# Patient Record
Sex: Male | Born: 1955
Health system: Southern US, Community
[De-identification: ages and names within clinical notes are randomized; demographics above are authoritative.]

## PROBLEM LIST (undated history)

## (undated) DIAGNOSIS — Z950 Presence of cardiac pacemaker: Secondary | ICD-10-CM

## (undated) DIAGNOSIS — Z95810 Presence of automatic (implantable) cardiac defibrillator: Secondary | ICD-10-CM

## (undated) DIAGNOSIS — I2510 Atherosclerotic heart disease of native coronary artery without angina pectoris: Secondary | ICD-10-CM

## (undated) DIAGNOSIS — E785 Hyperlipidemia, unspecified: Secondary | ICD-10-CM

## (undated) DIAGNOSIS — R7303 Prediabetes: Secondary | ICD-10-CM

## (undated) DIAGNOSIS — I480 Paroxysmal atrial fibrillation: Secondary | ICD-10-CM

## (undated) DIAGNOSIS — I5022 Chronic systolic (congestive) heart failure: Secondary | ICD-10-CM

## (undated) DIAGNOSIS — I671 Cerebral aneurysm, nonruptured: Secondary | ICD-10-CM

## (undated) DIAGNOSIS — I509 Heart failure, unspecified: Secondary | ICD-10-CM

## (undated) DIAGNOSIS — I4891 Unspecified atrial fibrillation: Secondary | ICD-10-CM

## (undated) DIAGNOSIS — G4730 Sleep apnea, unspecified: Secondary | ICD-10-CM

## (undated) DIAGNOSIS — M109 Gout, unspecified: Secondary | ICD-10-CM

## (undated) DIAGNOSIS — E119 Type 2 diabetes mellitus without complications: Secondary | ICD-10-CM

## (undated) HISTORY — DX: Hyperlipidemia, unspecified: E78.5

## (undated) HISTORY — DX: Chronic systolic (congestive) heart failure: I50.22

## (undated) HISTORY — PX: TONSILLECTOMY: SUR1361

## (undated) HISTORY — DX: Cerebral aneurysm, nonruptured: I67.1

## (undated) HISTORY — PX: OTHER SURGICAL HISTORY: SHX169

## (undated) HISTORY — PX: CARDIAC CATHETERIZATION: SHX172

## (undated) HISTORY — DX: Atherosclerotic heart disease of native coronary artery without angina pectoris: I25.10

## (undated) HISTORY — PX: MOUTH SURGERY: SHX715

## (undated) HISTORY — DX: Paroxysmal atrial fibrillation: I48.0

## (undated) HISTORY — PX: COLONOSCOPY: SHX174

---
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
## 1999-02-17 ENCOUNTER — Emergency Department (HOSPITAL_COMMUNITY): Admission: EM | Admit: 1999-02-17 | Discharge: 1999-02-17 | Payer: Self-pay | Admitting: Emergency Medicine

## 1999-03-03 ENCOUNTER — Encounter: Admission: RE | Admit: 1999-03-03 | Discharge: 1999-03-23 | Payer: Self-pay | Admitting: *Deleted

## 2011-08-25 ENCOUNTER — Emergency Department (HOSPITAL_COMMUNITY)
Admission: EM | Admit: 2011-08-25 | Discharge: 2011-08-26 | Disposition: A | Discharge status: Home / Self Care | Payer: 59 | Attending: Emergency Medicine | Admitting: Emergency Medicine

## 2011-08-25 ENCOUNTER — Encounter (HOSPITAL_COMMUNITY): Payer: Self-pay | Admitting: Emergency Medicine

## 2011-08-25 DIAGNOSIS — F17200 Nicotine dependence, unspecified, uncomplicated: Secondary | ICD-10-CM | POA: Insufficient documentation

## 2011-08-25 DIAGNOSIS — H5710 Ocular pain, unspecified eye: Secondary | ICD-10-CM | POA: Insufficient documentation

## 2011-08-25 DIAGNOSIS — Z7982 Long term (current) use of aspirin: Secondary | ICD-10-CM | POA: Insufficient documentation

## 2011-08-25 DIAGNOSIS — M79609 Pain in unspecified limb: Secondary | ICD-10-CM | POA: Insufficient documentation

## 2011-08-25 DIAGNOSIS — Z79899 Other long term (current) drug therapy: Secondary | ICD-10-CM | POA: Insufficient documentation

## 2011-08-25 DIAGNOSIS — T304 Corrosion of unspecified body region, unspecified degree: Secondary | ICD-10-CM

## 2011-08-25 DIAGNOSIS — T22219A Burn of second degree of unspecified forearm, initial encounter: Secondary | ICD-10-CM | POA: Insufficient documentation

## 2011-08-25 DIAGNOSIS — T2660XA Corrosion of cornea and conjunctival sac, unspecified eye, initial encounter: Secondary | ICD-10-CM | POA: Insufficient documentation

## 2011-08-25 DIAGNOSIS — T2662XA Corrosion of cornea and conjunctival sac, left eye, initial encounter: Secondary | ICD-10-CM

## 2011-08-25 DIAGNOSIS — T5494XA Toxic effect of unspecified corrosive substance, undetermined, initial encounter: Secondary | ICD-10-CM | POA: Insufficient documentation

## 2011-08-25 DIAGNOSIS — T2661XA Corrosion of cornea and conjunctival sac, right eye, initial encounter: Secondary | ICD-10-CM

## 2011-08-25 MED ORDER — KETOROLAC TROMETHAMINE 0.5 % OP SOLN
1.0000 [drp] | Freq: Once | OPHTHALMIC | Status: AC
  Administered 2011-08-26: 1 [drp] via OPHTHALMIC
  Filled 2011-08-25: qty 3

## 2011-08-25 MED ORDER — FLUORESCEIN SODIUM 1 MG OP STRP
1.0000 | ORAL_STRIP | Freq: Once | OPHTHALMIC | Status: AC
  Administered 2011-08-25: 1 via OPHTHALMIC
  Filled 2011-08-25: qty 1

## 2011-08-25 MED ORDER — OXYCODONE-ACETAMINOPHEN 5-325 MG PO TABS: 2.0000 | ORAL_TABLET | ORAL | Status: AC | PRN

## 2011-08-25 MED ORDER — TETRACAINE HCL 0.5 % OP SOLN
2.0000 [drp] | Freq: Once | OPHTHALMIC | Status: AC
  Administered 2011-08-25: 2 [drp] via OPHTHALMIC
  Filled 2011-08-25: qty 2

## 2011-08-25 MED ORDER — ONDANSETRON 4 MG PO TBDP
8.0000 mg | ORAL_TABLET | Freq: Once | ORAL | Status: AC
  Administered 2011-08-25: 8 mg via ORAL
  Filled 2011-08-25: qty 2

## 2011-08-25 MED ORDER — HYDROMORPHONE HCL PF 1 MG/ML IJ SOLN
2.0000 mg | Freq: Once | INTRAMUSCULAR | Status: AC
  Administered 2011-08-25: 2 mg via INTRAMUSCULAR
  Filled 2011-08-25: qty 2

## 2011-08-25 MED ORDER — SILVER SULFADIAZINE 1 % EX CREA
TOPICAL_CREAM | Freq: Once | CUTANEOUS | Status: AC
  Administered 2011-08-25: via TOPICAL
  Filled 2011-08-25: qty 85
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
## 2011-08-25 NOTE — ED Notes (Signed)
Received pt. From triage, pt. Alert and oriented, pt. Ambulatory gait steady, NAD noted,right eye reddened, right f/a blister noted

## 2014-04-28 ENCOUNTER — Encounter (HOSPITAL_COMMUNITY): Payer: Self-pay | Admitting: Emergency Medicine

## 2014-04-28 ENCOUNTER — Emergency Department (HOSPITAL_COMMUNITY)
Admission: EM | Admit: 2014-04-28 | Discharge: 2014-04-28 | Disposition: A | Discharge status: Home / Self Care | Payer: 59 | Attending: Emergency Medicine | Admitting: Emergency Medicine

## 2014-04-28 DIAGNOSIS — Z7982 Long term (current) use of aspirin: Secondary | ICD-10-CM | POA: Diagnosis not present

## 2014-04-28 DIAGNOSIS — S01501A Unspecified open wound of lip, initial encounter: Secondary | ICD-10-CM | POA: Insufficient documentation

## 2014-04-28 DIAGNOSIS — Z79899 Other long term (current) drug therapy: Secondary | ICD-10-CM | POA: Insufficient documentation

## 2014-04-28 DIAGNOSIS — W010XXA Fall on same level from slipping, tripping and stumbling without subsequent striking against object, initial encounter: Secondary | ICD-10-CM | POA: Diagnosis not present

## 2014-04-28 DIAGNOSIS — Y9289 Other specified places as the place of occurrence of the external cause: Secondary | ICD-10-CM | POA: Diagnosis not present

## 2014-04-28 DIAGNOSIS — S01511A Laceration without foreign body of lip, initial encounter: Secondary | ICD-10-CM

## 2014-04-28 DIAGNOSIS — Y9389 Activity, other specified: Secondary | ICD-10-CM | POA: Insufficient documentation

## 2014-04-28 DIAGNOSIS — F17200 Nicotine dependence, unspecified, uncomplicated: Secondary | ICD-10-CM | POA: Insufficient documentation

## 2014-04-28 MED ORDER — OXYCODONE-ACETAMINOPHEN 5-325 MG PO TABS
1.0000 | ORAL_TABLET | Freq: Once | ORAL | Status: AC
  Administered 2014-04-28: 1 via ORAL
  Filled 2014-04-28: qty 1

## 2014-04-28 MED ORDER — LIDOCAINE HCL (PF) 1 % IJ SOLN: 30.0000 mL | Freq: Once | INTRAMUSCULAR | Status: DC

## 2014-04-28 MED ORDER — ONDANSETRON 4 MG PO TBDP
8.0000 mg | ORAL_TABLET | Freq: Once | ORAL | Status: AC
  Administered 2014-04-28: 8 mg via ORAL
  Filled 2014-04-28: qty 2

## 2014-04-28 NOTE — ED Provider Notes (Addendum)
CSN: [id number]     Arrival date & time 04/28/14  0017 History   First MD Initiated Contact with Patient 04/28/14 0403     Chief Complaint  Patient presents with  . Lip Laceration     (Consider location/radiation/quality/duration/timing/severity/associated sxs/prior Treatment) HPI 58 year old male presents to the emergency department after a fall.  Patient sustained a laceration through and through of his upper lip involving the vermilion border.  He denies LOC.  He denies any dental,.  No other injuries.  He reports his tetanus is up to date. History reviewed. No pertinent past medical history. History reviewed. No pertinent past surgical history. No family history on file. History  Substance Use Topics  . Smoking status: Never Smoker   . Smokeless tobacco: Not on file  . Alcohol Use: Yes     Comment: rarely    Review of Systems  See History of Present Illness; otherwise all other systems are reviewed and negative   Allergies  Review of patient's allergies indicates no known allergies.  Home Medications   Prior to Admission medications   Medication Sig Start Date End Date Taking? Authorizing Provider  aspirin (ASPIRIN EC) 81 MG EC tablet Take 81 mg by mouth daily.   Yes Historical Provider, MD  colchicine 0.6 MG tablet Take 0.6 mg by mouth daily.   Yes Historical Provider, MD  Omega-3 Fatty Acids (FISH OIL PO) Take 1 capsule by mouth daily.   Yes Historical Provider, MD  oxyCODONE-acetaminophen (PERCOCET) 10-325 MG per tablet Take 1 tablet by mouth every 4 (four) hours as needed. For pain   Yes Historical Provider, MD  Pyridoxine HCl (VITAMIN B-6 PO) Take 1 tablet by mouth daily.   Yes Historical Provider, MD  temazepam (RESTORIL) 30 MG capsule Take 30 mg by mouth at bedtime as needed for sleep.   Yes Historical Provider, MD  TURMERIC PO Take 1 capsule by mouth daily.   Yes Historical Provider, MD   BP 122/81  Pulse 84  Temp(Src) 98.7 F (37.1 C) (Oral)  Resp 16  Ht 6'  1" (1.854 m)  Wt 285 lb (129.275 kg)  BMI 37.61 kg/m2  SpO2 95% Physical Exam  Nursing note and vitals reviewed. Constitutional: He is oriented to person, place, and time. He appears well-developed and well-nourished. No distress.  HENT:  Head: Normocephalic.  Right Ear: External ear normal.  Left Ear: External ear normal.  Nose: Nose normal.  Mouth/Throat: Oropharynx is clear and moist. No oropharyngeal exudate.  2 cm laceration  Eyes: Conjunctivae and EOM are normal. Pupils are equal, round, and reactive to light.  Cardiovascular: Normal rate, regular rhythm, normal heart sounds and intact distal pulses.  Exam reveals no gallop and no friction rub.   No murmur heard. Pulmonary/Chest: Effort normal and breath sounds normal. No respiratory distress. He has no wheezes. He has no rales. He exhibits no tenderness.  Neurological: He is alert and oriented to person, place, and time. He exhibits normal muscle tone. Coordination normal.  Skin: Skin is warm and dry. No rash noted. He is not diaphoretic. No erythema. No pallor.    ED Course  Procedures (including critical care time) Labs Review Labs Reviewed - No data to display  Imaging Review No results found.   EKG Interpretation None     LACERATION REPAIR Performed by: Olivia Mackie Authorized by: Olivia Mackie Consent: Verbal consent obtained. Risks and benefits: risks, benefits and alternatives were discussed Consent given by: patient Patient identity confirmed: provided demographic data Prepped  and Draped in normal sterile fashion Wound explored  Laceration Location: midline upper lip through vermilion border  Laceration Length: 2cm  No Foreign Bodies seen or palpated  Anesthesia: local infiltration  Local anesthetic: lidocaine 1% without epinephrine  Anesthetic total: 4 ml  Irrigation method: syringe Amount of cleaning: standard  Skin closure: 5.0 vicryl  Number of sutures: 6  Technique: 1 horizontal  mattress internal, 5 external simple interrupted  Patient tolerance: Patient tolerated the procedure well with no immediate complications.  MDM   Final diagnoses:  Laceration of lip, complicated, initial encounter    58 year old male status post fall with lip laceration through the vermilion border.  Wound repaired with 5.0 rapid Vicryl.  Good approximation of edges.  Patient given precautions for return.    Olivia Mackie, MD 04/28/14 4742  Olivia Mackie, MD 05/25/14 [phone]
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
## 2014-04-28 NOTE — ED Notes (Signed)
Delay explained to patient 

## 2014-04-28 NOTE — Discharge Instructions (Signed)
Absorbable Suture Repair Absorbable sutures (stitches) hold skin together so you can heal. Keep skin wounds clean and dry for the next 2 to 3 days. Then, you may gently wash your wound and dress it with an antibiotic ointment as recommended. As your wound begins to heal, the sutures are no longer needed, and they typically begin to fall off. This will take 7 to 10 days. After 10 days, if your sutures are loose, you can remove them by wiping with a clean gauze pad or a cotton ball. Do not pull your sutures out. They should wipe away easily. If after 10 days they do not easily wipe away, have your caregiver take them out. Absorbable sutures may be used deep in a wound to help hold it together. If these stitches are below the skin, the body will absorb them completely in 3 to 4 weeks.  You may need a tetanus shot if:  You cannot remember when you had your last tetanus shot.  You have never had a tetanus shot. If you get a tetanus shot, your arm may swell, get red, and feel warm to the touch. This is common and not a problem. If you need a tetanus shot and you choose not to have one, there is a rare chance of getting tetanus. Sickness from tetanus can be serious. SEEK IMMEDIATE MEDICAL CARE IF:  You have redness in the wound area.  The wound area feels hot to the touch.  You develop swelling in the wound area.  You develop pain.  There is fluid drainage from the wound. Document Released: 09/09/2004 Document Revised: 10/25/2011 Document Reviewed: 12/22/2010 [facility] Patient Information 2015 [place], [place]. This information is not intended to replace advice given to you by your health care provider. Make sure you discuss any questions you have with your health care provider.   If your sutures are not loose and coming out in 5-7 days, please see your doctor, urgent care, or the ER for removal.  Topical antibiotics will help with healing and scarring.    Sutured Wound Care Sutures are stitches  that can be used to close wounds. Caring for your wound can help stop infection and lessen pain. HOME CARE   Rest and raise (elevate) the injured area until the pain and puffiness (swelling) go away.  Only take medicines as told by your doctor.  Clean the wound gently with mild soap and water once a day after the first 2 days. Rinse off the soap. Pat the area dry with a clean towel. Do not rub the wound.  Change the bandage (dressing) as told by your doctor. If the bandage sticks, soak it off with soapy water. Stop using a bandage after 2 days or after the wound stops leaking fluid.  Put cream on the wound as told by your doctor.  Do not stretch the wound.  Drink enough fluids to keep your pee (urine) clear or pale yellow.  See your doctor to have the sutures removed.  Use sunscreen or sunblock on the wound after it heals. GET HELP RIGHT AWAY IF:   Your wound gets red, puffy, hot, or tender.  You have more pain in the wound.  You have a red streak that goes away from the wound.  You see yellowish-white fluid (pus) coming out of the wound.  You have a fever.  You have chills and start to shake.  You notice a bad smell coming from the wound.  Your wound will not stop bleeding.  MAKE SURE YOU:   Understand these instructions.  Will watch your condition.  Will get help right away if you are not doing well or get worse. Document Released: 01/19/2008 Document Revised: 10/25/2011 Document Reviewed: 12/06/2010 [facility] Patient Information 2015 [place], [place]. This information is not intended to replace advice given to you by your health care provider. Make sure you discuss any questions you have with your health care provider.

## 2014-05-25 ENCOUNTER — Encounter (HOSPITAL_COMMUNITY): Payer: Self-pay | Admitting: Emergency Medicine

## 2015-03-31 ENCOUNTER — Other Ambulatory Visit: Payer: Self-pay

## 2015-03-31 DIAGNOSIS — K439 Ventral hernia without obstruction or gangrene: Secondary | ICD-10-CM

## 2015-03-31 DIAGNOSIS — K436 Other and unspecified ventral hernia with obstruction, without gangrene: Secondary | ICD-10-CM

## 2015-04-08 ENCOUNTER — Ambulatory Visit
Admission: RE | Admit: 2015-04-08 | Discharge: 2015-04-08 | Disposition: A | Discharge status: Home / Self Care | Payer: 59 | Source: Ambulatory Visit | Attending: General Surgery | Admitting: General Surgery

## 2015-04-08 IMAGING — CT CT ABD-PELV W/ CM
3 of 5 series · 12 of 36 positions shown, 18 images · IV contrast (READICAT/WATER & [ID] ISOVUE 300)
Comparison: None.

CLINICAL DATA: 59-year-old male with palpable abnormality of the
ventral abdomen discovered 7 years ago. Query hernia. Initial
encounter.

EXAM:
CT ABDOMEN AND PELVIS WITH CONTRAST
TECHNIQUE: Multidetector CT imaging of the abdomen and pelvis was performed
using the standard protocol following bolus administration of
intravenous contrast.
CONTRAST:  125mL [TD] IOPAMIDOL ([TD]) INJECTION 61%

[Series 3: abd/pelvis with · axial · 0.83mm/px · z∈[-377,-47]mm · 7 of 90 slices shown, 12 images]
[im 12/90  soft-tissue]
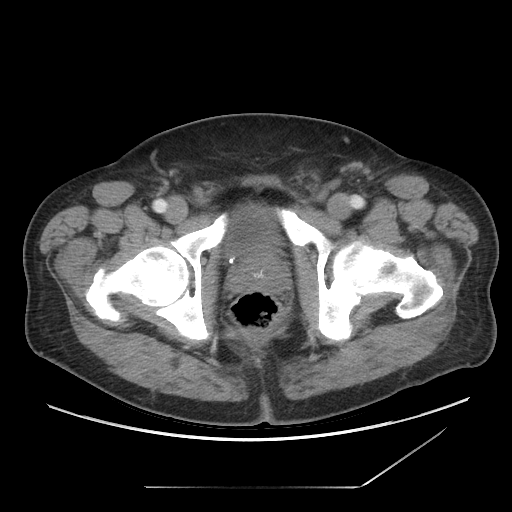
[im 12/90  bone]
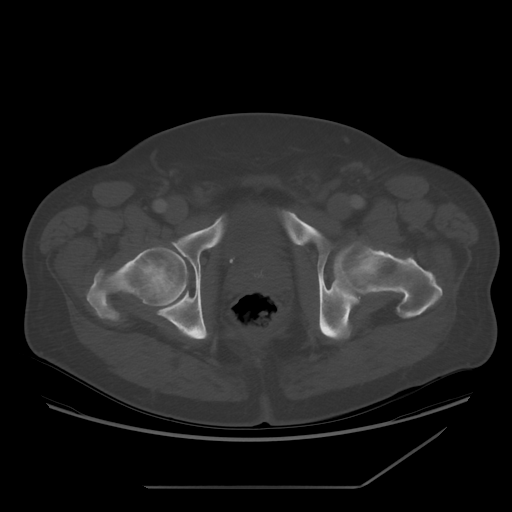
[im 23/90  soft-tissue]
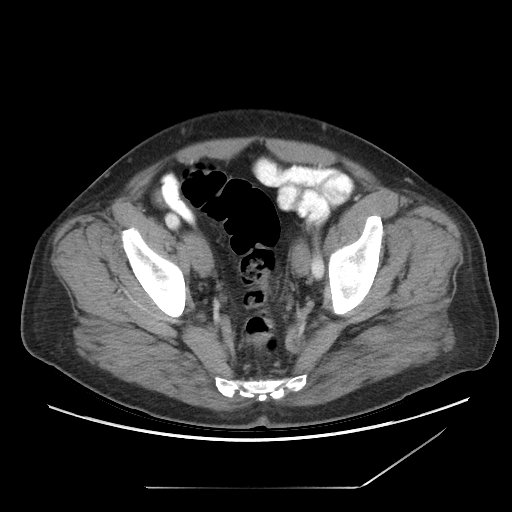
[im 34/90  soft-tissue]
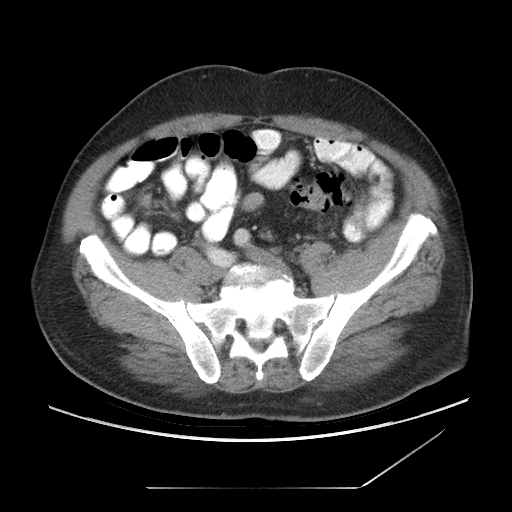
[im 45/90  soft-tissue]
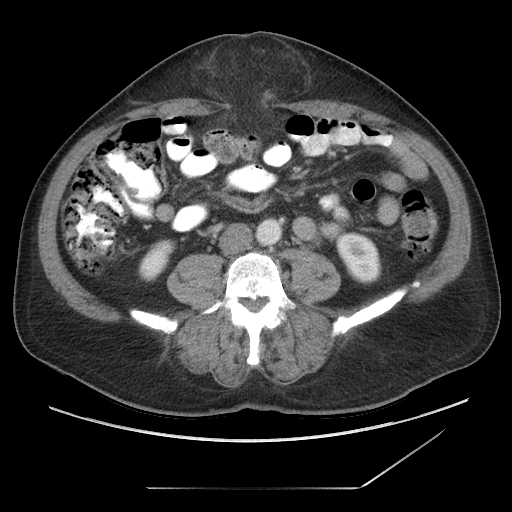
[im 45/90  lung]
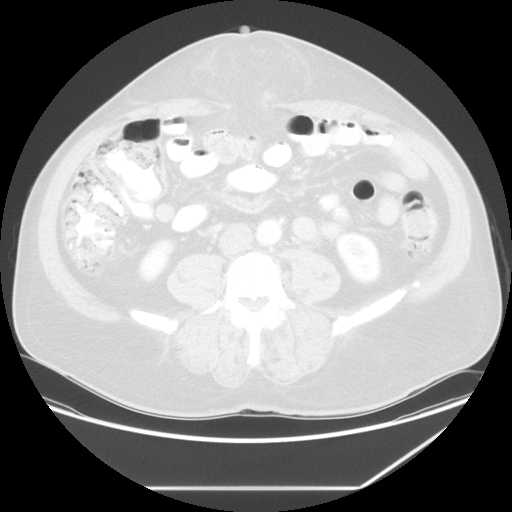
[im 56/90  soft-tissue]
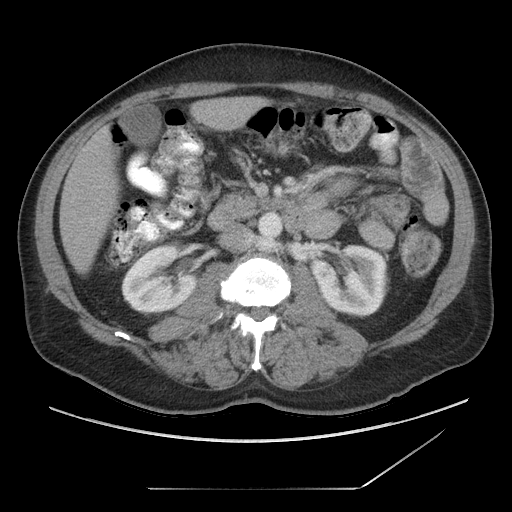
[im 56/90  lung]
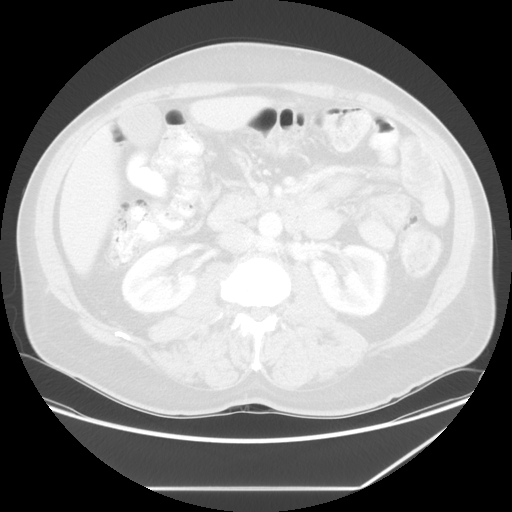
[im 67/90  soft-tissue]
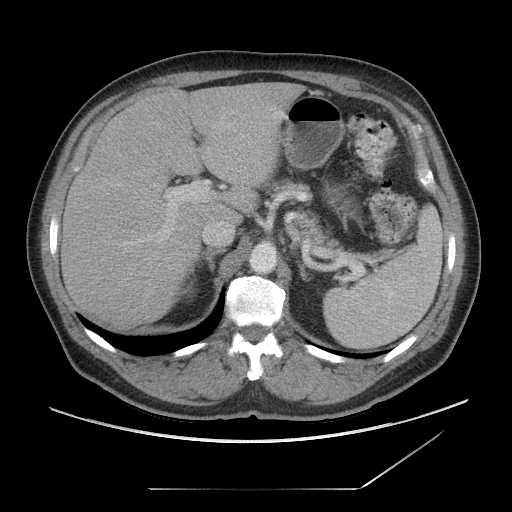
[im 67/90  lung]
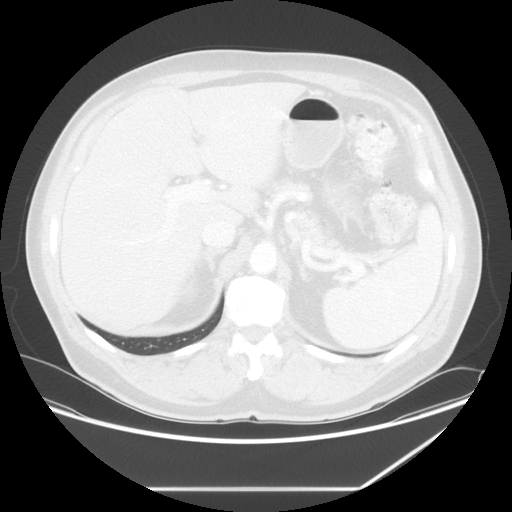
[im 78/90  soft-tissue]
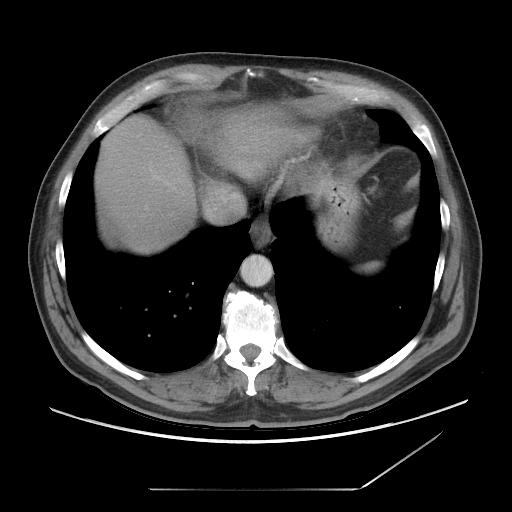
[im 78/90  lung]
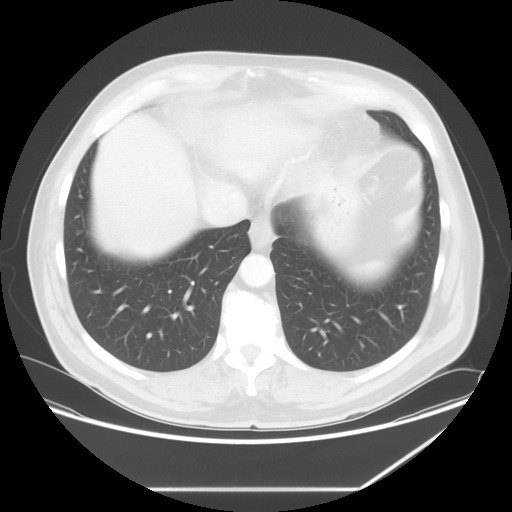

[Series 601: coronal body · coronal · 0.91mm/px · 1 of 137 slices shown, 2 images]
[im 46/137  soft-tissue]
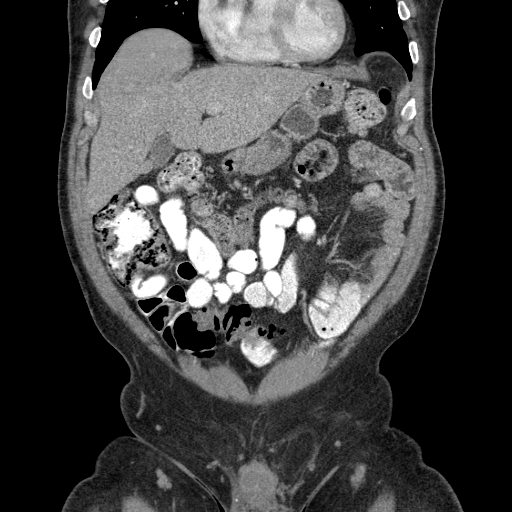
[im 46/137  bone]
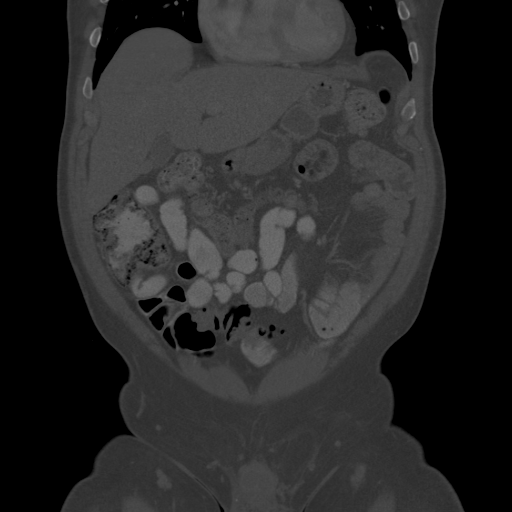

[Series 602: sagittal body · sagittal · 0.91mm/px · 4 of 169 slices shown]
[im 11/169  soft-tissue]
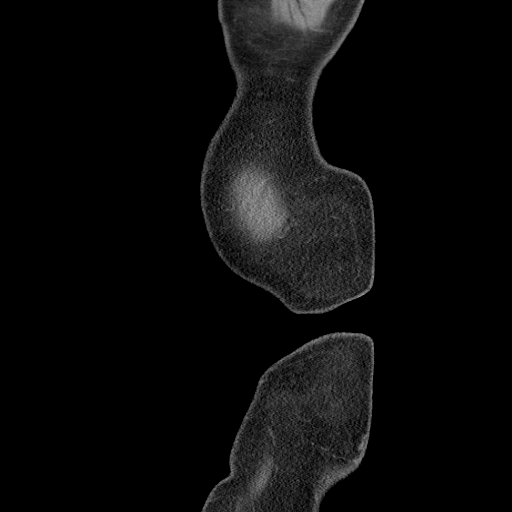
[im 32/169  soft-tissue]
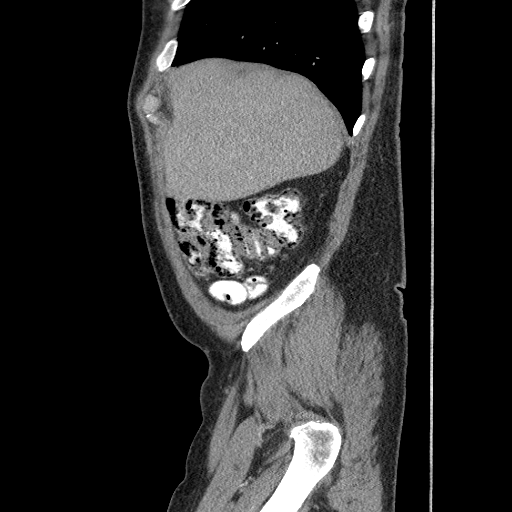
[im 53/169  soft-tissue]
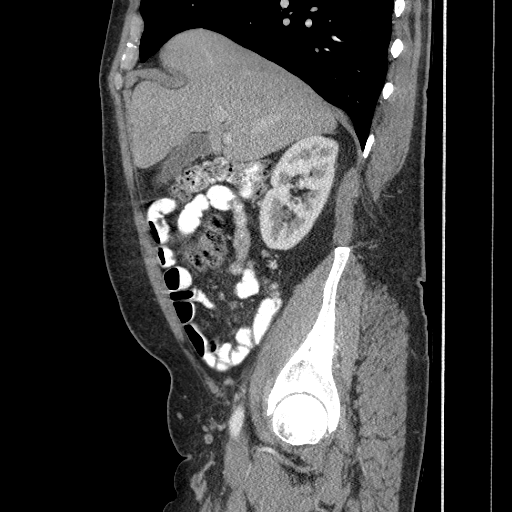
[im 74/169  soft-tissue]
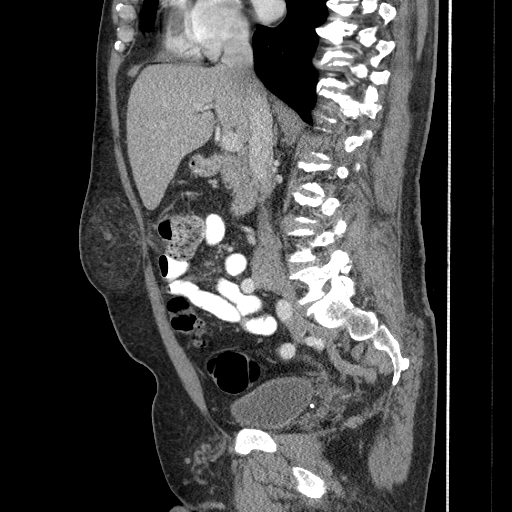

[12 of 36 positions shown; findings below may reference images not displayed]

FINDINGS: Negative lung bases.  No pericardial or pleural effusion.

Intermittent disc and endplate degeneration in the spine. Lower
lumbar facet degeneration. Multifactorial degenerative spinal
stenosis at L2-L3. No acute osseous abnormality identified.

No pelvic free fluid. Numerous pelvic phleboliths. Negative rectum
and urinary bladder.

Redundant sigmoid colon tracks into the right lower quadrant.
Moderate to severe sigmoid diverticulosis which continues into the
descending colon as far as the splenic flexure. No associated active
inflammation. Retained stool throughout much of the colon today.
Oral contrast has reached the hepatic flexure. The cecum is on a lax
mesentery. Negative terminal ileum. An appendix is not identified.
No dilated small bowel. Decompressed stomach and duodenum.

There is a small fat containing left inguinal hernia (series 3,
image 86) tracking toward the left scrotum. Arising about 2 cm
cephalad to the umbilicus there is a mushroom shaped ventral
abdominal hernia containing only mesentery. This has a hernia neck
of about 3 cm, and encompasses 6.2 x 10.8 by 9.5 cm (AP by
transverse by CC). No associated inflammation. No other ventral
abdominal wall abnormality.

Tiny calcified granuloma in the left hepatic lobe. Borderline
decreased liver density such as can be seen with steatosis.
Gallbladder, spleen (calcified granuloma in the superior pole),
pancreas, and adrenal glands are within normal limits. The portal
venous system is patent. Major arterial structures are patent. Mild
to moderate iliac artery ectasia. Bilateral renal enhancement and
contrast excretion is within normal limits. There are small
retroperitoneal lymph nodes, maximal at the iliac artery
bifurcations, which are within normal limits by size criteria. No
lymphadenopathy elsewhere.
IMPRESSION: 1. Mushroom shaped midline ventral abdominal hernia containing
mesentery, arising just above the emboli kiss and encompassing 6.2 x
10.8 x 9.5 cm. Small fat containing left inguinal hernia.
2. Goal no acute or inflammatory process identified. Moderate to
severe diverticulosis of the distal colon. Borderline to mild
hepatic steatosis. Chronic spine degeneration.

## 2015-04-08 MED ORDER — IOPAMIDOL (ISOVUE-300) INJECTION 61%
125.0000 mL | Freq: Once | INTRAVENOUS | Status: AC | PRN
  Administered 2015-04-08: 125 mL via INTRAVENOUS

## 2015-06-18 ENCOUNTER — Ambulatory Visit: Payer: Self-pay | Admitting: General Surgery

## 2015-06-20 NOTE — Progress Notes (Signed)
Abbreviation noted per sugical consent. Please clarify. Patient has preop appt scheduled for Monday 06/23/2015. Thanks.

## 2015-06-23 ENCOUNTER — Encounter (HOSPITAL_COMMUNITY): Payer: Self-pay

## 2015-06-23 ENCOUNTER — Encounter (HOSPITAL_COMMUNITY)
Admission: RE | Admit: 2015-06-23 | Discharge: 2015-06-23 | Disposition: A | Discharge status: Home / Self Care | Payer: 59 | Source: Ambulatory Visit | Attending: General Surgery | Admitting: General Surgery

## 2015-06-23 DIAGNOSIS — K439 Ventral hernia without obstruction or gangrene: Secondary | ICD-10-CM | POA: Insufficient documentation

## 2015-06-23 DIAGNOSIS — Z01818 Encounter for other preprocedural examination: Secondary | ICD-10-CM | POA: Diagnosis not present

## 2015-06-23 HISTORY — DX: Gout, unspecified: M10.9

## 2015-06-23 LAB — CBC
HEMATOCRIT: 44.1 % (ref 39.0–52.0)
Hemoglobin: 14.4 g/dL (ref 13.0–17.0)
MCH: 31.3 pg (ref 26.0–34.0)
MCHC: 32.7 g/dL (ref 30.0–36.0)
MCV: 95.9 fL (ref 78.0–100.0)
Platelets: 248 10*3/uL (ref 150–400)
RBC: 4.6 MIL/uL (ref 4.22–5.81)
RDW: 14.2 % (ref 11.5–15.5)
WBC: 8.4 10*3/uL (ref 4.0–10.5)

## 2015-06-23 LAB — BASIC METABOLIC PANEL
ANION GAP: 7 (ref 5–15)
BUN: 20 mg/dL (ref 6–20)
CALCIUM: 9.1 mg/dL (ref 8.9–10.3)
CO2: 27 mmol/L (ref 22–32)
Chloride: 104 mmol/L (ref 101–111)
Creatinine, Ser: 0.78 mg/dL (ref 0.61–1.24)
Glucose, Bld: 112 mg/dL — ABNORMAL HIGH (ref 65–99)
POTASSIUM: 4.3 mmol/L (ref 3.5–5.1)
Sodium: 138 mmol/L (ref 135–145)

## 2015-06-23 NOTE — Progress Notes (Signed)
Abbreviations noted per sugical consent. Please clarify. Thanks.

## 2015-06-23 NOTE — Patient Instructions (Signed)
Richard Graham  06/23/2015   Your procedure is scheduled on: Friday June 27, 2015  [facility]  [place]ance [place]  elevators to 3rd floor to  Short Stay Center at 5:00 AM.  Call this number if you have problems the morning of surgery [phone]   Remember: ONLY 1 PERSON MAY GO WITH YOU TO SHORT STAY TO GET  READY MORNING OF YOUR SURGERY.  Do not eat food or drink liquids :After Midnight.     Take these medicines the morning of surgery with A SIP OF WATER: Oxycodone-Acetaminophen if needed                                You may not have any metal on your body including hair pins and              piercings  Do not wear jewelry, lotions, powders or colognes, deodorant             Men may shave face and neck.   Do not bring valuables to the hospital. [facility] IS NOT             RESPONSIBLE   FOR VALUABLES.  Contacts, dentures or bridgework may not be worn into surgery.      Patients discharged the day of surgery will not be allowed to drive home.  Name and phone number of your driver:Emelda FearMary Elizabeth (Beth) Madruga (wife)  _____________________________________________________________________             [facility] - Preparing for [facility] Before surgery, you can play an important role.  Because skin is not sterile, your skin needs to be as free of germs as possible.  You can reduce the number of germs on your skin by washing with CHG (chlorahexidine gluconate) soap before surgery.  CHG is an antiseptic cleaner which kills germs and bonds with the skin to continue killing germs even after washing. Please DO NOT use if you have an allergy to CHG or antibacterial soaps.  If your skin becomes reddened/irritated stop using the CHG and inform your nurse when you arrive at Short Stay. Do not shave (including legs and underarms) for at least 48 hours prior to the first CHG shower.  You may shave your face/neck. Please follow these instructions  carefully:  1.  Shower with CHG Soap the night before surgery and the  morning of Surgery.  2.  If you choose to wash your hair, wash your hair first as usual with your  normal  shampoo.  3.  After you shampoo, rinse your hair and body thoroughly to remove the  shampoo.                           4.  Use CHG as you would any other liquid soap.  You can apply chg directly  to the skin and wash                       Gently with a scrungie or clean washcloth.  5.  Apply the CHG Soap to your body ONLY FROM THE NECK DOWN.   Do not use on face/ open  Wound or open sores. Avoid contact with eyes, ears mouth and genitals (private parts).                       Wash face,  Genitals (private parts) with your normal soap.             6.  Wash thoroughly, paying special attention to the area where your surgery  will be performed.  7.  Thoroughly rinse your body with warm water from the neck down.  8.  DO NOT shower/wash with your normal soap after using and rinsing off  the CHG Soap.                9.  Pat yourself dry with a clean towel.            10.  Wear clean pajamas.            11.  Place clean sheets on your bed the night of your first shower and do not  sleep with pets. Day of Surgery : Do not apply any lotions/deodorants the morning of surgery.  Please wear clean clothes to the hospital/surgery center.  FAILURE TO FOLLOW THESE INSTRUCTIONS MAY RESULT IN THE CANCELLATION OF YOUR SURGERY PATIENT SIGNATURE_________________________________  NURSE SIGNATURE__________________________________  ________________________________________________________________________

## 2015-06-26 ENCOUNTER — Encounter (HOSPITAL_COMMUNITY): Payer: Self-pay | Admitting: Anesthesiology

## 2015-06-26 NOTE — Anesthesia Preprocedure Evaluation (Addendum)
Anesthesia Evaluation  Patient identified by MRN, date of birth, ID band Patient awake    Reviewed: Allergy & Precautions, NPO status , Patient's Chart, lab work & pertinent test results  Airway Mallampati: II  TM Distance: >3 FB Neck ROM: Full    Dental no notable dental hx.    Pulmonary former smoker,    Pulmonary exam normal breath sounds clear to auscultation       Cardiovascular negative cardio ROS Normal cardiovascular exam Rhythm:Regular Rate:Normal     Neuro/Psych negative neurological ROS  negative psych ROS   GI/Hepatic negative GI ROS, Neg liver ROS,   Endo/Other  negative endocrine ROS  Renal/GU negative Renal ROS  negative genitourinary   Musculoskeletal negative musculoskeletal ROS (+)   Abdominal   Peds negative pediatric ROS (+)  Hematology negative hematology ROS (+)   Anesthesia Other Findings   Reproductive/Obstetrics negative OB ROS                             Anesthesia Physical Anesthesia Plan  ASA: II  Anesthesia Plan: General   Post-op Pain Management:    Induction: Intravenous  Airway Management Planned: Oral ETT  Additional Equipment:   Intra-op Plan:   Post-operative Plan: Extubation in OR  Informed Consent: I have reviewed the patients History and Physical, chart, labs and discussed the procedure including the risks, benefits and alternatives for the proposed anesthesia with the patient or authorized representative who has indicated his/her understanding and acceptance.   Dental advisory given  Plan Discussed with: CRNA  Anesthesia Plan Comments:         Anesthesia Quick Evaluation  

## 2015-06-27 ENCOUNTER — Encounter (HOSPITAL_COMMUNITY): Payer: Self-pay | Admitting: *Deleted

## 2015-06-27 ENCOUNTER — Ambulatory Visit (HOSPITAL_COMMUNITY)
Admission: RE | Admit: 2015-06-27 | Discharge: 2015-06-27 | Disposition: A | Discharge status: Home / Self Care | Payer: 59 | Source: Ambulatory Visit | Attending: General Surgery | Admitting: General Surgery

## 2015-06-27 ENCOUNTER — Ambulatory Visit (HOSPITAL_COMMUNITY): Payer: 59 | Admitting: Anesthesiology

## 2015-06-27 ENCOUNTER — Encounter (HOSPITAL_COMMUNITY)
Admission: RE | Disposition: A | Discharge status: Home / Self Care | Payer: Self-pay | Source: Ambulatory Visit | Attending: General Surgery

## 2015-06-27 DIAGNOSIS — Z87891 Personal history of nicotine dependence: Secondary | ICD-10-CM | POA: Insufficient documentation

## 2015-06-27 DIAGNOSIS — K436 Other and unspecified ventral hernia with obstruction, without gangrene: Secondary | ICD-10-CM | POA: Insufficient documentation

## 2015-06-27 HISTORY — PX: VENTRAL HERNIA REPAIR: SHX424

## 2015-06-27 HISTORY — PX: INSERTION OF MESH: SHX5868

## 2015-06-27 SURGERY — REPAIR, HERNIA, VENTRAL, LAPAROSCOPIC
Anesthesia: General | Site: Abdomen

## 2015-06-27 MED ORDER — PROMETHAZINE HCL 25 MG/ML IJ SOLN: 6.2500 mg | INTRAMUSCULAR | Status: DC | PRN

## 2015-06-27 MED ORDER — HYDROMORPHONE HCL 1 MG/ML IJ SOLN
INTRAMUSCULAR | Status: AC
  Filled 2015-06-27: qty 1

## 2015-06-27 MED ORDER — MIDAZOLAM HCL 2 MG/2ML IJ SOLN
0.5000 mg | Freq: Once | INTRAMUSCULAR | Status: AC | PRN
  Administered 2015-06-27 (×2): 1 mg via INTRAVENOUS

## 2015-06-27 MED ORDER — BUPIVACAINE-EPINEPHRINE 0.25% -1:200000 IJ SOLN
INTRAMUSCULAR | Status: DC | PRN
  Administered 2015-06-27: 15 mL

## 2015-06-27 MED ORDER — MIDAZOLAM HCL 2 MG/2ML IJ SOLN
INTRAMUSCULAR | Status: AC
  Filled 2015-06-27: qty 4

## 2015-06-27 MED ORDER — SUGAMMADEX SODIUM 200 MG/2ML IV SOLN
INTRAVENOUS | Status: DC | PRN
  Administered 2015-06-27: 200 mg via INTRAVENOUS

## 2015-06-27 MED ORDER — ACETAMINOPHEN 10 MG/ML IV SOLN
1000.0000 mg | Freq: Once | INTRAVENOUS | Status: AC
  Administered 2015-06-27: 1000 mg via INTRAVENOUS

## 2015-06-27 MED ORDER — SODIUM CHLORIDE 0.9 % IJ SOLN: 3.0000 mL | INTRAMUSCULAR | Status: DC | PRN

## 2015-06-27 MED ORDER — FENTANYL CITRATE (PF) 100 MCG/2ML IJ SOLN
INTRAMUSCULAR | Status: AC
  Filled 2015-06-27: qty 2

## 2015-06-27 MED ORDER — ROCURONIUM BROMIDE 100 MG/10ML IV SOLN
INTRAVENOUS | Status: AC
  Filled 2015-06-27: qty 1

## 2015-06-27 MED ORDER — ACETAMINOPHEN 10 MG/ML IV SOLN
INTRAVENOUS | Status: AC
  Filled 2015-06-27: qty 100

## 2015-06-27 MED ORDER — CHLORHEXIDINE GLUCONATE 4 % EX LIQD: 1.0000 "application " | Freq: Once | CUTANEOUS | Status: DC

## 2015-06-27 MED ORDER — ONDANSETRON HCL 4 MG/2ML IJ SOLN
INTRAMUSCULAR | Status: DC | PRN
  Administered 2015-06-27: 4 mg via INTRAVENOUS

## 2015-06-27 MED ORDER — MORPHINE SULFATE (PF) 10 MG/ML IV SOLN: 2.0000 mg | INTRAVENOUS | Status: DC | PRN

## 2015-06-27 MED ORDER — OXYCODONE-ACETAMINOPHEN 5-325 MG PO TABS: 1.0000 | ORAL_TABLET | ORAL | Status: DC | PRN

## 2015-06-27 MED ORDER — SODIUM CHLORIDE 0.9 % IJ SOLN: 3.0000 mL | Freq: Two times a day (BID) | INTRAMUSCULAR | Status: DC

## 2015-06-27 MED ORDER — SODIUM CHLORIDE 0.9 % IV SOLN: 250.0000 mL | INTRAVENOUS | Status: DC | PRN

## 2015-06-27 MED ORDER — SUCCINYLCHOLINE CHLORIDE 20 MG/ML IJ SOLN
INTRAMUSCULAR | Status: DC | PRN
  Administered 2015-06-27: 100 mg via INTRAVENOUS

## 2015-06-27 MED ORDER — ACETAMINOPHEN 650 MG RE SUPP
650.0000 mg | RECTAL | Status: DC | PRN
  Filled 2015-06-27: qty 1

## 2015-06-27 MED ORDER — OXYCODONE HCL 5 MG PO TABS
5.0000 mg | ORAL_TABLET | ORAL | Status: DC | PRN
  Administered 2015-06-27: 10 mg via ORAL
  Filled 2015-06-27: qty 2

## 2015-06-27 MED ORDER — PROPOFOL 10 MG/ML IV BOLUS
INTRAVENOUS | Status: AC
  Filled 2015-06-27: qty 20

## 2015-06-27 MED ORDER — MIDAZOLAM HCL 5 MG/5ML IJ SOLN
INTRAMUSCULAR | Status: DC | PRN
  Administered 2015-06-27: 2 mg via INTRAVENOUS

## 2015-06-27 MED ORDER — LIDOCAINE HCL (CARDIAC) 20 MG/ML IV SOLN
INTRAVENOUS | Status: AC
  Filled 2015-06-27: qty 5

## 2015-06-27 MED ORDER — ROCURONIUM BROMIDE 100 MG/10ML IV SOLN
INTRAVENOUS | Status: DC | PRN
  Administered 2015-06-27: 40 mg via INTRAVENOUS

## 2015-06-27 MED ORDER — ONDANSETRON HCL 4 MG/2ML IJ SOLN
INTRAMUSCULAR | Status: AC
  Filled 2015-06-27: qty 2

## 2015-06-27 MED ORDER — SUGAMMADEX SODIUM 200 MG/2ML IV SOLN
INTRAVENOUS | Status: AC
  Filled 2015-06-27: qty 2

## 2015-06-27 MED ORDER — FENTANYL CITRATE (PF) 100 MCG/2ML IJ SOLN
25.0000 ug | INTRAMUSCULAR | Status: DC | PRN
  Administered 2015-06-27: 50 ug via INTRAVENOUS
  Administered 2015-06-27: 25 ug via INTRAVENOUS

## 2015-06-27 MED ORDER — 0.9 % SODIUM CHLORIDE (POUR BTL) OPTIME
TOPICAL | Status: DC | PRN
  Administered 2015-06-27: 1000 mL

## 2015-06-27 MED ORDER — DEXAMETHASONE SODIUM PHOSPHATE 10 MG/ML IJ SOLN
INTRAMUSCULAR | Status: AC
  Filled 2015-06-27: qty 1

## 2015-06-27 MED ORDER — FENTANYL CITRATE (PF) 100 MCG/2ML IJ SOLN
INTRAMUSCULAR | Status: AC
  Filled 2015-06-27: qty 4

## 2015-06-27 MED ORDER — FENTANYL CITRATE (PF) 100 MCG/2ML IJ SOLN
INTRAMUSCULAR | Status: DC | PRN
  Administered 2015-06-27 (×2): 50 ug via INTRAVENOUS
  Administered 2015-06-27 (×2): 100 ug via INTRAVENOUS
  Administered 2015-06-27: 50 ug via INTRAVENOUS

## 2015-06-27 MED ORDER — LACTATED RINGERS IV SOLN
INTRAVENOUS | Status: DC | PRN
  Administered 2015-06-27 (×2): via INTRAVENOUS

## 2015-06-27 MED ORDER — PROPOFOL 10 MG/ML IV BOLUS
INTRAVENOUS | Status: DC | PRN
  Administered 2015-06-27: 200 mg via INTRAVENOUS

## 2015-06-27 MED ORDER — OXYCODONE-ACETAMINOPHEN 10-325 MG PO TABS: 1.0000 | ORAL_TABLET | ORAL | Status: DC | PRN

## 2015-06-27 MED ORDER — BUPIVACAINE-EPINEPHRINE 0.25% -1:200000 IJ SOLN
INTRAMUSCULAR | Status: AC
  Filled 2015-06-27: qty 1

## 2015-06-27 MED ORDER — HYDROMORPHONE HCL 1 MG/ML IJ SOLN
0.2500 mg | INTRAMUSCULAR | Status: DC | PRN
  Administered 2015-06-27: 0.25 mg via INTRAVENOUS
  Administered 2015-06-27 (×3): 0.5 mg via INTRAVENOUS
  Administered 2015-06-27: 0.25 mg via INTRAVENOUS

## 2015-06-27 MED ORDER — LIDOCAINE HCL (CARDIAC) 20 MG/ML IV SOLN
INTRAVENOUS | Status: DC | PRN
  Administered 2015-06-27: 100 mg via INTRAVENOUS

## 2015-06-27 MED ORDER — FENTANYL CITRATE (PF) 250 MCG/5ML IJ SOLN
INTRAMUSCULAR | Status: AC
  Filled 2015-06-27: qty 25

## 2015-06-27 MED ORDER — DEXAMETHASONE SODIUM PHOSPHATE 10 MG/ML IJ SOLN
INTRAMUSCULAR | Status: DC | PRN
  Administered 2015-06-27: 10 mg via INTRAVENOUS

## 2015-06-27 MED ORDER — KETOROLAC TROMETHAMINE 30 MG/ML IJ SOLN
30.0000 mg | Freq: Once | INTRAMUSCULAR | Status: AC
  Administered 2015-06-27: 30 mg via INTRAVENOUS
  Filled 2015-06-27: qty 1

## 2015-06-27 MED ORDER — MIDAZOLAM HCL 2 MG/2ML IJ SOLN
INTRAMUSCULAR | Status: AC
  Filled 2015-06-27: qty 2

## 2015-06-27 MED ORDER — CEFAZOLIN SODIUM-DEXTROSE 2-3 GM-% IV SOLR
2.0000 g | INTRAVENOUS | Status: AC
  Administered 2015-06-27: 2 g via INTRAVENOUS

## 2015-06-27 MED ORDER — ACETAMINOPHEN 325 MG PO TABS: 650.0000 mg | ORAL_TABLET | ORAL | Status: DC | PRN

## 2015-06-27 MED ORDER — CEFAZOLIN SODIUM-DEXTROSE 2-3 GM-% IV SOLR
INTRAVENOUS | Status: AC
  Filled 2015-06-27: qty 50

## 2015-06-27 SURGICAL SUPPLY — 37 items
APL SKNCLS STERI-STRIP NONHPOA (GAUZE/BANDAGES/DRESSINGS) ×1
APPLIER CLIP 5 13 M/L LIGAMAX5 (MISCELLANEOUS)
APR CLP MED LRG 5 ANG JAW (MISCELLANEOUS)
BENZOIN TINCTURE PRP APPL 2/3 (GAUZE/BANDAGES/DRESSINGS) ×2 IMPLANT
BINDER ABDOMINAL 12 ML 46-62 (SOFTGOODS) ×2 IMPLANT
CABLE HIGH FREQUENCY MONO STRZ (ELECTRODE) ×2 IMPLANT
CHLORAPREP W/TINT 26ML (MISCELLANEOUS) ×2 IMPLANT
CLIP APPLIE 5 13 M/L LIGAMAX5 (MISCELLANEOUS) IMPLANT
COVER SURGICAL LIGHT HANDLE (MISCELLANEOUS) ×2 IMPLANT
DECANTER SPIKE VIAL GLASS SM (MISCELLANEOUS) ×2 IMPLANT
DEVICE SECURE STRAP 25 ABSORB (INSTRUMENTS) ×4 IMPLANT
DEVICE TROCAR PUNCTURE CLOSURE (ENDOMECHANICALS) ×2 IMPLANT
DRAPE LAPAROSCOPIC ABDOMINAL (DRAPES) ×2 IMPLANT
ELECT REM PT RETURN 9FT ADLT (ELECTROSURGICAL) ×2
ELECTRODE REM PT RTRN 9FT ADLT (ELECTROSURGICAL) ×1 IMPLANT
GLOVE BIO SURGEON STRL SZ7.5 (GLOVE) ×2 IMPLANT
GOWN STRL REUS W/TWL XL LVL3 (GOWN DISPOSABLE) ×6 IMPLANT
KIT BASIN OR (CUSTOM PROCEDURE TRAY) ×2 IMPLANT
MESH VENTRALIGHT ST 6IN CRC (Mesh General) ×2 IMPLANT
NEEDLE INSUFFLATION 14GA 120MM (NEEDLE) ×2 IMPLANT
PEN SKIN MARKING BROAD (MISCELLANEOUS) ×2 IMPLANT
SCISSORS LAP 5X35 DISP (ENDOMECHANICALS) ×2 IMPLANT
SET IRRIG TUBING LAPAROSCOPIC (IRRIGATION / IRRIGATOR) IMPLANT
SHEARS HARMONIC ACE PLUS 36CM (ENDOMECHANICALS) IMPLANT
SLEEVE XCEL OPT CAN 5 100 (ENDOMECHANICALS) ×4 IMPLANT
STRIP CLOSURE SKIN 1/2X4 (GAUZE/BANDAGES/DRESSINGS) ×2 IMPLANT
SUT CHROMIC 2 0 SH (SUTURE) ×2 IMPLANT
SUT MNCRL AB 4-0 PS2 18 (SUTURE) ×2 IMPLANT
SUT NOVA 0 T19/GS 22DT (SUTURE) ×2 IMPLANT
SUT PDS AB 0 CT1 36 (SUTURE) IMPLANT
SUT PDS AB 1 CT1 27 (SUTURE) IMPLANT
SUT PROLENE 2 0 KS (SUTURE) ×4 IMPLANT
TOWEL OR 17X26 10 PK STRL BLUE (TOWEL DISPOSABLE) ×2 IMPLANT
TRAY FOLEY W/METER SILVER 14FR (SET/KITS/TRAYS/PACK) IMPLANT
TRAY FOLEY W/METER SILVER 16FR (SET/KITS/TRAYS/PACK) ×2 IMPLANT
TRAY LAPAROSCOPIC (CUSTOM PROCEDURE TRAY) ×2 IMPLANT
TROCAR XCEL NON-BLD 11X100MML (ENDOMECHANICALS) ×2 IMPLANT

## 2015-06-27 NOTE — H&P (Signed)
History of Present Illness Axel Filler(Gal Feldhaus MD; 04/24/2015 11:50 AM) The patient is a 59 year old male who presents with an incisional hernia. Patient is a 59 year old male who is referred by Dr. Johny BlamerWilliam Harris for evaluation of a ventral hernia. Patient underwent CT scan which revealed a hernia, incarcerated with a hernia neck for approximately 3.5 cm.    Review of Systems Axel Filler(Isella Slatten, MD; 04/24/2015 11:49 00) General Not Present- Appetite Loss, Chills, Fatigue, Fever, Night Sweats, Weight Gain and Weight Loss. Skin Not Present- Change in Wart/Mole, Dryness, Hives, Jaundice, New Lesions, Non-Healing Wounds, Rash and Ulcer. HEENT Not Present- Earache, Hearing Loss, Hoarseness, Nose Bleed, Oral Ulcers, Ringing in the Ears, Seasonal Allergies, Sinus Pain, Sore Throat, Visual Disturbances, Wears glasses/contact lenses and Yellow Eyes. Respiratory Not Present- Bloody sputum, Chronic Cough, Difficulty Breathing, Snoring and Wheezing. Breast Not Present- Breast Mass, Breast Pain, Nipple Discharge and Skin Changes. Cardiovascular Not Present- Chest Pain, Difficulty Breathing Lying Down, Leg Cramps, Palpitations, Rapid Heart Rate, Shortness of Breath and Swelling of Extremities. Gastrointestinal Not Present- Abdominal Pain, Bloating, Bloody Stool, Change in Bowel Habits, Chronic diarrhea, Constipation, Difficulty Swallowing, Excessive gas, Gets full quickly at meals, Hemorrhoids, Indigestion, Nausea, Rectal Pain and Vomiting. Male Genitourinary Not Present- Blood in Urine, Change in Urinary Stream, Frequency, Impotence, Nocturia, Painful Urination, Urgency and Urine Leakage. Musculoskeletal Not Present- Back Pain, Joint Pain, Joint Stiffness, Muscle Pain, Muscle Weakness and Swelling of Extremities. Neurological Not Present- Decreased Memory, Fainting, Headaches, Numbness, Seizures, Tingling, Tremor, Trouble walking and Weakness. Psychiatric Not Present- Anxiety, Bipolar, Change in Sleep Pattern,  Depression, Fearful and Frequent crying. Endocrine Not Present- Cold Intolerance, Excessive Hunger, Hair Changes, Heat Intolerance and New Diabetes. Hematology Not Present- Easy Bruising, Excessive bleeding, Gland problems, HIV and Persistent Infections.   Physical Exam Axel Filler(Militza Devery, MD; 04/24/2015 11:49 00) General Mental Status-Alert. General Appearance-Consistent with stated age. Hydration-Well hydrated. Voice-Normal.  Head and Neck Head-normocephalic, atraumatic with no lesions or palpable masses. Trachea-midline. Thyroid Gland Characteristics - normal size and consistency.  Chest and Lung Exam Chest and lung exam reveals -quiet, even and easy respiratory effort with no use of accessory muscles and on auscultation, normal breath sounds, no adventitious sounds and normal vocal resonance. Inspection Chest Wall - Normal. Back - normal.  Cardiovascular Cardiovascular examination reveals -normal heart sounds, regular rate and rhythm with no murmurs and normal pedal pulses bilaterally.  Abdomen Inspection Skin - Scar - no surgical scars. Hernias - Ventral hernia - Reducible(Large ventral hernia just superior to the umbilicus, neck feels approximately 3-4 cm.). Palpation/Percussion Normal exam - Soft, Non Tender, No Rebound tenderness, No Rigidity (guarding) and No hepatosplenomegaly. Auscultation Normal exam - Bowel sounds normal.    Assessment & Plan Axel Filler(Briyah Wheelwright MD; 04/24/2015 11:51 AM) VENTRAL HERNIA WITHOUT OBSTRUCTION OR GANGRENE (K43.9) Impression: 59 year old male with a ventral primary hernia.  1. We will proceed to the operative her laparoscopic ventral hernia repair with mesh. 2. Discussed with patient the risks and benefits of the procedure to include but not limited to: Infection, bleeding, damage to structures, possible recurrence and pain. Patient was understanding and wishes to proceed.

## 2015-06-27 NOTE — Progress Notes (Signed)
Patient states he does not think prescription (Roxicet 5-325) will control pain adequately at home. Notified Dr. Derrell Lollingamirez. New prescription written for Percocet 10-325.

## 2015-06-27 NOTE — Discharge Instructions (Signed)
[facility] _______[facility] ° °HERNIA REPAIR: POST OP INSTRUCTIONS ° °Always review your discharge instruction sheet given to you by the facility where your surgery was performed. °IF YOU HAVE DISABILITY OR FAMILY LEAVE FORMS, YOU MUST BRING THEM TO THE OFFICE FOR PROCESSING.   °DO NOT GIVE THEM TO YOUR DOCTOR. ° °1. A  prescription for pain medication may be given to you upon discharge.  Take your pain medication as prescribed, if needed.  If narcotic pain medicine is not needed, then you may take acetaminophen (Tylenol) or ibuprofen (Advil) as needed. °2. Take your usually prescribed medications unless otherwise directed. °3. If you need a refill on your pain medication, please contact your pharmacy.  They will contact our office to request authorization. Prescriptions will not be filled after 5 pm or on week-ends. °4. You should follow a light diet the first 24 hours after arrival home, such as soup and crackers, etc.  Be sure to include lots of fluids daily.  Resume your normal diet the day after surgery. °5. Most patients will experience some swelling and bruising around the umbilicus or in the groin and scrotum.  Ice packs and reclining will help.  Swelling and bruising can take several days to resolve.  °6. It is common to experience some constipation if taking pain medication after surgery.  Increasing fluid intake and taking a stool softener (such as Colace) will usually help or prevent this problem from occurring.  A mild laxative (Milk of Magnesia or Miralax) should be taken according to package directions if there are no bowel movements after 48 hours. °7. Unless discharge instructions indicate otherwise, you may remove your bandages 24-48 hours after surgery, and you may shower at that time.  You may have steri-strips (small skin tapes) in place directly over the incision.  These strips should be left on the skin for 7-10 days.  If your surgeon used skin glue on the incision, you may shower  in 24 hours.  The glue will flake off over the next 2-3 weeks.  Any sutures or staples will be removed at the office during your follow-up visit. °8. ACTIVITIES:  You may resume regular (light) daily activities beginning the next day--such as daily self-care, walking, climbing stairs--gradually increasing activities as tolerated.  You may have sexual intercourse when it is comfortable.  Refrain from any heavy lifting or straining until approved by your doctor. °a. You may drive when you are no longer taking prescription pain medication, you can comfortably wear a seatbelt, and you can safely maneuver your car and apply brakes. °b. RETURN TO WORK:  __________________________________________________________ °9. You should see your doctor in the office for a follow-up appointment approximately 2-3 weeks after your surgery.  Make sure that you call for this appointment within a day or two after you arrive home to insure a convenient appointment time. °10. OTHER INSTRUCTIONS:  __________________________________________________________________________________________________________________________________________________________________________________________  °WHEN TO CALL YOUR DOCTOR: °1. Fever over 101.0 °2. Inability to urinate °3. Nausea and/or vomiting °4. Extreme swelling or bruising °5. Continued bleeding from incision. °6. Increased pain, redness, or drainage from the incision ° °The clinic staff is available to answer your questions during regular business hours.  Please don’t hesitate to call and ask to speak to one of the nurses for clinical concerns.  If you have a medical emergency, go to the nearest emergency room or call 911.  A surgeon from [facility] is always on call at the hospital ° ° °[address], [address], [place], [place]  [address] ? ° [address] [address], , [place]   [address] °[phone] ? [phone] ? FAX [phone] °Web site: [email or web address] ° °

## 2015-06-27 NOTE — Anesthesia Procedure Notes (Signed)
Procedure Name: Intubation Date/Time: 06/27/2015 7:35 AM Performed by: Doran ClayALDAY, Imanni Burdine R Pre-anesthesia Checklist: Patient identified, Patient being monitored, Emergency Drugs available, Timeout performed and Suction available Patient Re-evaluated:Patient Re-evaluated prior to inductionOxygen Delivery Method: Circle system utilized Preoxygenation: Pre-oxygenation with 100% oxygen Intubation Type: IV induction Laryngoscope Size: Mac and 4 Grade View: Grade I Tube type: Oral Tube size: 7.5 mm Number of attempts: 1 Airway Equipment and Method: Stylet Placement Confirmation: ETT inserted through vocal cords under direct vision,  breath sounds checked- equal and bilateral and positive ETCO2 Secured at: 23 cm Tube secured with: Tape Dental Injury: Teeth and Oropharynx as per pre-operative assessment

## 2015-06-27 NOTE — Anesthesia Postprocedure Evaluation (Signed)
  Anesthesia Post-op Note  Patient: Richard Graham  Procedure(s) Performed: Procedure(s) (LRB): LAPAROSCOPIC VENTRAL HERNIA (N/A) INSERTION OF MESH (N/A)  Patient Location: PACU  Anesthesia Type: General  Level of Consciousness: awake and alert   Airway and Oxygen Therapy: Patient Spontanous Breathing  Post-op Pain: mild  Post-op Assessment: Post-op Vital signs reviewed, Patient's Cardiovascular Status Stable, Respiratory Function Stable, Patent Airway and No signs of Nausea or vomiting  Last Vitals:  Filed Vitals:   06/27/15 1020  BP: 109/72  Pulse:   Temp: 36.6 C  Resp: 15    Post-op Vital Signs: stable   Complications: No apparent anesthesia complications

## 2015-06-27 NOTE — Op Note (Signed)
06/27/2015  8:49 AM  PATIENT:  Richard Graham  59 y.o. male  PRE-OPERATIVE DIAGNOSIS:  Ventral Hernia  POST-OPERATIVE DIAGNOSIS:  Incarcerated Ventral Hernia  PROCEDURE:  Procedure(s): LAPAROSCOPIC INCARCERATED VENTRAL HERNIA REPAIR WITH MESH (N/A) INSERTION OF MESH (N/A)  SURGEON:  Surgeon(s) and Role:    * Axel FillerArmando Maila Dukes, MD - Primary  ANESTHESIA:   local and general  EBL:  Total I/O In: 1000 [I.V.:1000] Out: 125 [Urine:125]  BLOOD ADMINISTERED:none  DRAINS: none   LOCAL MEDICATIONS USED:  BUPIVICAINE   SPECIMEN:  No Specimen  DISPOSITION OF SPECIMEN:  N/A  COUNTS:  YES  TOURNIQUET:  * No tourniquets in log *  DICTATION: .Dragon Dictation  Details of the procedure:   After the patient was consented patient was taken back to the operating room patient was then placed in supine position bilateral SCDs in place.  The patient was prepped and draped in the usual sterile fashion. After antibiotics were confirmed a timeout was called and all facts were verified. The Veress needle technique was used to insuflate the abdomen at Palmer's point. The abdomen was insufflated to 14 mm mercury. Subsequently a 5 mm trocar was placed a camera inserted there was no injury to any intra-abdominal organs.    There was seen to be an incarcerated ventral hernia.  A second camera port was in placed into the left lower quadrant.   At this time the Falicform ligament was taken down with Bovie cautery maintaining hemostasis. A 5mm port was placed in the epigastrium .   I proceeded to reduce the hernia contents and dissect away the hernia sac.  Once the hernia was cleared away, a Bard Ventralight 15.2cm  mesh was inserted into the abdomen.  The dissected hernia sac was removed from the abdominal cavity.  The mesh was secured circumferentially with am Securestrap tacker in a double crown fashion.  2-0 prolenes were used at 3:00, 6:00, 9:00, and 12:00 as transfascial sutures using a endoclose  decive.    The omentum was brought over the area of the mesh. The pneumoperitoneum was evacuated  & all trocars  were removed. The skin was reapproximated with 4-0  Monocryl sutures in a subcuticular fashion. The skin was dressed with Steri-Strips tape and gauze.  The patient was taken to the recovery room in stable condition.   PLAN OF CARE: Discharge to home after PACU  PATIENT DISPOSITION:  PACU - hemodynamically stable.   Delay start of Pharmacological VTE agent (>24hrs) due to surgical blood loss or risk of bleeding: not applicable

## 2015-06-27 NOTE — Transfer of Care (Signed)
Immediate Anesthesia Transfer of Care Note  Patient: Richard Graham  Procedure(s) Performed: Procedure(s): LAPAROSCOPIC VENTRAL HERNIA (N/A) INSERTION OF MESH (N/A)  Patient Location: PACU  Anesthesia Type:General  Level of Consciousness: sedated  Airway & Oxygen Therapy: Patient Spontanous Breathing and Patient connected to face mask oxygen  Post-op Assessment: Report given to RN and Post -op Vital signs reviewed and stable  Post vital signs: Reviewed and stable  Last Vitals:  Filed Vitals:   06/27/15 0502  BP: 125/78  Pulse: 91  Temp: 36.9 C  Resp: 18    Complications: No apparent anesthesia complications

## 2018-01-16 DIAGNOSIS — R7303 Prediabetes: Secondary | ICD-10-CM | POA: Diagnosis not present

## 2018-01-16 DIAGNOSIS — G894 Chronic pain syndrome: Secondary | ICD-10-CM | POA: Diagnosis not present

## 2018-01-16 DIAGNOSIS — E7800 Pure hypercholesterolemia, unspecified: Secondary | ICD-10-CM | POA: Diagnosis not present

## 2018-06-20 DIAGNOSIS — E7800 Pure hypercholesterolemia, unspecified: Secondary | ICD-10-CM | POA: Diagnosis not present

## 2018-06-20 DIAGNOSIS — R7303 Prediabetes: Secondary | ICD-10-CM | POA: Diagnosis not present

## 2018-06-20 DIAGNOSIS — G894 Chronic pain syndrome: Secondary | ICD-10-CM | POA: Diagnosis not present

## 2018-07-24 DIAGNOSIS — E7800 Pure hypercholesterolemia, unspecified: Secondary | ICD-10-CM | POA: Diagnosis not present

## 2018-07-24 DIAGNOSIS — Z0000 Encounter for general adult medical examination without abnormal findings: Secondary | ICD-10-CM | POA: Diagnosis not present

## 2018-07-24 DIAGNOSIS — M109 Gout, unspecified: Secondary | ICD-10-CM | POA: Diagnosis not present

## 2018-07-24 DIAGNOSIS — R7303 Prediabetes: Secondary | ICD-10-CM | POA: Diagnosis not present

## 2018-07-24 DIAGNOSIS — G894 Chronic pain syndrome: Secondary | ICD-10-CM | POA: Diagnosis not present

## 2019-11-05 ENCOUNTER — Inpatient Hospital Stay (HOSPITAL_COMMUNITY)
Admission: EM | Admit: 2019-11-05 | Discharge: 2019-11-16 | DRG: 286 | Disposition: A | Discharge status: Home / Self Care | Payer: Commercial Managed Care - PPO | Attending: Family Medicine | Admitting: Family Medicine

## 2019-11-05 ENCOUNTER — Emergency Department (HOSPITAL_COMMUNITY): Payer: Commercial Managed Care - PPO

## 2019-11-05 ENCOUNTER — Other Ambulatory Visit: Payer: Self-pay

## 2019-11-05 DIAGNOSIS — I4891 Unspecified atrial fibrillation: Secondary | ICD-10-CM | POA: Diagnosis not present

## 2019-11-05 DIAGNOSIS — E871 Hypo-osmolality and hyponatremia: Secondary | ICD-10-CM | POA: Diagnosis present

## 2019-11-05 DIAGNOSIS — D696 Thrombocytopenia, unspecified: Secondary | ICD-10-CM | POA: Diagnosis present

## 2019-11-05 DIAGNOSIS — E874 Mixed disorder of acid-base balance: Secondary | ICD-10-CM | POA: Diagnosis present

## 2019-11-05 DIAGNOSIS — I5021 Acute systolic (congestive) heart failure: Secondary | ICD-10-CM | POA: Diagnosis present

## 2019-11-05 DIAGNOSIS — I2510 Atherosclerotic heart disease of native coronary artery without angina pectoris: Secondary | ICD-10-CM | POA: Diagnosis present

## 2019-11-05 DIAGNOSIS — I480 Paroxysmal atrial fibrillation: Secondary | ICD-10-CM | POA: Diagnosis present

## 2019-11-05 DIAGNOSIS — J9601 Acute respiratory failure with hypoxia: Secondary | ICD-10-CM

## 2019-11-05 DIAGNOSIS — R0602 Shortness of breath: Secondary | ICD-10-CM | POA: Diagnosis present

## 2019-11-05 DIAGNOSIS — G9341 Metabolic encephalopathy: Secondary | ICD-10-CM | POA: Diagnosis present

## 2019-11-05 DIAGNOSIS — I509 Heart failure, unspecified: Secondary | ICD-10-CM | POA: Diagnosis not present

## 2019-11-05 DIAGNOSIS — I255 Ischemic cardiomyopathy: Secondary | ICD-10-CM | POA: Diagnosis present

## 2019-11-05 DIAGNOSIS — E8770 Fluid overload, unspecified: Secondary | ICD-10-CM

## 2019-11-05 DIAGNOSIS — Z6832 Body mass index (BMI) 32.0-32.9, adult: Secondary | ICD-10-CM | POA: Diagnosis not present

## 2019-11-05 DIAGNOSIS — I5041 Acute combined systolic (congestive) and diastolic (congestive) heart failure: Secondary | ICD-10-CM | POA: Diagnosis not present

## 2019-11-05 DIAGNOSIS — I729 Aneurysm of unspecified site: Secondary | ICD-10-CM

## 2019-11-05 DIAGNOSIS — I2582 Chronic total occlusion of coronary artery: Secondary | ICD-10-CM | POA: Diagnosis present

## 2019-11-05 DIAGNOSIS — E669 Obesity, unspecified: Secondary | ICD-10-CM

## 2019-11-05 DIAGNOSIS — I272 Pulmonary hypertension, unspecified: Secondary | ICD-10-CM | POA: Diagnosis present

## 2019-11-05 DIAGNOSIS — I4819 Other persistent atrial fibrillation: Secondary | ICD-10-CM | POA: Diagnosis present

## 2019-11-05 DIAGNOSIS — M109 Gout, unspecified: Secondary | ICD-10-CM | POA: Diagnosis present

## 2019-11-05 DIAGNOSIS — Z87891 Personal history of nicotine dependence: Secondary | ICD-10-CM | POA: Diagnosis not present

## 2019-11-05 DIAGNOSIS — Z20822 Contact with and (suspected) exposure to covid-19: Secondary | ICD-10-CM | POA: Diagnosis present

## 2019-11-05 DIAGNOSIS — R0689 Other abnormalities of breathing: Secondary | ICD-10-CM | POA: Diagnosis not present

## 2019-11-05 DIAGNOSIS — R7303 Prediabetes: Secondary | ICD-10-CM | POA: Diagnosis present

## 2019-11-05 DIAGNOSIS — I671 Cerebral aneurysm, nonruptured: Secondary | ICD-10-CM | POA: Diagnosis present

## 2019-11-05 DIAGNOSIS — E876 Hypokalemia: Secondary | ICD-10-CM | POA: Diagnosis present

## 2019-11-05 DIAGNOSIS — M1A9XX0 Chronic gout, unspecified, without tophus (tophi): Secondary | ICD-10-CM | POA: Diagnosis not present

## 2019-11-05 LAB — URINALYSIS, ROUTINE W REFLEX MICROSCOPIC
Bilirubin Urine: NEGATIVE
Glucose, UA: NEGATIVE mg/dL
Hgb urine dipstick: NEGATIVE
Ketones, ur: NEGATIVE mg/dL
Leukocytes,Ua: NEGATIVE
Nitrite: NEGATIVE
Protein, ur: NEGATIVE mg/dL
Specific Gravity, Urine: 1.004 — ABNORMAL LOW (ref 1.005–1.030)
pH: 6 (ref 5.0–8.0)

## 2019-11-05 LAB — CBC
HCT: 47 % (ref 39.0–52.0)
HCT: 47 % (ref 39.0–52.0)
Hemoglobin: 15.3 g/dL (ref 13.0–17.0)
Hemoglobin: 15.2 g/dL (ref 13.0–17.0)
MCH: 30.3 pg (ref 26.0–34.0)
MCH: 30.2 pg (ref 26.0–34.0)
MCHC: 32.6 g/dL (ref 30.0–36.0)
MCHC: 32.3 g/dL (ref 30.0–36.0)
MCV: 93.1 fL (ref 80.0–100.0)
MCV: 93.3 fL (ref 80.0–100.0)
Platelets: 104 10*3/uL — ABNORMAL LOW (ref 150–400)
Platelets: 162 10*3/uL (ref 150–400)
RBC: 5.05 MIL/uL (ref 4.22–5.81)
RBC: 5.04 MIL/uL (ref 4.22–5.81)
RDW: 13.9 % (ref 11.5–15.5)
RDW: 13.9 % (ref 11.5–15.5)
WBC: 8.6 10*3/uL (ref 4.0–10.5)
WBC: 8.6 10*3/uL (ref 4.0–10.5)
nRBC: 0 % (ref 0.0–0.2)
nRBC: 0 % (ref 0.0–0.2)

## 2019-11-05 LAB — COMPREHENSIVE METABOLIC PANEL
ALT: 23 U/L (ref 0–44)
ALT: 19 U/L (ref 0–44)
AST: 30 U/L (ref 15–41)
AST: 34 U/L (ref 15–41)
Albumin: 3.8 g/dL (ref 3.5–5.0)
Albumin: 3.5 g/dL (ref 3.5–5.0)
Alkaline Phosphatase: 63 U/L (ref 38–126)
Alkaline Phosphatase: 58 U/L (ref 38–126)
Anion gap: 13 (ref 5–15)
Anion gap: 10 (ref 5–15)
BUN: 17 mg/dL (ref 8–23)
BUN: 16 mg/dL (ref 8–23)
CO2: 28 mmol/L (ref 22–32)
CO2: 27 mmol/L (ref 22–32)
Calcium: 8.7 mg/dL — ABNORMAL LOW (ref 8.9–10.3)
Calcium: 7.9 mg/dL — ABNORMAL LOW (ref 8.9–10.3)
Chloride: 81 mmol/L — ABNORMAL LOW (ref 98–111)
Chloride: 87 mmol/L — ABNORMAL LOW (ref 98–111)
Creatinine, Ser: 0.77 mg/dL (ref 0.61–1.24)
Creatinine, Ser: 0.79 mg/dL (ref 0.61–1.24)
GFR calc Af Amer: >60 mL/min (ref >60)
GFR calc Af Amer: >60 mL/min (ref >60)
GFR calc non Af Amer: >60 mL/min (ref >60)
GFR calc non Af Amer: >60 mL/min (ref >60)
Glucose, Bld: 121 mg/dL — ABNORMAL HIGH (ref 70–99)
Glucose, Bld: 110 mg/dL — ABNORMAL HIGH (ref 70–99)
Potassium: 5 mmol/L (ref 3.5–5.1)
Potassium: 4.6 mmol/L (ref 3.5–5.1)
Sodium: 122 mmol/L — ABNORMAL LOW (ref 135–145)
Sodium: 124 mmol/L — ABNORMAL LOW (ref 135–145)
Total Bilirubin: 1.5 mg/dL — ABNORMAL HIGH (ref 0.3–1.2)
Total Bilirubin: 1.6 mg/dL — ABNORMAL HIGH (ref 0.3–1.2)
Total Protein: 6 g/dL — ABNORMAL LOW (ref 6.5–8.1)
Total Protein: 5.6 g/dL — ABNORMAL LOW (ref 6.5–8.1)

## 2019-11-05 LAB — TROPONIN I (HIGH SENSITIVITY)
Troponin I (High Sensitivity): 11 ng/L (ref <18)
Troponin I (High Sensitivity): 14 ng/L (ref <18)
Troponin I (High Sensitivity): 12 ng/L (ref <18)

## 2019-11-05 LAB — LIPID PANEL
Cholesterol: 102 mg/dL (ref 0–200)
HDL: 32 mg/dL — ABNORMAL LOW (ref >40)
LDL Cholesterol: 61 mg/dL (ref 0–99)
Total CHOL/HDL Ratio: 3.2 RATIO
Triglycerides: 43 mg/dL (ref <150)
VLDL: 9 mg/dL (ref 0–40)

## 2019-11-05 LAB — MAGNESIUM: Magnesium: 2.1 mg/dL (ref 1.7–2.4)

## 2019-11-05 LAB — T4, FREE: Free T4: 1.2 ng/dL — ABNORMAL HIGH (ref 0.61–1.12)

## 2019-11-05 LAB — SARS CORONAVIRUS 2 (TAT 6-24 HRS): SARS Coronavirus 2: NEGATIVE

## 2019-11-05 LAB — TSH: TSH: 1.067 u[IU]/mL (ref 0.350–4.500)

## 2019-11-05 LAB — HEMOGLOBIN A1C
Hgb A1c MFr Bld: 6 % — ABNORMAL HIGH (ref 4.8–5.6)
Mean Plasma Glucose: 125.5 mg/dL

## 2019-11-05 LAB — CREATININE, SERUM
Creatinine, Ser: 0.76 mg/dL (ref 0.61–1.24)
GFR calc Af Amer: >60 mL/min (ref >60)
GFR calc non Af Amer: >60 mL/min (ref >60)

## 2019-11-05 LAB — PHOSPHORUS: Phosphorus: 4 mg/dL (ref 2.5–4.6)

## 2019-11-05 LAB — BRAIN NATRIURETIC PEPTIDE: B Natriuretic Peptide: 318.7 pg/mL — ABNORMAL HIGH (ref 0.0–100.0)

## 2019-11-05 LAB — HIV ANTIBODY (ROUTINE TESTING W REFLEX): HIV Screen 4th Generation wRfx: NONREACTIVE

## 2019-11-05 IMAGING — DX DG CHEST 1V PORT
1 series · 1 of 1 positions shown · non-contrast
Comparison: CT Abdomen and Pelvis [DATE].

CLINICAL DATA: 63-year-old male with increasing shortness of breath
x2 weeks. Abdominal and lower extremity edema. Wheezing. Found to be
in atrial fibrillation with RVR.

EXAM:
PORTABLE CHEST 1 VIEW

[chest]
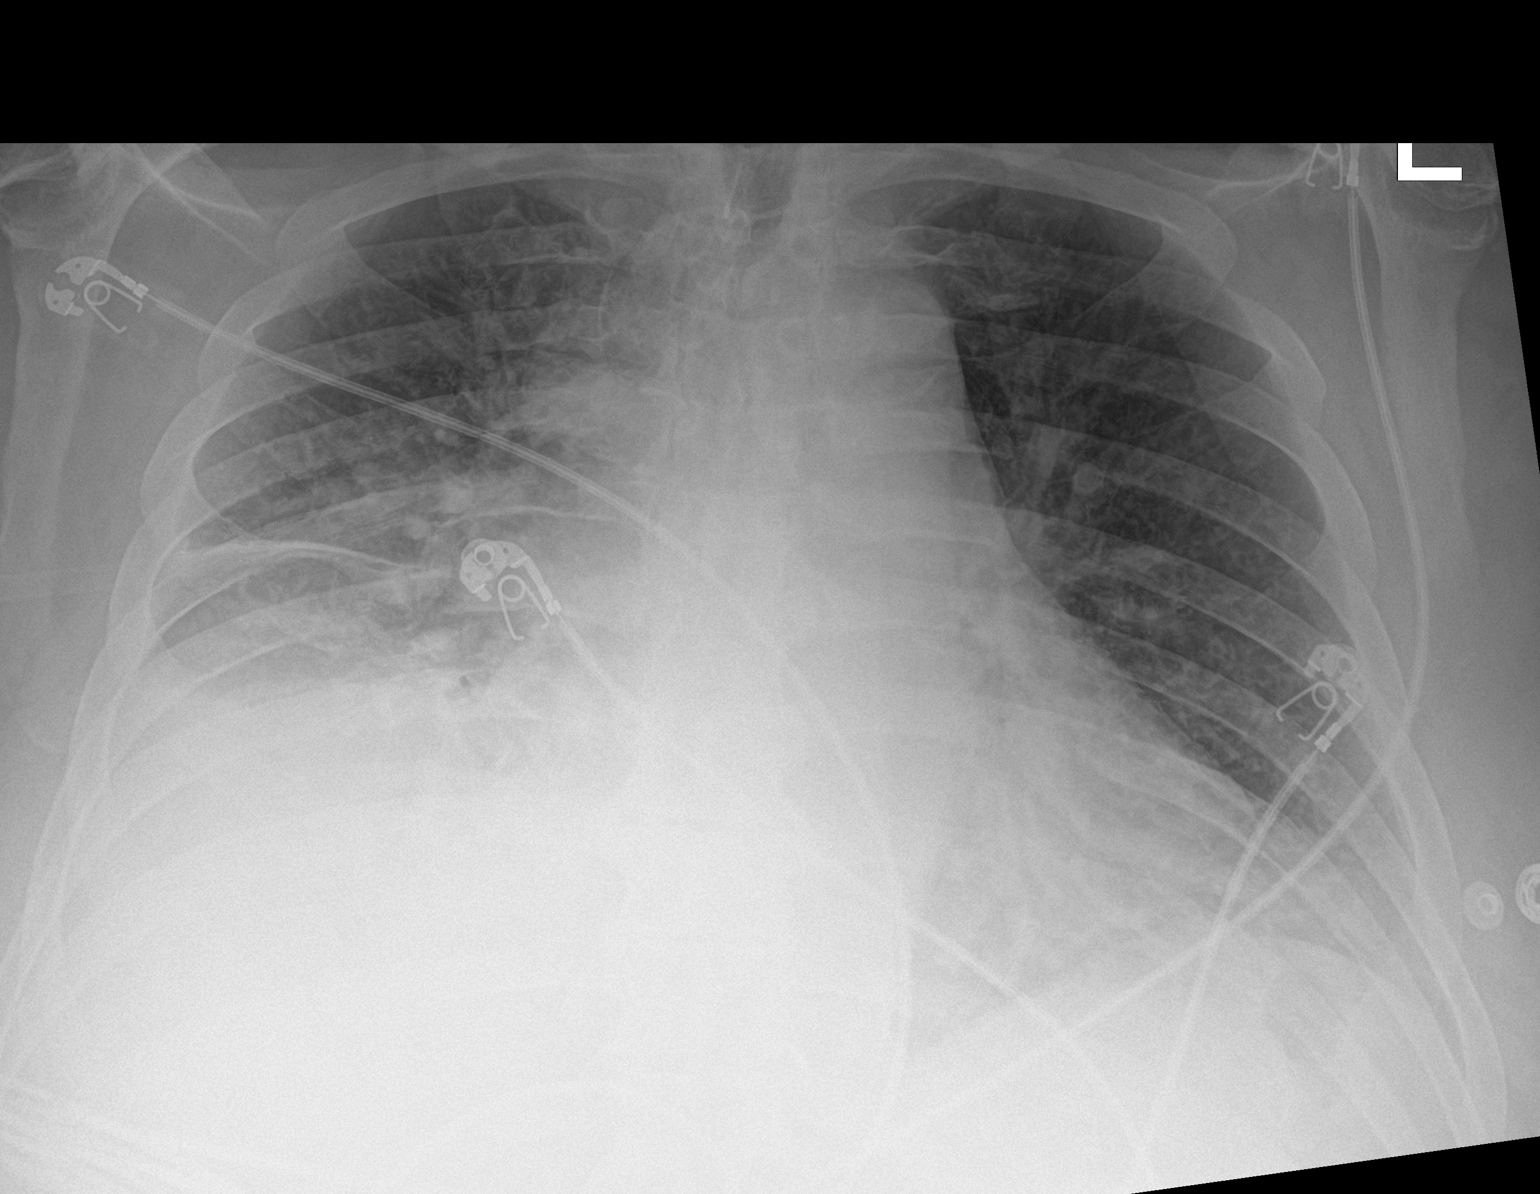

[1 of 1 positions shown; findings below may reference images not displayed]

FINDINGS: Portable AP semi upright view at [EW] hours. Possible new
cardiomegaly since [EW]. Other mediastinal contours are within
normal limits.

Low lung volumes with combined patchy and veiling opacity at the
right lung base. Generalized pulmonary vascular congestion. No
pneumothorax. No left lung base opacity. No acute osseous
abnormality identified.
IMPRESSION: 1. Low lung volumes with suspected pulmonary interstitial edema with
right lung base atelectasis and/or small pleural effusion.
2. Possible cardiomegaly, which would be new since [DATE].

## 2019-11-05 MED ORDER — ONDANSETRON HCL 4 MG/2ML IJ SOLN: 4.0000 mg | Freq: Four times a day (QID) | INTRAMUSCULAR | Status: DC | PRN

## 2019-11-05 MED ORDER — FUROSEMIDE 10 MG/ML IJ SOLN
20.0000 mg | Freq: Once | INTRAMUSCULAR | Status: AC
  Administered 2019-11-05: 20 mg via INTRAVENOUS
  Filled 2019-11-05: qty 2

## 2019-11-05 MED ORDER — ONDANSETRON HCL 4 MG PO TABS: 4.0000 mg | ORAL_TABLET | Freq: Four times a day (QID) | ORAL | Status: DC | PRN

## 2019-11-05 MED ORDER — ACETAMINOPHEN 325 MG PO TABS: 650.0000 mg | ORAL_TABLET | Freq: Four times a day (QID) | ORAL | Status: DC | PRN

## 2019-11-05 MED ORDER — HEPARIN BOLUS VIA INFUSION
2500.0000 [IU] | Freq: Once | INTRAVENOUS | Status: AC
  Administered 2019-11-05: 21:00:00 2500 [IU] via INTRAVENOUS
  Filled 2019-11-05: qty 2500

## 2019-11-05 MED ORDER — ACETAMINOPHEN 650 MG RE SUPP: 650.0000 mg | Freq: Four times a day (QID) | RECTAL | Status: DC | PRN

## 2019-11-05 MED ORDER — FUROSEMIDE 10 MG/ML IJ SOLN: 40.0000 mg | Freq: Every day | INTRAMUSCULAR | Status: DC

## 2019-11-05 MED ORDER — FUROSEMIDE 10 MG/ML IJ SOLN
40.0000 mg | Freq: Two times a day (BID) | INTRAMUSCULAR | Status: DC
  Administered 2019-11-05: 40 mg via INTRAVENOUS
  Filled 2019-11-05: qty 4

## 2019-11-05 MED ORDER — HEPARIN (PORCINE) 25000 UT/250ML-% IV SOLN
1700.0000 [IU]/h | INTRAVENOUS | Status: DC
  Administered 2019-11-05: 1550 [IU]/h via INTRAVENOUS
  Administered 2019-11-07: 1450 [IU]/h via INTRAVENOUS
  Administered 2019-11-08: 1700 [IU]/h via INTRAVENOUS
  Administered 2019-11-08: 1550 [IU]/h via INTRAVENOUS
  Administered 2019-11-09 – 2019-11-10 (×2): 1650 [IU]/h via INTRAVENOUS
  Administered 2019-11-11: 13:00:00 1700 [IU]/h via INTRAVENOUS
  Filled 2019-11-05 (×10): qty 250

## 2019-11-05 MED ORDER — DILTIAZEM HCL-DEXTROSE 125-5 MG/125ML-% IV SOLN (PREMIX)
5.0000 mg/h | INTRAVENOUS | Status: DC
  Administered 2019-11-05 – 2019-11-06 (×3): 5 mg/h via INTRAVENOUS
  Filled 2019-11-05 (×2): qty 125

## 2019-11-05 MED ORDER — HEPARIN BOLUS VIA INFUSION
6000.0000 [IU] | Freq: Once | INTRAVENOUS | Status: DC
  Filled 2019-11-05: qty 6000

## 2019-11-05 MED ORDER — ENOXAPARIN SODIUM 40 MG/0.4ML ~~LOC~~ SOLN
40.0000 mg | SUBCUTANEOUS | Status: DC
  Administered 2019-11-05: 19:00:00 40 mg via SUBCUTANEOUS
  Filled 2019-11-05: qty 0.4

## 2019-11-05 NOTE — Progress Notes (Signed)
ANTICOAGULATION CONSULT NOTE - Initial Consult  Pharmacy Consult for heparin Indication: atrial fibrillation  No Known Allergies  Patient Measurements: Height: 6\' 1"  (185.4 cm) Weight: 247 lb (112 kg) IBW/kg (Calculated) : 79.9 Heparin Dosing Weight: 103.5  Vital Signs: BP: 124/98 (03/22 1515) Pulse Rate: 48 (03/22 1600)  Labs: Recent Labs    11/05/19 1616 11/05/19 1721 11/05/19 1731 11/05/19 1822  HGB 15.3  --   --  15.2  HCT 47.0  --   --  47.0  PLT 104*  --   --  162  CREATININE  --  0.77  --  0.76  TROPONINIHS  --   --  11 14    Estimated Creatinine Clearance: 127.1 mL/min (by C-G formula based on SCr of 0.76 mg/dL).   Medical History: Past Medical History:  Diagnosis Date  . Gout     Medications:  Scheduled:  . furosemide  40 mg Intravenous BID  . heparin  2,500 Units Intravenous Once   Infusions:  . diltiazem (CARDIZEM) infusion    . heparin      Assessment: Richard Graham is a 64 y/o M presenting with new onset atrial fibrillation with CHF. He is not on any anticoagulation PTA. His CHADS2VASC score is 1 (new CHF).  Per cardiology, will start IV heparin for now until we have echo back and make sure no invasive procedures are required and then transition to DOAC.  He received Lovenox 40mg  at 1900 today. Will decrease bolus dose of heparin.  CBC WNL  Goal of Therapy:  Heparin level 0.3-0.7 units/ml Monitor platelets by anticoagulation protocol: Yes   Plan:  Give 2500 units bolus x 1 Start heparin infusion at 1550 units/hr Check anti-Xa level in 6 hours and daily while on heparin Continue to monitor H&H and platelets  79, PharmD PGY1 Ambulatory Care Pharmacy Resident 11/05/2019,8:41 PM

## 2019-11-05 NOTE — H&P (Addendum)
History and Physical    Richard Graham OZD:[id number] DOB: Dec 20, 1955 DOA: 11/05/2019  PCP: Johny Blamer, MD  Patient coming from: Home  I have personally briefly reviewed patient's old medical records in [facility]  Chief Complaint: Exertional shortness of breath, generalized body swelling since 3 weeks  HPI: Richard Graham is a 64 y.o. male with medical history significant of prediabetes, obesity, gout presents to emergency department due to worsening shortness of breath and leg swelling since 3 weeks.  Patient tells me that he has shortness of breath especially with exertion, weight gain of 30 pounds, generalized body swelling (face, abdomen, legs), orthopnea since 3 weeks.  Reports that he has not been feeling well lately and has decreased appetite.  Denies association with chest pain, palpitation, fever, chills, cough, congestion, wheezing, abdominal pain, dysuria, hematuria, bowel changes.  Per EMS: Patient was found to be in A. fib with RVR with rate of 170s to 200s in route to ED.  He was given 10 mg of Cardizem and 5 mg of albuterol and was placed on 4 L of oxygen via nasal cannula for comfort.  Denies previous history of hypertension, A. fib or CHF.  He only takes over-the-counter Advil, fish oil, vitamin C and zinc at home.  He lives with his spouse at home.  Former smoker, no significant history of alcohol or illicit drug use.    ED Course: Upon arrival to ED: Patient tachycardic, tachypneic, blood pressure soft. Review of Systems: As per HPI otherwise negative.  BNP: 318.7, TSH: WNL, POC COVID-19 negative.  Troponin pending.  EKG shows A. fib.  Chest x-ray shows low lung volumes with suspected pulmonary interstitial edema with the right lung base atelectasis and/or small pleural effusion.  Possible cardiomegaly with would be new since 2016.  Patient received Lasix 20 mg IV once in ED.  Prior hospitalist consulted for admission for new onset A. fib and CHF.   Past  Medical History:  Diagnosis Date  . Gout     Past Surgical History:  Procedure Laterality Date  . INSERTION OF MESH N/A 06/27/2015   Procedure: INSERTION OF MESH;  Surgeon: Axel Filler, MD;  Location: [facility];  Service: General;  Laterality: N/A;  . laceration to fingers     left hand  . MOUTH SURGERY     secondary to abscess   . trauma to lip      sutured  . VENTRAL HERNIA REPAIR N/A 06/27/2015   Procedure: LAPAROSCOPIC VENTRAL HERNIA;  Surgeon: Axel Filler, MD;  Location: [facility];  Service: General;  Laterality: N/A;     reports that he quit smoking about 8 years ago. His smoking use included cigarettes. He has a 0.75 pack-year smoking history. He has never used smokeless tobacco. He reports current alcohol use. He reports that he does not use drugs.  No Known Allergies  No family history on file.  Prior to Admission medications   Medication Sig Start Date End Date Taking? Authorizing Provider  aspirin (ASPIRIN EC) 81 MG EC tablet Take 81 mg by mouth daily.    [provider]  CIALIS 20 MG tablet Take 10 mg by mouth daily as needed. Erectile dysfunction 06/13/15   [provider]  colchicine 0.6 MG tablet Take 0.6 mg by mouth every other day.     [provider]  diclofenac sodium (VOLTAREN) 1 % GEL Apply 2 g topically 2 (two) times daily as needed (arthritis pain).    [provider]  Glucosamine HCl 1000 MG TABS Take 1,000 mg by mouth daily.    [provider]  ibuprofen (ADVIL,MOTRIN) 200 MG tablet Take 400 mg by mouth at bedtime as needed for mild pain (knee pain).    [provider]  Omega-3 Fatty Acids (FISH OIL PO) Take 1 capsule by mouth 2 (two) times daily.     [provider]  oxyCODONE-acetaminophen (PERCOCET) 10-325 MG per tablet Take 1 tablet by mouth every 6 (six) hours as needed for pain. For pain    [provider]  oxyCODONE-acetaminophen (PERCOCET) 10-325 MG tablet Take 1 tablet by  mouth every 4 (four) hours as needed for pain. 06/27/15   Axel Filler, MD  oxyCODONE-acetaminophen (ROXICET) 5-325 MG tablet Take 1-2 tablets by mouth every 4 (four) hours as needed. 06/27/15   Axel Filler, MD  Pyridoxine HCl (VITAMIN B-6 PO) Take 1 tablet by mouth 2 (two) times daily.     [provider]  Sodium Hyaluronate, Viscosup, (EUFLEXXA IX) Inject 1 each into the articular space every 6 (six) months. 3 injections every week x3 no more than every 6 months at Dr Jola Babinski at [facility]    [provider]  temazepam (RESTORIL) 30 MG capsule Take 30 mg by mouth at bedtime as needed for sleep.    [provider]  TURMERIC PO Take 1 capsule by mouth 2 (two) times daily.     [provider]    Physical Exam: Vitals:   11/05/19 1515 11/05/19 1530 11/05/19 1545 11/05/19 1600  BP: (!) 124/98     Pulse: (!) 32 (!) 36 62 (!) 48  Resp: (!) 0 11 (!) 29 (!) 0  SpO2: 90% 92% 96% (!) 89%  Weight: 112 kg     Height: 6\' 1"  (1.854 m)       Constitutional: NAD, calm, comfortable, morbidly obese, communicating well Eyes: PERRL, lids and conjunctivae normal ENMT: Mucous membranes are moist. Posterior pharynx clear of any exudate or lesions.Normal dentition.  Neck: normal, supple, no masses, no thyromegaly Respiratory: Decreased breath sound on the basis.  No wheezing, rhonchi or crackles noted. Cardiovascular: Regular rate and rhythm, no murmurs / rubs / gallops.  3+ pitting edema positive in bilateral lower extremities. 2+ pedal pulses. No carotid bruits.  Abdomen: Abdomen distended, no tenderness, no masses palpated. No hepatosplenomegaly. Bowel sounds positive.  Musculoskeletal: no clubbing / cyanosis. No joint deformity upper and lower extremities. Good ROM, no contractures. Normal muscle tone.  Skin: no rashes, lesions, ulcers. No induration Neurologic: CN 2-12 grossly intact. Sensation intact, DTR normal. Strength 5/5 in all 4.  Psychiatric:  Normal judgment and insight. Alert and oriented x 3. Normal mood.    Labs on Admission: I have personally reviewed following labs and imaging studies  CBC: Recent Labs  Lab 11/05/19 1616  WBC 8.6  HGB 15.3  HCT 47.0  MCV 93.1  PLT 104*   Basic Metabolic Panel: No results for input(s): NA, K, CL, CO2, GLUCOSE, BUN, CREATININE, CALCIUM, MG, PHOS in the last 168 hours. GFR: CrCl cannot be calculated (Patient's most recent lab result is older than the maximum 21 days allowed.). Liver Function Tests: No results for input(s): AST, ALT, ALKPHOS, BILITOT, PROT, ALBUMIN in the last 168 hours. No results for input(s): LIPASE, AMYLASE in the last 168 hours. No results for input(s): AMMONIA in the last 168 hours. Coagulation Profile: No results for input(s): INR, PROTIME in the last 168 hours. Cardiac Enzymes: No results for input(s): CKTOTAL, CKMB,  CKMBINDEX, TROPONINI in the last 168 hours. BNP (last 3 results) No results for input(s): PROBNP in the last 8760 hours. HbA1C: No results for input(s): HGBA1C in the last 72 hours. CBG: No results for input(s): GLUCAP in the last 168 hours. Lipid Profile: No results for input(s): CHOL, HDL, LDLCALC, TRIG, CHOLHDL, LDLDIRECT in the last 72 hours. Thyroid Function Tests: Recent Labs    11/05/19 1616  TSH 1.067  FREET4 1.20*   Anemia Panel: No results for input(s): VITAMINB12, FOLATE, FERRITIN, TIBC, IRON, RETICCTPCT in the last 72 hours. Urine analysis: No results found for: COLORURINE, APPEARANCEUR, LABSPEC, PHURINE, GLUCOSEU, HGBUR, BILIRUBINUR, KETONESUR, PROTEINUR, UROBILINOGEN, NITRITE, LEUKOCYTESUR  Radiological Exams on Admission: DG Chest Portable 1 View  Result Date: 11/05/2019 CLINICAL DATA:  64 year old male with increasing shortness of breath x2 weeks. Abdominal and lower extremity edema. Wheezing. Found to be in atrial fibrillation with RVR. EXAM: PORTABLE CHEST 1 VIEW COMPARISON:  CT Abdomen and Pelvis 04/08/2015.  FINDINGS: Portable AP semi upright view at 1539 hours. Possible new cardiomegaly since 2016. Other mediastinal contours are within normal limits. Low lung volumes with combined patchy and veiling opacity at the right lung base. Generalized pulmonary vascular congestion. No pneumothorax. No left lung base opacity. No acute osseous abnormality identified. IMPRESSION: 1. Low lung volumes with suspected pulmonary interstitial edema with right lung base atelectasis and/or small pleural effusion. 2. Possible cardiomegaly, which would be new since 2016. Electronically Signed   By: Genevie Ann M.D.   On: 11/05/2019 15:54    EKG: Independently reviewed.  A. Fib. RBBB and LAFB  Assessment/Plan Principal Problem:   Acute CHF (congestive heart failure) (HCC) Active Problems:   Gout   Thrombocytopenia (HCC)   New onset a-fib (HCC)   Morbid obesity ([place])   Acute congestive heart failure: -Patient reports weight gain of 30 pounds, exertional shortness of breath, generalized body swelling ([place]), orthopnea. -Differential diagnosis: CHF, nephrotic syndrome. -Chest x-ray shows signs of pulmonary interstitial edema and cardiomegaly.   -BNP: 318.  TSH: WNL.  Troponin pending.  CMP, UA: Pending. - POC COVID-19 negative. -Patient received Lasix 20 mg IV once in the ED. -Admit patient on the floor.  On telemetry. -We will start patient on Lasix 40 mg IV daily.  Strict I&O's and daily weight.  Check electrolytes.   -We will get transthoracic echo. -Consulted cardiology.  New onset A. Fib: -Could be due to undiagnosed obstructive sleep apnea -Patient is afebrile, no leukocytosis.  UA and BMP is pending. -TSH: WNL.  Check lipid panel, A1c -On telemetry.  CHA2DS2-VASc score is 0 -Currently BP is soft-discussed with cardiology-they will come and evaluate the patient tonight.  Will defer to start rate control drugs to cardiology.  Thrombocytopenia: Platelet count 104 -No signs of active bleeding.  Continue to  monitor.  Obesity: BMI 32.5  Unable to start patient's home medicines as med reconciliation is pending by pharmacy.  DVT prophylaxis: Lovenox/SCD/TED Code Status: Full code-confirmed with the patient Family Communication: Patient's wife present at bedside.  Plan of care discussed with patient and his wife at bedside in length and they verbalized understanding and agreed with it. Disposition Plan: To be determined Consults called: Cardiology Admission status: Inpatient  Mckinley Jewel MD [facility] Pager [phone]  If 7PM-7AM, please contact night-coverage [email or web address] Password [facility]  11/05/2019, 6:25 PM

## 2019-11-05 NOTE — ED Provider Notes (Addendum)
[facility] EMERGENCY DEPARTMENT Provider Note   CSN: [id number] Arrival date & time: 11/05/19  1448     History Chief Complaint  Patient presents with   Atrial Fibrillation   Shortness of Breath    Richard Graham is a 64 y.o. male.  HPI Patient is a 64 year old male with a PMH of prediabetes and gout presenting to the ED today due to shortness of breath.  Patient reports he has not been feeling well for the past 2 to 3 weeks.  He first noticed a cough productive of clear sputum and "chest congestion."  He says that more recently, he has noted abdominal distention and swelling in both legs.  He says that his shortness of breath worsens at night and that he is experience some wheezing.  He endorses dyspnea on exertion and orthopnea.  He denies chest pain, fever, chills, nausea, vomiting or diarrhea.  Patient attributes the symptoms to deconditioning due to inactivity during Covid.  Patient denies any known Covid contacts.  Per EMS, he was found to be in A. fib with RVR with a rate of 170s to 200s.  He was given 10 mg Cardizem and 5 mg albuterol due to wheezing.  Patient placed on 4 L nasal cannula for comfort.    Past Medical History:  Diagnosis Date   Gout     There are no problems to display for this patient.   Past Surgical History:  Procedure Laterality Date   INSERTION OF MESH N/A 06/27/2015   Procedure: INSERTION OF MESH;  Surgeon: Axel Filler, MD;  Location: [facility];  Service: General;  Laterality: N/A;   laceration to fingers     left hand   MOUTH SURGERY     secondary to abscess    trauma to lip      sutured   VENTRAL HERNIA REPAIR N/A 06/27/2015   Procedure: LAPAROSCOPIC VENTRAL HERNIA;  Surgeon: Axel Filler, MD;  Location: [facility];  Service: General;  Laterality: N/A;       No family history on file.  Social History   Tobacco Use   Smoking status: Former Smoker    Packs/day: 0.25    Years: 3.00    Pack years: 0.75   Types: Cigarettes    Quit date: 08/17/2011    Years since quitting: 8.2   Smokeless tobacco: Never Used  Substance Use Topics   Alcohol use: Yes    Comment: rarely   Drug use: No    Home Medications Prior to Admission medications   Medication Sig Start Date End Date Taking? Authorizing Provider  aspirin (ASPIRIN EC) 81 MG EC tablet Take 81 mg by mouth daily.    [provider]  CIALIS 20 MG tablet Take 10 mg by mouth daily as needed. Erectile dysfunction 06/13/15   [provider]  colchicine 0.6 MG tablet Take 0.6 mg by mouth every other day.     [provider]  diclofenac sodium (VOLTAREN) 1 % GEL Apply 2 g topically 2 (two) times daily as needed (arthritis pain).    [provider]  Glucosamine HCl 1000 MG TABS Take 1,000 mg by mouth daily.    [provider]  ibuprofen (ADVIL,MOTRIN) 200 MG tablet Take 400 mg by mouth at bedtime as needed for mild pain (knee pain).    [provider]  Omega-3 Fatty Acids (FISH OIL PO) Take 1 capsule by mouth 2 (two) times daily.     [provider]  oxyCODONE-acetaminophen (  PERCOCET) 10-325 MG per tablet Take 1 tablet by mouth every 6 (six) hours as needed for pain. For pain    [provider]  oxyCODONE-acetaminophen (PERCOCET) 10-325 MG tablet Take 1 tablet by mouth every 4 (four) hours as needed for pain. 06/27/15   Axel Filler, MD  oxyCODONE-acetaminophen (ROXICET) 5-325 MG tablet Take 1-2 tablets by mouth every 4 (four) hours as needed. 06/27/15   Axel Filler, MD  Pyridoxine HCl (VITAMIN B-6 PO) Take 1 tablet by mouth 2 (two) times daily.     [provider]  Sodium Hyaluronate, Viscosup, (EUFLEXXA IX) Inject 1 each into the articular space every 6 (six) months. 3 injections every week x3 no more than every 6 months at Dr Jola Babinski at [facility]    [provider]  temazepam (RESTORIL) 30 MG capsule Take 30 mg by mouth at bedtime as needed for  sleep.    [provider]  TURMERIC PO Take 1 capsule by mouth 2 (two) times daily.     [provider]    Allergies    Patient has no known allergies.  Review of Systems   Review of Systems  Constitutional: Negative for appetite change, chills and fever.  HENT: Positive for congestion. Negative for rhinorrhea and sore throat.   Eyes: Negative for photophobia and visual disturbance.  Respiratory: Positive for cough, shortness of breath and wheezing.   Cardiovascular: Positive for leg swelling. Negative for chest pain and palpitations.  Gastrointestinal: Negative for abdominal pain, diarrhea, nausea and vomiting.  Genitourinary: Negative for dysuria and hematuria.  Musculoskeletal: Negative for arthralgias, back pain and gait problem.  Skin: Negative for rash and wound.  Neurological: Negative for weakness, light-headedness and headaches.  Psychiatric/Behavioral: Negative for agitation.  All other systems reviewed and are negative.   Physical Exam Updated Vital Signs There were no vitals taken for this visit.  Physical Exam Vitals and nursing note reviewed.  Constitutional:      General: He is not in acute distress.    Appearance: Normal appearance. He is well-developed. He is obese. He is not ill-appearing.  HENT:     Head: Normocephalic and atraumatic.     Right Ear: External ear normal.     Left Ear: External ear normal.     Nose: Nose normal. No congestion or rhinorrhea.     Mouth/Throat:     Mouth: Mucous membranes are moist.     Pharynx: Oropharynx is clear.  Eyes:     Extraocular Movements: Extraocular movements intact.     Pupils: Pupils are equal, round, and reactive to light.  Cardiovascular:     Rate and Rhythm: Tachycardia present. Rhythm irregular.     Pulses: Normal pulses.     Heart sounds: Normal heart sounds.  Pulmonary:     Effort: Pulmonary effort is normal.     Breath sounds: No stridor. Wheezing (mild expiratory wheezing in R  upper lung field) and rales (diffuse) present. No rhonchi.  Abdominal:     General: There is distension.     Palpations: Abdomen is soft.     Tenderness: There is no abdominal tenderness. There is no guarding or rebound.  Musculoskeletal:        General: Normal range of motion.     Cervical back: Normal range of motion and neck supple.     Right lower leg: Edema present.     Left lower leg: Edema present.     Comments: 2+ pitting edema to shins  Skin:  General: Skin is warm and dry.     Capillary Refill: Capillary refill takes less than 2 seconds.  Neurological:     General: No focal deficit present.     Mental Status: He is alert and oriented to person, place, and time. Mental status is at baseline.     Cranial Nerves: No cranial nerve deficit.  Psychiatric:        Mood and Affect: Mood normal.     ED Results / Procedures / Treatments   Labs (all labs ordered are listed, but only abnormal results are displayed) Labs Reviewed  CBC - Abnormal; Notable for the following components:      Result Value   Platelets 104 (*)    All other components within normal limits  BRAIN NATRIURETIC PEPTIDE - Abnormal; Notable for the following components:   B Natriuretic Peptide 318.7 (*)    All other components within normal limits  T4, FREE - Abnormal; Notable for the following components:   Free T4 1.20 (*)    All other components within normal limits  COMPREHENSIVE METABOLIC PANEL - Abnormal; Notable for the following components:   Sodium 122 (*)    Chloride 81 (*)    Glucose, Bld 121 (*)    Calcium 8.7 (*)    Total Protein 6.0 (*)    Total Bilirubin 1.5 (*)    All other components within normal limits  URINALYSIS, ROUTINE W REFLEX MICROSCOPIC - Abnormal; Notable for the following components:   Color, Urine STRAW (*)    Specific Gravity, Urine 1.004 (*)    All other components within normal limits  HEMOGLOBIN A1C - Abnormal; Notable for the following components:   Hgb A1c MFr Bld  6.0 (*)    All other components within normal limits  LIPID PANEL - Abnormal; Notable for the following components:   HDL 32 (*)    All other components within normal limits  COMPREHENSIVE METABOLIC PANEL - Abnormal; Notable for the following components:   Sodium 124 (*)    Chloride 87 (*)    Glucose, Bld 110 (*)    Calcium 7.9 (*)    Total Protein 5.6 (*)    Total Bilirubin 1.6 (*)    All other components within normal limits  SARS CORONAVIRUS 2 (TAT 6-24 HRS)  TSH  HIV ANTIBODY (ROUTINE TESTING W REFLEX)  CBC  CREATININE, SERUM  MAGNESIUM  PHOSPHORUS  COMPREHENSIVE METABOLIC PANEL  CBC  HEPARIN LEVEL (UNFRACTIONATED)  POC SARS CORONAVIRUS 2 AG -  ED  TROPONIN I (HIGH SENSITIVITY)  TROPONIN I (HIGH SENSITIVITY)  TROPONIN I (HIGH SENSITIVITY)    EKG EKG Interpretation  Date/Time:  Monday November 05 2019 14:58:18 EDT Ventricular Rate:  104 PR Interval:    QRS Duration: 149 QT Interval:  367 QTC Calculation: 483 R Axis:   -96 Text Interpretation: Sinus tachycardia with irregular rate/likely afib RBBB and LAFB Probable anteroseptal infarct, old Confirmed by Virgina NorfolkAdam, [place] [id number]) on 11/05/2019 3:47:44 PM   Radiology DG Chest Portable 1 View  Result Date: 11/05/2019 CLINICAL DATA:  64 year old male with increasing shortness of breath x2 weeks. Abdominal and lower extremity edema. Wheezing. Found to be in atrial fibrillation with RVR. EXAM: PORTABLE CHEST 1 VIEW COMPARISON:  CT Abdomen and Pelvis 04/08/2015. FINDINGS: Portable AP semi upright view at 1539 hours. Possible new cardiomegaly since 2016. Other mediastinal contours are within normal limits. Low lung volumes with combined patchy and veiling opacity at the right lung base. Generalized pulmonary vascular congestion. No pneumothorax.  No left lung base opacity. No acute osseous abnormality identified. IMPRESSION: 1. Low lung volumes with suspected pulmonary interstitial edema with right lung base atelectasis and/or small  pleural effusion. 2. Possible cardiomegaly, which would be new since 2016. Electronically Signed   By: Genevie Ann M.D.   On: 11/05/2019 15:54    Procedures Procedures (including critical care time)  Medications Ordered in ED Medications  ondansetron (ZOFRAN) tablet 4 mg (has no administration in time range)    Or  ondansetron (ZOFRAN) injection 4 mg (has no administration in time range)  acetaminophen (TYLENOL) tablet 650 mg (has no administration in time range)    Or  acetaminophen (TYLENOL) suppository 650 mg (has no administration in time range)  furosemide (LASIX) injection 40 mg (40 mg Intravenous Given 11/05/19 2049)  diltiazem (CARDIZEM) 125 mg in dextrose 5% 125 mL (1 mg/mL) infusion (5 mg/hr Intravenous New Bag/Given 11/05/19 2052)  heparin ADULT infusion 100 units/mL (25000 units/242mL sodium chloride 0.45%) (1,550 Units/hr Intravenous New Bag/Given 11/05/19 2103)  furosemide (LASIX) injection 20 mg (20 mg Intravenous Given 11/05/19 1859)  heparin bolus via infusion 2,500 Units (2,500 Units Intravenous Bolus from Bag 11/05/19 2104)    ED Course  I have reviewed the triage vital signs and the nursing notes.  Pertinent labs & imaging results that were available during my care of the patient were reviewed by me and considered in my medical decision making (see chart for details).    MDM Rules/Calculators/A&P                     Patient is a 64 year old male with a PMH of prediabetes and gout presenting to the ED today due to shortness of breath, dyspnea on exertion and cough for the past 1 to 2 weeks.  On exam, patient has abdominal distention, bilateral lower extremity pitting edema and diffuse crackles on auscultation.  BP 124/98, HR 134 and irregular.  RR 14, SPO2 91% on 4 L.  Afebrile.  On arrival, patient appears generally well and is displaying no signs of acute distress while at rest.  He was initially turned off of supplemental O2 and was able to maintain his O2 sats in mid 90s.   However, upon sitting up or any movement, he desats to high 80s and was placed on 2 L nasal cannula.  Patient appears clinically volume overloaded and his chest x-ray is consistent with pulmonary interstitial edema and possible cardiomegaly.  His EKG shows atrial fibrillation with RBBB which are likely new findings as patient reports that he has no such medical problems.  His heart rate ranges from 100-120s.  CBC unremarkable.  Sodium 122, chloride 81, total protein 6.0, T bili 1.5.  TSH and free T4 unremarkable.Initial troponin is 11.  BNP 318.7.  Covid negative.  Based off of presentation and work-up, we have high suspicion that patient is experiencing new onset CHF with subsequent hypoxic respiratory failure.  Atrial fibrillation potentially related to patient's state of volume overload.  Patient given 20 mg IV Lasix.  Patient admitted to internal medicine service.  For details on hospital course following admission, please refer to inpatient team's note.  Patient stable at time of admission.  Patient assessed and evaluated with Dr. Ronnald Nian.  Nadeen Landau, MD   Final Clinical Impression(s) / ED Diagnoses Final diagnoses:  SOB (shortness of breath)  Acute respiratory failure with hypoxia (HCC)  Hypervolemia, unspecified hypervolemia type    Rx / DC Orders ED Discharge Orders  None        Delray Alt, MD 11/06/19 [id number]    Virgina Norfolk, DO 11/06/19 1222

## 2019-11-05 NOTE — ED Provider Notes (Signed)
I have personally seen and examined the patient. I have reviewed the documentation on PMH/FH/Soc Hx. I have discussed the plan of care with the resident and patient.  I have reviewed and agree with the resident's documentation. Please see associated encounter note.  Briefly, the patient is a 64 y.o. male here with shortness of breath, leg swelling.  Patient found to be in atrial fibrillation with EMS.  Was given IV diltiazem.  Heart rate labile between 100-115.  Appears to be irregular rhythm likely A. fib.  Patient has had weight gain, leg swelling, shortness of breath over the last several weeks.  Has not noticed palpitations or chest pain.  Patient smokes occasionally, uses alcohol occasionally but otherwise has no chronic medical problems.  Does not often see Dr.  Tyler Aas appears to be volume overloaded on exam.  Possibly heart failure.  Less likely ACS or PE given history and physical.  Will evaluate for heart failure, volume overload.  Has no abdominal tenderness.  Less likely liver related.  Anticipate admission for diuresis, cardiac work-up.  Does not have any infectious symptoms.  Will obtain Covid testing.  This chart was dictated using voice recognition software.  Despite best efforts to proofread,  errors can occur which can change the documentation meaning.   Richard Graham was evaluated in Emergency Department on 11/05/2019 for the symptoms described in the history of present illness. He was evaluated in the context of the global COVID-19 pandemic, which necessitated consideration that the patient might be at risk for infection with the SARS-CoV-2 virus that causes COVID-19. Institutional protocols and algorithms that pertain to the evaluation of patients at risk for COVID-19 are in a state of rapid change based on information released by regulatory bodies including the CDC and federal and state organizations. These policies and algorithms were followed during the patient's care in the ED.   EKG Interpretation  Date/Time:  Monday November 05 2019 14:58:18 EDT Ventricular Rate:  104 PR Interval:    QRS Duration: 149 QT Interval:  367 QTC Calculation: 483 R Axis:   -96 Text Interpretation: Sinus tachycardia with irregular rate/likely afib RBBB and LAFB Probable anteroseptal infarct, old Confirmed by Virgina Norfolk [phone]) on 11/05/2019 3:47:44 PM         Virgina Norfolk, DO 11/05/19 1747

## 2019-11-05 NOTE — ED Notes (Signed)
POC SARS Coronavirus results of NEGATIVE reported to Dr. Lockie Mola.

## 2019-11-05 NOTE — ED Triage Notes (Signed)
Pt arrived via [facility], stating pt c/o increasing SOB over the last 2 weeks with edema to abdomen and lower legs. [facility] stated he had wheezing in all fields and when they did a 12 Lead they found pt. In Afib RVR heart rate 170-120.  Denies any COPD or asthma. Pt was given Cardizem and Albuterol. HR was 70-130.

## 2019-11-05 NOTE — Consult Note (Signed)
Admit date: 11/05/2019 Referring Physician  Ardean Larsen, MD Primary Physician  Johny Blamer, MD Primary Cardiologist  none Reason for Consultation  New onset atrial fibrillation with CHF  HPI: Richard Graham is a 64 y.o. male who is being seen today for the evaluation of new onset atrial fibrillation and CHF at the request of Ardean Larsen, MD.  This is a 64yo male with a hx of obesity, preDM and gout.  He was in his USOH until about a month ago when he started gaining weight totaling over 30lbs, LE edema, orthopnea and DOE.  He says that he has been quite sedentary recently and attributed his sx to not being as active.  He has not had any Chest pain, tightness or pressure, palpitations or syncope.  There have been several people at his work with COVID but he has not been more than 38ft from anyone and does not think he has been exposed.  He and his wife have not received the vaccine as of yet.  He denies any fever, chills, body aches or chest congestion.  His wife says that he has coughed up some clear phlegm.    His sx became worse today and called EMS.  On arrival he had wheezes on lung exam and was found to be in afib with RVR.  He was given IV Cardizem and Albuterol.  He does have a hx of tobacco use and occasional ETOH use.  Initial POC COVID is negative.  Cxray with CM and interstitial edema with Right lung atelectasis vs. Effusion.  Labs showed Hbg 15.2, BNP 318, hsTrop 11. HbA1C 6%, WBC 8.6, plt count 104K, TSH normal at 1.06 but FT4 slightly elevated at 1.2.  BMET pending.     PMH:   Past Medical History:  Diagnosis Date  . Gout      PSH:   Past Surgical History:  Procedure Laterality Date  . INSERTION OF MESH N/A 06/27/2015   Procedure: INSERTION OF MESH;  Surgeon: Axel Filler, MD;  Location: [facility];  Service: General;  Laterality: N/A;  . laceration to fingers     left hand  . MOUTH SURGERY     secondary to abscess   . trauma to lip      sutured  . VENTRAL  HERNIA REPAIR N/A 06/27/2015   Procedure: LAPAROSCOPIC VENTRAL HERNIA;  Surgeon: Axel Filler, MD;  Location: [facility];  Service: General;  Laterality: N/A;    Allergies:  Patient has no known allergies. Prior to Admit Meds:  (Not in a hospital admission)  Fam HX:   No family history on file. Social HX:    Social History   Socioeconomic History  . Marital status: Married    Spouse name: Not on file  . Number of children: Not on file  . Years of education: Not on file  . Highest education level: Not on file  Occupational History  . Not on file  Tobacco Use  . Smoking status: Former Smoker    Packs/day: 0.25    Years: 3.00    Pack years: 0.75    Types: Cigarettes    Quit date: 08/17/2011    Years since quitting: 8.2  . Smokeless tobacco: Never Used  Substance and Sexual Activity  . Alcohol use: Yes    Comment: rarely  . Drug use: No  . Sexual activity: Not on file  Other Topics Concern  . Not on file  Social History Narrative  . Not on file  Social Determinants of Health   Financial Resource Strain:   . Difficulty of Paying Living Expenses:   Food Insecurity:   . Worried About Programme researcher, broadcasting/film/video in the Last Year:   . Barista in the Last Year:   Transportation Needs:   . Freight forwarder (Medical):   Marland Kitchen Lack of Transportation (Non-Medical):   Physical Activity:   . Days of Exercise per Week:   . Minutes of Exercise per Session:   Stress:   . Feeling of Stress :   Social Connections:   . Frequency of Communication with Friends and Family:   . Frequency of Social Gatherings with Friends and Family:   . Attends Religious Services:   . Active Member of Clubs or Organizations:   . Attends Banker Meetings:   Marland Kitchen Marital Status:   Intimate Partner Violence:   . Fear of Current or Ex-Partner:   . Emotionally Abused:   Marland Kitchen Physically Abused:   . Sexually Abused:      ROS:  All  ROS were addressed and are negative except what is stated in  the HPI  Physical Exam: Blood pressure (!) 124/98, pulse (!) 48, resp. rate (!) 0, height 6\' 1"  (1.854 m), weight 112 kg, SpO2 (!) 89 %.    General: Well developed, well nourished, in no acute distress Head: Eyes PERRLA, No xanthomas.   Normal cephalic and atramatic  Lungs:   Decreased BS throughout and crackles at bases Heart:   Irregularly irregular S1 S2 Pulses are 2+ & equal.            No carotid bruit. No JVD.  No abdominal bruits. No femoral bruits. Abdomen: Bowel sounds are positive, abdomen soft and non-tender without masses or                  Hernia's noted. Msk:  Back normal, normal gait. Normal strength and tone for age. Extremities:   No clubbing, cyanosis.  There is 2+ pitting LE edema up to his knees.  DP +1 Neuro: Alert and oriented X 3. Psych:  Good affect, responds appropriately  Labs:   Lab Results  Component Value Date   WBC 8.6 11/05/2019   HGB 15.2 11/05/2019   HCT 47.0 11/05/2019   MCV 93.3 11/05/2019   PLT 162 11/05/2019   No results for input(s): NA, K, CL, CO2, BUN, CREATININE, CALCIUM, PROT, BILITOT, ALKPHOS, ALT, AST, GLUCOSE in the last 168 hours.  Invalid input(s): LABALBU No results found for: PTT No results found for: INR, PROTIME No results found for: CKTOTAL, CKMB, CKMBINDEX, TROPONINI  No results found for: CHOL No results found for: HDL No results found for: LDLCALC No results found for: TRIG No results found for: CHOLHDL No results found for: LDLDIRECT    Radiology:  DG Chest Portable 1 View  Result Date: 11/05/2019 CLINICAL DATA:  64 year old male with increasing shortness of breath x2 weeks. Abdominal and lower extremity edema. Wheezing. Found to be in atrial fibrillation with RVR. EXAM: PORTABLE CHEST 1 VIEW COMPARISON:  CT Abdomen and Pelvis 04/08/2015. FINDINGS: Portable AP semi upright view at 1539 hours. Possible new cardiomegaly since 2016. Other mediastinal contours are within normal limits. Low lung volumes with combined patchy  and veiling opacity at the right lung base. Generalized pulmonary vascular congestion. No pneumothorax. No left lung base opacity. No acute osseous abnormality identified. IMPRESSION: 1. Low lung volumes with suspected pulmonary interstitial edema with right lung base atelectasis and/or  small pleural effusion. 2. Possible cardiomegaly, which would be new since 2016. Electronically Signed   By: Genevie Ann M.D.   On: 11/05/2019 15:54     Telemetry    Atrial fibrillation with RVR with HR in the 106bpm range - Personally Reviewed  ECG    Atrial fibrillation with RBBB with HR 104bpm  - Personally Reviewed   ASSESSMENT/PLAN:    1.  New onset atrial fibrillation with RVR -unclear duration as he has not had any palpitations -sx of SOB and weight gain have occurred over a 4 week period so suspect he has had afib for a while -HR till in the low 100's at rest but with any movement jumps into the 140's -Bp reportedly soft earlier but now high at 124/47mmHh/ -will start IV Cardizem gtt at 5mg /hr -check 2D echo - if LVF has declined will need to change this to BB or Amio -TSH normal but FT4 slightly elevated ? Significance -his CHADS2VASC score is 1 (new CHF) -will start IV heparin gtt for now until we have echo back and make sure no invasive procedures are required and then transition to [place] -need to follow platelet count carefully as it is 104K -check CBC in am -needs outpt sleep study  2.  New onset CHF -he has had 30lb weight gain in last 3-4 weeks -Cxray c/w interstitial edema -BNP 318 which may be underestimated in setting of obesity -? Related to afib with RVR -check 2D echo in am to assess LVF -he is markedly volume overloaded on exam -BMET still pending -Given Lasix 20mg  IV in ER -would start on Lasix 40mg  IV BID -follow strict I&O's, daily weights and renal function with diuresis  3.  Obesity -I have encouraged him to get into a routine exercise program and cut back on carbs and  portions.    Fransico Him, MD  11/05/2019  7:40 PM

## 2019-11-06 ENCOUNTER — Inpatient Hospital Stay (HOSPITAL_COMMUNITY): Payer: Commercial Managed Care - PPO

## 2019-11-06 DIAGNOSIS — I509 Heart failure, unspecified: Secondary | ICD-10-CM

## 2019-11-06 LAB — BLOOD GAS, ARTERIAL
Acid-Base Excess: 16.8 mmol/L — ABNORMAL HIGH (ref 0.0–2.0)
Acid-Base Excess: 18.4 mmol/L — ABNORMAL HIGH (ref 0.0–2.0)
Bicarbonate: 43.3 mmol/L — ABNORMAL HIGH (ref 20.0–28.0)
Bicarbonate: 44.6 mmol/L — ABNORMAL HIGH (ref 20.0–28.0)
Drawn by: 519031
FIO2: 32
FIO2: 40
O2 Saturation: 93.4 %
O2 Saturation: 93.9 %
Patient temperature: 37
Patient temperature: 37.9
pCO2 arterial: 81.3 mmHg (ref 32.0–48.0)
pCO2 arterial: 81.3 mmHg (ref 32.0–48.0)
pH, Arterial: 7.346 — ABNORMAL LOW (ref 7.350–7.450)
pH, Arterial: 7.364 (ref 7.350–7.450)
pO2, Arterial: 72.7 mmHg — ABNORMAL LOW (ref 83.0–108.0)
pO2, Arterial: 74.6 mmHg — ABNORMAL LOW (ref 83.0–108.0)

## 2019-11-06 LAB — COMPREHENSIVE METABOLIC PANEL
ALT: 21 U/L (ref 0–44)
AST: 21 U/L (ref 15–41)
Albumin: 3.5 g/dL (ref 3.5–5.0)
Alkaline Phosphatase: 60 U/L (ref 38–126)
Anion gap: 10 (ref 5–15)
BUN: 16 mg/dL (ref 8–23)
CO2: 33 mmol/L — ABNORMAL HIGH (ref 22–32)
Calcium: 8.3 mg/dL — ABNORMAL LOW (ref 8.9–10.3)
Chloride: 82 mmol/L — ABNORMAL LOW (ref 98–111)
Creatinine, Ser: 0.9 mg/dL (ref 0.61–1.24)
GFR calc Af Amer: >60 mL/min (ref >60)
GFR calc non Af Amer: >60 mL/min (ref >60)
Glucose, Bld: 134 mg/dL — ABNORMAL HIGH (ref 70–99)
Potassium: 4.2 mmol/L (ref 3.5–5.1)
Sodium: 125 mmol/L — ABNORMAL LOW (ref 135–145)
Total Bilirubin: 1.3 mg/dL — ABNORMAL HIGH (ref 0.3–1.2)
Total Protein: 5.6 g/dL — ABNORMAL LOW (ref 6.5–8.1)

## 2019-11-06 LAB — BASIC METABOLIC PANEL
Anion gap: 13 (ref 5–15)
BUN: 13 mg/dL (ref 8–23)
CO2: 36 mmol/L — ABNORMAL HIGH (ref 22–32)
Calcium: 8.7 mg/dL — ABNORMAL LOW (ref 8.9–10.3)
Chloride: 77 mmol/L — ABNORMAL LOW (ref 98–111)
Creatinine, Ser: 0.86 mg/dL (ref 0.61–1.24)
GFR calc Af Amer: >60 mL/min (ref >60)
GFR calc non Af Amer: >60 mL/min (ref >60)
Glucose, Bld: 129 mg/dL — ABNORMAL HIGH (ref 70–99)
Potassium: 3.7 mmol/L (ref 3.5–5.1)
Sodium: 126 mmol/L — ABNORMAL LOW (ref 135–145)

## 2019-11-06 LAB — CBC
HCT: 45.5 % (ref 39.0–52.0)
Hemoglobin: 14.5 g/dL (ref 13.0–17.0)
MCH: 30.3 pg (ref 26.0–34.0)
MCHC: 31.9 g/dL (ref 30.0–36.0)
MCV: 95.2 fL (ref 80.0–100.0)
Platelets: 175 10*3/uL (ref 150–400)
RBC: 4.78 MIL/uL (ref 4.22–5.81)
RDW: 13.7 % (ref 11.5–15.5)
WBC: 8.3 10*3/uL (ref 4.0–10.5)
nRBC: 0 % (ref 0.0–0.2)

## 2019-11-06 LAB — GLUCOSE, CAPILLARY: Glucose-Capillary: 127 mg/dL — ABNORMAL HIGH (ref 70–99)

## 2019-11-06 LAB — HEPARIN LEVEL (UNFRACTIONATED)
Heparin Unfractionated: 0.74 IU/mL — ABNORMAL HIGH (ref 0.30–0.70)
Heparin Unfractionated: 0.38 IU/mL (ref 0.30–0.70)

## 2019-11-06 LAB — ECHOCARDIOGRAM COMPLETE
Height: 73 in
Weight: 3952 oz

## 2019-11-06 IMAGING — CT CT HEAD W/O CM
3 series · 14 of 47 positions shown, 16 images · non-contrast
Comparison: None.
COMPARISON: None.

Addendum:
CLINICAL DATA: 63-year-old male with acute neurologic deficit.

EXAM:
CT HEAD WITHOUT CONTRAST
TECHNIQUE: Contiguous axial images were obtained from the base of the skull
through the vertex without intravenous contrast.

[Series 3: head 5.0 h30s · axial · 0.47mm/px · z∈[-127,+28]mm · 8 of 37 slices shown, 10 images]
[im 3/37  brain]
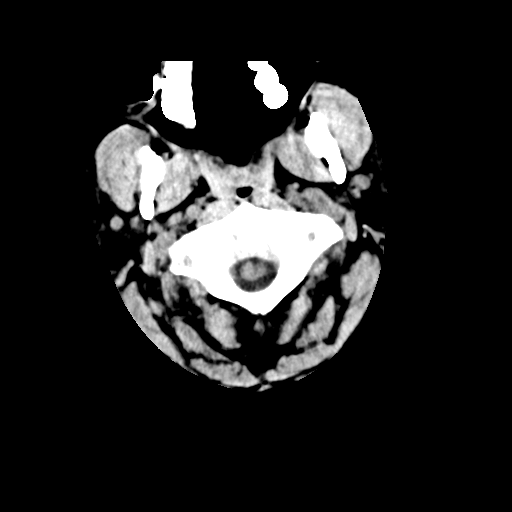
[im 3/37  bone]
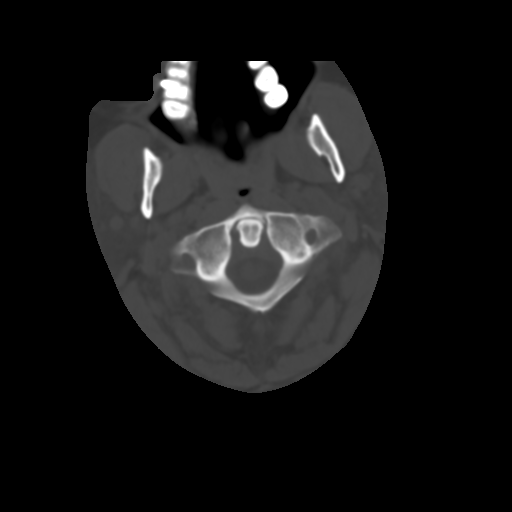
[im 8/37  brain]
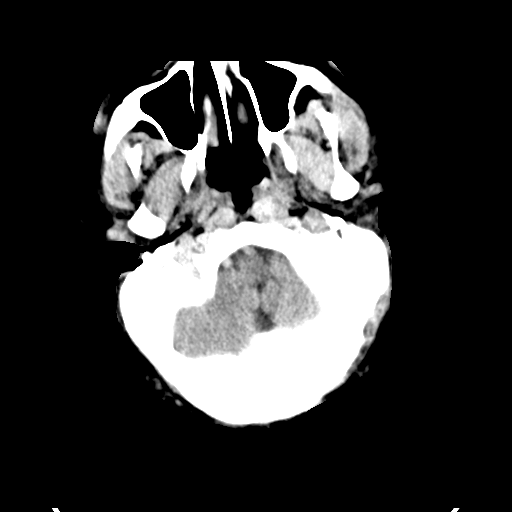
[im 12/37  brain]
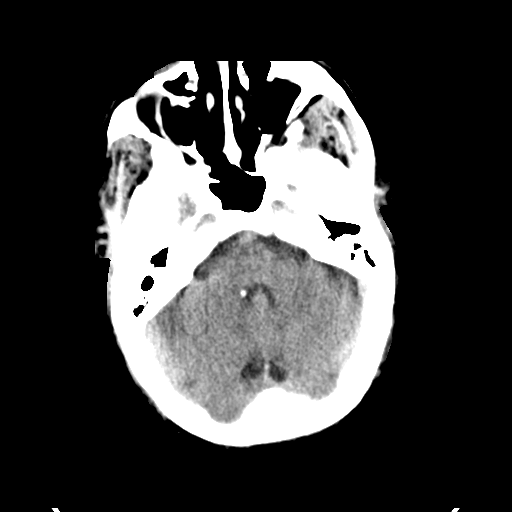
[im 17/37  brain]
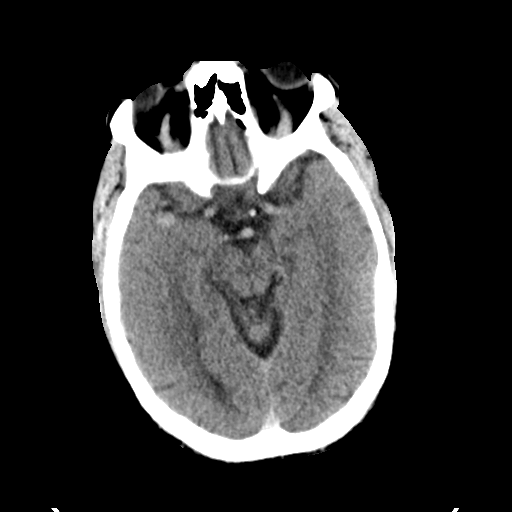
[im 20/37  brain]
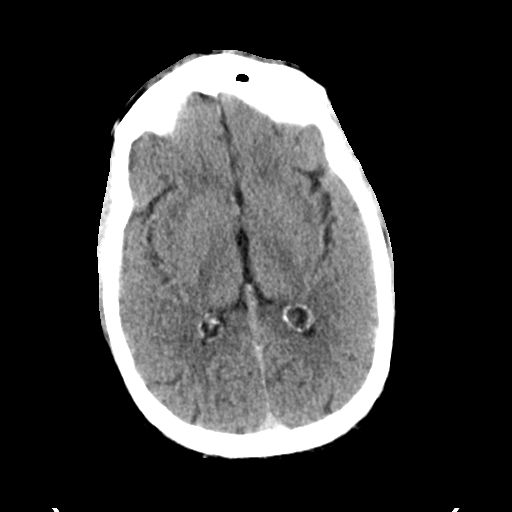
[im 20/37  bone]
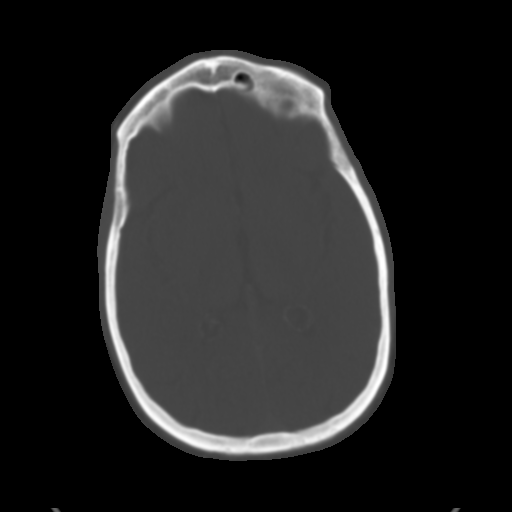
[im 25/37  brain]
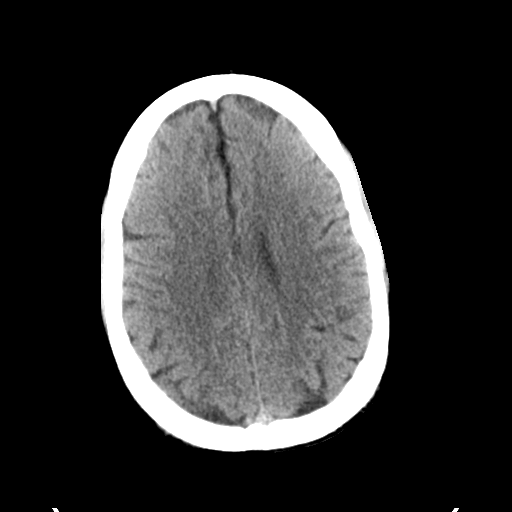
[im 29/37  brain]
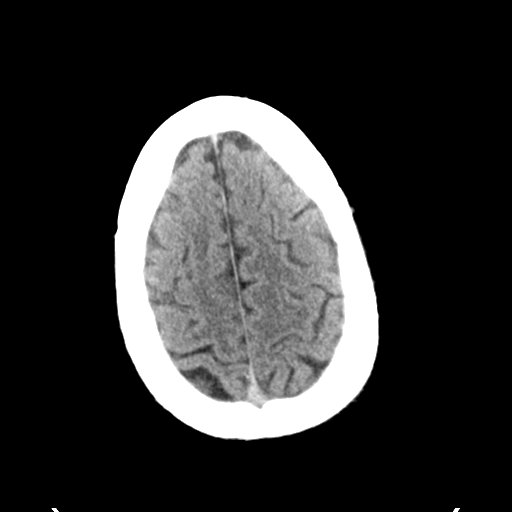
[im 34/37  brain]
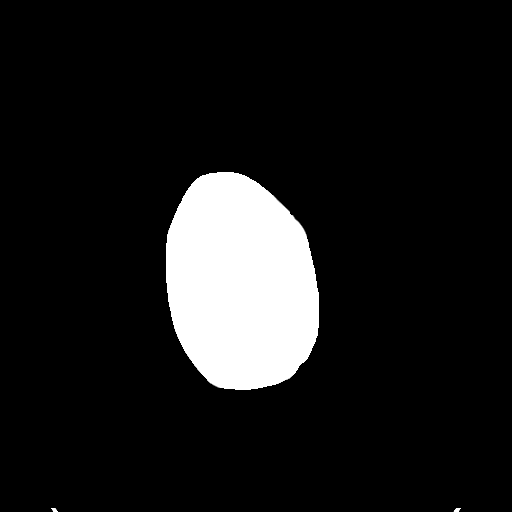

[Series 5: head 3.0 mpr cor · coronal · 0.35mm/px · 3 of 67 slices shown]
[im 23/67  brain]
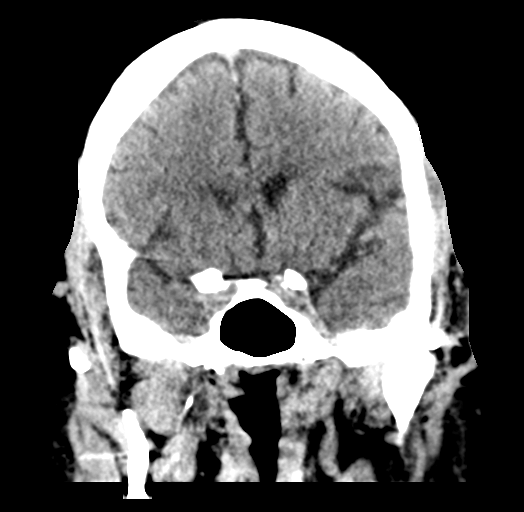
[im 30/67  brain]
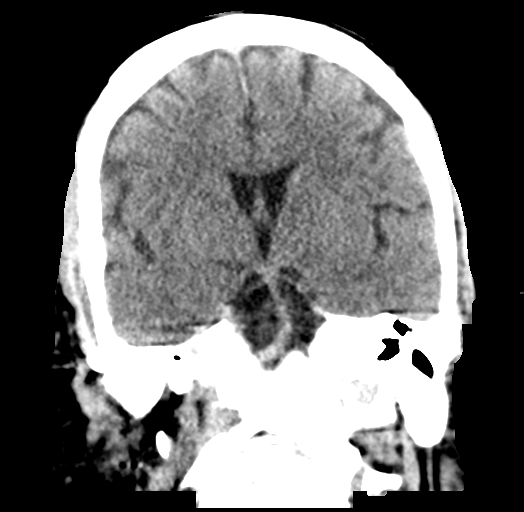
[im 37/67  brain]
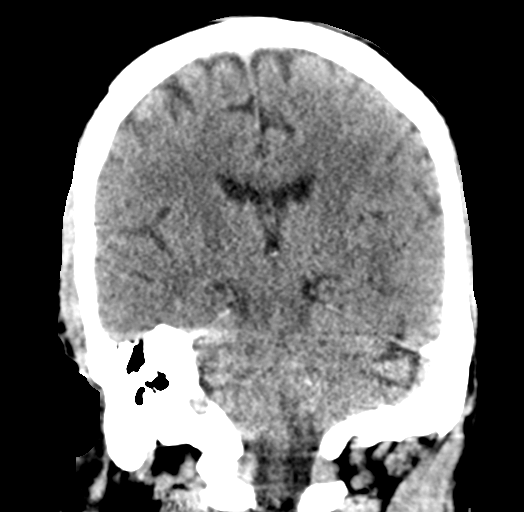

[Series 6: head 3.0 mpr sag · sagittal · 0.35mm/px · 3 of 67 slices shown]
[im 23/67  brain]
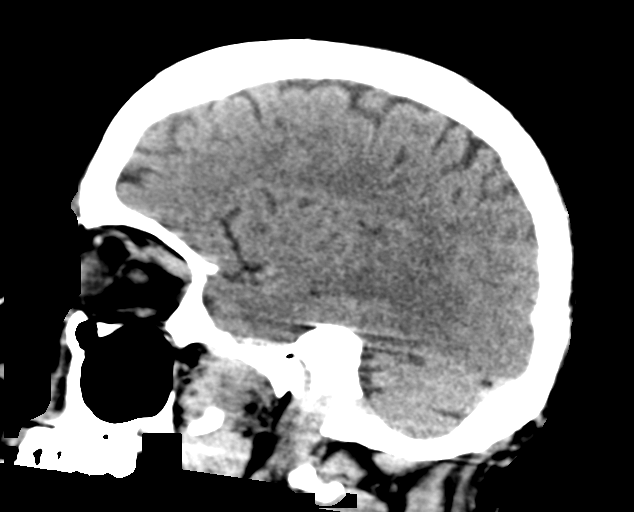
[im 34/67  brain]
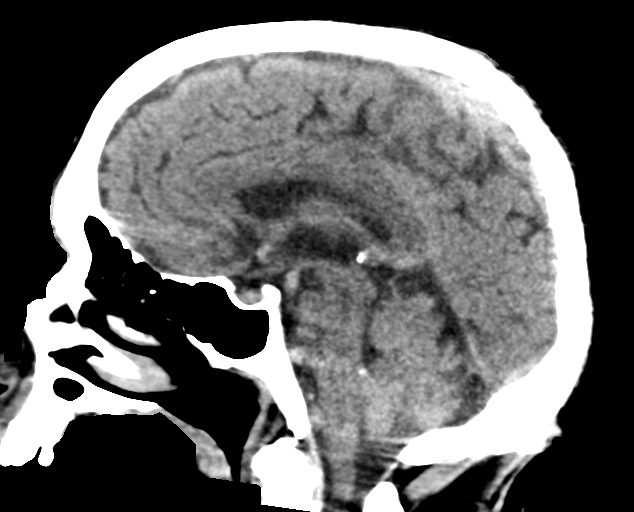
[im 45/67  brain]
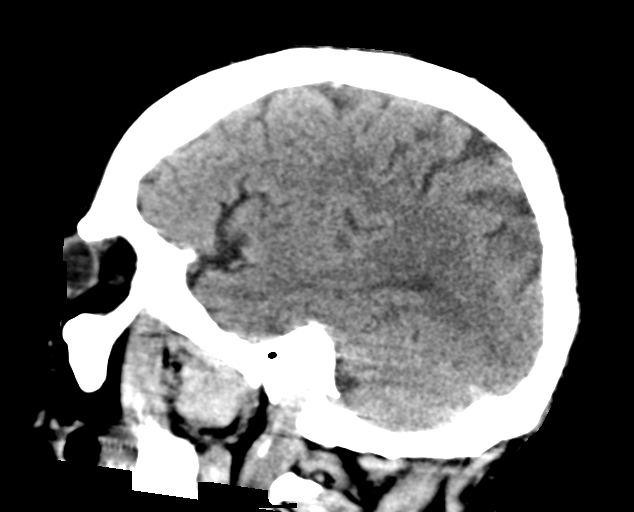

[14 of 47 positions shown; findings below may reference images not displayed]

FINDINGS: Brain: Abnormal rounded area of hypodensity in the anterior right
temporal lobe, although seems to be inseparable from the right MCA
vessels in that region (coronal image 28). No associated temporal
lobe edema. No significant mass effect.

Cerebral volume is within normal limits for age. Normal gray-white
matter differentiation elsewhere in the brain. No ventriculomegaly,
midline shift or intracranial mass effect. No convincing acute
intracranial hemorrhage. No cortically based acute infarct
identified.

Vascular: Mild Calcified atherosclerosis at the skull base.
Suspected large, approximately 10 mm saccular aneurysm of the right
MCA bifurcation corresponding to the right temporal lobe
hyperdensity described above. No other No suspicious intracranial
vascular hyperdensity.

Skull: Negative.

Sinuses/Orbits: Visualized paranasal sinuses and mastoids are clear.

Other: Visualized orbits and scalp soft tissues are within normal
limits.
IMPRESSION: 1. Plain CT appearance suspicious for a Large Right MCA bifurcation
aneurysm, estimated at 10 mm. No acute identified to suggest
aneurysm rupture. And otherwise negative noncontrast CT appearance
of the brain.

2. Considering symptoms of acute neurologic deficit, follow-up
noncontrast Head MRI and Head MRA may be the best next imaging step.

ADDENDUM:
Study discussed by telephone with Provider BERTHYSWEETY on [DATE] at
[MO] hours.

*** End of Addendum ***
FINDINGS: Brain: Abnormal rounded area of hypodensity in the anterior right
temporal lobe, although seems to be inseparable from the right MCA
vessels in that region (coronal image 28). No associated temporal
lobe edema. No significant mass effect.

Cerebral volume is within normal limits for age. Normal gray-white
matter differentiation elsewhere in the brain. No ventriculomegaly,
midline shift or intracranial mass effect. No convincing acute
intracranial hemorrhage. No cortically based acute infarct
identified.

Vascular: Mild Calcified atherosclerosis at the skull base.
Suspected large, approximately 10 mm saccular aneurysm of the right
MCA bifurcation corresponding to the right temporal lobe
hyperdensity described above. No other No suspicious intracranial
vascular hyperdensity.

Skull: Negative.

Sinuses/Orbits: Visualized paranasal sinuses and mastoids are clear.

Other: Visualized orbits and scalp soft tissues are within normal
limits.
IMPRESSION: 1. Plain CT appearance suspicious for a Large Right MCA bifurcation
aneurysm, estimated at 10 mm. No acute identified to suggest
aneurysm rupture. And otherwise negative noncontrast CT appearance
of the brain.

2. Considering symptoms of acute neurologic deficit, follow-up
noncontrast Head MRI and Head MRA may be the best next imaging step.

## 2019-11-06 MED ORDER — FUROSEMIDE 10 MG/ML IJ SOLN
80.0000 mg | Freq: Two times a day (BID) | INTRAMUSCULAR | Status: DC
  Administered 2019-11-06 (×2): 80 mg via INTRAVENOUS
  Filled 2019-11-06 (×2): qty 8

## 2019-11-06 MED ORDER — DIGOXIN 0.25 MG/ML IJ SOLN
0.2500 mg | Freq: Every day | INTRAMUSCULAR | Status: AC
  Administered 2019-11-07: 0.25 mg via INTRAVENOUS
  Filled 2019-11-06: qty 2

## 2019-11-06 MED ORDER — FUROSEMIDE 10 MG/ML IJ SOLN: 60.0000 mg | Freq: Two times a day (BID) | INTRAMUSCULAR | Status: DC

## 2019-11-06 MED ORDER — PERFLUTREN LIPID MICROSPHERE
1.0000 mL | INTRAVENOUS | Status: AC | PRN
  Administered 2019-11-06: 15:00:00 5 mL via INTRAVENOUS
  Filled 2019-11-06: qty 10

## 2019-11-06 NOTE — Progress Notes (Signed)
ANTICOAGULATION CONSULT NOTE - Follow Up Consult  Pharmacy Consult for heparin Indication: atrial fibrillation  No Known Allergies  Patient Measurements: Height: 6\' 1"  (185.4 cm) Weight: 247 lb (112 kg) IBW/kg (Calculated) : 79.9 Heparin Dosing Weight: 103.5  Vital Signs: BP: 108/76 (03/23 0730) Pulse Rate: 115 (03/23 0730)  Labs: Recent Labs    11/05/19 1616 11/05/19 1616 11/05/19 1721 11/05/19 1731 11/05/19 1822 11/05/19 2053 11/06/19 0312 11/06/19 0701  HGB 15.3   < >  --   --  15.2  --  14.5  --   HCT 47.0  --   --   --  47.0  --  45.5  --   PLT 104*  --   --   --  162  --  175  --   HEPARINUNFRC  --   --   --   --   --   --   --  0.74*  CREATININE  --    < >   < >  --  0.76 0.79 0.90  --   TROPONINIHS  --   --   --  11 14 12   --   --    < > = values in this interval not displayed.    Estimated Creatinine Clearance: 113 mL/min (by C-G formula based on SCr of 0.9 mg/dL).   Medical History: Past Medical History:  Diagnosis Date  . Gout     Medications:  Scheduled:  . furosemide  40 mg Intravenous BID   Infusions:  . diltiazem (CARDIZEM) infusion 5 mg/hr (11/05/19 2052)  . heparin 1,550 Units/hr (11/05/19 2103)    Assessment: Richard Graham is a 64 y/o M presenting with new onset atrial fibrillation with CHF. He is not on any anticoagulation PTA. His CHADS2VASC score is 1 (new CHF).  Currently on IV heparin at 1550 units/hr. Initial HL is supra-therapeutic at 0.74. H/H and Plt wnl.   Goal of Therapy:  Heparin level 0.3-0.7 units/ml Monitor platelets by anticoagulation protocol: Yes   Plan:  Decrease IV heparin to 1450 units/hr  Check anti-Xa level in 6 hours and daily while on heparin Continue to monitor H&H and platelets  Lynnea Maizes, PharmD., BCPS Clinical Pharmacist Clinical phone for 11/06/19 until 3:30pm: [phone] If after 3:30pm, please refer to [facility] for unit-specific pharmacist

## 2019-11-06 NOTE — ED Notes (Signed)
Empty patient foley bag patient voided 2000 output of urine

## 2019-11-06 NOTE — Progress Notes (Addendum)
Progress Note  Patient Name: Richard Graham Date of Encounter: 11/06/2019  Primary Cardiologist: New   Subjective   Still very volume overloaded. Mildly confused this AM. Denies CP, SOB   Inpatient Medications    Scheduled Meds: . furosemide  40 mg Intravenous BID   Continuous Infusions: . diltiazem (CARDIZEM) infusion 5 mg/hr (11/05/19 2052)  . heparin 1,550 Units/hr (11/05/19 2103)   PRN Meds: acetaminophen **OR** acetaminophen, ondansetron **OR** ondansetron (ZOFRAN) IV   Vital Signs    Vitals:   11/06/19 0430 11/06/19 0445 11/06/19 0500 11/06/19 0515  BP: 117/87  109/82   Pulse: (!) 115 (!) 108 94 (!) 104  Resp:      SpO2: 93% 94% (!) 83% (!) 89%  Weight:      Height:       No intake or output data in the 24 hours ending 11/06/19 0634 Filed Weights   11/05/19 1515  Weight: 112 kg    Physical Exam   General: Obese, NAD Head: Normocephalic, atraumatic, sclera non-icteric, no xanthomas, clear, moist mucus membranes. Neck: Negative for carotid bruits. No JVD Lungs:Clear to ausculation bilaterally. Breathing is unlabored. Cardiovascular: Irregularly irregular with S1 S2. No murmurs Abdomen: Soft, non-tender, distended. No obvious abdominal masses. Extremities: 3+ BLE edema. Radial pulses 2+ bilaterally Neuro: Alert and oriented to person and place. Slightly confused about current situation. MAE spontaneously. Psych: Responds to questions somewhat appropriately with normal affect.    Labs    Chemistry Recent Labs  Lab 11/05/19 1721 11/05/19 1721 11/05/19 1822 11/05/19 2053 11/06/19 0312  NA 122*  --   --  124* 125*  K 5.0  --   --  4.6 4.2  CL 81*  --   --  87* 82*  CO2 28  --   --  27 33*  GLUCOSE 121*  --   --  110* 134*  BUN 17  --   --  16 16  CREATININE 0.77   < > 0.76 0.79 0.90  CALCIUM 8.7*  --   --  7.9* 8.3*  PROT 6.0*  --   --  5.6* 5.6*  ALBUMIN 3.8  --   --  3.5 3.5  AST 30  --   --  34 21  ALT 23  --   --  19 21  ALKPHOS  63  --   --  58 60  BILITOT 1.5*  --   --  1.6* 1.3*  GFRNONAA >60   < > >60 >60 >60  GFRAA >60   < > >60 >60 >60  ANIONGAP 13  --   --  10 10   < > = values in this interval not displayed.     Hematology Recent Labs  Lab 11/05/19 1616 11/05/19 1822 11/06/19 0312  WBC 8.6 8.6 8.3  RBC 5.05 5.04 4.78  HGB 15.3 15.2 14.5  HCT 47.0 47.0 45.5  MCV 93.1 93.3 95.2  MCH 30.3 30.2 30.3  MCHC 32.6 32.3 31.9  RDW 13.9 13.9 13.7  PLT 104* 162 175    Cardiac EnzymesNo results for input(s): TROPONINI in the last 168 hours. No results for input(s): TROPIPOC in the last 168 hours.   BNP Recent Labs  Lab 11/05/19 1616  BNP 318.7*     DDimer No results for input(s): DDIMER in the last 168 hours.   Radiology    DG Chest Portable 1 View  Result Date: 11/05/2019 CLINICAL DATA:  64 year old male with increasing shortness  of breath x2 weeks. Abdominal and lower extremity edema. Wheezing. Found to be in atrial fibrillation with RVR. EXAM: PORTABLE CHEST 1 VIEW COMPARISON:  CT Abdomen and Pelvis 04/08/2015. FINDINGS: Portable AP semi upright view at 1539 hours. Possible new cardiomegaly since 2016. Other mediastinal contours are within normal limits. Low lung volumes with combined patchy and veiling opacity at the right lung base. Generalized pulmonary vascular congestion. No pneumothorax. No left lung base opacity. No acute osseous abnormality identified. IMPRESSION: 1. Low lung volumes with suspected pulmonary interstitial edema with right lung base atelectasis and/or small pleural effusion. 2. Possible cardiomegaly, which would be new since 2016. Electronically Signed   By: Genevie Ann M.D.   On: 11/05/2019 15:54   Telemetry     3/33/21 AF with RBBB HR 100's - Personally Reviewed  ECG    No new tracing as of 11/06/19- Personally Reviewed  Cardiac Studies   Echocardiogram 11/06/19: Pending   Patient Profile     64 y.o. male with a history of obesity, pre-DM 2, gout and new onset atrial  fibrillation with CHF  Assessment & Plan    1.  New onset atrial fibrillation with RVR -Presented with SOB and weight gain (40-50lb) for the last 4 weeks however patient suspects he has been in AF for a while  -IV Cardizem gtt at 5mg /hr initiated with intermittent rate control in the 90-100's=. Titration limited by soft BP  -Echocardiogram ordered however is pending>>>if LVF has declined will need to change this to BB or Amio -CHADS2VASC score is 1 (new CHF) -IV heparin gtt for now until echocardiogram results  2.  New onset CHF -Reports a 40-50lb weight gain in last 3-4 weeks -CXR with interstitial edema on presentation  -BNP 318 which may be underestimated in setting of obesity -Presumably related to afib with RVR -Given Lasix 20mg  IV in ER>>started on IV Lasix 40mg  BID  -Weight, 247lb on admission  -I&O, no UO recorded  -Creatinine, 0.90 today   3.  Obesity -BMI, 32.59   Signed, Kathyrn Drown NP-C [facility] Pager: [phone] 11/06/2019, 6:34 AM     For questions or updates, please contact   Please consult [email or web address] for contact info under Cardiology/STEMI.  Patient seen and examined with Kathyrn Drown NP-C.  Agree as above, with the following exceptions and changes as noted below. Patient still grossly volume overloaded, short of breath. No CP. Gen: NAD, CV: RRR, no murmurs, JVP challenging to assess, appears at mid 1/3 of neck at 45 deg, Lungs: diminished in bases bilaterally, crackles to mid lung, Abd: soft, distended but not tense, Extrem: Warm, well perfused, 2-3+ bilateral pitting edema to the thigh edema, Neuro/Psych: alert and oriented x 3, normal mood and affect. All available labs, radiology testing, previous records reviewed.  1. Afib RVR, rates better controlled. On dilt but may need transition due to HF. On IV heparin.  2. Acute heart failure - echo pending for systolic function. Continue lasix, increase to 80 mg IV bid as patient does not feel he had a  significant response with lower dose. Needs aggressive diuresis.   Elouise Munroe 11/06/19 9:39 AM

## 2019-11-06 NOTE — Progress Notes (Signed)
RT NOTES:  ABG obtained and sent to lab. Lab tech Catherine notified.  

## 2019-11-06 NOTE — ED Notes (Signed)
Lunch Tray Ordered @ 1028. 

## 2019-11-06 NOTE — Progress Notes (Signed)
Spoke with Dr Margo Aye regarding Head CT results.  Findings: Plain CT appearance suspicious for a Large Right MCA bifurcation aneurysm, estimated at 10 mm. No acute identified to suggest aneurysm rupture.  Recommended Head MRA w/o contrast and Head MRA

## 2019-11-06 NOTE — Significant Event (Signed)
Rapid Response Event Note  Overview: Time Called: 1735 Arrival Time: 1738 Event Type: Neurologic  Initial Focused Assessment: Patient recently arrived on department from ED per RN he was fully alert and oriented when he arrived.  He is currently difficult to arouse.  Will wake/react to painful stim.  Slurred speech but equally weak bilaterally. Awoke more after stimulation, speech clear.  Falls rapidly back asleep CBG 127 BP 110/74  AF 114  RR 16  O2 sat 95% on 3L [place]  Interventions: ABG  7.34/81/72/43 Labs Stat head CT  Placed on Bipap 16/8 40%  Plan of Care (if not transferred):  Event Summary: Name of Physician Notified: Lama at 1750    at          [place]

## 2019-11-06 NOTE — ED Notes (Signed)
Tele   Breakfast ordered  

## 2019-11-06 NOTE — Progress Notes (Signed)
ANTICOAGULATION CONSULT NOTE - Follow Up Consult  Pharmacy Consult for heparin Indication: atrial fibrillation  No Known Allergies  Patient Measurements: Height: 6\' 4"  (193 cm) Weight: 272 lb 0.8 oz (123.4 kg) IBW/kg (Calculated) : 86.8 Heparin Dosing Weight: 103.5  Vital Signs: Temp: 98.6 F (37 C) (03/23 1621) Temp Source: Oral (03/23 1621) BP: 120/69 (03/23 1621) Pulse Rate: 131 (03/23 1621)  Labs: Recent Labs    11/05/19 1616 11/05/19 1616 11/05/19 1721 11/05/19 1731 11/05/19 1822 11/05/19 2053 11/06/19 0312 11/06/19 0701 11/06/19 1623  HGB 15.3   < >  --   --  15.2  --  14.5  --   --   HCT 47.0  --   --   --  47.0  --  45.5  --   --   PLT 104*  --   --   --  162  --  175  --   --   HEPARINUNFRC  --   --   --   --   --   --   --  0.74* 0.38  CREATININE  --    < >   < >  --  0.76 0.79 0.90  --   --   TROPONINIHS  --   --   --  11 14 12   --   --   --    < > = values in this interval not displayed.    Estimated Creatinine Clearance: 123.6 mL/min (by C-G formula based on SCr of 0.9 mg/dL).   Medical History: Past Medical History:  Diagnosis Date  . Gout     Assessment: Mr Richard Graham is a 64 yr old male presenting with new onset atrial fibrillation with CHF. He is not on any anticoagulation PTA. His CHADS2VASC score is 1 (new CHF).  Heparin level ~8 hrs after heparin infusion was decreased to 1450 units/hr is 0.38 units/ml, which is within the goal range for this pt. CBC WNL. Per RN, no issues with IV or bleeding.    Goal of Therapy:  Heparin level 0.3-0.7 units/ml Monitor platelets by anticoagulation protocol: Yes   Plan:  Continue IV heparin at 1450 units/hr  Check confirmatory heparin level in 6 hours Monitor daily heparin level, CBC Monitor for signs/symptoms of bleeding  Lynnea Maizes, PharmD, BCPS, [facility] Clinical Pharmacist 11/06/19, 17:20 PM

## 2019-11-06 NOTE — ED Notes (Signed)
hospitalist at bedside

## 2019-11-06 NOTE — Progress Notes (Signed)
[facility]  PROGRESS NOTE  Richard Graham KXF:[id number] DOB: 09-Apr-1956 DOA: 11/05/2019 PCP: Johny Blamer, MD   Brief HPI:   64 year old male with a history of prediabetes, obesity, gout came to ED with worsening shortness of breath and leg swelling for past 3 weeks.  Patient said he gained 3 pounds weight over past 3 weeks.  Also had orthopnea, lower extremity edema, poor appetite.  Denies chest pain or palpitations, no fever or chills. Patient was found to be in A. fib with RVR with rate of 170s to 200s.  He received 10 mg of Cardizem and 5 mg of albuterol.  Was placed on 4 L/min of oxygen via nasal cannula. BNP 318.7.  Chest x-ray showed pulmonary edema.  Patient was admitted with a diagnosis of new onset A. fib and congestive heart failure.  Subjective   Patient seen and examined, breathing has improved.  He was seen by cardiology, started on IV Lasix.   Assessment/Plan:     1. Acute CHF exacerbation-patient presenting with acute CHF, chest x-ray showed pulmonary edema.  TSH normal.  BNP 318.  Patient started on IV Lasix 80 mg every 12 hours.  Cardiology has been consulted.  2D echo obtained and is currently pending.  Follow BMP in a.m. 2. New onset atrial fibrillation-could be secondary to undiagnosed OSA.  2D echo has been obtained as above.  We will follow the results. CHA2DS2VASc score is 1.  Patient started on Cardizem gtt.  Heparin GTT for anticoagulation. 3. Hyponatremia-sodium is 125, improved from yesterday 122.  Likely hypervolemic hyponatremia from underlying CHF.  Continue diuresis with Lasix.  Follow BMP in a.m.     SpO2: 96 %   COVID-19 Labs  Lab Results  Component Value Date   SARSCOV2NAA NEGATIVE 11/05/2019     CBG: No results for input(s): GLUCAP in the last 168 hours.  CBC: Recent Labs  Lab 11/05/19 1616 11/05/19 1822 11/06/19 0312  WBC 8.6 8.6 8.3  HGB 15.3 15.2 14.5  HCT 47.0 47.0 45.5  MCV 93.1 93.3 95.2  PLT 104* 162 175     Basic Metabolic Panel: Recent Labs  Lab 11/05/19 1721 11/05/19 1822 11/05/19 2053 11/06/19 0312  NA 122*  --  124* 125*  K 5.0  --  4.6 4.2  CL 81*  --  87* 82*  CO2 28  --  27 33*  GLUCOSE 121*  --  110* 134*  BUN 17  --  16 16  CREATININE 0.77 0.76 0.79 0.90  CALCIUM 8.7*  --  7.9* 8.3*  MG  --  2.1  --   --   PHOS  --  4.0  --   --      Liver Function Tests: Recent Labs  Lab 11/05/19 1721 11/05/19 2053 11/06/19 0312  AST 30 34 21  ALT 23 19 21   ALKPHOS 63 58 60  BILITOT 1.5* 1.6* 1.3*  PROT 6.0* 5.6* 5.6*  ALBUMIN 3.8 3.5 3.5        DVT prophylaxis: Heparin  Code Status: Full code  Family Communication: No family at bedside  Disposition Plan: Patient admitted with new onset A. fib, congestive heart failure, pulmonary edema.  Barrier to discharge-ongoing IV diuresis with Lasix for CHF exacerbation.     Scheduled medications:  . furosemide  80 mg Intravenous BID    Consultants:  Cardiology  Antibiotics:   Anti-infectives (From admission, onward)   None       Objective   Vitals:  11/06/19 0800 11/06/19 0900 11/06/19 0930 11/06/19 1030  BP: 106/83 (!) 121/94 122/76   Pulse: (!) 51 98 (!) 51 (!) 106  Resp: (!) 23 (!) 21 (!) 22 (!) 21  SpO2:  96%  96%  Weight:      Height:         Filed Weights   11/05/19 1515  Weight: 112 kg    Physical Examination:    General-appears in no acute distress  Heart-S1-S2, regular, no murmur auscultated  Lungs-crackles auscultated at lung bases  Abdomen-soft, nontender, no organomegaly  Extremities-2+ pitting edema of the lower extremities  Neuro-alert, oriented x3, no focal deficit noted    Data Reviewed:   Recent Results (from the past 240 hour(s))  SARS CORONAVIRUS 2 (TAT 6-24 HRS) Nasopharyngeal Nasopharyngeal Swab     Status: None   Collection Time: 11/05/19  6:56 PM   Specimen: Nasopharyngeal Swab  Result Value Ref Range Status   SARS Coronavirus 2 NEGATIVE NEGATIVE  Final    Comment: (NOTE) SARS-CoV-2 target nucleic acids are NOT DETECTED. The SARS-CoV-2 RNA is generally detectable in upper and lower respiratory specimens during the acute phase of infection. Negative results do not preclude SARS-CoV-2 infection, do not rule out co-infections with other pathogens, and should not be used as the sole basis for treatment or other patient management decisions. Negative results must be combined with clinical observations, patient history, and epidemiological information. The expected result is Negative. Fact Sheet for Patients: HairSlick.no Fact Sheet for Healthcare Providers: quierodirigir.com This test is not yet approved or cleared by the [place] FDA and  has been authorized for detection and/or diagnosis of SARS-CoV-2 by FDA under an Emergency Use Authorization (EUA). This EUA will remain  in effect (meaning this test can be used) for the duration of the COVID-19 declaration under Section 56 4(b)(1) of the Act, 21 U.S.C. section 360bbb-3(b)(1), unless the authorization is terminated or revoked sooner. Performed at [facility], [address] [place], [place] [address]     No results for input(s): LIPASE, AMYLASE in the last 168 hours. No results for input(s): AMMONIA in the last 168 hours.  Cardiac Enzymes: No results for input(s): CKTOTAL, CKMB, CKMBINDEX, TROPONINI in the last 168 hours. BNP (last 3 results) Recent Labs    11/05/19 1616  BNP 318.7*     Studies:  DG Chest Portable 1 View  Result Date: 11/05/2019 CLINICAL DATA:  64 year old male with increasing shortness of breath x2 weeks. Abdominal and lower extremity edema. Wheezing. Found to be in atrial fibrillation with RVR. EXAM: PORTABLE CHEST 1 VIEW COMPARISON:  CT Abdomen and Pelvis 04/08/2015. FINDINGS: Portable AP semi upright view at 1539 hours. Possible new cardiomegaly since 2016. Other mediastinal contours  are within normal limits. Low lung volumes with combined patchy and veiling opacity at the right lung base. Generalized pulmonary vascular congestion. No pneumothorax. No left lung base opacity. No acute osseous abnormality identified. IMPRESSION: 1. Low lung volumes with suspected pulmonary interstitial edema with right lung base atelectasis and/or small pleural effusion. 2. Possible cardiomegaly, which would be new since 2016. Electronically Signed   By: Odessa Fleming M.D.   On: 11/05/2019 15:54     Admission status: The appropriate admission status for this patient is INPATIENT. Inpatient status is judged to be reasonable and necessary in order to provide the required intensity of service to ensure the patient's safety. The patient's presenting symptoms, physical exam findings, and initial radiographic and laboratory data in the context  of their chronic comorbidities is felt to place them at high risk for further clinical deterioration. Furthermore, it is not anticipated that the patient will be medically stable for discharge from the hospital within 2 midnights of admission. The following factors support the admission status of inpatient.    The patient's presenting symptoms include dyspnea, weight gain The worrisome physical exam findings include lower extremity edema, bilateral crackles. The initial radiographic and laboratory data are worrisome because of CHF exacerbation. The chronic co-morbidities include obesity    * I certify that at the point of admission it is my clinical judgment that the patient will require inpatient hospital care spanning beyond 2 midnights from the point of admission due to high intensity of service, high risk for further deterioration and high frequency of surveillance required.Oswald Hillock   [facility] If 7PM-7AM, please contact night-coverage at [email or web address], Office  [phone]   11/06/2019, 11:28 AM  LOS: 1 day

## 2019-11-06 NOTE — Progress Notes (Signed)
Patient disoriented, does not follow commands, only can answer every other question not completely.

## 2019-11-06 NOTE — Progress Notes (Signed)
  ReDS Clip Diuretic Study Pt study # [id number]  Your patient has been enrolled in the ReDS Clip Diuretic Study   Changes to prescribed diuretics recommended:  Agree with furosemide 80mg  IV bid Provider contacted:  Recommendation     REDS Clip  READING= 47  CHEST RULER = 38 Clip Station = D   Orthodema score =  Signs/Symptoms Score   Mild edema, no orthopnea 0 No congestion  Moderate edema, no orthopnea 1 Low-grade orthodema/congestion  Severe edema OR orthopnea 2   Moderate edema and orthopnea 3 High-grade orthodema/congestion  Severe edema AND orthopnea 4      Pharm.D. CPP, BCPS Clinical Pharmacist [phone] 11/06/2019 4:08 PM

## 2019-11-06 NOTE — ED Notes (Signed)
Edwyna Perfect sister [phone]

## 2019-11-06 NOTE — Progress Notes (Signed)
  Echocardiogram 2D Echocardiogram has been performed.  Delcie Roch 11/06/2019, 2:47 PM

## 2019-11-06 NOTE — Progress Notes (Signed)
RT NOTES: Patient placed on bipap per MD orders and critical ABG. Tolerating well. Will continue to monitor.

## 2019-11-06 NOTE — Progress Notes (Addendum)
Called by RN that patient arrived on the floor and was somnolent, hard to arouse.  Earlier patient was admitted with acute CHF exacerbation and received high-dose Lasix 80 mg IV every 12 hours.  Also patient is on IV heparin per pharmacy.  Patient is diuresing well.  Vital signs are stable. On exam Patient was somnolent but arousable, follows commands. Moving all extremities Oriented x3  Assessment Hypersomnolence?  Etiology  Stat ABG Stat CT head  ABG showed PCO2 of 81, bicarb 43 Consistent with primary metabolic alkalosis with primary respiratory acidosis Patient may have underlying COPD/OSA Hold IV Lasix for tonight Stat BMP to assess renal function BiPAP per respiratory therapy  Critical care time spent 35 minutes

## 2019-11-06 NOTE — Progress Notes (Signed)
Spoke with Murrell Redden, she gave consent to add contact Info,  Merlyn Albert -Pt's Sister. Updated Them both about Pt situation.   Pt still on Bipap, wakes up sometimes and pulls Bipap Mask off then goes back to sleep. Will monitor. BP 102/63   Pulse 95   Temp 98.6 F (37 C)   Resp 20   Ht 6\' 4"  (1.93 m)   Wt 123.4 kg   SpO2 96%   BMI 33.11 kg/m

## 2019-11-07 ENCOUNTER — Inpatient Hospital Stay (HOSPITAL_COMMUNITY): Payer: Commercial Managed Care - PPO

## 2019-11-07 DIAGNOSIS — I729 Aneurysm of unspecified site: Secondary | ICD-10-CM

## 2019-11-07 DIAGNOSIS — I5041 Acute combined systolic (congestive) and diastolic (congestive) heart failure: Secondary | ICD-10-CM

## 2019-11-07 LAB — LIPID PANEL
Cholesterol: 103 mg/dL (ref 0–200)
HDL: 40 mg/dL — ABNORMAL LOW (ref >40)
LDL Cholesterol: 56 mg/dL (ref 0–99)
Total CHOL/HDL Ratio: 2.6 RATIO
Triglycerides: 36 mg/dL (ref <150)
VLDL: 7 mg/dL (ref 0–40)

## 2019-11-07 LAB — CBC
HCT: 47.4 % (ref 39.0–52.0)
Hemoglobin: 14.9 g/dL (ref 13.0–17.0)
MCH: 30.3 pg (ref 26.0–34.0)
MCHC: 31.4 g/dL (ref 30.0–36.0)
MCV: 96.5 fL (ref 80.0–100.0)
Platelets: 182 10*3/uL (ref 150–400)
RBC: 4.91 MIL/uL (ref 4.22–5.81)
RDW: 13.9 % (ref 11.5–15.5)
WBC: 8.8 10*3/uL (ref 4.0–10.5)
nRBC: 0 % (ref 0.0–0.2)

## 2019-11-07 LAB — BASIC METABOLIC PANEL
Anion gap: 9 (ref 5–15)
BUN: 13 mg/dL (ref 8–23)
CO2: 42 mmol/L — ABNORMAL HIGH (ref 22–32)
Calcium: 8.6 mg/dL — ABNORMAL LOW (ref 8.9–10.3)
Chloride: 82 mmol/L — ABNORMAL LOW (ref 98–111)
Creatinine, Ser: 0.86 mg/dL (ref 0.61–1.24)
GFR calc Af Amer: >60 mL/min (ref >60)
GFR calc non Af Amer: >60 mL/min (ref >60)
Glucose, Bld: 123 mg/dL — ABNORMAL HIGH (ref 70–99)
Potassium: 3.8 mmol/L (ref 3.5–5.1)
Sodium: 133 mmol/L — ABNORMAL LOW (ref 135–145)

## 2019-11-07 LAB — HEPARIN LEVEL (UNFRACTIONATED)
Heparin Unfractionated: 0.27 IU/mL — ABNORMAL LOW (ref 0.30–0.70)
Heparin Unfractionated: 0.35 IU/mL (ref 0.30–0.70)

## 2019-11-07 IMAGING — MR MR HEAD W/O CM
7 of 11 series · 25 of 48 positions shown · non-contrast
Comparison: intracranial MRA today reported separately. Head CT
yesterday.

CLINICAL DATA: 63-year-old male with initial noncontrast head CT
yesterday suggesting a large right MCA bifurcation aneurysm. Found
to be in atrial fibrillation this admission with CHF. Improving
encephalopathy, likely metabolic.

EXAM:
MRI HEAD WITHOUT CONTRAST
TECHNIQUE: Multiplanar, multiecho pulse sequences of the brain and surrounding
structures were obtained without intravenous contrast.

[Series 2: DWI · axial · 3.0mm · 0.94mm/px · z∈[-76,+71]mm · 7 of 100 slices shown (1 of 2)]
[im 1/100]
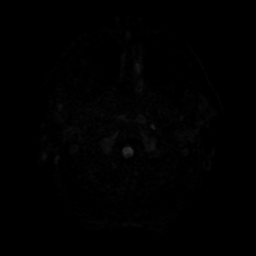
[im 17/100]
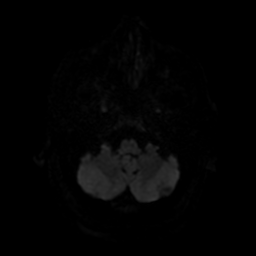
[im 34/100]
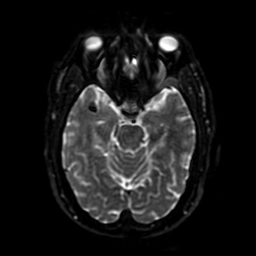
[im 50/100]
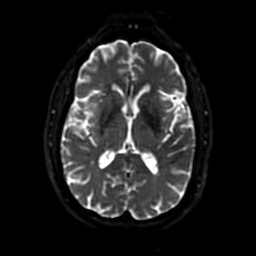
[im 67/100]
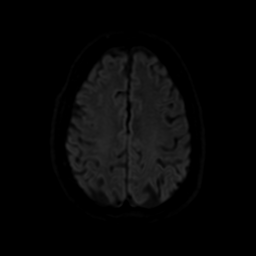
[im 83/100]
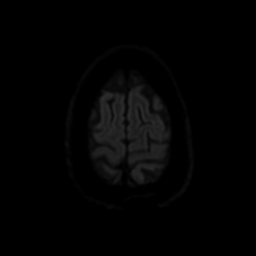
[im 100/100]
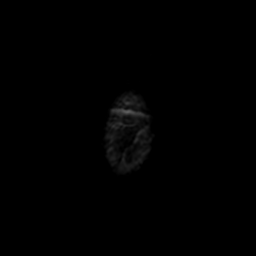

[Series 3: DWI · coronal · 4.0mm · 0.94mm/px · 6 of 76 slices shown (2 of 2)]
[im 1/76]
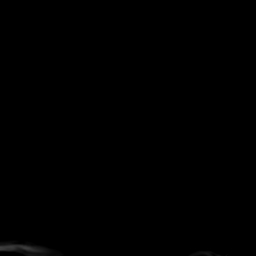
[im 16/76]
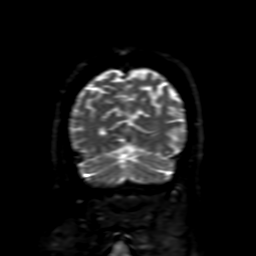
[im 31/76]
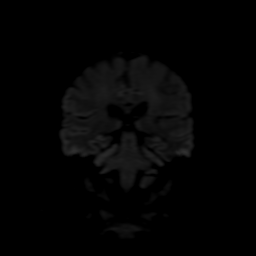
[im 46/76]
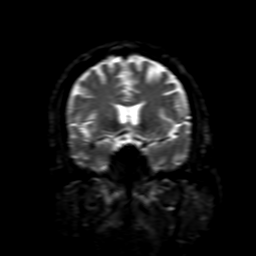
[im 61/76]
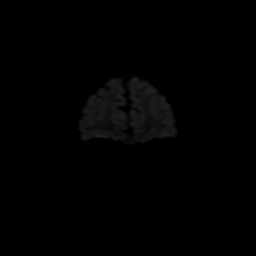
[im 76/76]
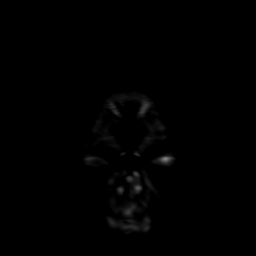

[Series 4: FLAIR · sagittal · 5.0mm · 0.23mm/px · 2 of 26 slices shown (1 of 2)]
[im 1/26]
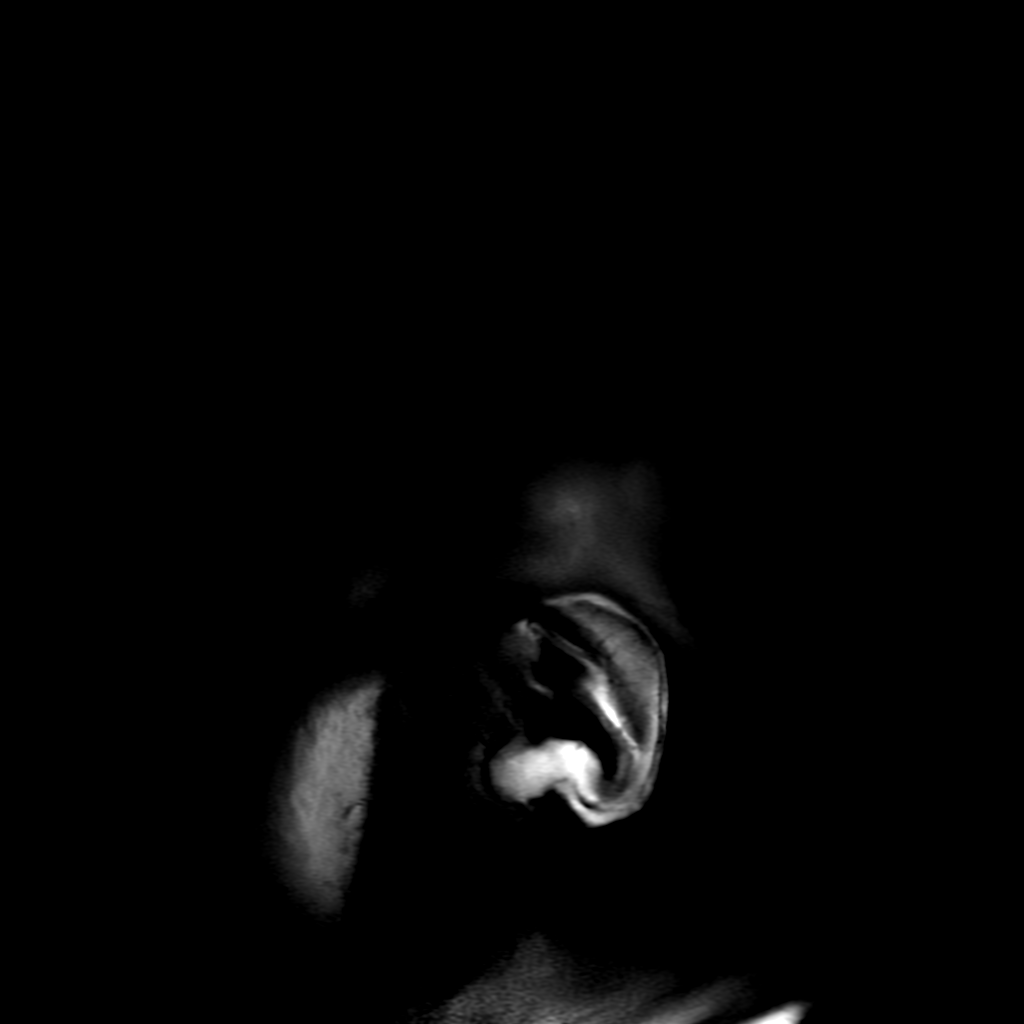
[im 26/26]
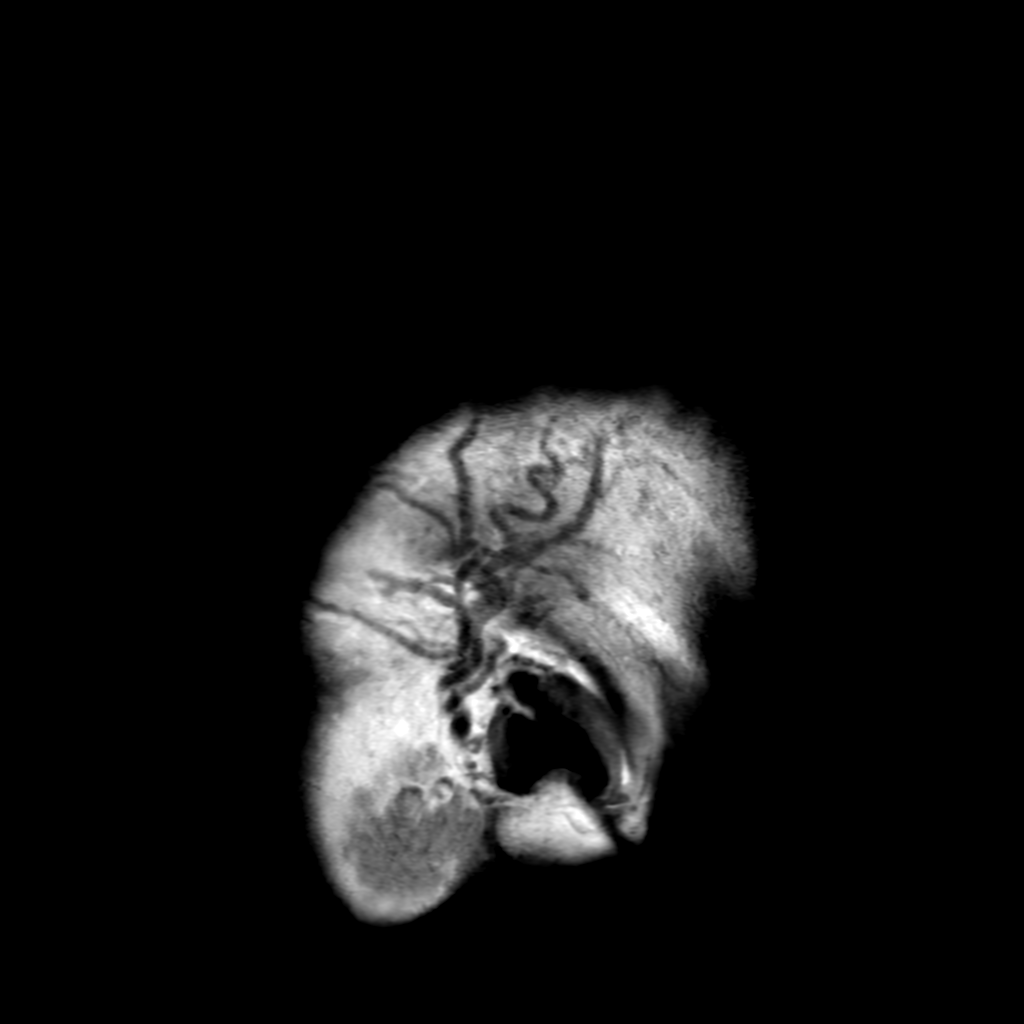

[Series 5: T2 · axial · 5.0mm · 0.23mm/px · 1 of 26 slices shown]
[im 1/26]
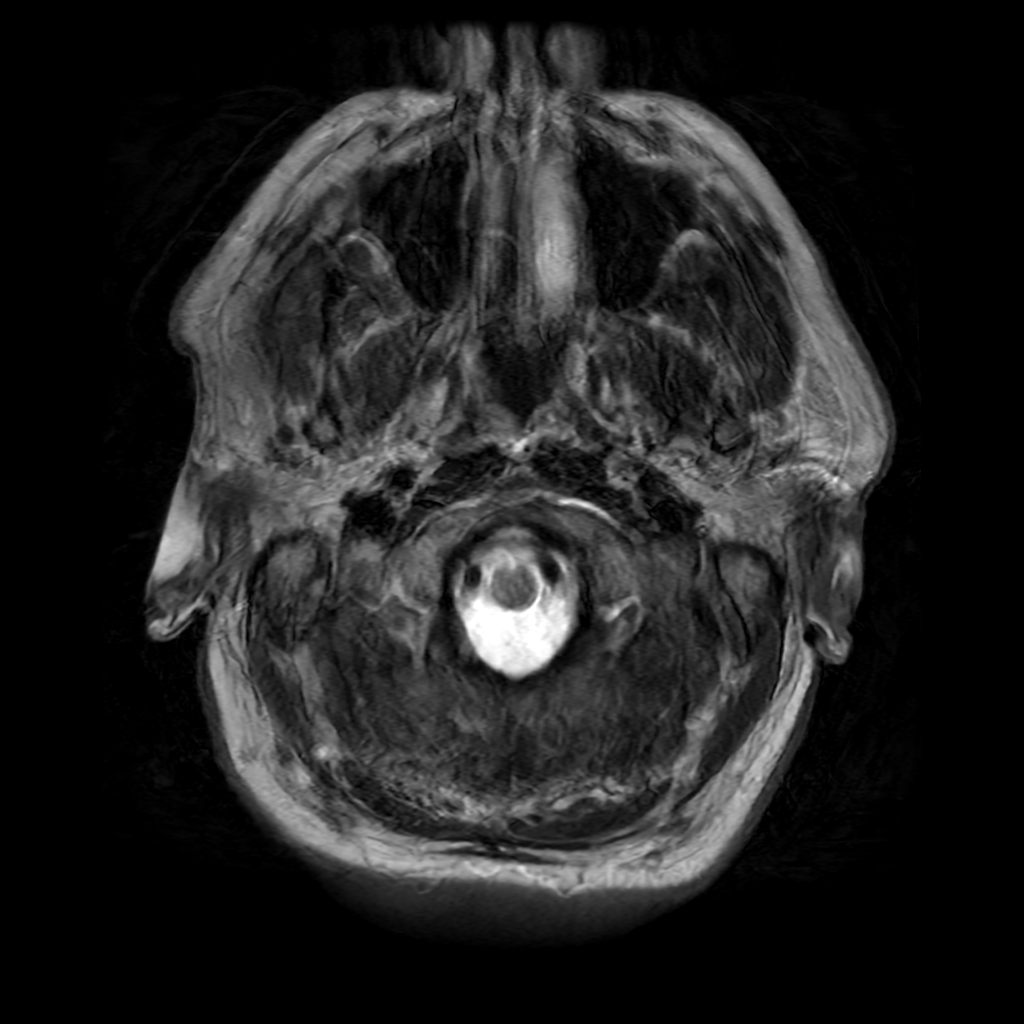

[Series 6: FLAIR · axial · 3.0mm · 0.41mm/px · z∈[-67,+77]mm · 2 of 25 slices shown (2 of 2)]
[im 1/25]
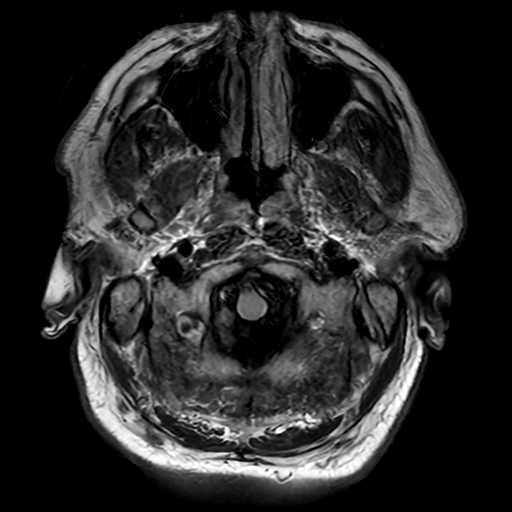
[im 25/25]
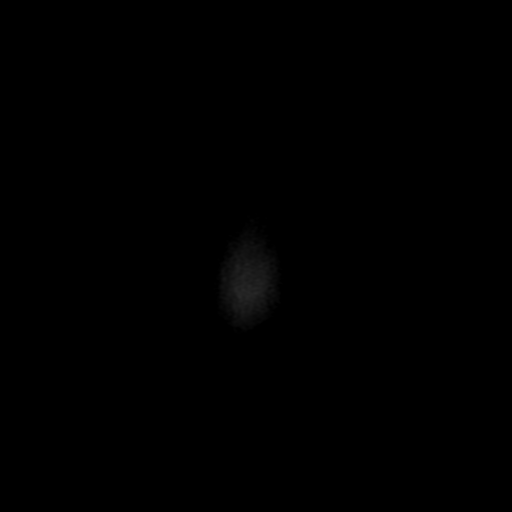

[Series 250: ADC · axial · 3.0mm · 0.94mm/px · z∈[-76,+71]mm · 4 of 50 slices shown (1 of 2)]
[im 1/50]
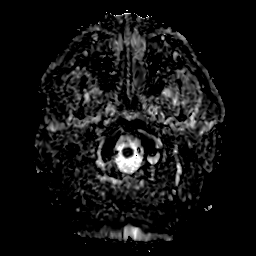
[im 17/50]
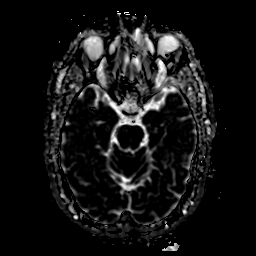
[im 33/50]
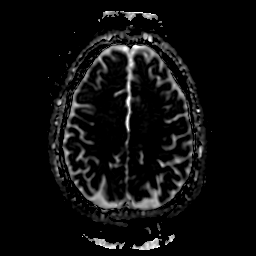
[im 50/50]
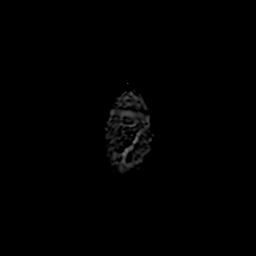

[Series 350: ADC · coronal · 4.0mm · 0.94mm/px · 3 of 38 slices shown (2 of 2)]
[im 1/38]
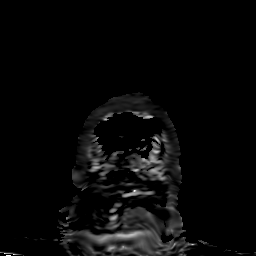
[im 19/38]
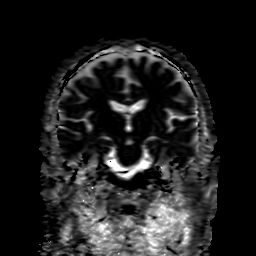
[im 38/38]
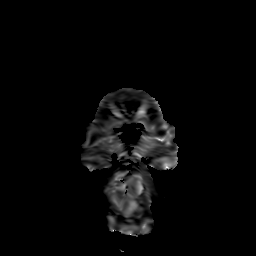

[25 of 48 positions shown; findings below may reference images not displayed]

FINDINGS: Brain: No restricted diffusion to suggest acute infarction. No
midline shift, mass effect, evidence of mass lesion,
ventriculomegaly, extra-axial collection or acute intracranial
hemorrhage. Cervicomedullary junction and pituitary are within
normal limits.

Gray and white matter signal is within normal limits for age
throughout the brain. No cortical encephalomalacia or chronic
cerebral blood products identified.

Vascular: Major intracranial vascular flow voids are preserved.
Abnormal 11 mm saccular flow void at the right MCA bifurcation, see
comparison MRA.

Skull and upper cervical spine: Normal visible cervical spine.
Visualized bone marrow signal is within normal limits.

Sinuses/Orbits: Negative.

Other: Mastoids are clear. Visible internal auditory structures
appear normal. Scalp and face soft tissues appear negative.
IMPRESSION: 1. Right MCA aneurysm, see MRA today reported separately.
2. No acute intracranial abnormality and otherwise normal for age
noncontrast MRI appearance of the brain.

## 2019-11-07 IMAGING — MR MR MRA HEAD W/O CM
1 series · 19 of 48 positions shown · non-contrast
Comparison: Noncontrast head CT yesterday.

CLINICAL DATA: 63-year-old male with initial noncontrast head CT
yesterday suggesting a large right MCA bifurcation aneurysm. Found
to be in atrial fibrillation this admission with CHF. Improving
encephalopathy, likely metabolic.

EXAM:
MRA HEAD WITHOUT CONTRAST
TECHNIQUE: Angiographic images of the Circle of Willis were obtained using MRA
technique without intravenous contrast.

[Series 3: ax (id) · axial · 1.0mm · 0.43mm/px · z∈[-79,+8]mm · 19 of 184 slices shown]
[im 1/184]
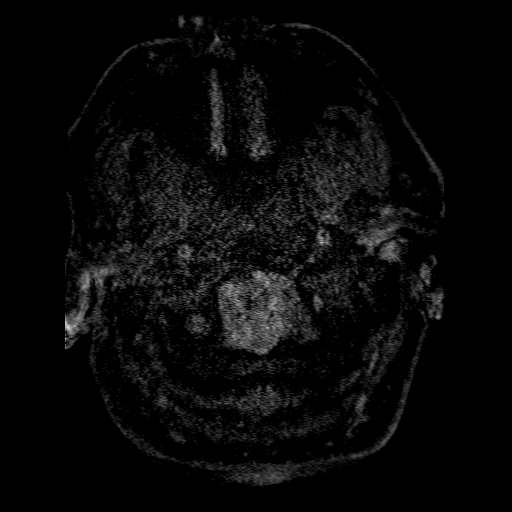
[im 4/184]
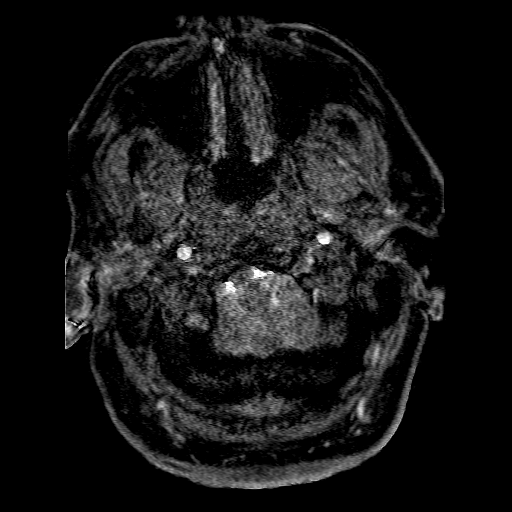
[im 8/184]
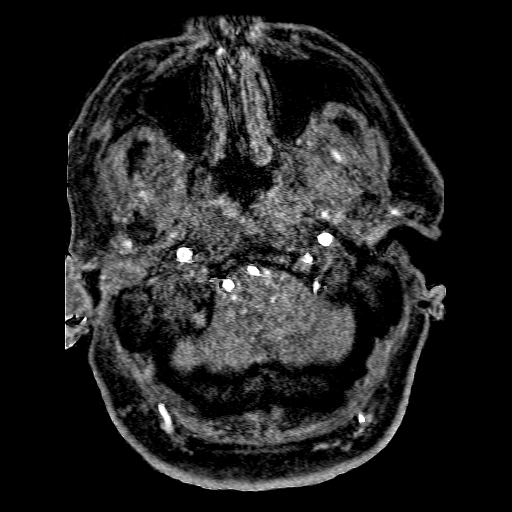
[im 12/184]
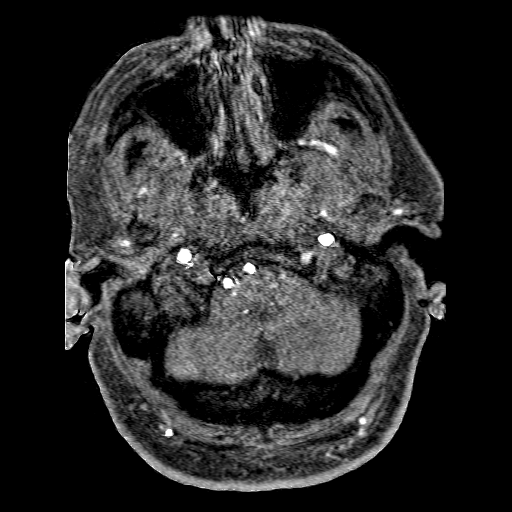
[im 16/184]
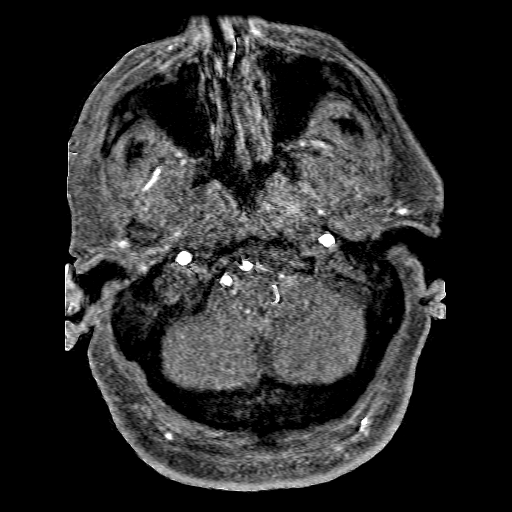
[im 20/184]
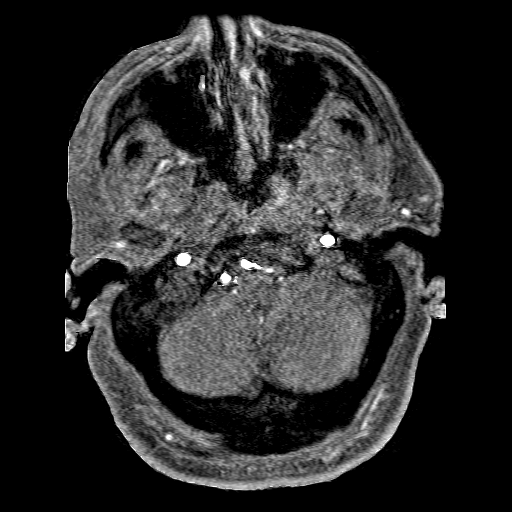
[im 24/184]
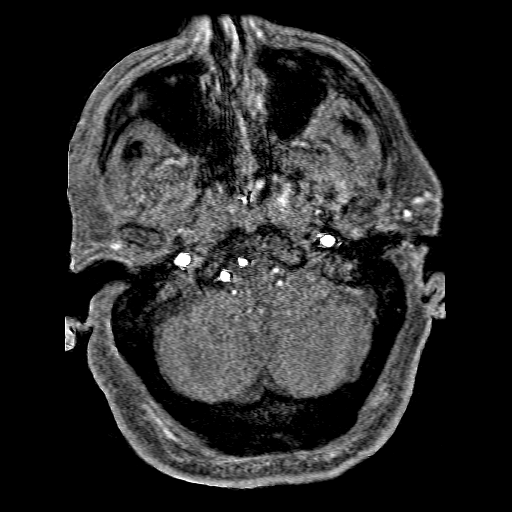
[im 28/184]
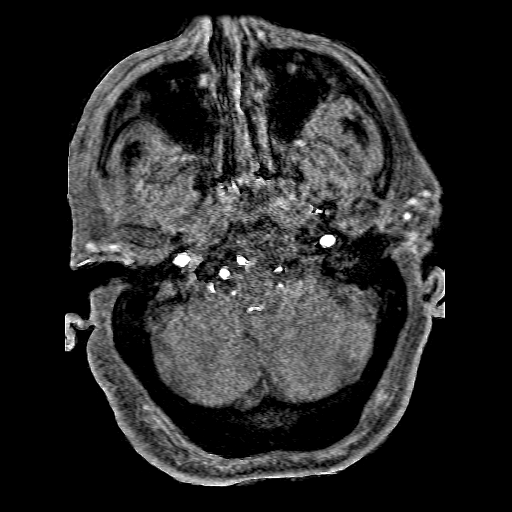
[im 32/184]
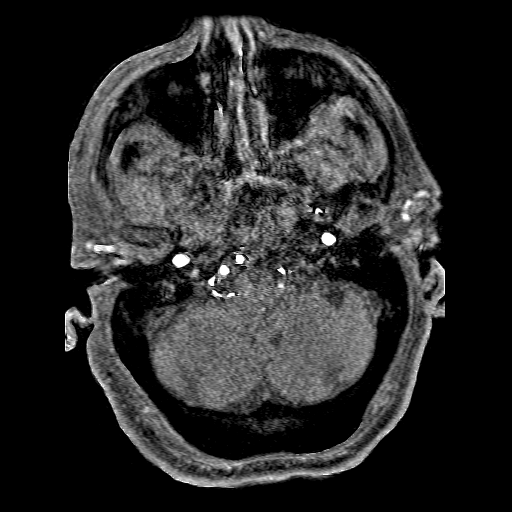
[im 36/184]
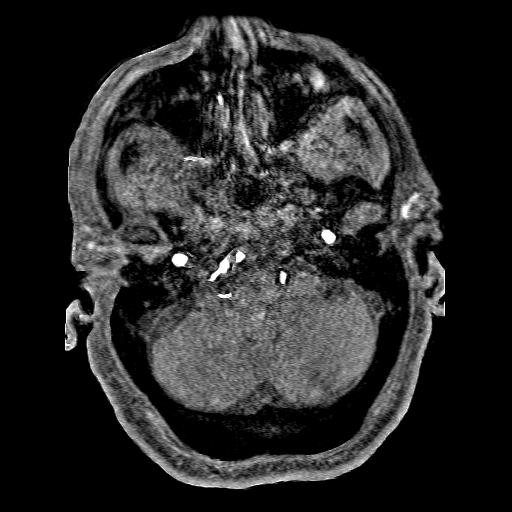
[im 39/184]
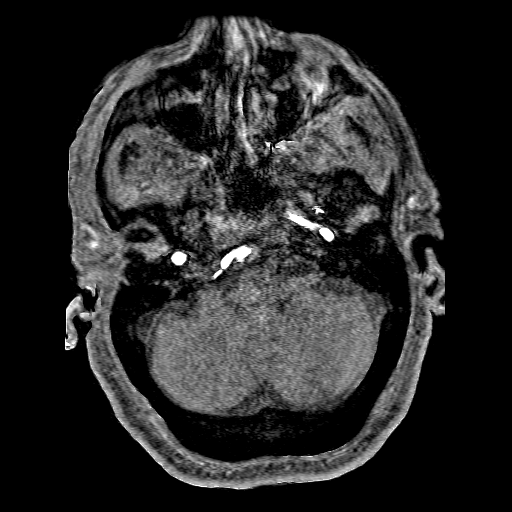
[im 59/184]
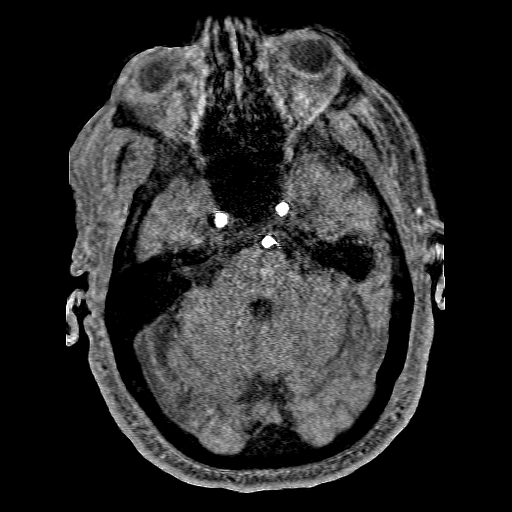
[im 82/184]
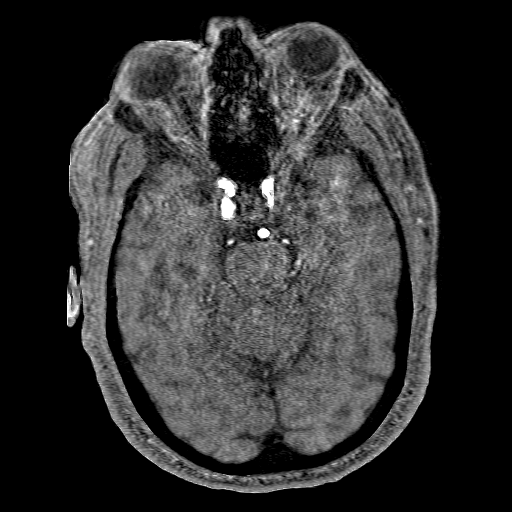
[im 94/184]
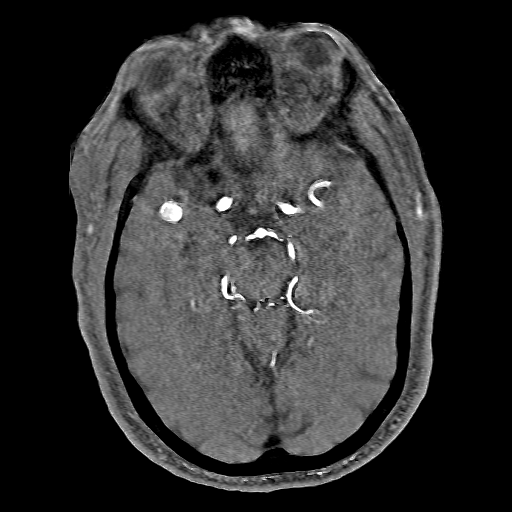
[im 106/184]
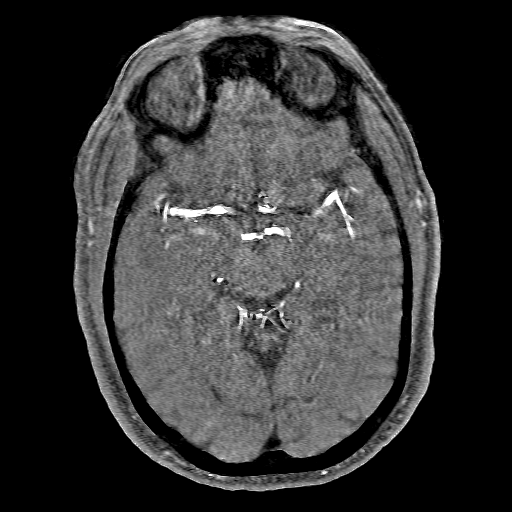
[im 129/184]
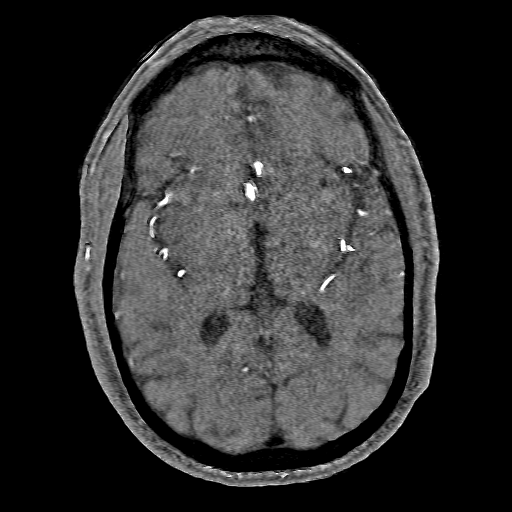
[im 152/184]
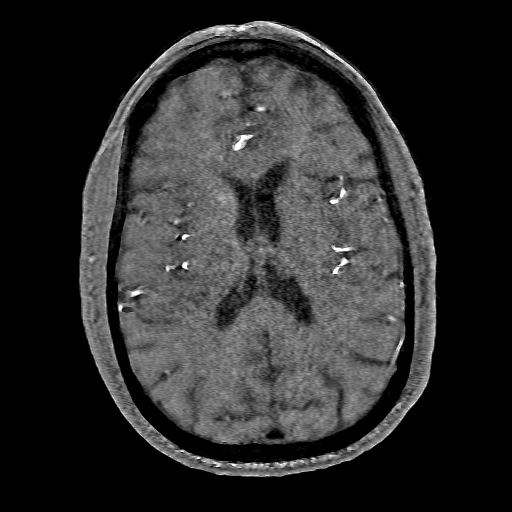
[im 156/184]
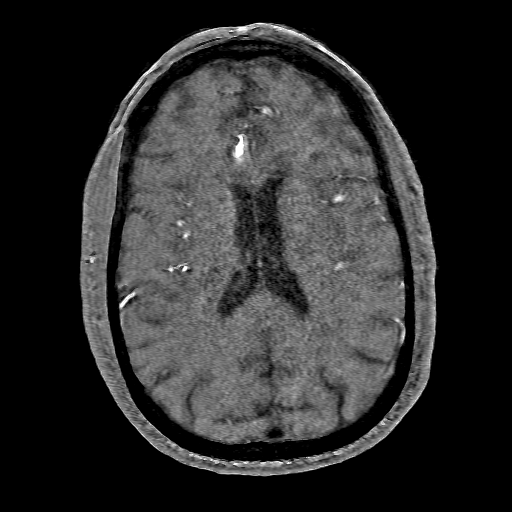
[im 176/184]
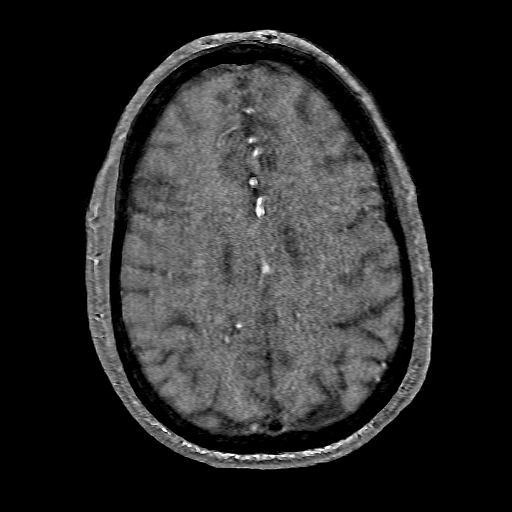

[19 of 48 positions shown; findings below may reference images not displayed]

FINDINGS: No significant intracranial mass effect.  No ventriculomegaly.

Antegrade flow in the posterior circulation with codominant distal
vertebral arteries. No distal vertebral stenosis. Patent PICA
origins and vertebrobasilar junction. Patent, mildly tortuous
basilar artery without stenosis. Patent SCA and PCA origins which
are mildly ectatic. Posterior communicating arteries are diminutive
or absent. Bilateral PCA branches are within normal limits.

Antegrade flow in the anterior circulation, both ICA siphons. Mild
to moderate siphon ectasia, no siphon stenosis. Ophthalmic artery
origins are within normal limits, the right is mildly tortuous.
Patent and mildly ectatic carotid termini, MCA and ACA origins. The
proximal ACAs are also mildly ectatic. No anterior communicating
artery aneurysm. Visible ACA branches are tortuous but otherwise
negative. Left MCA M1 segment and bifurcation are patent without
stenosis. Visible left MCA branches are within normal limits.

Right MCA M1 segment is patent without stenosis. There is an 11 mm
diameter saccular aneurysm directed inferiorly and slightly
laterally from the caudal aspect of the right MCA bifurcation. This
has a broad aneurysm neck as seen on series 352, image 9. But beyond
the bifurcation the visible right MCA branches are within normal
limits.
IMPRESSION: 1. Confirmed relatively large 11 mm Saccular Aneurysm of the Right
MCA bifurcation.
Recommend RENDON or Neurosurgery consultation to
evaluate the appropriateness of potential treatment.
2. Superimposed generalized intracranial artery ectasia, but no
arterial stenosis or branch occlusion identified.

## 2019-11-07 MED ORDER — METOPROLOL TARTRATE 12.5 MG HALF TABLET
12.5000 mg | ORAL_TABLET | Freq: Once | ORAL | Status: AC
  Administered 2019-11-07: 14:00:00 12.5 mg via ORAL
  Filled 2019-11-07: qty 1

## 2019-11-07 MED ORDER — FUROSEMIDE 10 MG/ML IJ SOLN
80.0000 mg | Freq: Three times a day (TID) | INTRAMUSCULAR | Status: DC
  Administered 2019-11-08 – 2019-11-09 (×4): 80 mg via INTRAVENOUS
  Filled 2019-11-07 (×4): qty 8

## 2019-11-07 MED ORDER — METOPROLOL TARTRATE 25 MG PO TABS
25.0000 mg | ORAL_TABLET | Freq: Two times a day (BID) | ORAL | Status: DC
  Administered 2019-11-07 – 2019-11-09 (×4): 25 mg via ORAL
  Filled 2019-11-07 (×4): qty 1

## 2019-11-07 MED ORDER — METOPROLOL TARTRATE 5 MG/5ML IV SOLN: 5.0000 mg | INTRAVENOUS | Status: DC | PRN

## 2019-11-07 MED ORDER — FUROSEMIDE 10 MG/ML IJ SOLN: 60.0000 mg | Freq: Two times a day (BID) | INTRAMUSCULAR | Status: DC

## 2019-11-07 MED ORDER — AMIODARONE HCL IN DEXTROSE 360-4.14 MG/200ML-% IV SOLN
30.0000 mg/h | INTRAVENOUS | Status: DC
  Administered 2019-11-07 – 2019-11-10 (×6): 30 mg/h via INTRAVENOUS
  Filled 2019-11-07 (×6): qty 200

## 2019-11-07 MED ORDER — METOPROLOL TARTRATE 12.5 MG HALF TABLET
12.5000 mg | ORAL_TABLET | Freq: Two times a day (BID) | ORAL | Status: DC
  Administered 2019-11-07: 12:00:00 12.5 mg via ORAL
  Filled 2019-11-07: qty 1

## 2019-11-07 MED ORDER — FUROSEMIDE 10 MG/ML IJ SOLN
60.0000 mg | Freq: Two times a day (BID) | INTRAMUSCULAR | Status: DC
  Administered 2019-11-07 (×2): 60 mg via INTRAVENOUS
  Filled 2019-11-07 (×2): qty 6

## 2019-11-07 MED ORDER — AMIODARONE HCL IN DEXTROSE 360-4.14 MG/200ML-% IV SOLN
60.0000 mg/h | INTRAVENOUS | Status: AC
  Administered 2019-11-07: 13:00:00 60 mg/h via INTRAVENOUS
  Filled 2019-11-07: qty 200

## 2019-11-07 NOTE — Progress Notes (Signed)
NIR consulted by Dr. Nelson Chimes for management of incidental finding of right MCA bifurcation aneurysm, seen on CT head 11/06/2019.  Went to see patient in room alongside Dr. Corliss Skains. Patient awake and alert laying in bed talking on the phone with his wife who was present (via telephone) during today's interaction. No complaints.  Explained CT head findings and how this aneurysm is an incidental finding. Explained that MRI/MRA brain/head are being ordered to further evaluate aneurysm. Explained that following this, the best course of management for his aneurysm is with an image-guided diagnostic cerebral arteriogram. Explained procedure, including risks and benefits. Explained this procedure will accurately evaluate his aneurysm and help plan treatment for his aneurysm. Explained that treatment would be on a different day than the diagnostic cerebral arteriogram. Per TRH, patient pending cardiology work-up for new onset HF- will hold off on scheduling procedure until patient is cleared by cardiology.  Plan for image-guided diagnostic cerebral arteriogram on an inpatient or outpatient basis, pending cardiology work-up for HF. Will consent patient for procedure/place appropriate orders once cardiology work-up complete.  NIR to follow, please call NIR with questions/concerns.   Waylan Boga Lynnann Knudsen, PA-C 11/07/2019, 10:12 AM

## 2019-11-07 NOTE — Progress Notes (Signed)
ANTICOAGULATION CONSULT NOTE - Follow Up Consult  Pharmacy Consult for heparin Indication: atrial fibrillation  No Known Allergies  Patient Measurements: Height: 6\' 4"  (193 cm) Weight: 261 lb 0.4 oz (118.4 kg)(per nurse) IBW/kg (Calculated) : 86.8 Heparin Dosing Weight: 103.5  Vital Signs: Temp: 98.2 F (36.8 C) (03/24 0811) Temp Source: Axillary (03/24 0811) BP: 102/71 (03/24 0855) Pulse Rate: 114 (03/24 0855)  Labs: Recent Labs    11/05/19 1616 11/05/19 1731 11/05/19 1822 11/05/19 1822 11/05/19 2053 11/06/19 0312 11/06/19 0701 11/06/19 1623 11/06/19 1849 11/07/19 0006  HGB   < >  --  15.2   < >  --  14.5  --   --   --  14.9  HCT   < >  --  47.0  --   --  45.5  --   --   --  47.4  PLT   < >  --  162  --   --  175  --   --   --  182  HEPARINUNFRC  --   --   --   --   --   --  0.74* 0.38  --  0.35  CREATININE   < >  --  0.76   < > 0.79 0.90  --   --  0.86 0.86  TROPONINIHS  --  11 14  --  12  --   --   --   --   --    < > = values in this interval not displayed.    Estimated Creatinine Clearance: 126.8 mL/min (by C-G formula based on SCr of 0.86 mg/dL).   Medical History: Past Medical History:  Diagnosis Date  . Gout     Medications:  Scheduled:   Infusions:  . heparin 1,450 Units/hr (11/07/19 0902)    Assessment: Richard Graham is a 64 y/o M presenting with new-onset atrial fibrillation with CHF. He is not on any anticoagulation PTA. His CHADS2VASC score is 1 (new CHF).  IV heparin was held overnight with large R MCA bifurcation aneurysm (no evidence to suggest rupture). Per discussion with Dr. 77 this morning, he would like to resume heparin at this time and defer further recommendations to Cardiology and Neuro IR. H/H and Plt wnl. No active bleed issues reported.  Goal of Therapy:  Heparin level 0.3-0.7 units/ml Monitor platelets by anticoagulation protocol: Yes   Plan:  Resume IV heparin at previous rate 1450 units/hr per discussion with Dr.  Nelson Chimes Check 6hr heparin level Monitor daily heparin level and CBC, s/sx bleeding F/u Cardiology/Neuro IR plans   Nelson Chimes, PharmD, BCPS Please check AMION for all [facility] contact numbers Clinical Pharmacist 11/07/2019 9:40 AM

## 2019-11-07 NOTE — Progress Notes (Signed)
ANTICOAGULATION CONSULT NOTE - Follow Up Consult  Pharmacy Consult for heparin Indication: atrial fibrillation  No Known Allergies  Patient Measurements: Height: 6\' 4"  (193 cm) Weight: 261 lb 0.4 oz (118.4 kg)(per nurse) IBW/kg (Calculated) : 86.8 Heparin Dosing Weight: 103.5  Vital Signs: Temp: 98.3 F (36.8 C) (03/24 1655) Temp Source: Oral (03/24 1655) BP: 112/74 (03/24 1500) Pulse Rate: 77 (03/24 1500)  Labs: Recent Labs    11/05/19 1616 11/05/19 1731 11/05/19 1822 11/05/19 1822 11/05/19 2053 11/06/19 0312 11/06/19 0701 11/06/19 1623 11/06/19 1849 11/07/19 0006 11/07/19 1648  HGB   < >  --  15.2   < >  --  14.5  --   --   --  14.9  --   HCT   < >  --  47.0  --   --  45.5  --   --   --  47.4  --   PLT   < >  --  162  --   --  175  --   --   --  182  --   HEPARINUNFRC  --   --   --   --   --   --    < > 0.38  --  0.35 0.27*  CREATININE   < >  --  0.76   < > 0.79 0.90  --   --  0.86 0.86  --   TROPONINIHS  --  11 14  --  12  --   --   --   --   --   --    < > = values in this interval not displayed.    Estimated Creatinine Clearance: 126.8 mL/min (by C-G formula based on SCr of 0.86 mg/dL).   Medical History: Past Medical History:  Diagnosis Date  . Gout     Medications:  Scheduled:  . furosemide  60 mg Intravenous BID  . metoprolol tartrate  25 mg Oral BID   Infusions:  . amiodarone 60 mg/hr (11/07/19 1306)  . amiodarone    . heparin 1,450 Units/hr (11/07/19 1648)    Assessment: Richard Graham is a 64 y/o M presenting with new-onset atrial fibrillation with CHF. He is not on any anticoagulation PTA. His CHADS2VASC score is 1 (new CHF).  IV heparin was held overnight with large R MCA bifurcation aneurysm (no evidence to suggest rupture). Per discussion with Dr. 77 this morning, he would like to resume heparin at this time and defer further recommendations to Cardiology and Neuro IR. H/H and Plt wnl. No active bleed issues reported.  Follow up  heparin level this afternoon is slightly below goal at 0.27 on 1450 units/hr. No bleeding issues noted.   Goal of Therapy:  Heparin level 0.3-0.5 units/ml Monitor platelets by anticoagulation protocol: Yes   Plan:  Increase IV heparin to 1550 units/hr  Recheck in am Monitor daily heparin level and CBC, s/sx bleeding F/u Cardiology/Neuro IR plans   Nelson Chimes PharmD., BCPS Clinical Pharmacist 11/07/2019 5:44 PM

## 2019-11-07 NOTE — Progress Notes (Signed)
Nutrition Brief Note  Patient identified on the [place] Report  Wt Readings from Last 15 Encounters:  11/07/19 118.4 kg  06/27/15 104.4 kg  06/23/15 104.4 kg  04/28/14 129.3 kg   Richard Graham is a 64 y.o. male with medical history significant of prediabetes, obesity, gout presents to emergency department due to worsening shortness of breath and leg swelling since 3 weeks.  Pt admitted with acute CHF.   Reviewed I/O's: -6.2 L x 24 hours  UOP: 6.8 L x 24 hours  Pt pleasant, but extremely tangential at time of visit. It was extremely difficult for him to stay on topic during interview, despite being re-directed multiple times. He shares that he is very hungry and awaiting breakfast. He is unsure if he has lost weight. Observed lower extremity edema, which pt reports has improved since admission (-6.2 L x 24 hours).   Nutrition-Focused physical exam completed. Findings are no fat depletion, no muscle depletion, and moderate edema.   Lab Results  Component Value Date   HGBA1C 6.0 (H) 11/05/2019   PTA DM medications are none.   Labs reviewed: Na: 133  Body mass index is 31.77 kg/m. Patient meets criteria for obesity, class I based on current BMI.   Current diet order is 2 gram sodium, patient is consuming approximately n/a% of meals at this time. Labs and medications reviewed.   No nutrition interventions warranted at this time. If nutrition issues arise, please consult RD.   Levada Schilling, RD, LDN, CDCES Registered Dietitian II Certified Diabetes Care and Education Specialist Please refer to [facility] for RD and/or RD on-call/weekend/after hours pager

## 2019-11-07 NOTE — Progress Notes (Signed)
PROGRESS NOTE    Richard Graham  ION:[id number] DOB: [birth year] DOA: 11/05/2019 PCP: Richard Frees, MD   Brief Narrative:  64 year old with history of prediabetes, obesity, gout admitted for worsening shortness of breath and lower extremity swelling.  Found to be in atrial fibrillation, new diagnosis.  Chest x-ray showed pulmonary edema.  Cardiology team was consulted, started on diuretics, heparin drip and Cardizem drip.  Suspected his hyponatremia was secondary to fluid overload.   Assessment & Plan:   Principal Problem:   Acute CHF (congestive heart failure) (HCC) Active Problems:   Gout   Thrombocytopenia (HCC)   New onset a-fib (HCC)   Morbid obesity (HCC)  Acute congestive heart failure, reduced ejection fraction 20-25%. Ischemic cardiomyopathy -Echocardiogram showing EF 20-25%, akinetic apex.  2 g salt diet low fluid restriction -She will require ischemic evaluation likely have left heart catheterization.  Will defer this to cardiology team -Continue heparin drip for now -A1c 6.0, LDL ordered  New onset atrial fibrillation -Continue heparin drip.  Will defer to cardiology for long-term management -IV Lopressor as needed.  Metoprolol 12.5 mg p.o. twice daily-uptitrate as necessary  Acute metabolic encephalopathy, improved CO2 narcosis; improved.  Right MCA bifurcation aneurysm, 10 mm -CT of the head is negative for any acute bleed but coincidental finding of large right MCA bifurcation aneurysm, 10 mm. -Dicussed with Dr Richard Graham, he will see the patient.  Patient will require angiogram. -MRI brain and MRA head has been ordered. -Mentation improved after being on BiPAP, this morning awake alert therefore discontinue BiPAP for now.  Thrombocytopenia -Platelets stable at 182 without evidence of bleeding.  Body mass index is 31.77 kg/m.   DVT prophylaxis: Heparin drip Code Status: Full code Family Communication:  None Disposition Plan:   Patient From=  Home  Patient Anticipated D/C place= Home  Barriers= maintain hospital stay for ischemic evaluation from cardiac standpoint.  Thereafter he will require cerebral angiogram for MCA bifurcation aneurysm    Subjective: Patient is awake alert this morning.  Yesterday evening he was quite drowsy and somnolent concerns for CO2 narcosis had to be placed on BiPAP.  Doing better this morning.  Denies any chest pain to me or concerns at this over last several months to years.  Occasionally at home he is reported some dizziness but no other complaints.  Does not follow-up with cardiologist.  Review of Systems Otherwise negative except as per HPI, including: General: Denies fever, chills, night sweats or unintended weight loss. Resp: Denies cough, wheezing, shortness of breath. Cardiac: Denies chest pain, palpitations, orthopnea, paroxysmal nocturnal dyspnea. GI: Denies abdominal pain, nausea, vomiting, diarrhea or constipation GU: Denies dysuria, frequency, hesitancy or incontinence MS: Denies muscle aches, joint pain or swelling Neuro: Denies headache, neurologic deficits (focal weakness, numbness, tingling), abnormal gait Psych: Denies anxiety, depression, SI/HI/AVH Skin: Denies new rashes or lesions ID: Denies sick contacts, exotic exposures, travel  Examination:  General exam: Appears calm and comfortable, 2L nasal cannula Respiratory system: Bibasilar rhonchi Cardiovascular system: S1 & S2 heard, RRR. No JVD, murmurs, rubs, gallops or clicks.  3+ bilateral lower extremity pitting edema Gastrointestinal system: Abdomen is nondistended, soft and nontender. No organomegaly or masses felt. Normal bowel sounds heard. Central nervous system: Alert and oriented. No focal neurological deficits. Extremities: Symmetric 5 x 5 power. Skin: No rashes, lesions or ulcers Psychiatry: Judgement and insight appear normal. Mood & affect appropriate.     Objective: Vitals:   11/07/19 0515 11/07/19 0535  11/07/19 0730 11/07/19 0811  BP: 102/67  106/74    Pulse: 88 (!) 102 (!) 109   Resp:  16 (!) 32   Temp:  97.9 F (36.6 C)  98.2 F (36.8 C)  TempSrc:    Axillary  SpO2: 98% 100% 99%   Weight: 118.4 kg     Height:        Intake/Output Summary (Last 24 hours) at 11/07/2019 0824 Last data filed at 11/07/2019 0600 Gross per 24 hour  Intake 517.08 ml  Output 6750 ml  Net -6232.92 ml   Filed Weights   11/05/19 1515 11/06/19 1620 11/07/19 0515  Weight: 112 kg 123.4 kg 118.4 kg     Data Reviewed:   CBC: Recent Labs  Lab 11/05/19 1616 11/05/19 1822 11/06/19 0312 11/07/19 0006  WBC 8.6 8.6 8.3 8.8  HGB 15.3 15.2 14.5 14.9  HCT 47.0 47.0 45.5 47.4  MCV 93.1 93.3 95.2 96.5  PLT 104* 162 175 182   Basic Metabolic Panel: Recent Labs  Lab 11/05/19 1721 11/05/19 1721 11/05/19 1822 11/05/19 2053 11/06/19 0312 11/06/19 1849 11/07/19 0006  NA 122*  --   --  124* 125* 126* 133*  K 5.0  --   --  4.6 4.2 3.7 3.8  CL 81*  --   --  87* 82* 77* 82*  CO2 28  --   --  27 33* 36* 42*  GLUCOSE 121*  --   --  110* 134* 129* 123*  BUN 17  --   --  16 16 13 13   CREATININE 0.77   < > 0.76 0.79 0.90 0.86 0.86  CALCIUM 8.7*  --   --  7.9* 8.3* 8.7* 8.6*  MG  --   --  2.1  --   --   --   --   PHOS  --   --  4.0  --   --   --   --    < > = values in this interval not displayed.   GFR: Estimated Creatinine Clearance: 126.8 mL/min (by C-G formula based on SCr of 0.86 mg/dL). Liver Function Tests: Recent Labs  Lab 11/05/19 1721 11/05/19 2053 11/06/19 0312  AST 30 34 21  ALT 23 19 21   ALKPHOS 63 58 60  BILITOT 1.5* 1.6* 1.3*  PROT 6.0* 5.6* 5.6*  ALBUMIN 3.8 3.5 3.5   No results for input(s): LIPASE, AMYLASE in the last 168 hours. No results for input(s): AMMONIA in the last 168 hours. Coagulation Profile: No results for input(s): INR, PROTIME in the last 168 hours. Cardiac Enzymes: No results for input(s): CKTOTAL, CKMB, CKMBINDEX, TROPONINI in the last 168 hours. BNP (last 3  results) No results for input(s): PROBNP in the last 8760 hours. HbA1C: Recent Labs    11/05/19 1824  HGBA1C 6.0*   CBG: Recent Labs  Lab 11/06/19 1737  GLUCAP 127*   Lipid Profile: Recent Labs    11/05/19 1822  CHOL 102  HDL 32*  LDLCALC 61  TRIG 43  CHOLHDL 3.2   Thyroid Function Tests: Recent Labs    11/05/19 1616  TSH 1.067  FREET4 1.20*   Anemia Panel: No results for input(s): VITAMINB12, FOLATE, FERRITIN, TIBC, IRON, RETICCTPCT in the last 72 hours. Sepsis Labs: No results for input(s): PROCALCITON, LATICACIDVEN in the last 168 hours.  Recent Results (from the past 240 hour(s))  SARS CORONAVIRUS 2 (TAT 6-24 HRS) Nasopharyngeal Nasopharyngeal Swab     Status: None   Collection Time: 11/05/19  6:56 PM   Specimen: Nasopharyngeal Swab  Result Value Ref Range Status   SARS Coronavirus 2 NEGATIVE NEGATIVE Final    Comment: (NOTE) SARS-CoV-2 target nucleic acids are NOT DETECTED. The SARS-CoV-2 RNA is generally detectable in upper and lower respiratory specimens during the acute phase of infection. Negative results do not preclude SARS-CoV-2 infection, do not rule out co-infections with other pathogens, and should not be used as the sole basis for treatment or other patient management decisions. Negative results must be combined with clinical observations, patient history, and epidemiological information. The expected result is Negative. Fact Sheet for Patients: HairSlick.no Fact Sheet for Healthcare Providers: quierodirigir.com This test is not yet approved or cleared by the [place] FDA and  has been authorized for detection and/or diagnosis of SARS-CoV-2 by FDA under an Emergency Use Authorization (EUA). This EUA will remain  in effect (meaning this test can be used) for the duration of the COVID-19 declaration under Section 56 4(b)(1) of the Act, 21 U.S.C. section 360bbb-3(b)(1), unless the  authorization is terminated or revoked sooner. Performed at [facility], [address] [place], [place] [address]          Radiology Studies: CT HEAD WO CONTRAST  Addendum Date: 11/06/2019   ADDENDUM REPORT: 11/06/2019 20:21 ADDENDUM: Study discussed by telephone with Provider M. Denny on 11/06/2019 at 2004 hours. Electronically Signed   By: Odessa Fleming M.D.   On: 11/06/2019 20:21   Result Date: 11/06/2019 CLINICAL DATA:  64 year old male with acute neurologic deficit. EXAM: CT HEAD WITHOUT CONTRAST TECHNIQUE: Contiguous axial images were obtained from the base of the skull through the vertex without intravenous contrast. COMPARISON:  None. FINDINGS: Brain: Abnormal rounded area of hypodensity in the anterior right temporal lobe, although seems to be inseparable from the right MCA vessels in that region (coronal image 28). No associated temporal lobe edema. No significant mass effect. Cerebral volume is within normal limits for age. Normal gray-white matter differentiation elsewhere in the brain. No ventriculomegaly, midline shift or intracranial mass effect. No convincing acute intracranial hemorrhage. No cortically based acute infarct identified. Vascular: Mild Calcified atherosclerosis at the skull base. Suspected large, approximately 10 mm saccular aneurysm of the right MCA bifurcation corresponding to the right temporal lobe hyperdensity described above. No other No suspicious intracranial vascular hyperdensity. Skull: Negative. Sinuses/Orbits: Visualized paranasal sinuses and mastoids are clear. Other: Visualized orbits and scalp soft tissues are within normal limits. IMPRESSION: 1. Plain CT appearance suspicious for a Large Right MCA bifurcation aneurysm, estimated at 10 mm. No acute identified to suggest aneurysm rupture. And otherwise negative noncontrast CT appearance of the brain. 2. Considering symptoms of acute neurologic deficit, follow-up noncontrast Head MRI and Head MRA may be  the best next imaging step. Electronically Signed: By: Odessa Fleming M.D. On: 11/06/2019 19:23   DG Chest Portable 1 View  Result Date: 11/05/2019 CLINICAL DATA:  64 year old male with increasing shortness of breath x2 weeks. Abdominal and lower extremity edema. Wheezing. Found to be in atrial fibrillation with RVR. EXAM: PORTABLE CHEST 1 VIEW COMPARISON:  CT Abdomen and Pelvis 04/08/2015. FINDINGS: Portable AP semi upright view at 1539 hours. Possible new cardiomegaly since 2016. Other mediastinal contours are within normal limits. Low lung volumes with combined patchy and veiling opacity at the right lung base. Generalized pulmonary vascular congestion. No pneumothorax. No left lung base opacity. No acute osseous abnormality identified. IMPRESSION: 1. Low lung volumes with suspected pulmonary interstitial edema with right lung base atelectasis and/or small pleural effusion. 2. Possible cardiomegaly, which would  be new since 2016. Electronically Signed   By: Odessa Fleming M.D.   On: 11/05/2019 15:54   ECHOCARDIOGRAM COMPLETE  Result Date: 11/06/2019    ECHOCARDIOGRAM REPORT   Patient Name:   Richard Graham Date of Exam: 11/06/2019 Medical Rec #:  [id number]         Height:       73.0 in Accession #:    [id number]        Weight:       247.0 lb Date of Birth:  15-Jun-1956         BSA:          2.354 m Patient Age:    63 years          BP:           113/90 mmHg Patient Gender: M                 HR:           107 bpm. Exam Location:  Inpatient Procedure: 2D Echo Indications:    congestive heart failure 428.0  History:        Patient has no prior history of Echocardiogram examinations.                 Arrythmias:Atrial Fibrillation; Signs/Symptoms:Shortness of                 Breath and lower extremity edema.  Sonographer:    Delcie Roch Referring Phys: [id number] RINKA R PAHWANI IMPRESSIONS  1. LV function is severely reduced, 20-25%. The mid anteroseptum into apex is akinetic, almost aneurysmal, consistent with prior  LAD infarction (Q waves on EKG). The myocardium is thinned, futher supporting prior infarction. On contrast imaging, there is no LV thrombus seen.  2. Left ventricular ejection fraction, by estimation, is 20 to 25%. The left ventricle has severely decreased function. The left ventricle demonstrates regional wall motion abnormalities (see scoring diagram/findings for description). There is mild concentric left ventricular hypertrophy. Left ventricular diastolic function could not be evaluated.  3. Right ventricular systolic function is mildly reduced. The right ventricular size is normal. There is mildly elevated pulmonary artery systolic pressure. The estimated right ventricular systolic pressure is 42.2 mmHg.  4. Left atrial size was mildly dilated.  5. The mitral valve is grossly normal. Trivial mitral valve regurgitation. No evidence of mitral stenosis.  6. The aortic valve is tricuspid. Aortic valve regurgitation is not visualized. No aortic stenosis is present. Conclusion(s)/Recommendation(s): Findings consistent with ischemic cardiomyopathy. FINDINGS  Left Ventricle: Left ventricular ejection fraction, by estimation, is 20 to 25%. The left ventricle has severely decreased function. The left ventricle demonstrates regional wall motion abnormalities. Definity contrast agent was given IV to delineate the left ventricular endocardial borders. The left ventricular internal cavity size was normal in size. There is mild concentric left ventricular hypertrophy. Left ventricular diastolic function could not be evaluated due to atrial fibrillation. Left ventricular diastolic function could not be evaluated.  LV Wall Scoring: The mid and distal anterior septum, apical anterior segment, and apex are akinetic. Right Ventricle: The right ventricular size is normal. No increase in right ventricular wall thickness. Right ventricular systolic function is mildly reduced. There is mildly elevated pulmonary artery systolic  pressure. The tricuspid regurgitant velocity  is 2.61 m/s, and with an assumed right atrial pressure of 15 mmHg, the estimated right ventricular systolic pressure is 42.2 mmHg. Left Atrium: Left atrial size was mildly dilated. Right Atrium: Right atrial size  was normal in size. Pericardium: There is no evidence of pericardial effusion. Mitral Valve: The mitral valve is grossly normal. Trivial mitral valve regurgitation. No evidence of mitral valve stenosis. Tricuspid Valve: The tricuspid valve is grossly normal. Tricuspid valve regurgitation is mild . No evidence of tricuspid stenosis. Aortic Valve: The aortic valve is tricuspid. Aortic valve regurgitation is not visualized. No aortic stenosis is present. Pulmonic Valve: The pulmonic valve was grossly normal. Pulmonic valve regurgitation is not visualized. No evidence of pulmonic stenosis. Aorta: The aortic root is normal in size and structure. IAS/Shunts: The interatrial septum appears to be lipomatous. No atrial level shunt detected by color flow Doppler. Additional Comments: LV function is severely reduced, 20-25%. The mid anteroseptum into apex is akinetic, almost aneurysmal, consistent with prior LAD infarction (Q waves on EKG). The myocardium is thinned, futher supporting prior infarction. On contrast  imaging, there is no LV thrombus seen.  LEFT VENTRICLE PLAX 2D LVIDd:         5.80 cm LVIDs:         4.60 cm LV PW:         1.20 cm LV IVS:        1.20 cm LVOT diam:     2.30 cm LV SV:         61 LV SV Index:   26 LVOT Area:     4.15 cm  RIGHT VENTRICLE RV S prime:     11.60 cm/s TAPSE (M-mode): 1.4 cm LEFT ATRIUM           Index       RIGHT ATRIUM           Index LA diam:      5.30 cm 2.25 cm/m  RA Area:     17.10 cm LA Vol (A2C): 62.1 ml 26.39 ml/m RA Volume:   46.60 ml  19.80 ml/m LA Vol (A4C): 82.4 ml 35.01 ml/m  AORTIC VALVE LVOT Vmax:   86.20 cm/s LVOT Vmean:  57.800 cm/s LVOT VTI:    0.146 m  AORTA Ao Root diam: 3.40 cm TRICUSPID VALVE TR Peak grad:    27.2 mmHg TR Vmax:        261.00 cm/s  SHUNTS Systemic VTI:  0.15 m Systemic Diam: 2.30 cm Lennie Odor MD Electronically signed by Lennie Odor MD Signature Date/Time: 11/06/2019/4:33:04 PM    Final         Scheduled Meds: Continuous Infusions: . heparin Stopped (11/06/19 2320)     LOS: 2 days   Time spent= 45 mins    Jimmie Rueter Joline Maxcy, MD [facility]  If 7PM-7AM, please contact night-coverage  11/07/2019, 8:24 AM

## 2019-11-07 NOTE — Progress Notes (Addendum)
Repeat ABG specifically SPO2 remains 81.3. low BP dropped to 92/67 HR stayed 100s-120s. Woke pt, was now alert x1 able to say his name  Clearer but unable to answer some questions. He is able to move both arms up, but unable to follow command properly. He went back to sleep after.  Spoke TO provider Katherina Right, she order to stop the heparin for now d/t aneurysm, Cardizem was stopped as well d/t low BP. Digoxin given as per mar. Pt still on Bipap. Will monitor   awaiting MRI head as ordered.

## 2019-11-07 NOTE — Progress Notes (Signed)
Attempted to call MRI [phone]. Nobody  Answered the call. Will follow up.

## 2019-11-07 NOTE — Progress Notes (Signed)
Pt is more alert and orientedx4, very adamant to take Bipap off.MD paged and okeyed to remove Bipap. RT made aware. Placed of O2nc @ 6L, O2 Sats 99%. Pt  Singing and talking. Pt verbalized wanting to eat and drink.

## 2019-11-07 NOTE — Progress Notes (Signed)
Progress Note  Patient Name: Richard Graham Date of Encounter: 11/07/2019  Primary Cardiologist: New   Subjective   Still grossly volume overloaded. Confused with wandering thought processes.   Inpatient Medications    Scheduled Meds: . furosemide  60 mg Intravenous BID  . metoprolol tartrate  25 mg Oral BID   Continuous Infusions: . amiodarone 30 mg/hr (11/07/19 1821)  . heparin 1,550 Units/hr (11/07/19 1821)   PRN Meds: acetaminophen **OR** acetaminophen, metoprolol tartrate, ondansetron **OR** ondansetron (ZOFRAN) IV   Vital Signs    Vitals:   11/07/19 1400 11/07/19 1500 11/07/19 1655 11/07/19 2001  BP: 97/86 112/74  106/86  Pulse: (!) 107 77  (!) 102  Resp:    18  Temp:   98.3 F (36.8 C) 99.1 F (37.3 C)  TempSrc:   Oral Oral  SpO2: 99% 97%  100%  Weight:      Height:        Intake/Output Summary (Last 24 hours) at 11/07/2019 2149 Last data filed at 11/07/2019 1841 Gross per 24 hour  Intake 1417.08 ml  Output 4525 ml  Net -3107.92 ml   Filed Weights   11/05/19 1515 11/06/19 1620 11/07/19 0515  Weight: 112 kg 123.4 kg 118.4 kg    Physical Exam   General: Obese, NAD Head: Normocephalic, atraumatic, sclera non-icteric, no xanthomas, clear, moist mucus membranes. Neck: Negative for carotid bruits. No JVD Lungs:Clear to ausculation bilaterally. Breathing is unlabored. Cardiovascular: Irregularly irregular with S1 S2. No murmurs Abdomen: Soft, non-tender, distended. No obvious abdominal masses. Extremities: 3+ BLE edema. Radial pulses 2+ bilaterally Neuro: Alert and oriented to person and place. Slightly confused about current situation. MAE spontaneously. Psych: Responds to questions somewhat appropriately with normal affect.    Labs    Chemistry Recent Labs  Lab 11/05/19 1721 11/05/19 1822 11/05/19 2053 11/05/19 2053 11/06/19 0312 11/06/19 1849 11/07/19 0006  NA 122*   < > 124*   < > 125* 126* 133*  K 5.0   < > 4.6   < > 4.2 3.7  3.8  CL 81*   < > 87*   < > 82* 77* 82*  CO2 28   < > 27   < > 33* 36* 42*  GLUCOSE 121*   < > 110*   < > 134* 129* 123*  BUN 17   < > 16   < > 16 13 13   CREATININE 0.77   < > 0.79   < > 0.90 0.86 0.86  CALCIUM 8.7*   < > 7.9*   < > 8.3* 8.7* 8.6*  PROT 6.0*  --  5.6*  --  5.6*  --   --   ALBUMIN 3.8  --  3.5  --  3.5  --   --   AST 30  --  34  --  21  --   --   ALT 23  --  19  --  21  --   --   ALKPHOS 63  --  58  --  60  --   --   BILITOT 1.5*  --  1.6*  --  1.3*  --   --   GFRNONAA >60   < > >60   < > >60 >60 >60  GFRAA >60   < > >60   < > >60 >60 >60  ANIONGAP 13   < > 10   < > 10 13 9    < > = values in  this interval not displayed.     Hematology Recent Labs  Lab 11/05/19 1822 11/06/19 0312 11/07/19 0006  WBC 8.6 8.3 8.8  RBC 5.04 4.78 4.91  HGB 15.2 14.5 14.9  HCT 47.0 45.5 47.4  MCV 93.3 95.2 96.5  MCH 30.2 30.3 30.3  MCHC 32.3 31.9 31.4  RDW 13.9 13.7 13.9  PLT 162 175 182    Cardiac EnzymesNo results for input(s): TROPONINI in the last 168 hours. No results for input(s): TROPIPOC in the last 168 hours.   BNP Recent Labs  Lab 11/05/19 1616  BNP 318.7*     DDimer No results for input(s): DDIMER in the last 168 hours.   Radiology    CT HEAD WO CONTRAST  Addendum Date: 11/06/2019   ADDENDUM REPORT: 11/06/2019 20:21 ADDENDUM: Study discussed by telephone with Provider M. Denny on 11/06/2019 at 2004 hours. Electronically Signed   By: Odessa Fleming M.D.   On: 11/06/2019 20:21   Result Date: 11/06/2019 CLINICAL DATA:  64 year old male with acute neurologic deficit. EXAM: CT HEAD WITHOUT CONTRAST TECHNIQUE: Contiguous axial images were obtained from the base of the skull through the vertex without intravenous contrast. COMPARISON:  None. FINDINGS: Brain: Abnormal rounded area of hypodensity in the anterior right temporal lobe, although seems to be inseparable from the right MCA vessels in that region (coronal image 28). No associated temporal lobe edema. No significant  mass effect. Cerebral volume is within normal limits for age. Normal gray-white matter differentiation elsewhere in the brain. No ventriculomegaly, midline shift or intracranial mass effect. No convincing acute intracranial hemorrhage. No cortically based acute infarct identified. Vascular: Mild Calcified atherosclerosis at the skull base. Suspected large, approximately 10 mm saccular aneurysm of the right MCA bifurcation corresponding to the right temporal lobe hyperdensity described above. No other No suspicious intracranial vascular hyperdensity. Skull: Negative. Sinuses/Orbits: Visualized paranasal sinuses and mastoids are clear. Other: Visualized orbits and scalp soft tissues are within normal limits. IMPRESSION: 1. Plain CT appearance suspicious for a Large Right MCA bifurcation aneurysm, estimated at 10 mm. No acute identified to suggest aneurysm rupture. And otherwise negative noncontrast CT appearance of the brain. 2. Considering symptoms of acute neurologic deficit, follow-up noncontrast Head MRI and Head MRA may be the best next imaging step. Electronically Signed: By: Odessa Fleming M.D. On: 11/06/2019 19:23   MR ANGIO HEAD WO CONTRAST  Result Date: 11/07/2019 CLINICAL DATA:  64 year old male with initial noncontrast head CT yesterday suggesting a large right MCA bifurcation aneurysm. Found to be in atrial fibrillation this admission with CHF. Improving encephalopathy, likely metabolic. EXAM: MRA HEAD WITHOUT CONTRAST TECHNIQUE: Angiographic images of the Circle of Willis were obtained using MRA technique without intravenous contrast. COMPARISON:  Noncontrast head CT yesterday. FINDINGS: No significant intracranial mass effect.  No ventriculomegaly. Antegrade flow in the posterior circulation with codominant distal vertebral arteries. No distal vertebral stenosis. Patent PICA origins and vertebrobasilar junction. Patent, mildly tortuous basilar artery without stenosis. Patent SCA and PCA origins which are  mildly ectatic. Posterior communicating arteries are diminutive or absent. Bilateral PCA branches are within normal limits. Antegrade flow in the anterior circulation, both ICA siphons. Mild to moderate siphon ectasia, no siphon stenosis. Ophthalmic artery origins are within normal limits, the right is mildly tortuous. Patent and mildly ectatic carotid termini, MCA and ACA origins. The proximal ACAs are also mildly ectatic. No anterior communicating artery aneurysm. Visible ACA branches are tortuous but otherwise negative. Left MCA M1 segment and bifurcation are patent without stenosis. Visible left  MCA branches are within normal limits. Right MCA M1 segment is patent without stenosis. There is an 11 mm diameter saccular aneurysm directed inferiorly and slightly laterally from the caudal aspect of the right MCA bifurcation. This has a broad aneurysm neck as seen on series 352, image 9. But beyond the bifurcation the visible right MCA branches are within normal limits. IMPRESSION: 1. Confirmed relatively large 11 mm Saccular Aneurysm of the Right MCA bifurcation. Recommend Neuro-endovascular NIR or Neurosurgery consultation to evaluate the appropriateness of potential treatment. 2. Superimposed generalized intracranial artery ectasia, but no arterial stenosis or branch occlusion identified. Electronically Signed   By: Odessa Fleming M.D.   On: 11/07/2019 16:13   MR BRAIN WO CONTRAST  Result Date: 11/07/2019 CLINICAL DATA:  64 year old male with initial noncontrast head CT yesterday suggesting a large right MCA bifurcation aneurysm. Found to be in atrial fibrillation this admission with CHF. Improving encephalopathy, likely metabolic. EXAM: MRI HEAD WITHOUT CONTRAST TECHNIQUE: Multiplanar, multiecho pulse sequences of the brain and surrounding structures were obtained without intravenous contrast. COMPARISON:  intracranial MRA today reported separately. Head CT yesterday. FINDINGS: Brain: No restricted diffusion to  suggest acute infarction. No midline shift, mass effect, evidence of mass lesion, ventriculomegaly, extra-axial collection or acute intracranial hemorrhage. Cervicomedullary junction and pituitary are within normal limits. Wallace Cullens and white matter signal is within normal limits for age throughout the brain. No cortical encephalomalacia or chronic cerebral blood products identified. Vascular: Major intracranial vascular flow voids are preserved. Abnormal 11 mm saccular flow void at the right MCA bifurcation, see comparison MRA. Skull and upper cervical spine: Normal visible cervical spine. Visualized bone marrow signal is within normal limits. Sinuses/Orbits: Negative. Other: Mastoids are clear. Visible internal auditory structures appear normal. Scalp and face soft tissues appear negative. IMPRESSION: 1. Right MCA aneurysm, see MRA today reported separately. 2. No acute intracranial abnormality and otherwise normal for age noncontrast MRI appearance of the brain. Electronically Signed   By: Odessa Fleming M.D.   On: 11/07/2019 16:19   ECHOCARDIOGRAM COMPLETE  Result Date: 11/06/2019    ECHOCARDIOGRAM REPORT   Patient Name:   PASHA BROAD Yeo Date of Exam: 11/06/2019 Medical Rec #:  [id number]         Height:       73.0 in Accession #:    [id number]        Weight:       247.0 lb Date of Birth:  March 28, 1956         BSA:          2.354 m Patient Age:    63 years          BP:           113/90 mmHg Patient Gender: M                 HR:           107 bpm. Exam Location:  Inpatient Procedure: 2D Echo Indications:    congestive heart failure 428.0  History:        Patient has no prior history of Echocardiogram examinations.                 Arrythmias:Atrial Fibrillation; Signs/Symptoms:Shortness of                 Breath and lower extremity edema.  Sonographer:    Delcie Roch Referring Phys: [id number] RINKA R PAHWANI IMPRESSIONS  1. LV function is severely reduced, 20-25%. The mid anteroseptum into  apex is akinetic, almost  aneurysmal, consistent with prior LAD infarction (Q waves on EKG). The myocardium is thinned, futher supporting prior infarction. On contrast imaging, there is no LV thrombus seen.  2. Left ventricular ejection fraction, by estimation, is 20 to 25%. The left ventricle has severely decreased function. The left ventricle demonstrates regional wall motion abnormalities (see scoring diagram/findings for description). There is mild concentric left ventricular hypertrophy. Left ventricular diastolic function could not be evaluated.  3. Right ventricular systolic function is mildly reduced. The right ventricular size is normal. There is mildly elevated pulmonary artery systolic pressure. The estimated right ventricular systolic pressure is 42.2 mmHg.  4. Left atrial size was mildly dilated.  5. The mitral valve is grossly normal. Trivial mitral valve regurgitation. No evidence of mitral stenosis.  6. The aortic valve is tricuspid. Aortic valve regurgitation is not visualized. No aortic stenosis is present. Conclusion(s)/Recommendation(s): Findings consistent with ischemic cardiomyopathy. FINDINGS  Left Ventricle: Left ventricular ejection fraction, by estimation, is 20 to 25%. The left ventricle has severely decreased function. The left ventricle demonstrates regional wall motion abnormalities. Definity contrast agent was given IV to delineate the left ventricular endocardial borders. The left ventricular internal cavity size was normal in size. There is mild concentric left ventricular hypertrophy. Left ventricular diastolic function could not be evaluated due to atrial fibrillation. Left ventricular diastolic function could not be evaluated.  LV Wall Scoring: The mid and distal anterior septum, apical anterior segment, and apex are akinetic. Right Ventricle: The right ventricular size is normal. No increase in right ventricular wall thickness. Right ventricular systolic function is mildly reduced. There is mildly  elevated pulmonary artery systolic pressure. The tricuspid regurgitant velocity  is 2.61 m/s, and with an assumed right atrial pressure of 15 mmHg, the estimated right ventricular systolic pressure is 42.2 mmHg. Left Atrium: Left atrial size was mildly dilated. Right Atrium: Right atrial size was normal in size. Pericardium: There is no evidence of pericardial effusion. Mitral Valve: The mitral valve is grossly normal. Trivial mitral valve regurgitation. No evidence of mitral valve stenosis. Tricuspid Valve: The tricuspid valve is grossly normal. Tricuspid valve regurgitation is mild . No evidence of tricuspid stenosis. Aortic Valve: The aortic valve is tricuspid. Aortic valve regurgitation is not visualized. No aortic stenosis is present. Pulmonic Valve: The pulmonic valve was grossly normal. Pulmonic valve regurgitation is not visualized. No evidence of pulmonic stenosis. Aorta: The aortic root is normal in size and structure. IAS/Shunts: The interatrial septum appears to be lipomatous. No atrial level shunt detected by color flow Doppler. Additional Comments: LV function is severely reduced, 20-25%. The mid anteroseptum into apex is akinetic, almost aneurysmal, consistent with prior LAD infarction (Q waves on EKG). The myocardium is thinned, futher supporting prior infarction. On contrast  imaging, there is no LV thrombus seen.  LEFT VENTRICLE PLAX 2D LVIDd:         5.80 cm LVIDs:         4.60 cm LV PW:         1.20 cm LV IVS:        1.20 cm LVOT diam:     2.30 cm LV SV:         61 LV SV Index:   26 LVOT Area:     4.15 cm  RIGHT VENTRICLE RV S prime:     11.60 cm/s TAPSE (M-mode): 1.4 cm LEFT ATRIUM           Index  RIGHT ATRIUM           Index LA diam:      5.30 cm 2.25 cm/m  RA Area:     17.10 cm LA Vol (A2C): 62.1 ml 26.39 ml/m RA Volume:   46.60 ml  19.80 ml/m LA Vol (A4C): 82.4 ml 35.01 ml/m  AORTIC VALVE LVOT Vmax:   86.20 cm/s LVOT Vmean:  57.800 cm/s LVOT VTI:    0.146 m  AORTA Ao Root diam:  3.40 cm TRICUSPID VALVE TR Peak grad:   27.2 mmHg TR Vmax:        261.00 cm/s  SHUNTS Systemic VTI:  0.15 m Systemic Diam: 2.30 cm Eleonore Chiquito MD Electronically signed by Eleonore Chiquito MD Signature Date/Time: 11/06/2019/4:33:04 PM    Final    Telemetry    AF rates 120-140 - Personally Reviewed  ECG    No new tracing as of 11/06/19- Personally Reviewed  Cardiac Studies   Echocardiogram 11/06/19: as above, EF 20-25%  Patient Profile     64 y.o. male with a history of obesity, pre-DM 2, gout and new onset atrial fibrillation with CHF  Assessment & Plan    1.  New onset atrial fibrillation with RVR -Presented with SOB and weight gain (40-50lb) for the last 4 weeks however patient suspects he has been in AF for a while  - IV cardizem stopped, will start IV amiodarone  -CHADS2VASC score is 1 (new CHF) -IV heparin gtt for now. Patient has large intracranial aneurysm that will require management.  2.  New onset CHF -Reports a 40-50lb weight gain in last 3-4 weeks -CXR with interstitial edema on presentation  -BNP 318 which may be underestimated in setting of obesity -Presumably related to afib with RVR - lasix 60 iv bid today, will transition to lasix 80 mg Tid IV tomorrow due to continued gross volume overload.  -Weight, 247lb on admission  -I&O, no UO recorded  -Creatinine stable  3.  Obesity -BMI, 32.59   Elouise Munroe 11/07/19 9:49 PM

## 2019-11-07 NOTE — Progress Notes (Addendum)
PT is now  alert 4X, he stated "demons tried to kill me and I saw God put on this Mask". Pt is aware on the dates, the day and when he was transferred but unable to recall what happened yesterday at 6pm. He even stated what meds he was on. Explained what happened and needs on Bipap for now. Pt is compliant. Responds to command. Will monitor. BP 101/78 (BP Location: Right Arm)   Pulse 95   Temp 97.8 F (36.6 C)   Resp 18   Ht 6\' 4"  (1.93 m)   Wt 123.4 kg   SpO2 98%   BMI 33.11 kg/m   Bed weight was obtain concerning safety d/t being somnolent.

## 2019-11-07 NOTE — Progress Notes (Addendum)
Received a call from Haley,PharmD to resume Heparin drip @ 14.5.

## 2019-11-07 NOTE — Progress Notes (Signed)
RT placed pt on BIPAP V60 per pt request. Pt not in any distress at this time prior to placement pt respiratory status was stable. Pt currently tolerating well. RT will continue to monitor.

## 2019-11-07 NOTE — Progress Notes (Signed)
  ReDS Clip Diuretic Study Pt study # [id number]  Your patient has been enrolled in the ReDS Clip Diuretic Study   Changes to prescribed diuretics recommended:  Good uop -6L overnight  - Agree with furosemide 80mg  IV bid  Provider contacted:  Recommendation     REDS Clip  READING= 44  CHEST RULER = 38 Clip Station = D   Orthodema score =  Signs/Symptoms Score   Mild edema, no orthopnea 0 No congestion  Moderate edema, no orthopnea 1 Low-grade orthodema/congestion  Severe edema OR orthopnea 2   Moderate edema and orthopnea 3 High-grade orthodema/congestion  Severe edema AND orthopnea 4      Pharm.D. CPP, BCPS Clinical Pharmacist [phone] 11/07/2019 7:23 AM

## 2019-11-08 ENCOUNTER — Encounter (HOSPITAL_COMMUNITY): Payer: Self-pay | Admitting: Internal Medicine

## 2019-11-08 DIAGNOSIS — R0689 Other abnormalities of breathing: Secondary | ICD-10-CM

## 2019-11-08 DIAGNOSIS — I671 Cerebral aneurysm, nonruptured: Secondary | ICD-10-CM

## 2019-11-08 LAB — CBC WITH DIFFERENTIAL/PLATELET
Abs Immature Granulocytes: 0.03 10*3/uL (ref 0.00–0.07)
Basophils Absolute: 0.1 10*3/uL (ref 0.0–0.1)
Basophils Relative: 1 %
Eosinophils Absolute: 0.1 10*3/uL (ref 0.0–0.5)
Eosinophils Relative: 1 %
HCT: 48.3 % (ref 39.0–52.0)
Hemoglobin: 15 g/dL (ref 13.0–17.0)
Immature Granulocytes: 0 %
Lymphocytes Relative: 22 %
Lymphs Abs: 1.8 10*3/uL (ref 0.7–4.0)
MCH: 29.9 pg (ref 26.0–34.0)
MCHC: 31.1 g/dL (ref 30.0–36.0)
MCV: 96.2 fL (ref 80.0–100.0)
Monocytes Absolute: 0.9 10*3/uL (ref 0.1–1.0)
Monocytes Relative: 11 %
Neutro Abs: 5.3 10*3/uL (ref 1.7–7.7)
Neutrophils Relative %: 65 %
Platelets: 161 10*3/uL (ref 150–400)
RBC: 5.02 MIL/uL (ref 4.22–5.81)
RDW: 14.2 % (ref 11.5–15.5)
WBC: 8.2 10*3/uL (ref 4.0–10.5)
nRBC: 0 % (ref 0.0–0.2)

## 2019-11-08 LAB — CBC
HCT: 43.7 % (ref 39.0–52.0)
Hemoglobin: 13.8 g/dL (ref 13.0–17.0)
MCH: 30.2 pg (ref 26.0–34.0)
MCHC: 31.6 g/dL (ref 30.0–36.0)
MCV: 95.6 fL (ref 80.0–100.0)
Platelets: ADEQUATE 10*3/uL (ref 150–400)
RBC: 4.57 MIL/uL (ref 4.22–5.81)
RDW: 14.2 % (ref 11.5–15.5)
WBC: 7.7 10*3/uL (ref 4.0–10.5)
nRBC: 0 % (ref 0.0–0.2)

## 2019-11-08 LAB — BASIC METABOLIC PANEL
Anion gap: 12 (ref 5–15)
BUN: 8 mg/dL (ref 8–23)
CO2: 39 mmol/L — ABNORMAL HIGH (ref 22–32)
Calcium: 8.6 mg/dL — ABNORMAL LOW (ref 8.9–10.3)
Chloride: 82 mmol/L — ABNORMAL LOW (ref 98–111)
Creatinine, Ser: 0.95 mg/dL (ref 0.61–1.24)
GFR calc Af Amer: >60 mL/min (ref >60)
GFR calc non Af Amer: >60 mL/min (ref >60)
Glucose, Bld: 156 mg/dL — ABNORMAL HIGH (ref 70–99)
Potassium: 3.4 mmol/L — ABNORMAL LOW (ref 3.5–5.1)
Sodium: 133 mmol/L — ABNORMAL LOW (ref 135–145)

## 2019-11-08 LAB — HEPARIN LEVEL (UNFRACTIONATED)
Heparin Unfractionated: 0.27 IU/mL — ABNORMAL LOW (ref 0.30–0.70)
Heparin Unfractionated: 0.29 IU/mL — ABNORMAL LOW (ref 0.30–0.70)

## 2019-11-08 LAB — MAGNESIUM: Magnesium: 1.9 mg/dL (ref 1.7–2.4)

## 2019-11-08 MED ORDER — POTASSIUM CHLORIDE CRYS ER 20 MEQ PO TBCR
40.0000 meq | EXTENDED_RELEASE_TABLET | Freq: Two times a day (BID) | ORAL | Status: DC
  Administered 2019-11-08 – 2019-11-10 (×5): 40 meq via ORAL
  Filled 2019-11-08 (×5): qty 2

## 2019-11-08 NOTE — Progress Notes (Signed)
Marland Kitchen  PROGRESS NOTE    NIGIL BRAMAN  OFB:[id number] DOB: 1956-08-13 DOA: 11/05/2019 PCP: [name], MD   Brief Narrative:   64 year old with history of prediabetes, obesity, gout admitted for worsening shortness of breath and lower extremity swelling.  Found to be in atrial fibrillation, new diagnosis.  Chest x-ray showed pulmonary edema.  Cardiology team was consulted, started on diuretics, heparin drip and Cardizem drip.  Suspected his hyponatremia was secondary to fluid overload.  3/25: Reports improved breathing. Continue lasix. Appreciate cards and IR assistance. He is improved. Continue current Tx.    Assessment & Plan:   Principal Problem:   Acute CHF (congestive heart failure) (HCC) Active Problems:   Gout   Thrombocytopenia (HCC)   New onset a-fib (HCC)   Morbid obesity (HCC)  Acute congestive heart failure, reduced ejection fraction 20-25%. Ischemic cardiomyopathy     - Echocardiogram showing EF 20-25%, akinetic apex.       - 2 g salt diet low fluid restriction     - on lasix 80 IV TID and kidneys are tolerating; continue for now     - apprecaite cards assistance  New onset atrial fibrillation     - continue heparin/amiodarone gtt     - defer long-term plan to cards  Acute metabolic encephalopathy, improved CO2 narcosis; improved.  Right MCA bifurcation aneurysm, 10 mm     - CT of the head is negative for any acute bleed but coincidental finding of large right MCA bifurcation aneurysm, 10 mm.     - Dicussed with Dr [name], he will see the patient.  Patient will require angiogram.     - MRI brain and MRA head has been ordered; see below     - appreciate IR assistance, angiography after heart stabilization  Thrombocytopenia     - trend, no evidence of bleed  DVT prophylaxis: heparin gtt Code Status: FULL Family Communication: Spoke with wife at bedside   Disposition Plan: To remain inpt d/t ongoing need for IV lasix, heparin gtt, amiodarone gtt.    Consultants:   IR  Cardiology  ROS:  Denies CP, N, V. . Remainder 10-pt ROS is negative for all not previously mentioned.  Subjective: "I liked Dr. Reesa Chew."  Objective: Vitals:   11/08/19 0911 11/08/19 1000 11/08/19 1117 11/08/19 1656  BP:  111/86 99/66 94/77   Pulse: (!) 106  94 67  Resp:   18 18  Temp:   98.8 F (37.1 C) 98.4 F (36.9 C)  TempSrc:   Oral Oral  SpO2:   93% 95%  Weight:      Height:        Intake/Output Summary (Last 24 hours) at 11/08/2019 1741 Last data filed at 11/08/2019 1245 Gross per 24 hour  Intake 1340 ml  Output 5750 ml  Net -4410 ml   Filed Weights   11/06/19 1620 11/07/19 0515 11/08/19 0443  Weight: 123.4 kg 118.4 kg 123.2 kg    Examination:  General: 65 y.o. male resting in bed in NAD Cardiovascular: RRR, +S1, S2, no m/g/r, equal pulses throughout Respiratory: CTABL, no w/r/r, normal WOB GI: BS+, NDNT, no masses noted, no organomegaly noted MSK: No c/c, BLE edema Neuro: alert to name, follows commands Psyc: Appropriate interaction and affect, calm/cooperative   Data Reviewed: I have personally reviewed following labs and imaging studies.  CBC: Recent Labs  Lab 11/05/19 1616 11/05/19 1822 11/06/19 0312 11/07/19 0006 11/08/19 0439  WBC 8.6 8.6 8.3 8.8 7.7  HGB 15.3 15.2  14.5 14.9 13.8  HCT 47.0 47.0 45.5 47.4 43.7  MCV 93.1 93.3 95.2 96.5 95.6  PLT 104* 162 175 182 PLATELET CLUMPS NOTED ON SMEAR, COUNT APPEARS ADEQUATE   Basic Metabolic Panel: Recent Labs  Lab 11/05/19 1721 11/05/19 1822 11/05/19 2053 11/06/19 0312 11/06/19 1849 11/07/19 0006 11/08/19 0439  NA   < >  --  124* 125* 126* 133* 133*  K   < >  --  4.6 4.2 3.7 3.8 3.4*  CL   < >  --  87* 82* 77* 82* 82*  CO2   < >  --  27 33* 36* 42* 39*  GLUCOSE   < >  --  110* 134* 129* 123* 156*  BUN   < >  --  16 16 13 13 8   CREATININE   < > 0.76 0.79 0.90 0.86 0.86 0.95  CALCIUM   < >  --  7.9* 8.3* 8.7* 8.6* 8.6*  MG  --  2.1  --   --   --   --  1.9  PHOS   --  4.0  --   --   --   --   --    < > = values in this interval not displayed.   GFR: Estimated Creatinine Clearance: 117.1 mL/min (by C-G formula based on SCr of 0.95 mg/dL). Liver Function Tests: Recent Labs  Lab 11/05/19 1721 11/05/19 2053 11/06/19 0312  AST 30 34 21  ALT 23 19 21   ALKPHOS 63 58 60  BILITOT 1.5* 1.6* 1.3*  PROT 6.0* 5.6* 5.6*  ALBUMIN 3.8 3.5 3.5   No results for input(s): LIPASE, AMYLASE in the last 168 hours. No results for input(s): AMMONIA in the last 168 hours. Coagulation Profile: No results for input(s): INR, PROTIME in the last 168 hours. Cardiac Enzymes: No results for input(s): CKTOTAL, CKMB, CKMBINDEX, TROPONINI in the last 168 hours. BNP (last 3 results) No results for input(s): PROBNP in the last 8760 hours. HbA1C: Recent Labs    11/05/19 1824  HGBA1C 6.0*   CBG: Recent Labs  Lab 11/06/19 1737  GLUCAP 127*   Lipid Profile: Recent Labs    11/05/19 1822 11/07/19 0500  CHOL 102 103  HDL 32* 40*  LDLCALC 61 56  TRIG 43 36  CHOLHDL 3.2 2.6   Thyroid Function Tests: No results for input(s): TSH, T4TOTAL, FREET4, T3FREE, THYROIDAB in the last 72 hours. Anemia Panel: No results for input(s): VITAMINB12, FOLATE, FERRITIN, TIBC, IRON, RETICCTPCT in the last 72 hours. Sepsis Labs: No results for input(s): PROCALCITON, LATICACIDVEN in the last 168 hours.  Recent Results (from the past 240 hour(s))  SARS CORONAVIRUS 2 (TAT 6-24 HRS) Nasopharyngeal Nasopharyngeal Swab     Status: None   Collection Time: 11/05/19  6:56 PM   Specimen: Nasopharyngeal Swab  Result Value Ref Range Status   SARS Coronavirus 2 NEGATIVE NEGATIVE Final    Comment: (NOTE) SARS-CoV-2 target nucleic acids are NOT DETECTED. The SARS-CoV-2 RNA is generally detectable in upper and lower respiratory specimens during the acute phase of infection. Negative results do not preclude SARS-CoV-2 infection, do not rule out co-infections with other pathogens, and should  not be used as the sole basis for treatment or other patient management decisions. Negative results must be combined with clinical observations, patient history, and epidemiological information. The expected result is Negative. Fact Sheet for Patients: 11/09/19 Fact Sheet for Healthcare Providers: 11/07/19 This test is not yet approved or cleared by the [employer]  FDA and  has been authorized for detection and/or diagnosis of SARS-CoV-2 by FDA under an Emergency Use Authorization (EUA). This EUA will remain  in effect (meaning this test can be used) for the duration of the COVID-19 declaration under Section 56 4(b)(1) of the Act, 21 U.S.C. section 360bbb-3(b)(1), unless the authorization is terminated or revoked sooner. Performed at [facility], [address] [place], [place] [address]       Radiology Studies: CT HEAD WO CONTRAST  Addendum Date: 11/06/2019   ADDENDUM REPORT: 11/06/2019 20:21 ADDENDUM: Study discussed by telephone with Provider M. Denny on 11/06/2019 at 2004 hours. Electronically Signed   By: Odessa Fleming M.D.   On: 11/06/2019 20:21   Result Date: 11/06/2019 CLINICAL DATA:  64 year old male with acute neurologic deficit. EXAM: CT HEAD WITHOUT CONTRAST TECHNIQUE: Contiguous axial images were obtained from the base of the skull through the vertex without intravenous contrast. COMPARISON:  None. FINDINGS: Brain: Abnormal rounded area of hypodensity in the anterior right temporal lobe, although seems to be inseparable from the right MCA vessels in that region (coronal image 28). No associated temporal lobe edema. No significant mass effect. Cerebral volume is within normal limits for age. Normal gray-white matter differentiation elsewhere in the brain. No ventriculomegaly, midline shift or intracranial mass effect. No convincing acute intracranial hemorrhage. No cortically based acute infarct identified.  Vascular: Mild Calcified atherosclerosis at the skull base. Suspected large, approximately 10 mm saccular aneurysm of the right MCA bifurcation corresponding to the right temporal lobe hyperdensity described above. No other No suspicious intracranial vascular hyperdensity. Skull: Negative. Sinuses/Orbits: Visualized paranasal sinuses and mastoids are clear. Other: Visualized orbits and scalp soft tissues are within normal limits. IMPRESSION: 1. Plain CT appearance suspicious for a Large Right MCA bifurcation aneurysm, estimated at 10 mm. No acute identified to suggest aneurysm rupture. And otherwise negative noncontrast CT appearance of the brain. 2. Considering symptoms of acute neurologic deficit, follow-up noncontrast Head MRI and Head MRA may be the best next imaging step. Electronically Signed: By: Odessa Fleming M.D. On: 11/06/2019 19:23   MR ANGIO HEAD WO CONTRAST  Result Date: 11/07/2019 CLINICAL DATA:  64 year old male with initial noncontrast head CT yesterday suggesting a large right MCA bifurcation aneurysm. Found to be in atrial fibrillation this admission with CHF. Improving encephalopathy, likely metabolic. EXAM: MRA HEAD WITHOUT CONTRAST TECHNIQUE: Angiographic images of the Circle of Willis were obtained using MRA technique without intravenous contrast. COMPARISON:  Noncontrast head CT yesterday. FINDINGS: No significant intracranial mass effect.  No ventriculomegaly. Antegrade flow in the posterior circulation with codominant distal vertebral arteries. No distal vertebral stenosis. Patent PICA origins and vertebrobasilar junction. Patent, mildly tortuous basilar artery without stenosis. Patent SCA and PCA origins which are mildly ectatic. Posterior communicating arteries are diminutive or absent. Bilateral PCA branches are within normal limits. Antegrade flow in the anterior circulation, both ICA siphons. Mild to moderate siphon ectasia, no siphon stenosis. Ophthalmic artery origins are within normal  limits, the right is mildly tortuous. Patent and mildly ectatic carotid termini, MCA and ACA origins. The proximal ACAs are also mildly ectatic. No anterior communicating artery aneurysm. Visible ACA branches are tortuous but otherwise negative. Left MCA M1 segment and bifurcation are patent without stenosis. Visible left MCA branches are within normal limits. Right MCA M1 segment is patent without stenosis. There is an 11 mm diameter saccular aneurysm directed inferiorly and slightly laterally from the caudal aspect of the right MCA bifurcation. This has a broad aneurysm  neck as seen on series 352, image 9. But beyond the bifurcation the visible right MCA branches are within normal limits. IMPRESSION: 1. Confirmed relatively large 11 mm Saccular Aneurysm of the Right MCA bifurcation. Recommend Neuro-endovascular NIR or Neurosurgery consultation to evaluate the appropriateness of potential treatment. 2. Superimposed generalized intracranial artery ectasia, but no arterial stenosis or branch occlusion identified. Electronically Signed   By: Odessa Fleming M.D.   On: 11/07/2019 16:13   MR BRAIN WO CONTRAST  Result Date: 11/07/2019 CLINICAL DATA:  64 year old male with initial noncontrast head CT yesterday suggesting a large right MCA bifurcation aneurysm. Found to be in atrial fibrillation this admission with CHF. Improving encephalopathy, likely metabolic. EXAM: MRI HEAD WITHOUT CONTRAST TECHNIQUE: Multiplanar, multiecho pulse sequences of the brain and surrounding structures were obtained without intravenous contrast. COMPARISON:  intracranial MRA today reported separately. Head CT yesterday. FINDINGS: Brain: No restricted diffusion to suggest acute infarction. No midline shift, mass effect, evidence of mass lesion, ventriculomegaly, extra-axial collection or acute intracranial hemorrhage. Cervicomedullary junction and pituitary are within normal limits. Wallace Cullens and white matter signal is within normal limits for age  throughout the brain. No cortical encephalomalacia or chronic cerebral blood products identified. Vascular: Major intracranial vascular flow voids are preserved. Abnormal 11 mm saccular flow void at the right MCA bifurcation, see comparison MRA. Skull and upper cervical spine: Normal visible cervical spine. Visualized bone marrow signal is within normal limits. Sinuses/Orbits: Negative. Other: Mastoids are clear. Visible internal auditory structures appear normal. Scalp and face soft tissues appear negative. IMPRESSION: 1. Right MCA aneurysm, see MRA today reported separately. 2. No acute intracranial abnormality and otherwise normal for age noncontrast MRI appearance of the brain. Electronically Signed   By: Odessa Fleming M.D.   On: 11/07/2019 16:19     Scheduled Meds:  furosemide  80 mg Intravenous TID   metoprolol tartrate  25 mg Oral BID   potassium chloride  40 mEq Oral BID   Continuous Infusions:  amiodarone 30 mg/hr (11/08/19 0642)   heparin 1,700 Units/hr (11/08/19 1724)     LOS: 3 days    Time spent: 25 minutes spent in the coordination of care today.    Teddy Spike, DO [facility]  If 7PM-7AM, please contact night-coverage [email or web address] 11/08/2019, 5:41 PM

## 2019-11-08 NOTE — Progress Notes (Signed)
  ReDS Clip Diuretic Study Pt study # [id number]  Your patient has been enrolled in the ReDS Clip Diuretic Study   Changes to prescribed diuretics recommended:  Good uop over last 2 days but still volume overloaded - would recommend increasing  furosemide back to 80mg  IV bid  Provider contacted:  Recommendation     REDS Clip  READING= 48  CHEST RULER = 38 Clip Station = D   Orthodema score =  Signs/Symptoms Score   Mild edema, no orthopnea 0 No congestion  Moderate edema, no orthopnea 1 Low-grade orthodema/congestion  Severe edema OR orthopnea 2   Moderate edema and orthopnea 3 High-grade orthodema/congestion  Severe edema AND orthopnea 4      Jacques Navy Pharm.D. CPP, BCPS Clinical Pharmacist [phone] 11/08/2019 7:23 AM

## 2019-11-08 NOTE — Care Management (Signed)
Benefit check sent for Eliquis and Xaralto 

## 2019-11-08 NOTE — TOC Benefit Eligibility Note (Signed)
Transition of Care [facility]) Benefit Eligibility Note    Patient Details  Name: Richard Graham MRN: [id number] Date of Birth: 1956-04-06  XARELTO  20 MG DAILY, CO-PAY $489.67  And  ELIQUIS  5 MG BID , CO-PAY-$496.19   Covered?: Yes  Tier: 2 Drug  Prescription Coverage Preferred Pharmacy: [facility]  Spoke with Person/Company/Phone Number:: ABBY   @ [employer] # [phone]  Co-Pay: $ 489.65  Prior Approval: No  Deductible: Unmet(OUT-OF-POCKET: NOT MET)       Memory Argue Phone Number: 11/08/2019, 10:41 AM

## 2019-11-08 NOTE — Progress Notes (Signed)
ANTICOAGULATION CONSULT NOTE - Follow Up Consult  Pharmacy Consult for heparin Indication: atrial fibrillation  No Known Allergies  Patient Measurements: Height: 6\' 4"  (193 cm) Weight: 271 lb 9.7 oz (123.2 kg) IBW/kg (Calculated) : 86.8 Heparin Dosing Weight: 103.5  Vital Signs: Temp: 98.6 F (37 C) (03/25 0739) Temp Source: Oral (03/25 0739) BP: 97/70 (03/25 0739) Pulse Rate: 103 (03/25 0739)  Labs: Recent Labs    11/05/19 1616 11/05/19 1731 11/05/19 1822 11/05/19 1822 11/05/19 2053 11/06/19 0312 11/06/19 0701 11/06/19 1623 11/06/19 1849 11/07/19 0006 11/07/19 1648 11/08/19 0439  HGB   < >  --  15.2   < >  --  14.5   < >  --   --  14.9  --  13.8  HCT   < >  --  47.0   < >  --  45.5  --   --   --  47.4  --  43.7  PLT   < >  --  162   < >  --  175  --   --   --  182  --  PLATELET CLUMPS NOTED ON SMEAR, COUNT APPEARS ADEQUATE  HEPARINUNFRC  --   --   --   --   --   --    < >   < >  --  0.35 0.27* 0.27*  CREATININE   < >  --  0.76   < > 0.79 0.90   < >  --  0.86 0.86  --  0.95  TROPONINIHS  --  11 14  --  12  --   --   --   --   --   --   --    < > = values in this interval not displayed.    Estimated Creatinine Clearance: 117.1 mL/min (by C-G formula based on SCr of 0.95 mg/dL).   Medical History: Past Medical History:  Diagnosis Date  . Gout     Medications:  Scheduled:  . furosemide  80 mg Intravenous TID  . metoprolol tartrate  25 mg Oral BID   Infusions:  . amiodarone 30 mg/hr (11/08/19 11/10/19)  . heparin 1,550 Units/hr (11/08/19 11/10/19)    Assessment: Mr Stenzel is a 64 y/o M presenting with new-onset atrial fibrillation with CHF. He is not on any anticoagulation PTA. His CHADS2VASC score is 1 (new CHF).  IV heparin was held overnight with large R MCA bifurcation aneurysm (no evidence to suggest rupture). Per discussion with Dr. 77 3/24 AM, he would like to resume heparin and defer further recommendations to Cardiology and Neuro IR. Will target  lower heparin level goal with aneurysm.  Heparin level this morning remains slightly below goal at 0.27 on 1550 units/hr. No bleeding or issues with infusion per discussion with RN.  Goal of Therapy:  Heparin level 0.3-0.5 units/ml Monitor platelets by anticoagulation protocol: Yes   Plan:  Increase IV heparin to 1650 units/hr  Recheck heparin level in 6 hrs Monitor daily heparin level and CBC, s/sx bleeding F/u Cardiology/Neuro IR plans   4/24, PharmD, BCPS Please check AMION for all [facility] contact numbers Clinical Pharmacist 11/08/2019 8:39 AM

## 2019-11-08 NOTE — Progress Notes (Signed)
ANTICOAGULATION CONSULT NOTE - Follow Up Consult  Pharmacy Consult for heparin Indication: atrial fibrillation  No Known Allergies  Patient Measurements: Height: 6\' 4"  (193 cm) Weight: 271 lb 9.7 oz (123.2 kg) IBW/kg (Calculated) : 86.8 Heparin Dosing Weight: 103.5  Vital Signs: Temp: 98.4 F (36.9 C) (03/25 1656) Temp Source: Oral (03/25 1656) BP: 94/77 (03/25 1656) Pulse Rate: 67 (03/25 1656)  Labs: Recent Labs    11/05/19 1721 11/05/19 1731 11/05/19 1822 11/05/19 1822 11/05/19 2053 11/06/19 0312 11/06/19 0701 11/06/19 1623 11/06/19 1849 11/07/19 0006 11/07/19 0006 11/07/19 1648 11/08/19 0439 11/08/19 1527  HGB  --   --  15.2   < >  --  14.5   < >  --   --  14.9  --   --  13.8  --   HCT  --   --  47.0   < >  --  45.5  --   --   --  47.4  --   --  43.7  --   PLT  --   --  162   < >  --  175  --   --   --  182  --   --  PLATELET CLUMPS NOTED ON SMEAR, COUNT APPEARS ADEQUATE  --   HEPARINUNFRC  --   --   --   --   --   --    < >   < >  --  0.35   < > 0.27* 0.27* 0.29*  CREATININE   < >  --  0.76   < > 0.79 0.90   < >  --  0.86 0.86  --   --  0.95  --   TROPONINIHS  --  11 14  --  12  --   --   --   --   --   --   --   --   --    < > = values in this interval not displayed.    Estimated Creatinine Clearance: 117.1 mL/min (by C-G formula based on SCr of 0.95 mg/dL).   Medical History: Past Medical History:  Diagnosis Date  . CHF (congestive heart failure) (HCC)   . Gout   . New onset atrial fibrillation [facility])     Assessment: Richard Graham is a 64 yr old male presenting with new-onset atrial fibrillation with CHF. He is not on any anticoagulation PTA. His CHADS2VASC score is 1 (new CHF).  IV heparin was held overnight 3/23-3/24 with large R MCA bifurcation aneurysm (no evidence to suggest rupture). Per discussion with Dr. 11-19-1969 3/24 AM, he would like to resume heparin and defer further recommendations to Cardiology and Neuro IR. Will target lower heparin level  goal with aneurysm.  Heparin level ~6 hrs after heparin infusion was increased to 1650 units/hr was 0.29 units/ml, which remains slightly below the goal range for this pt. CBC WNL. Per RN, no issues with IV or bleeding observed.  Goal of Therapy:  Heparin level 0.3-0.5 units/ml Monitor platelets by anticoagulation protocol: Yes   Plan:  Increase IV heparin to 1700 units/hr  Check heparin level in 6 hrs Monitor daily heparin level and CBC Monitor for signs/symptoms of bleeding F/u Cardiology/Neuro IR plans  4/24, PharmD, BCPS, [facility] Clinical Pharmacist 11/08/2019 5:00 PM

## 2019-11-08 NOTE — Consult Note (Signed)
Chief Complaint: Patient was seen in consultation today for R MCA bifurcation aneurysm  Supervising Physician: Luanne Bras  Patient Status: [facility] - In-pt  History of Present Illness: Richard Graham is a 64 y.o. male with medical history significant of prediabetes, obesity, gout presents to emergency department due to worsening shortness of breath and leg swelling since 3 weeks.  He has experienced a 40-50 lbs recent weight gain and is now undergoing work-up for new diagnosis of CHF and a fib with RVR.  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 NIR was consulted for possible intervention.  Patient assessed at bedside this afternoon alongside Dr. DEstanislado Pandy  His wife is at bedside.  He is much less confused today.   He appropriately answers questions and participates in conversation.  His wife confirms that patient's sister also has a history of brain aneurysm s/p surgical intervention.     Past Medical History:  Diagnosis Date  . CHF (congestive heart failure) ([facility]   . Gout   . New onset atrial fibrillation ([facility]     Past Surgical History:  Procedure Laterality Date  . INSERTION OF MESH N/A 06/27/2015   Procedure: INSERTION OF MESH;  Surgeon: ARalene Ok MD;  Location: [facility] ORS;  Service: General;  Laterality: N/A;  . laceration to fingers     left hand  . MOUTH SURGERY     secondary to abscess   . trauma to lip      sutured  . VENTRAL HERNIA REPAIR N/A 06/27/2015   Procedure: LAPAROSCOPIC VENTRAL HERNIA;  Surgeon: ARalene Ok MD;  Location: [facility] ORS;  Service: General;  Laterality: N/A;    Allergies: Patient has no known allergies.  Medications: Prior to Admission medications   Medication Sig Start Date End Date Taking? Authorizing Provider  aspirin (ASPIRIN EC) 81 MG EC tablet Take 81 mg by mouth daily.   Yes [provider]    colchicine 0.6 MG tablet Take 0.6 mg by mouth 2 (two) times daily as needed (for gout flares).    Yes [provider]  diclofenac sodium (VOLTAREN) 1 % GEL Apply 2 g topically 2 (two) times daily as needed (to affected sites- for arthritis pain).    Yes [provider]  Omega-3 Fatty Acids (FISH OIL) 1200 MG CAPS Take 1,200 mg by mouth every other day.   Yes [provider]  oxyCODONE-acetaminophen (PERCOCET) 10-325 MG tablet Take 1 tablet by mouth every 4 (four) hours as needed for pain. 06/27/15  Yes RRalene Ok MD  oxyCODONE-acetaminophen (ROXICET) 5-325 MG tablet Take 1-2 tablets by mouth every 4 (four) hours as needed. Patient not taking: Reported on 11/05/2019 06/27/15   RRalene Ok MD     History reviewed. No pertinent family history.  Social History   Socioeconomic History  . Marital status: Married    Spouse name: Not on file  . Number of children: Not on file  . Years of education: Not on file  . Highest education level: Not on file  Occupational History  . Not on file  Tobacco Use  . Smoking status: Former Smoker    Packs/day: 0.25    Years: 3.00    Pack years: 0.75    Types: Cigarettes    Quit date: 08/17/2011    Years since quitting: 8.2  . Smokeless tobacco: Never Used  Substance and Sexual Activity  . Alcohol use: Yes  Comment: rarely  . Drug use: No  . Sexual activity: Not on file  Other Topics Concern  . Not on file  Social History Narrative  . Not on file   Social Determinants of Health   Financial Resource Strain:   . Difficulty of Paying Living Expenses:   Food Insecurity:   . Worried About Charity fundraiser in the Last Year:   . Arboriculturist in the Last Year:   Transportation Needs:   . Film/video editor (Medical):   Marland Kitchen Lack of Transportation (Non-Medical):   Physical Activity:   . Days of Exercise per Week:   . Minutes of Exercise per Session:   Stress:   . Feeling of Stress :   Social  Connections:   . Frequency of Communication with Friends and Family:   . Frequency of Social Gatherings with Friends and Family:   . Attends Religious Services:   . Active Member of Clubs or Organizations:   . Attends Archivist Meetings:   Marland Kitchen Marital Status:      Review of Systems: A 12 point ROS discussed and pertinent positives are indicated in the HPI above.  All other systems are negative.  Review of Systems  Constitutional: Positive for unexpected weight change. Negative for fatigue.  Respiratory: Positive for shortness of breath. Negative for cough.   Cardiovascular: Negative for chest pain.  Gastrointestinal: Negative for abdominal pain, diarrhea, nausea and vomiting.  Genitourinary: Negative for dysuria.  Musculoskeletal: Negative for back pain.  Neurological: Negative for dizziness, facial asymmetry and headaches.  Psychiatric/Behavioral: Positive for confusion. Negative for behavioral problems.    Vital Signs: BP 99/66   Pulse 94   Temp 98.8 F (37.1 C) (Oral)   Resp 18   Ht _0  (1.93 m)   Wt 271 lb 9.7 oz (123.2 kg)   SpO2 93%   BMI 33.06 kg/m   Physical Exam Vitals and nursing note reviewed.  Constitutional:      Appearance: He is well-developed.  Cardiovascular:     Rate and Rhythm: Tachycardia present. Rhythm irregular.     Pulses: Normal pulses.  Pulmonary:     Effort: Pulmonary effort is normal.     Breath sounds: Normal breath sounds.  Musculoskeletal:     Right lower leg: Edema present.     Left lower leg: Edema present.  Skin:    General: Skin is warm and dry.  Neurological:     General: No focal deficit present.     Mental Status: He is alert.     Imaging: CT HEAD WO CONTRAST  Addendum Date: 11/06/2019   ADDENDUM REPORT: 11/06/2019 20:21 ADDENDUM: Study discussed by telephone with Provider M. Denny on 11/06/2019 at 2004 hours. Electronically Signed   By: Genevie Ann M.D.   On: 11/06/2019 20:21   Result Date: 11/06/2019 CLINICAL  DATA:  64 year old male with acute neurologic deficit. EXAM: CT HEAD WITHOUT CONTRAST TECHNIQUE: Contiguous axial images were obtained from the base of the skull through the vertex without intravenous contrast. COMPARISON:  None. FINDINGS: Brain: Abnormal rounded area of hypodensity in the anterior right temporal lobe, although seems to be inseparable from the right MCA vessels in that region (coronal image 28). No associated temporal lobe edema. No significant mass effect. Cerebral volume is within normal limits for age. Normal gray-white matter differentiation elsewhere in the brain. No ventriculomegaly, midline shift or intracranial mass effect. No convincing acute intracranial hemorrhage. No cortically based acute infarct identified. Vascular:  Mild Calcified atherosclerosis at the skull base. Suspected large, approximately 10 mm saccular aneurysm of the right MCA bifurcation corresponding to the right temporal lobe hyperdensity described above. No other No suspicious intracranial vascular hyperdensity. Skull: Negative. Sinuses/Orbits: Visualized paranasal sinuses and mastoids are clear. Other: Visualized orbits and scalp soft tissues are within normal limits. IMPRESSION: 1. Plain CT appearance suspicious for a Large Right MCA bifurcation aneurysm, estimated at 10 mm. No acute identified to suggest aneurysm rupture. And otherwise negative noncontrast CT appearance of the brain. 2. Considering symptoms of acute neurologic deficit, follow-up noncontrast Head MRI and Head MRA may be the best next imaging step. Electronically Signed: By: Genevie Ann M.D. On: 11/06/2019 19:23   MR ANGIO HEAD WO CONTRAST  Result Date: 11/07/2019 CLINICAL DATA:  64 year old male with initial noncontrast head CT yesterday suggesting a large right MCA bifurcation aneurysm. Found to be in atrial fibrillation this admission with CHF. Improving encephalopathy, likely metabolic. EXAM: MRA HEAD WITHOUT CONTRAST TECHNIQUE: Angiographic images  of the Circle of Willis were obtained using MRA technique without intravenous contrast. COMPARISON:  Noncontrast head CT yesterday. FINDINGS: No significant intracranial mass effect.  No ventriculomegaly. Antegrade flow in the posterior circulation with codominant distal vertebral arteries. No distal vertebral stenosis. Patent PICA origins and vertebrobasilar junction. Patent, mildly tortuous basilar artery without stenosis. Patent SCA and PCA origins which are mildly ectatic. Posterior communicating arteries are diminutive or absent. Bilateral PCA branches are within normal limits. Antegrade flow in the anterior circulation, both ICA siphons. Mild to moderate siphon ectasia, no siphon stenosis. Ophthalmic artery origins are within normal limits, the right is mildly tortuous. Patent and mildly ectatic carotid termini, MCA and ACA origins. The proximal ACAs are also mildly ectatic. No anterior communicating artery aneurysm. Visible ACA branches are tortuous but otherwise negative. Left MCA M1 segment and bifurcation are patent without stenosis. Visible left MCA branches are within normal limits. Right MCA M1 segment is patent without stenosis. There is an 11 mm diameter saccular aneurysm directed inferiorly and slightly laterally from the caudal aspect of the right MCA bifurcation. This has a broad aneurysm neck as seen on series 352, image 9. But beyond the bifurcation the visible right MCA branches are within normal limits. IMPRESSION: 1. Confirmed relatively large 11 mm Saccular Aneurysm of the Right MCA bifurcation. Recommend Neuro-endovascular NIR or Neurosurgery consultation to evaluate the appropriateness of potential treatment. 2. Superimposed generalized intracranial artery ectasia, but no arterial stenosis or branch occlusion identified. Electronically Signed   By: Genevie Ann M.D.   On: 11/07/2019 16:13   MR BRAIN WO CONTRAST  Result Date: 11/07/2019 CLINICAL DATA:  64 year old male with initial  noncontrast head CT yesterday suggesting a large right MCA bifurcation aneurysm. Found to be in atrial fibrillation this admission with CHF. Improving encephalopathy, likely metabolic. EXAM: MRI HEAD WITHOUT CONTRAST TECHNIQUE: Multiplanar, multiecho pulse sequences of the brain and surrounding structures were obtained without intravenous contrast. COMPARISON:  intracranial MRA today reported separately. Head CT yesterday. FINDINGS: Brain: No restricted diffusion to suggest acute infarction. No midline shift, mass effect, evidence of mass lesion, ventriculomegaly, extra-axial collection or acute intracranial hemorrhage. Cervicomedullary junction and pituitary are within normal limits. Pearline Cables and white matter signal is within normal limits for age throughout the brain. No cortical encephalomalacia or chronic cerebral blood products identified. Vascular: Major intracranial vascular flow voids are preserved. Abnormal 11 mm saccular flow void at the right MCA bifurcation, see comparison MRA. Skull and upper cervical spine: Normal visible cervical spine.  Visualized bone marrow signal is within normal limits. Sinuses/Orbits: Negative. Other: Mastoids are clear. Visible internal auditory structures appear normal. Scalp and face soft tissues appear negative. IMPRESSION: 1. Right MCA aneurysm, see MRA today reported separately. 2. No acute intracranial abnormality and otherwise normal for age noncontrast MRI appearance of the brain. Electronically Signed   By: Genevie Ann M.D.   On: 11/07/2019 16:19   DG Chest Portable 1 View  Result Date: 11/05/2019 CLINICAL DATA:  64 year old male with increasing shortness of breath x2 weeks. Abdominal and lower extremity edema. Wheezing. Found to be in atrial fibrillation with RVR. EXAM: PORTABLE CHEST 1 VIEW COMPARISON:  CT Abdomen and Pelvis 04/08/2015. FINDINGS: Portable AP semi upright view at 1539 hours. Possible new cardiomegaly since 2016. Other mediastinal contours are within normal  limits. Low lung volumes with combined patchy and veiling opacity at the right lung base. Generalized pulmonary vascular congestion. No pneumothorax. No left lung base opacity. No acute osseous abnormality identified. IMPRESSION: 1. Low lung volumes with suspected pulmonary interstitial edema with right lung base atelectasis and/or small pleural effusion. 2. Possible cardiomegaly, which would be new since 2016. Electronically Signed   By: Genevie Ann M.D.   On: 11/05/2019 15:54   ECHOCARDIOGRAM COMPLETE  Result Date: 11/06/2019    ECHOCARDIOGRAM REPORT   Patient Name:   Richard Graham Deal Date of Exam: 11/06/2019 Medical Rec #:  [id number]         Height:       73.0 in Accession #:    [id number]        Weight:       247.0 lb Date of Birth:  07-30-1956         BSA:          2.354 m Patient Age:    80 years          BP:           113/90 mmHg Patient Gender: M                 HR:           107 bpm. Exam Location:  Inpatient Procedure: 2D Echo Indications:    congestive heart failure 428.0  History:        Patient has no prior history of Echocardiogram examinations.                 Arrythmias:Atrial Fibrillation; Signs/Symptoms:Shortness of                 Breath and lower extremity edema.  Sonographer:    Johny Chess Referring Phys: [id number] RINKA R PAHWANI IMPRESSIONS  1. LV function is severely reduced, 20-25%. The mid anteroseptum into apex is akinetic, almost aneurysmal, consistent with prior LAD infarction (Q waves on EKG). The myocardium is thinned, futher supporting prior infarction. On contrast imaging, there is no LV thrombus seen.  2. Left ventricular ejection fraction, by estimation, is 20 to 25%. The left ventricle has severely decreased function. The left ventricle demonstrates regional wall motion abnormalities (see scoring diagram/findings for description). There is mild concentric left ventricular hypertrophy. Left ventricular diastolic function could not be evaluated.  3. Right ventricular systolic  function is mildly reduced. The right ventricular size is normal. There is mildly elevated pulmonary artery systolic pressure. The estimated right ventricular systolic pressure is 80.3 mmHg.  4. Left atrial size was mildly dilated.  5. The mitral valve is grossly normal. Trivial mitral valve regurgitation. No evidence of mitral  stenosis.  6. The aortic valve is tricuspid. Aortic valve regurgitation is not visualized. No aortic stenosis is present. Conclusion(s)/Recommendation(s): Findings consistent with ischemic cardiomyopathy. FINDINGS  Left Ventricle: Left ventricular ejection fraction, by estimation, is 20 to 25%. The left ventricle has severely decreased function. The left ventricle demonstrates regional wall motion abnormalities. Definity contrast agent was given IV to delineate the left ventricular endocardial borders. The left ventricular internal cavity size was normal in size. There is mild concentric left ventricular hypertrophy. Left ventricular diastolic function could not be evaluated due to atrial fibrillation. Left ventricular diastolic function could not be evaluated.  LV Wall Scoring: The mid and distal anterior septum, apical anterior segment, and apex are akinetic. Right Ventricle: The right ventricular size is normal. No increase in right ventricular wall thickness. Right ventricular systolic function is mildly reduced. There is mildly elevated pulmonary artery systolic pressure. The tricuspid regurgitant velocity  is 2.61 m/s, and with an assumed right atrial pressure of 15 mmHg, the estimated right ventricular systolic pressure is 09.3 mmHg. Left Atrium: Left atrial size was mildly dilated. Right Atrium: Right atrial size was normal in size. Pericardium: There is no evidence of pericardial effusion. Mitral Valve: The mitral valve is grossly normal. Trivial mitral valve regurgitation. No evidence of mitral valve stenosis. Tricuspid Valve: The tricuspid valve is grossly normal. Tricuspid valve  regurgitation is mild . No evidence of tricuspid stenosis. Aortic Valve: The aortic valve is tricuspid. Aortic valve regurgitation is not visualized. No aortic stenosis is present. Pulmonic Valve: The pulmonic valve was grossly normal. Pulmonic valve regurgitation is not visualized. No evidence of pulmonic stenosis. Aorta: The aortic root is normal in size and structure. IAS/Shunts: The interatrial septum appears to be lipomatous. No atrial level shunt detected by color flow Doppler. Additional Comments: LV function is severely reduced, 20-25%. The mid anteroseptum into apex is akinetic, almost aneurysmal, consistent with prior LAD infarction (Q waves on EKG). The myocardium is thinned, futher supporting prior infarction. On contrast  imaging, there is no LV thrombus seen.  LEFT VENTRICLE PLAX 2D LVIDd:         5.80 cm LVIDs:         4.60 cm LV PW:         1.20 cm LV IVS:        1.20 cm LVOT diam:     2.30 cm LV SV:         61 LV SV Index:   26 LVOT Area:     4.15 cm  RIGHT VENTRICLE RV S prime:     11.60 cm/s TAPSE (M-mode): 1.4 cm LEFT ATRIUM           Index       RIGHT ATRIUM           Index LA diam:      5.30 cm 2.25 cm/m  RA Area:     17.10 cm LA Vol (A2C): 62.1 ml 26.39 ml/m RA Volume:   46.60 ml  19.80 ml/m LA Vol (A4C): 82.4 ml 35.01 ml/m  AORTIC VALVE LVOT Vmax:   86.20 cm/s LVOT Vmean:  57.800 cm/s LVOT VTI:    0.146 m  AORTA Ao Root diam: 3.40 cm TRICUSPID VALVE TR Peak grad:   27.2 mmHg TR Vmax:        261.00 cm/s  SHUNTS Systemic VTI:  0.15 m Systemic Diam: 2.30 cm Eleonore Chiquito MD Electronically signed by Eleonore Chiquito MD Signature Date/Time: 11/06/2019/4:33:04 PM    Final  Labs:  CBC: Recent Labs    11/05/19 1822 11/06/19 0312 11/07/19 0006 11/08/19 0439  WBC 8.6 8.3 8.8 7.7  HGB 15.2 14.5 14.9 13.8  HCT 47.0 45.5 47.4 43.7  PLT 162 175 182 PLATELET CLUMPS NOTED ON SMEAR, COUNT APPEARS ADEQUATE    COAGS: No results for input(s): INR, APTT in the last 8760  hours.  BMP: Recent Labs    11/06/19 0312 11/06/19 1849 11/07/19 0006 11/08/19 0439  NA 125* 126* 133* 133*  K 4.2 3.7 3.8 3.4*  CL 82* 77* 82* 82*  CO2 33* 36* 42* 39*  GLUCOSE 134* 129* 123* 156*  BUN _0 CALCIUM 8.3* 8.7* 8.6* 8.6*  CREATININE 0.90 0.86 0.86 0.95  GFRNONAA >60 >60 >60 >60  GFRAA >60 >60 >60 >60    LIVER FUNCTION TESTS: Recent Labs    11/05/19 1721 11/05/19 2053 11/06/19 0312  BILITOT 1.5* 1.6* 1.3*  AST 30 34 21  ALT _1 ALKPHOS 63 58 60  PROT 6.0* 5.6* 5.6*  ALBUMIN 3.8 3.5 3.5    TUMOR MARKERS: No results for input(s): AFPTM, CEA, CA199, CHROMGRNA in the last 8760 hours.  Assessment and Plan: Right MCA bifurcation saccular aneurysm Patient with intra-cranial aneurysm incidentally found on MR during work-up for confusion.  His confusion is thought to be due to metabolic derangements related to his new dx of CHF.  Dr. [name] has met with patient and his wife at bedside. He recommends diagnostic angiogram, possible intervention after work-up for heart failure/volume overload is optimized.  Patient currently on amiodarone gtt, lasix with improvement in symptoms.  NIR to follow to establish plans and monitor status.   Thank you for this interesting consult.  I greatly enjoyed meeting Richard Graham and look forward to participating in their care.  A copy of this report was sent to the requesting provider on this date.  Electronically Signed: Docia Barrier, PA 11/08/2019, 4:43 PM   I spent a total of 40 Minutes    in face to face in clinical consultation, greater than 50% of which was counseling/coordinating care for R MCA bifurcation aneurysm.

## 2019-11-09 LAB — RENAL FUNCTION PANEL
Albumin: 3.8 g/dL (ref 3.5–5.0)
Anion gap: 11 (ref 5–15)
BUN: 11 mg/dL (ref 8–23)
CO2: 41 mmol/L — ABNORMAL HIGH (ref 22–32)
Calcium: 8.6 mg/dL — ABNORMAL LOW (ref 8.9–10.3)
Chloride: 80 mmol/L — ABNORMAL LOW (ref 98–111)
Creatinine, Ser: 0.95 mg/dL (ref 0.61–1.24)
GFR calc Af Amer: >60 mL/min (ref >60)
GFR calc non Af Amer: >60 mL/min (ref >60)
Glucose, Bld: 168 mg/dL — ABNORMAL HIGH (ref 70–99)
Phosphorus: 3.4 mg/dL (ref 2.5–4.6)
Potassium: 3.8 mmol/L (ref 3.5–5.1)
Sodium: 132 mmol/L — ABNORMAL LOW (ref 135–145)

## 2019-11-09 LAB — HEPARIN LEVEL (UNFRACTIONATED)
Heparin Unfractionated: 0.54 IU/mL (ref 0.30–0.70)
Heparin Unfractionated: 0.34 IU/mL (ref 0.30–0.70)

## 2019-11-09 LAB — CBC
HCT: 45.9 % (ref 39.0–52.0)
Hemoglobin: 14.3 g/dL (ref 13.0–17.0)
MCH: 29.9 pg (ref 26.0–34.0)
MCHC: 31.2 g/dL (ref 30.0–36.0)
MCV: 96 fL (ref 80.0–100.0)
Platelets: 140 10*3/uL — ABNORMAL LOW (ref 150–400)
RBC: 4.78 MIL/uL (ref 4.22–5.81)
RDW: 14.2 % (ref 11.5–15.5)
WBC: 7.9 10*3/uL (ref 4.0–10.5)
nRBC: 0 % (ref 0.0–0.2)

## 2019-11-09 LAB — MAGNESIUM: Magnesium: 2 mg/dL (ref 1.7–2.4)

## 2019-11-09 MED ORDER — METOPROLOL TARTRATE 25 MG PO TABS
37.5000 mg | ORAL_TABLET | Freq: Two times a day (BID) | ORAL | Status: DC
  Administered 2019-11-09 – 2019-11-10 (×2): 37.5 mg via ORAL
  Filled 2019-11-09 (×3): qty 1

## 2019-11-09 MED ORDER — FUROSEMIDE 10 MG/ML IJ SOLN
80.0000 mg | Freq: Two times a day (BID) | INTRAMUSCULAR | Status: AC
  Administered 2019-11-09 – 2019-11-15 (×12): 80 mg via INTRAVENOUS
  Filled 2019-11-09 (×12): qty 8

## 2019-11-09 NOTE — Progress Notes (Signed)
ANTICOAGULATION CONSULT NOTE - Follow Up Consult  Pharmacy Consult for Heparin Indication: atrial fibrillation  No Known Allergies  Patient Measurements: Height: 6\' 4"  (193 cm) Weight: 271 lb 9.7 oz (123.2 kg) IBW/kg (Calculated) : 86.8 Heparin Dosing Weight: 103.5  Vital Signs: Temp: 98.4 F (36.9 C) (03/25 1656) Temp Source: Oral (03/25 1656) BP: 101/59 (03/25 2021) Pulse Rate: 94 (03/25 2341)  Labs: Recent Labs    11/07/19 0006 11/07/19 1648 11/08/19 0439 11/08/19 1527 11/08/19 2306  HGB 14.9  --  13.8  --  15.0  HCT 47.4  --  43.7  --  48.3  PLT 182  --  PLATELET CLUMPS NOTED ON SMEAR, COUNT APPEARS ADEQUATE  --  161  HEPARINUNFRC 0.35   < > 0.27* 0.29* 0.54  CREATININE 0.86  --  0.95  --  0.95   < > = values in this interval not displayed.    Estimated Creatinine Clearance: 117.1 mL/min (by C-G formula based on SCr of 0.95 mg/dL).   Medical History: Past Medical History:  Diagnosis Date  . CHF (congestive heart failure) (HCC)   . Gout   . New onset atrial fibrillation [facility])     Assessment: Mr Mozley is a 64 yr old male presenting with new-onset atrial fibrillation with CHF. He is not on any anticoagulation PTA. His CHADS2VASC score is 1 (new CHF).  IV heparin was held overnight 3/23-3/24 with large R MCA bifurcation aneurysm (no evidence to suggest rupture). Per discussion with Dr. 11-19-1969 3/24 AM, he would like to resume heparin and defer further recommendations to Cardiology and Neuro IR. Will target lower heparin level goal with aneurysm.  Heparin level ~6 hrs after heparin infusion was increased to 1650 units/hr was 0.29 units/ml, which remains slightly below the goal range for this pt. CBC WNL. Per RN, no issues with IV or bleeding observed.  3/26 AM update:  Heparin level just above goal  No issues per RN  Goal of Therapy:  Heparin level 0.3-0.5 units/ml Monitor platelets by anticoagulation protocol: Yes   Plan:  Dec heparin to 1650  units/hr Check heparin level in 6-8 hours Monitor daily heparin level and CBC Monitor for signs/symptoms of bleeding F/u Cardiology/Neuro IR plans  4/26, PharmD, BCPS Clinical Pharmacist Phone: [phone]

## 2019-11-09 NOTE — Progress Notes (Signed)
NIR consulted by Dr. Nelson Chimes for management of incidental finding of right MCA bifurcation aneurysm, seen on CT head 11/06/2019.  Management options for right MCA bifurcation aneurysm has been discussed with patient and wife. At this time, patient wishes to pursue image-guided diagnostic cerebral arteriogram, followed by possible intervention (at a later date) during this admission if possible. Patient is pending cardiology work-up for new onset HF and atrial fibrillation. Please call IR ([phone]) or page Dr. Corliss Skains [phone]) once patient's cardiology work-up for HF/atrial fibrillation is optimized, so that we can plan for aneurysm work-up.  Please call NIR with questions/concerns.   Waylan Boga Daisie Haft, PA-C 11/09/2019, 10:42 AM

## 2019-11-09 NOTE — Progress Notes (Signed)
Marland Kitchen  PROGRESS NOTE    Richard Graham  YWV:[id number] DOB: 11-May-1956 DOA: 11/05/2019 PCP: Johny Blamer, MD   Brief Narrative:   64 year old with history of prediabetes, obesity, gout admitted for worsening shortness of breath and lower extremity swelling. Found to be in atrial fibrillation, new diagnosis. Chest x-ray showed pulmonary edema. Cardiology team was consulted, started on diuretics, heparin drip and Cardizem drip. Suspected his hyponatremia was secondary to fluid overload.  3/26: He is doing ok this morning. Continuing diuresis. Decreasing lasix doses to BID Contact IR after heart is stable.    Assessment & Plan:   Principal Problem:   Acute CHF (congestive heart failure) (HCC) Active Problems:   Gout   Thrombocytopenia (HCC)   New onset a-fib (HCC)   Morbid obesity (HCC)  Acute congestive heart failure, reduced ejection fraction 20-25%. Ischemic cardiomyopathy     - Echocardiogram showing EF 20-25%, akinetic apex.       - 2 g salt diet low fluid restriction     - on lasix 80 IV TID and kidneys are tolerating; continue for now     - apprecaite cards assistance     - 3/26: Decreasing lasix dosing to BID. He is tolerating. Continue  New onset atrial fibrillation     - continue heparin/amiodarone gtt     - defer long-term plan to cards     - 3/26: currently on metoprolol/heparin/amiodarone gtt, doing ok  Acute metabolic encephalopathy, improved CO2 narcosis; improved.  Right MCA bifurcation aneurysm, 10 mm     - CT of the head is negative for any acute bleed but coincidental finding of large right MCA bifurcation aneurysm, 10 mm.     - Dicussed with Dr Corliss Skains, he will see the patient.  Patient will require angiogram.     - MRI brain and MRA head has been ordered; see below     - appreciate IR assistance, angiography after heart stabilization  Thrombocytopenia     - trend, no evidence of bleed  Hyponatremia     - stable, monitor  DVT prophylaxis:  heparin gtt Code Status: FULL Disposition Plan: Remains inpt for continued IV diuresis and cerebral aneurysm workup.  Consultants:   IR  Cardiology  ROS:  Denies CP, N, V, ab pain . Remainder 10-pt ROS is negative for all not previously mentioned.  Subjective: "Can I get a bath?"  Objective: Vitals:   11/08/19 2341 11/09/19 0000 11/09/19 0339 11/09/19 0400  BP:  104/82  111/78  Pulse: 94 98  (!) 105  Resp:    20  Temp:   98.8 F (37.1 C)   TempSrc:   Oral   SpO2: 100% 100%  99%  Weight:   11.4 kg   Height:        Intake/Output Summary (Last 24 hours) at 11/09/2019 8546 Last data filed at 11/09/2019 0300 Gross per 24 hour  Intake 2664.87 ml  Output 7600 ml  Net -4935.13 ml   Filed Weights   11/07/19 0515 11/08/19 0443 11/09/19 0339  Weight: 118.4 kg 123.2 kg 11.4 kg    Examination:  General: 64 y.o. male resting in bed in NAD Cardiovascular: RRR, +S1, S2, no m/g/r Respiratory: CTABL, no w/r/r, normal WOB GI: BS+, NDNT, no masses noted, no organomegaly noted MSK: No c/c; BLE edema Neuro: alert to name, follows command Psyc: Appropriate interaction and affect, calm/cooperative   Data Reviewed: I have personally reviewed following labs and imaging studies.  CBC: Recent Labs  Lab 11/05/19  1822 11/06/19 0312 11/07/19 0006 11/08/19 0439 11/08/19 2306  WBC 8.6 8.3 8.8 7.7 8.2  NEUTROABS  --   --   --   --  5.3  HGB 15.2 14.5 14.9 13.8 15.0  HCT 47.0 45.5 47.4 43.7 48.3  MCV 93.3 95.2 96.5 95.6 96.2  PLT 162 175 182 PLATELET CLUMPS NOTED ON SMEAR, COUNT APPEARS ADEQUATE 161   Basic Metabolic Panel: Recent Labs  Lab 11/05/19 1822 11/05/19 2053 11/06/19 0312 11/06/19 1849 11/07/19 0006 11/08/19 0439 11/08/19 2306  NA  --    < > 125* 126* 133* 133* 132*  K  --    < > 4.2 3.7 3.8 3.4* 3.8  CL  --    < > 82* 77* 82* 82* 80*  CO2  --    < > 33* 36* 42* 39* 41*  GLUCOSE  --    < > 134* 129* 123* 156* 168*  BUN  --    < > 16 13 13 8 11   CREATININE  0.76   < > 0.90 0.86 0.86 0.95 0.95  CALCIUM  --    < > 8.3* 8.7* 8.6* 8.6* 8.6*  MG 2.1  --   --   --   --  1.9 2.0  PHOS 4.0  --   --   --   --   --  3.4   < > = values in this interval not displayed.   GFR: Estimated Creatinine Clearance: 13.2 mL/min (by C-G formula based on SCr of 0.95 mg/dL). Liver Function Tests: Recent Labs  Lab 11/05/19 1721 11/05/19 2053 11/06/19 0312 11/08/19 2306  AST 30 34 21  --   ALT 23 19 21   --   ALKPHOS 63 58 60  --   BILITOT 1.5* 1.6* 1.3*  --   PROT 6.0* 5.6* 5.6*  --   ALBUMIN 3.8 3.5 3.5 3.8   No results for input(s): LIPASE, AMYLASE in the last 168 hours. No results for input(s): AMMONIA in the last 168 hours. Coagulation Profile: No results for input(s): INR, PROTIME in the last 168 hours. Cardiac Enzymes: No results for input(s): CKTOTAL, CKMB, CKMBINDEX, TROPONINI in the last 168 hours. BNP (last 3 results) No results for input(s): PROBNP in the last 8760 hours. HbA1C: No results for input(s): HGBA1C in the last 72 hours. CBG: Recent Labs  Lab 11/06/19 1737  GLUCAP 127*   Lipid Profile: Recent Labs    11/07/19 0500  CHOL 103  HDL 40*  LDLCALC 56  TRIG 36  CHOLHDL 2.6   Thyroid Function Tests: No results for input(s): TSH, T4TOTAL, FREET4, T3FREE, THYROIDAB in the last 72 hours. Anemia Panel: No results for input(s): VITAMINB12, FOLATE, FERRITIN, TIBC, IRON, RETICCTPCT in the last 72 hours. Sepsis Labs: No results for input(s): PROCALCITON, LATICACIDVEN in the last 168 hours.  Recent Results (from the past 240 hour(s))  SARS CORONAVIRUS 2 (TAT 6-24 HRS) Nasopharyngeal Nasopharyngeal Swab     Status: None   Collection Time: 11/05/19  6:56 PM   Specimen: Nasopharyngeal Swab  Result Value Ref Range Status   SARS Coronavirus 2 NEGATIVE NEGATIVE Final    Comment: (NOTE) SARS-CoV-2 target nucleic acids are NOT DETECTED. The SARS-CoV-2 RNA is generally detectable in upper and lower respiratory specimens during the acute  phase of infection. Negative results do not preclude SARS-CoV-2 infection, do not rule out co-infections with other pathogens, and should not be used as the sole basis for treatment or other patient management decisions. Negative  results must be combined with clinical observations, patient history, and epidemiological information. The expected result is Negative. Fact Sheet for Patients: HairSlick.no Fact Sheet for Healthcare Providers: quierodirigir.com This test is not yet approved or cleared by the [place] FDA and  has been authorized for detection and/or diagnosis of SARS-CoV-2 by FDA under an Emergency Use Authorization (EUA). This EUA will remain  in effect (meaning this test can be used) for the duration of the COVID-19 declaration under Section 56 4(b)(1) of the Act, 21 U.S.C. section 360bbb-3(b)(1), unless the authorization is terminated or revoked sooner. Performed at [facility], [address] [place], [place] [address]       Radiology Studies: MR ANGIO HEAD WO CONTRAST  Result Date: 11/07/2019 CLINICAL DATA:  64 year old male with initial noncontrast head CT yesterday suggesting a large right MCA bifurcation aneurysm. Found to be in atrial fibrillation this admission with CHF. Improving encephalopathy, likely metabolic. EXAM: MRA HEAD WITHOUT CONTRAST TECHNIQUE: Angiographic images of the Circle of Willis were obtained using MRA technique without intravenous contrast. COMPARISON:  Noncontrast head CT yesterday. FINDINGS: No significant intracranial mass effect.  No ventriculomegaly. Antegrade flow in the posterior circulation with codominant distal vertebral arteries. No distal vertebral stenosis. Patent PICA origins and vertebrobasilar junction. Patent, mildly tortuous basilar artery without stenosis. Patent SCA and PCA origins which are mildly ectatic. Posterior communicating arteries are diminutive or  absent. Bilateral PCA branches are within normal limits. Antegrade flow in the anterior circulation, both ICA siphons. Mild to moderate siphon ectasia, no siphon stenosis. Ophthalmic artery origins are within normal limits, the right is mildly tortuous. Patent and mildly ectatic carotid termini, MCA and ACA origins. The proximal ACAs are also mildly ectatic. No anterior communicating artery aneurysm. Visible ACA branches are tortuous but otherwise negative. Left MCA M1 segment and bifurcation are patent without stenosis. Visible left MCA branches are within normal limits. Right MCA M1 segment is patent without stenosis. There is an 11 mm diameter saccular aneurysm directed inferiorly and slightly laterally from the caudal aspect of the right MCA bifurcation. This has a broad aneurysm neck as seen on series 352, image 9. But beyond the bifurcation the visible right MCA branches are within normal limits. IMPRESSION: 1. Confirmed relatively large 11 mm Saccular Aneurysm of the Right MCA bifurcation. Recommend Neuro-endovascular NIR or Neurosurgery consultation to evaluate the appropriateness of potential treatment. 2. Superimposed generalized intracranial artery ectasia, but no arterial stenosis or branch occlusion identified. Electronically Signed   By: Odessa Fleming M.D.   On: 11/07/2019 16:13   MR BRAIN WO CONTRAST  Result Date: 11/07/2019 CLINICAL DATA:  64 year old male with initial noncontrast head CT yesterday suggesting a large right MCA bifurcation aneurysm. Found to be in atrial fibrillation this admission with CHF. Improving encephalopathy, likely metabolic. EXAM: MRI HEAD WITHOUT CONTRAST TECHNIQUE: Multiplanar, multiecho pulse sequences of the brain and surrounding structures were obtained without intravenous contrast. COMPARISON:  intracranial MRA today reported separately. Head CT yesterday. FINDINGS: Brain: No restricted diffusion to suggest acute infarction. No midline shift, mass effect, evidence of  mass lesion, ventriculomegaly, extra-axial collection or acute intracranial hemorrhage. Cervicomedullary junction and pituitary are within normal limits. Wallace Cullens and white matter signal is within normal limits for age throughout the brain. No cortical encephalomalacia or chronic cerebral blood products identified. Vascular: Major intracranial vascular flow voids are preserved. Abnormal 11 mm saccular flow void at the right MCA bifurcation, see comparison MRA. Skull and upper cervical spine: Normal visible cervical spine. Visualized bone  marrow signal is within normal limits. Sinuses/Orbits: Negative. Other: Mastoids are clear. Visible internal auditory structures appear normal. Scalp and face soft tissues appear negative. IMPRESSION: 1. Right MCA aneurysm, see MRA today reported separately. 2. No acute intracranial abnormality and otherwise normal for age noncontrast MRI appearance of the brain. Electronically Signed   By: Genevie Ann M.D.   On: 11/07/2019 16:19     Scheduled Meds: . furosemide  80 mg Intravenous TID  . metoprolol tartrate  25 mg Oral BID  . potassium chloride  40 mEq Oral BID   Continuous Infusions: . amiodarone 30 mg/hr (11/08/19 1854)  . heparin 1,650 Units/hr (11/09/19 0029)     LOS: 4 days    Time spent: 25 minutes spent in the coordination of care today.    Jonnie Finner, DO [facility]  If 7PM-7AM, please contact night-coverage [email or web address] 11/09/2019, 6:49 AM

## 2019-11-09 NOTE — Plan of Care (Signed)

## 2019-11-09 NOTE — Progress Notes (Signed)
Progress Note  Patient Name: Richard Graham Date of Encounter: 11/09/2019  Primary Cardiologist: Fransico Him, MD   Subjective   Continues to diurese well  Inpatient Medications    Scheduled Meds: . furosemide  80 mg Intravenous BID  . metoprolol tartrate  37.5 mg Oral BID  . potassium chloride  40 mEq Oral BID   Continuous Infusions: . amiodarone 30 mg/hr (11/09/19 0708)  . heparin 1,650 Units/hr (11/09/19 1511)   PRN Meds: acetaminophen **OR** acetaminophen, metoprolol tartrate, ondansetron **OR** ondansetron (ZOFRAN) IV   Vital Signs    Vitals:   11/09/19 0848 11/09/19 0909 11/09/19 1200 11/09/19 1300  BP: 96/84  105/75   Pulse: (!) 115 (!) 116 94   Resp:  18    Temp:      TempSrc:      SpO2:  100% 99%   Weight:    112.5 kg  Height:        Intake/Output Summary (Last 24 hours) at 11/09/2019 1543 Last data filed at 11/09/2019 1300 Gross per 24 hour  Intake 1964.87 ml  Output 6600 ml  Net -4635.13 ml   Last 3 Weights 11/09/2019 11/09/2019 11/08/2019  Weight (lbs) 248 lb 0.3 oz 25 lb 2.1 oz 271 lb 9.7 oz  Weight (kg) 112.5 kg 11.4 kg 123.2 kg      Telemetry    afib rates 90-110 - Personally Reviewed  ECG    No new - Personally Reviewed  Physical Exam   GEN: No acute distress.   Neck: JVD challenging to assess Cardiac: irregular rhythm normal rate, no murmurs, rubs, or gallops.  Respiratory: crackles to mid lung bilaterally. GI: Soft, nontender, non-distended  MS: 3+ pitting edema; No deformity. Neuro:  Nonfocal  Psych: Normal affect   Labs    High Sensitivity Troponin:   Recent Labs  Lab 11/05/19 1731 11/05/19 1822 11/05/19 2053  TROPONINIHS 11 14 12       Chemistry Recent Labs  Lab 11/05/19 1721 11/05/19 1822 11/05/19 2053 11/05/19 2053 11/06/19 0312 11/06/19 1849 11/07/19 0006 11/08/19 0439 11/08/19 2306  NA 122*   < > 124*   < > 125*   < > 133* 133* 132*  K 5.0   < > 4.6   < > 4.2   < > 3.8 3.4* 3.8  CL 81*   < > 87*    < > 82*   < > 82* 82* 80*  CO2 28   < > 27   < > 33*   < > 42* 39* 41*  GLUCOSE 121*   < > 110*   < > 134*   < > 123* 156* 168*  BUN 17   < > 16   < > 16   < > 13 8 11   CREATININE 0.77   < > 0.79   < > 0.90   < > 0.86 0.95 0.95  CALCIUM 8.7*   < > 7.9*   < > 8.3*   < > 8.6* 8.6* 8.6*  PROT 6.0*  --  5.6*  --  5.6*  --   --   --   --   ALBUMIN 3.8   < > 3.5  --  3.5  --   --   --  3.8  AST 30  --  34  --  21  --   --   --   --   ALT 23  --  19  --  21  --   --   --   --  ALKPHOS 63  --  58  --  60  --   --   --   --   BILITOT 1.5*  --  1.6*  --  1.3*  --   --   --   --   GFRNONAA >60   < > >60   < > >60   < > >60 >60 >60  GFRAA >60   < > >60   < > >60   < > >60 >60 >60  ANIONGAP 13   < > 10   < > 10   < > 9 12 11    < > = values in this interval not displayed.     Hematology Recent Labs  Lab 11/08/19 0439 11/08/19 2306 11/09/19 0939  WBC 7.7 8.2 7.9  RBC 4.57 5.02 4.78  HGB 13.8 15.0 14.3  HCT 43.7 48.3 45.9  MCV 95.6 96.2 96.0  MCH 30.2 29.9 29.9  MCHC 31.6 31.1 31.2  RDW 14.2 14.2 14.2  PLT PLATELET CLUMPS NOTED ON SMEAR, COUNT APPEARS ADEQUATE 161 140*    BNP Recent Labs  Lab 11/05/19 1616  BNP 318.7*     DDimer No results for input(s): DDIMER in the last 168 hours.   Radiology    MR ANGIO HEAD WO CONTRAST  Result Date: 11/07/2019 CLINICAL DATA:  64 year old male with initial noncontrast head CT yesterday suggesting a large right MCA bifurcation aneurysm. Found to be in atrial fibrillation this admission with CHF. Improving encephalopathy, likely metabolic. EXAM: MRA HEAD WITHOUT CONTRAST TECHNIQUE: Angiographic images of the Circle of Willis were obtained using MRA technique without intravenous contrast. COMPARISON:  Noncontrast head CT yesterday. FINDINGS: No significant intracranial mass effect.  No ventriculomegaly. Antegrade flow in the posterior circulation with codominant distal vertebral arteries. No distal vertebral stenosis. Patent PICA origins and  vertebrobasilar junction. Patent, mildly tortuous basilar artery without stenosis. Patent SCA and PCA origins which are mildly ectatic. Posterior communicating arteries are diminutive or absent. Bilateral PCA branches are within normal limits. Antegrade flow in the anterior circulation, both ICA siphons. Mild to moderate siphon ectasia, no siphon stenosis. Ophthalmic artery origins are within normal limits, the right is mildly tortuous. Patent and mildly ectatic carotid termini, MCA and ACA origins. The proximal ACAs are also mildly ectatic. No anterior communicating artery aneurysm. Visible ACA branches are tortuous but otherwise negative. Left MCA M1 segment and bifurcation are patent without stenosis. Visible left MCA branches are within normal limits. Right MCA M1 segment is patent without stenosis. There is an 11 mm diameter saccular aneurysm directed inferiorly and slightly laterally from the caudal aspect of the right MCA bifurcation. This has a broad aneurysm neck as seen on series 352, image 9. But beyond the bifurcation the visible right MCA branches are within normal limits. IMPRESSION: 1. Confirmed relatively large 11 mm Saccular Aneurysm of the Right MCA bifurcation. Recommend Neuro-endovascular NIR or Neurosurgery consultation to evaluate the appropriateness of potential treatment. 2. Superimposed generalized intracranial artery ectasia, but no arterial stenosis or branch occlusion identified. Electronically Signed   By: 77 M.D.   On: 11/07/2019 16:13   MR BRAIN WO CONTRAST  Result Date: 11/07/2019 CLINICAL DATA:  64 year old male with initial noncontrast head CT yesterday suggesting a large right MCA bifurcation aneurysm. Found to be in atrial fibrillation this admission with CHF. Improving encephalopathy, likely metabolic. EXAM: MRI HEAD WITHOUT CONTRAST TECHNIQUE: Multiplanar, multiecho pulse sequences of the brain and surrounding structures were obtained without intravenous contrast.  COMPARISON:  intracranial MRA today reported separately. Head CT yesterday. FINDINGS: Brain: No restricted diffusion to suggest acute infarction. No midline shift, mass effect, evidence of mass lesion, ventriculomegaly, extra-axial collection or acute intracranial hemorrhage. Cervicomedullary junction and pituitary are within normal limits. Wallace Cullens and white matter signal is within normal limits for age throughout the brain. No cortical encephalomalacia or chronic cerebral blood products identified. Vascular: Major intracranial vascular flow voids are preserved. Abnormal 11 mm saccular flow void at the right MCA bifurcation, see comparison MRA. Skull and upper cervical spine: Normal visible cervical spine. Visualized bone marrow signal is within normal limits. Sinuses/Orbits: Negative. Other: Mastoids are clear. Visible internal auditory structures appear normal. Scalp and face soft tissues appear negative. IMPRESSION: 1. Right MCA aneurysm, see MRA today reported separately. 2. No acute intracranial abnormality and otherwise normal for age noncontrast MRI appearance of the brain. Electronically Signed   By: Odessa Fleming M.D.   On: 11/07/2019 16:19    Cardiac Studies   Patient Profile     Assessment & Plan    1. New onset atrial fibrillation with RVR -Presented with SOB and weight gain (40-50lb) for the last 4 weeks however patient suspects he has been in AF for a while  - IV amiodarone  -CHADS2VASC score is 1 (new CHF) -IV heparin gtt for now. Patient has large intracranial aneurysm that will require management.   2. New onset CHF -Reports a 40-50lb weight gain in last 3-4 weeks -CXR with interstitial edema on presentation  -BNP 318  -Presumably related to afib with RVR -lasix 80 mg Tid IV, continued gross volume overload.  -Weight, 247lb on admission   - 15 L for admission. -Creatinine stable  3. Obesity -BMI, 32.59       For questions or updates, please contact [facility] Please consult [email or web address] for contact info under        Signed, Parke Poisson, MD

## 2019-11-09 NOTE — Progress Notes (Signed)
ANTICOAGULATION CONSULT NOTE - Follow Up Consult  Pharmacy Consult for Heparin Indication: atrial fibrillation  No Known Allergies  Patient Measurements: Height: 6\' 4"  (193 cm) Weight: 248 lb 0.3 oz (112.5 kg)(bed weight/ reweighed ) IBW/kg (Calculated) : 86.8 Heparin Dosing Weight: 103.5  Vital Signs: Temp: 98.3 F (36.8 C) (03/26 0836) Temp Source: Oral (03/26 0836) BP: 105/75 (03/26 1200) Pulse Rate: 94 (03/26 1200)  Labs: Recent Labs    11/07/19 0006 11/07/19 1648 11/08/19 0439 11/08/19 0439 11/08/19 1527 11/08/19 2306 11/09/19 0939  HGB 14.9  --  13.8   < >  --  15.0 14.3  HCT 47.4  --  43.7  --   --  48.3 45.9  PLT 182  --  PLATELET CLUMPS NOTED ON SMEAR, COUNT APPEARS ADEQUATE  --   --  161 140*  HEPARINUNFRC 0.35   < > 0.27*   < > 0.29* 0.54 0.34  CREATININE 0.86  --  0.95  --   --  0.95  --    < > = values in this interval not displayed.    Estimated Creatinine Clearance: 112.1 mL/min (by C-G formula based on SCr of 0.95 mg/dL).   Medical History: Past Medical History:  Diagnosis Date  . CHF (congestive heart failure) (HCC)   . Gout   . New onset atrial fibrillation [facility])     Assessment: Mr Boze is a 64 yr old male presenting with new-onset atrial fibrillation with CHF. He is not on any anticoagulation PTA. His CHADS2VASC score is 1 (new CHF).  IV heparin was held overnight 3/23-3/24 with large R MCA bifurcation aneurysm (no evidence to suggest rupture). Per discussion with Dr. 11-19-1969 3/24 AM, he would like to resume heparin and defer further recommendations to Cardiology and Neuro IR. Will target lower heparin level goal with aneurysm.  Heparin level is therapeutic on 1650 units/hr. CBC WNL. Per RN, no issues with IV or bleeding observed.  Goal of Therapy:  Heparin level 0.3-0.5 units/ml Monitor platelets by anticoagulation protocol: Yes   Plan:  Continue heparin at 1650 units/hr Monitor daily heparin level and CBC Monitor for signs/symptoms  of bleeding F/u Cardiology/Neuro IR plans  4/24, Pharm.D., BCPS Clinical Pharmacist Clinical phone for 11/09/2019 from 7:30-3:00 is [phone].  **Pharmacist phone directory can be found on [email or web address] listed under [facility].  11/09/2019 2:14 PM\
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
## 2019-11-09 NOTE — Progress Notes (Signed)
  ReDS Clip Diuretic Study Pt study # [id number]  Your patient has been enrolled in the ReDS Clip Diuretic Study   Changes to prescribed diuretics recommended:  Good uop over last few days but still volume overloaded - would recommend continue furosemide back to 80mg  IV bid  Provider contacted:  Recommendation     REDS Clip  READING= 48  CHEST RULER = 38 Clip Station = D   Orthodema score =  Signs/Symptoms Score   Mild edema, no orthopnea 0 No congestion  Moderate edema, no orthopnea 1 Low-grade orthodema/congestion  Severe edema OR orthopnea 2   Moderate edema and orthopnea 3 High-grade orthodema/congestion  Severe edema AND orthopnea 4      Jacques Navy Pharm.D. CPP, BCPS Clinical Pharmacist [phone] 11/09/2019 8:01 AM

## 2019-11-10 DIAGNOSIS — M1A9XX0 Chronic gout, unspecified, without tophus (tophi): Secondary | ICD-10-CM

## 2019-11-10 LAB — RENAL FUNCTION PANEL
Albumin: 3.6 g/dL (ref 3.5–5.0)
Anion gap: 14 (ref 5–15)
BUN: 13 mg/dL (ref 8–23)
CO2: 34 mmol/L — ABNORMAL HIGH (ref 22–32)
Calcium: 9.1 mg/dL (ref 8.9–10.3)
Chloride: 85 mmol/L — ABNORMAL LOW (ref 98–111)
Creatinine, Ser: 0.94 mg/dL (ref 0.61–1.24)
GFR calc Af Amer: >60 mL/min (ref >60)
GFR calc non Af Amer: >60 mL/min (ref >60)
Glucose, Bld: 117 mg/dL — ABNORMAL HIGH (ref 70–99)
Phosphorus: 3.8 mg/dL (ref 2.5–4.6)
Potassium: 4.8 mmol/L (ref 3.5–5.1)
Sodium: 133 mmol/L — ABNORMAL LOW (ref 135–145)

## 2019-11-10 LAB — HEPARIN LEVEL (UNFRACTIONATED): Heparin Unfractionated: 0.39 IU/mL (ref 0.30–0.70)

## 2019-11-10 LAB — MAGNESIUM: Magnesium: 2.1 mg/dL (ref 1.7–2.4)

## 2019-11-10 MED ORDER — AMIODARONE HCL 200 MG PO TABS
200.0000 mg | ORAL_TABLET | Freq: Two times a day (BID) | ORAL | Status: DC
  Administered 2019-11-10 – 2019-11-16 (×13): 200 mg via ORAL
  Filled 2019-11-10 (×14): qty 1

## 2019-11-10 MED ORDER — POTASSIUM CHLORIDE CRYS ER 20 MEQ PO TBCR
40.0000 meq | EXTENDED_RELEASE_TABLET | Freq: Every day | ORAL | Status: DC
  Administered 2019-11-11 – 2019-11-16 (×6): 40 meq via ORAL
  Filled 2019-11-10 (×6): qty 2

## 2019-11-10 MED ORDER — METOPROLOL TARTRATE 50 MG PO TABS
50.0000 mg | ORAL_TABLET | Freq: Two times a day (BID) | ORAL | Status: DC
  Administered 2019-11-10 – 2019-11-16 (×11): 50 mg via ORAL
  Filled 2019-11-10 (×12): qty 1

## 2019-11-10 NOTE — Progress Notes (Signed)
entered pts room to see amiodarone was not running pt "turned it off" due to alarming  amnio restarted  IV screen lock placed  Pt educated  Will continue to monitor

## 2019-11-10 NOTE — Progress Notes (Signed)
ANTICOAGULATION CONSULT NOTE - Follow Up Consult  Pharmacy Consult for Heparin Indication: atrial fibrillation  No Known Allergies  Patient Measurements: Height: 6\' 4"  (193 cm) Weight: 245 lb 6 oz (111.3 kg) IBW/kg (Calculated) : 86.8 Heparin Dosing Weight: 103.5  Vital Signs: Temp: 98.1 F (36.7 C) (03/27 0321) Temp Source: Oral (03/27 0321) BP: 97/79 (03/27 0318) Pulse Rate: 100 (03/27 0322)  Labs: Recent Labs    11/08/19 0439 11/08/19 0439 11/08/19 1527 11/08/19 2306 11/09/19 0939 11/10/19 0505  HGB 13.8   < >  --  15.0 14.3  --   HCT 43.7  --   --  48.3 45.9  --   PLT PLATELET CLUMPS NOTED ON SMEAR, COUNT APPEARS ADEQUATE  --   --  161 140*  --   HEPARINUNFRC 0.27*   < > 0.29* 0.54 0.34  --   CREATININE 0.95  --   --  0.95  --  0.94   < > = values in this interval not displayed.    Estimated Creatinine Clearance: 112.8 mL/min (by C-G formula based on SCr of 0.94 mg/dL).   Medical History: Past Medical History:  Diagnosis Date  . CHF (congestive heart failure) (HCC)   . Gout   . [facility]     Assessment: Mr Muska is a 64 yr old male presenting with new-onset atrial fibrillation with CHF. He is not on any anticoagulation PTA. His CHADS2VASC score is 1 (new CHF).  IV heparin was held overnight 3/23-3/24 with large R MCA bifurcation aneurysm (no evidence to suggest rupture). Per discussion with Dr. 11-19-1969 3/24 AM, he would like to resume heparin and defer further recommendations to Cardiology and Neuro IR. Will target lower heparin level goal with aneurysm.  Heparin level is therapeutic at 0.39 on 1650 units/hour. No CBC today but last H&H from 3/26 is stable at 14.3/45.9, plts had dropped slightly below normal limit at 140. No bleeding noted.   Goal of Therapy:  Heparin level 0.3-0.5 units/ml Monitor platelets by anticoagulation protocol: Yes   Plan:  Continue heparin at 1650 units/hr Monitor daily heparin level and  CBC Monitor for signs/symptoms of bleeding F/u Cardiology/Neuro IR plans   Thank you,   03-01-2002, PharmD PGY-1 Pharmacy Resident   Please check amion for clinical pharmacist contact number

## 2019-11-10 NOTE — Progress Notes (Signed)
Richard Graham  PROGRESS NOTE    [name]  RSW:[id number] DOB: Dec 15, 1955 DOA: 11/05/2019 PCP: [name], MD   Brief Narrative:   64 year old with history of prediabetes, obesity, gout admitted for worsening shortness of breath and lower extremity swelling. Found to be in atrial fibrillation, new diagnosis. Chest x-ray showed pulmonary edema. Cardiology team was consulted, started on diuretics, heparin drip and Cardizem drip. Suspected his hyponatremia was secondary to fluid overload.  3/26: He is doing ok this morning. Continuing diuresis. Decreasing lasix doses to BID Contact IR after heart is stable.   3/27: Continuing diuresis. ReDS diuretic study reveals 47% of fluid around right lung, improved but remains elevated.    Assessment & Plan:   Principal Problem:   Acute CHF (congestive heart failure) (HCC) Active Problems:   Gout   Thrombocytopenia (HCC)   New onset a-fib (HCC)   Morbid obesity (HCC)  Acute congestive heart failure, reduced ejection fraction 20-25%. Ischemic cardiomyopathy     - Echocardiogram showing EF 20-25%, akinetic apex.       - 62 g salt diet low fluid restriction     - on lasix 80 IV TID and kidneys are tolerating; continue for now     - apprecaite cards assistance     - Continue lasix dose at 80 mg BID     - Decrease potassium repletion to 40 mg daily as potassium is increased to 4.8 this morning  New onset atrial fibrillation     - continue heparin/amiodarone gtt     - defer long-term plan to cards     - 3/26: currently on metoprolol/heparin/amiodarone gtt, doing ok  Acute metabolic encephalopathy, improved CO2 narcosis; improved.  Right MCA bifurcation aneurysm, 10 mm     - CT of the head is negative for any acute bleed but coincidental finding of large right MCA bifurcation aneurysm, 10 mm.     - Dicussed with Dr [name], he will see the patient.  Patient will require angiogram.     - MRI brain and MRA head has been ordered; see  below     - appreciate IR assistance, angiography after heart stabilization  Thrombocytopenia     - trend, no evidence of bleed  Hyponatremia     - stable, monitor  Hypokalemia     - Resolved.     - Will need continued repletion given high dose lasix however will decrease to 40 mg daily as last potassium was 4.8.   DVT prophylaxis: heparin gtt Code Status: FULL Disposition Plan: Remains inpt for continued IV diuresis and cerebral aneurysm workup.  Consultants:   IR  Cardiology  ROS:  Denies CP, N, V, ab pain . Remainder 10-pt ROS is negative for all not previously mentioned.  Subjective: Patient reports much improvement in his lower extremities. He is asking for his legs to be elevated. He admits to continued dyspnea on exertion although says this is improved.   Objective: Vitals:   11/10/19 0322 11/10/19 0323 11/10/19 0836 11/10/19 0859  BP:    103/74  Pulse: 100     Resp:   20   Temp:      TempSrc:      SpO2: 99%     Weight:  111.3 kg    Height:        Intake/Output Summary (Last 24 hours) at 11/10/2019 1414 Last data filed at 11/10/2019 0508 Gross per 24 hour  Intake 1289.66 ml  Output 2800 ml  Net -1510.34 ml  Filed Weights   11/09/19 0339 11/09/19 1300 11/10/19 0323  Weight: 11.4 kg 112.5 kg 111.3 kg    Examination:  64 y.o. male resting in bed in NAD Cardiovascular: RRR, +S1, S2, no m/g/r Respiratory: CTABL, no w/r/r, normal WOB GI: BS+, NDNT, no masses noted, no organomegaly noted MSK: No c/c; 1-2+ BLE edema Neuro: alert and oriented, follows command Psyc: Appropriate interaction and affect, calm/cooperative   Data Reviewed: I have personally reviewed following labs and imaging studies.  CBC: Recent Labs  Lab 11/06/19 0312 11/07/19 0006 11/08/19 0439 11/08/19 2306 11/09/19 0939  WBC 8.3 8.8 7.7 8.2 7.9  NEUTROABS  --   --   --  5.3  --   HGB 14.5 14.9 13.8 15.0 14.3  HCT 45.5 47.4 43.7 48.3 45.9  MCV 95.2 96.5 95.6 96.2  96.0  PLT 175 182 PLATELET CLUMPS NOTED ON SMEAR, COUNT APPEARS ADEQUATE 161 140*   Basic Metabolic Panel: Recent Labs  Lab 11/05/19 1822 11/05/19 2053 11/06/19 1849 11/07/19 0006 11/08/19 0439 11/08/19 2306 11/10/19 0505  NA  --    < > 126* 133* 133* 132* 133*  K  --    < > 3.7 3.8 3.4* 3.8 4.8  CL  --    < > 77* 82* 82* 80* 85*  CO2  --    < > 36* 42* 39* 41* 34*  GLUCOSE  --    < > 129* 123* 156* 168* 117*  BUN  --    < > 13 13 8 11 13   CREATININE 0.76   < > 0.86 0.86 0.95 0.95 0.94  CALCIUM  --    < > 8.7* 8.6* 8.6* 8.6* 9.1  MG 2.1  --   --   --  1.9 2.0 2.1  PHOS 4.0  --   --   --   --  3.4 3.8   < > = values in this interval not displayed.   GFR: Estimated Creatinine Clearance: 112.8 mL/min (by C-G formula based on SCr of 0.94 mg/dL). Liver Function Tests: Recent Labs  Lab 11/05/19 1721 11/05/19 2053 11/06/19 0312 11/08/19 2306 11/10/19 0505  AST 30 34 21  --   --   ALT 23 19 21   --   --   ALKPHOS 63 58 60  --   --   BILITOT 1.5* 1.6* 1.3*  --   --   PROT 6.0* 5.6* 5.6*  --   --   ALBUMIN 3.8 3.5 3.5 3.8 3.6   No results for input(s): LIPASE, AMYLASE in the last 168 hours. No results for input(s): AMMONIA in the last 168 hours. Coagulation Profile: No results for input(s): INR, PROTIME in the last 168 hours. Cardiac Enzymes: No results for input(s): CKTOTAL, CKMB, CKMBINDEX, TROPONINI in the last 168 hours. BNP (last 3 results) No results for input(s): PROBNP in the last 8760 hours. HbA1C: No results for input(s): HGBA1C in the last 72 hours. CBG: Recent Labs  Lab 11/06/19 1737  GLUCAP 127*   Lipid Profile: No results for input(s): CHOL, HDL, LDLCALC, TRIG, CHOLHDL, LDLDIRECT in the last 72 hours. Thyroid Function Tests: No results for input(s): TSH, T4TOTAL, FREET4, T3FREE, THYROIDAB in the last 72 hours. Anemia Panel: No results for input(s): VITAMINB12, FOLATE, FERRITIN, TIBC, IRON, RETICCTPCT in the last 72 hours. Sepsis Labs: No results for  input(s): PROCALCITON, LATICACIDVEN in the last 168 hours.  Recent Results (from the past 240 hour(s))  SARS CORONAVIRUS 2 (TAT 6-24 HRS) Nasopharyngeal Nasopharyngeal  Swab     Status: None   Collection Time: 11/05/19  6:56 PM   Specimen: Nasopharyngeal Swab  Result Value Ref Range Status   SARS Coronavirus 2 NEGATIVE NEGATIVE Final    Comment: (NOTE) SARS-CoV-2 target nucleic acids are NOT DETECTED. The SARS-CoV-2 RNA is generally detectable in upper and lower respiratory specimens during the acute phase of infection. Negative results do not preclude SARS-CoV-2 infection, do not rule out co-infections with other pathogens, and should not be used as the sole basis for treatment or other patient management decisions. Negative results must be combined with clinical observations, patient history, and epidemiological information. The expected result is Negative. Fact Sheet for Patients: HairSlick.no Fact Sheet for Healthcare Providers: quierodirigir.com This test is not yet approved or cleared by the [place] FDA and  has been authorized for detection and/or diagnosis of SARS-CoV-2 by FDA under an Emergency Use Authorization (EUA). This EUA will remain  in effect (meaning this test can be used) for the duration of the COVID-19 declaration under Section 56 4(b)(1) of the Act, 21 U.S.C. section 360bbb-3(b)(1), unless the authorization is terminated or revoked sooner. Performed at [facility], [address], [place], [place] [address]       Radiology Studies: No results found.   Scheduled Meds: . amiodarone  200 mg Oral BID  . furosemide  80 mg Intravenous BID  . metoprolol tartrate  50 mg Oral BID  . potassium chloride  40 mEq Oral BID   Continuous Infusions: . heparin 1,650 Units/hr (11/10/19 0507)     LOS: 5 days   Pollyann Savoy, DO [facility]  If 7PM-7AM, please contact  night-coverage [email or web address] 11/10/2019, 2:14 PM

## 2019-11-10 NOTE — Progress Notes (Signed)
  ReDS Clip Diuretic Study Pt study # [id number]  Your patient has been enrolled in the ReDS Clip Diuretic Study   Changes to prescribed diuretics recommended:  Good uop over last few days but still volume overloaded - would continue furosemide 80mg  IV bid and wrap legs with TED hose  Provider contacted: Dr.  REDS Clip  READING= 47%  CHEST RULER = 38 Clip Station = D   Orthodema score = 1 Signs/Symptoms Score   Mild edema, no orthopnea 0 No congestion  Moderate edema, no orthopnea 1 Low-grade orthodema/congestion  Severe edema OR orthopnea 2   Moderate edema and orthopnea 3 High-grade orthodema/congestion  Severe edema AND orthopnea 4    Alvester Morin, PharmD, [facility] PGY2 Cardiology Pharmacy Resident Phone [phone] 11/10/2019       10:01 AM  Please check [email or web address] for unit-specific pharmacist phone numbers

## 2019-11-10 NOTE — Progress Notes (Signed)
Progress Note  Patient Name: Richard Graham Date of Encounter: 11/10/2019  Primary Cardiologist: Armanda Magic, MD   Subjective   Reports dyspnea improving.  Stable BP, 103/74 this morning.  Net -2.4 L yesterday, 18 L on admission, on IV Lasix 80 mg twice daily.  Renal function stable (creatinine 0.9).  Weight down 3 pounds from yesterday.  Inpatient Medications    Scheduled Meds: . furosemide  80 mg Intravenous BID  . metoprolol tartrate  37.5 mg Oral BID  . potassium chloride  40 mEq Oral BID   Continuous Infusions: . amiodarone 30 mg/hr (11/10/19 0508)  . heparin 1,650 Units/hr (11/10/19 0507)   PRN Meds: acetaminophen **OR** acetaminophen, metoprolol tartrate, ondansetron **OR** ondansetron (ZOFRAN) IV   Vital Signs    Vitals:   11/10/19 0322 11/10/19 0323 11/10/19 0836 11/10/19 0859  BP:    103/74  Pulse: 100     Resp:   20   Temp:      TempSrc:      SpO2: 99%     Weight:  111.3 kg    Height:        Intake/Output Summary (Last 24 hours) at 11/10/2019 1042 Last data filed at 11/10/2019 0508 Gross per 24 hour  Intake 1489.66 ml  Output 4100 ml  Net -2610.34 ml   Last 3 Weights 11/10/2019 11/09/2019 11/09/2019  Weight (lbs) 245 lb 6 oz 248 lb 0.3 oz 25 lb 2.1 oz  Weight (kg) 111.3 kg 112.5 kg 11.4 kg      Telemetry    Atrial fibrillation with rates in the 80's to 110's. - Personally Reviewed  ECG    No new ECG tracing today. - Personally Reviewed  Physical Exam   GEN: No acute distress.   Neck: Supple. Difficult to assess JVD due to body habitus.  Cardiac: Irregularly irregular rhythm and borderline tachycardic. No murmurs, rubs, or gallops. Radial pulses 2+ and equal bilaterally. Respiratory: No increased work of breathing. Breath sounds seem slightly demininished in the right base.  GI: Soft, non-distended, and non-tender.  MS: 1+ lower extremity edema but patient states it is improving. No deformity. Skin: Warm and dry. Neuro: Alert and  oriented x3. No focal deficits. Psych: Normal affect. Responds appropriately.  Labs    High Sensitivity Troponin:   Recent Labs  Lab 11/05/19 1731 11/05/19 1822 11/05/19 2053  TROPONINIHS 11 14 12       Chemistry Recent Labs  Lab 11/05/19 1721 11/05/19 1822 11/05/19 2053 11/05/19 2053 11/06/19 0312 11/06/19 1849 11/08/19 0439 11/08/19 2306 11/10/19 0505  NA 122*   < > 124*   < > 125*   < > 133* 132* 133*  K 5.0   < > 4.6   < > 4.2   < > 3.4* 3.8 4.8  CL 81*   < > 87*   < > 82*   < > 82* 80* 85*  CO2 28   < > 27   < > 33*   < > 39* 41* 34*  GLUCOSE 121*   < > 110*   < > 134*   < > 156* 168* 117*  BUN 17   < > 16   < > 16   < > 8 11 13   CREATININE 0.77   < > 0.79   < > 0.90   < > 0.95 0.95 0.94  CALCIUM 8.7*   < > 7.9*   < > 8.3*   < > 8.6* 8.6* 9.1  PROT  6.0*  --  5.6*  --  5.6*  --   --   --   --   ALBUMIN 3.8   < > 3.5   < > 3.5  --   --  3.8 3.6  AST 30  --  34  --  21  --   --   --   --   ALT 23  --  19  --  21  --   --   --   --   ALKPHOS 63  --  58  --  60  --   --   --   --   BILITOT 1.5*  --  1.6*  --  1.3*  --   --   --   --   GFRNONAA >60   < > >60   < > >60   < > >60 >60 >60  GFRAA >60   < > >60   < > >60   < > >60 >60 >60  ANIONGAP 13   < > 10   < > 10   < > 12 11 14    < > = values in this interval not displayed.     Hematology Recent Labs  Lab 11/08/19 0439 11/08/19 2306 11/09/19 0939  WBC 7.7 8.2 7.9  RBC 4.57 5.02 4.78  HGB 13.8 15.0 14.3  HCT 43.7 48.3 45.9  MCV 95.6 96.2 96.0  MCH 30.2 29.9 29.9  MCHC 31.6 31.1 31.2  RDW 14.2 14.2 14.2  PLT PLATELET CLUMPS NOTED ON SMEAR, COUNT APPEARS ADEQUATE 161 140*    BNP Recent Labs  Lab 11/05/19 1616  BNP 318.7*     DDimer No results for input(s): DDIMER in the last 168 hours.   Radiology    No results found.  Cardiac Studies   Echocardiogram 11/06/2019: Impressions: 1. LV function is severely reduced, 20-25%. The mid anteroseptum into  apex is akinetic, almost aneurysmal,  consistent with prior LAD infarction  (Q waves on EKG). The myocardium is thinned, futher supporting prior  infarction. On contrast imaging, there  is no LV thrombus seen.  2. Left ventricular ejection fraction, by estimation, is 20 to 25%. The  left ventricle has severely decreased function. The left ventricle  demonstrates regional wall motion abnormalities (see scoring  diagram/findings for description). There is mild  concentric left ventricular hypertrophy. Left ventricular diastolic  function could not be evaluated.  3. Right ventricular systolic function is mildly reduced. The right  ventricular size is normal. There is mildly elevated pulmonary artery  systolic pressure. The estimated right ventricular systolic pressure is  42.2 mmHg.  4. Left atrial size was mildly dilated.  5. The mitral valve is grossly normal. Trivial mitral valve  regurgitation. No evidence of mitral stenosis.  6. The aortic valve is tricuspid. Aortic valve regurgitation is not  visualized. No aortic stenosis is present.   Conclusion(s)/Recommendation(s): Findings consistent with ischemic  cardiomyopathy.  Patient Profile   Richard Graham is a 64 y.o. male with a history of pre-diabetes and obesity who is being seen for evaluation of new onset atrial fibrillation and CHF at the request of Dr. 77.   Assessment & Plan    New Onset Atrial Fibrillation - Presented with shortness of breath and 40-50lb weight gain over the last 4 weeks; however, patient suspects he has been in atrial fibrillation for a while.  - TSH normal. - Potassium low at times. Goal >4.0. Supplement as needed. - Magnesium Goal >2.0.  Supplement as needed.  - Echo showed LVEF of 20-25%. Mid anteroseptum into apex is akinetic, almost aneurysmal, consistent with prior LAD infarction.  - Initially started on IV Cardizem but this was stopped on 3/24 and he was started on IV Amiodarone instead. Rates mostly well controlled in the 80's  to low 100's. - Continue Lopressor. Will  increase to 50mg  twice daily and add parameter given soft BP. - CHA2DS2-VASC = 1 (new CHF). Currently on IV Heparin. Patient does have a large intracranial aneurysm that will require management.  -He is currently on IV amiodarone drip.  Would favor discontinuing IV amiodarone given risk of chemical cardioversion until LAA thrombus has been ruled out.  Will switch to p.o. amiodarone 200 mg twice daily, as low risk of chemical cardioversion on p.o. amiodarone in short-term -Plan for likely TEE/DCCV following LHC/RHC (the timing of cardioversion will have to be discussed with IR, as we will need to decide on plan for MCA aneurysm intervention before committing to uninterrupted anticoagulation following cardioversion)  New Onset Acute Systolic CHF - BNP 893 on admission which may be underestimated due to obesity.  - Chest x-ray with interstitial edema. - LVEF of 20-25%.  - Continue IV Lasix 80 mg twice daily - Continue Lopressor 50mg  twice daily. Consider consolidating to Toprol-XL prior to discharge. - No ACEi/ARB/ARNI at this time due to soft BP. Could start Entresto if BP allows (maybe as outpatient). - Continue to monitor daily weights, strict I/O's, and renal function. - He is able to lie flat without any dyspnea, will plan for LHC/RHC on Monday  R MCA aneurysm: seen by IR, intervention planned after cardiac optimization.  Pre-Diabetes - Hemoglobin A1c 6.0.  - Follow-up with PCP.  Obesity  - BMI 32.59.   For questions or updates, please contact [place] Please consult [email or web address] for contact info under      Signed, Donato Heinz, MD  11/10/2019, 10:41 AM

## 2019-11-10 NOTE — Procedures (Signed)
Patient declined to wear Bipap tonight.  Sats of 93% on 3lpm .

## 2019-11-11 LAB — HEPARIN LEVEL (UNFRACTIONATED)
Heparin Unfractionated: 0.27 IU/mL — ABNORMAL LOW (ref 0.30–0.70)
Heparin Unfractionated: 0.31 IU/mL (ref 0.30–0.70)

## 2019-11-11 LAB — CBC
HCT: 43.7 % (ref 39.0–52.0)
Hemoglobin: 13.7 g/dL (ref 13.0–17.0)
MCH: 29.9 pg (ref 26.0–34.0)
MCHC: 31.4 g/dL (ref 30.0–36.0)
MCV: 95.4 fL (ref 80.0–100.0)
Platelets: 131 10*3/uL — ABNORMAL LOW (ref 150–400)
RBC: 4.58 MIL/uL (ref 4.22–5.81)
RDW: 14.2 % (ref 11.5–15.5)
WBC: 6.6 10*3/uL (ref 4.0–10.5)
nRBC: 0 % (ref 0.0–0.2)

## 2019-11-11 LAB — MAGNESIUM: Magnesium: 2 mg/dL (ref 1.7–2.4)

## 2019-11-11 LAB — BASIC METABOLIC PANEL
Anion gap: 13 (ref 5–15)
BUN: 14 mg/dL (ref 8–23)
CO2: 31 mmol/L (ref 22–32)
Calcium: 9.1 mg/dL (ref 8.9–10.3)
Chloride: 88 mmol/L — ABNORMAL LOW (ref 98–111)
Creatinine, Ser: 0.91 mg/dL (ref 0.61–1.24)
GFR calc Af Amer: >60 mL/min (ref >60)
GFR calc non Af Amer: >60 mL/min (ref >60)
Glucose, Bld: 100 mg/dL — ABNORMAL HIGH (ref 70–99)
Potassium: 4.2 mmol/L (ref 3.5–5.1)
Sodium: 132 mmol/L — ABNORMAL LOW (ref 135–145)

## 2019-11-11 LAB — GLUCOSE, CAPILLARY
Glucose-Capillary: 155 mg/dL — ABNORMAL HIGH (ref 70–99)
Glucose-Capillary: 123 mg/dL — ABNORMAL HIGH (ref 70–99)

## 2019-11-11 MED ORDER — SODIUM CHLORIDE 0.9% FLUSH
3.0000 mL | Freq: Two times a day (BID) | INTRAVENOUS | Status: DC
  Administered 2019-11-11 – 2019-11-15 (×9): 3 mL via INTRAVENOUS

## 2019-11-11 MED ORDER — SODIUM CHLORIDE 0.9% FLUSH: 3.0000 mL | INTRAVENOUS | Status: DC | PRN

## 2019-11-11 MED ORDER — SODIUM CHLORIDE 0.9 % IV SOLN: INTRAVENOUS | Status: DC

## 2019-11-11 MED ORDER — ASPIRIN 81 MG PO CHEW
81.0000 mg | CHEWABLE_TABLET | ORAL | Status: AC
  Administered 2019-11-12: 81 mg via ORAL
  Filled 2019-11-11: qty 1

## 2019-11-11 MED ORDER — SODIUM CHLORIDE 0.9 % IV SOLN: 250.0000 mL | INTRAVENOUS | Status: DC | PRN

## 2019-11-11 MED ORDER — METOLAZONE 5 MG PO TABS
5.0000 mg | ORAL_TABLET | Freq: Once | ORAL | Status: AC
  Administered 2019-11-11: 11:00:00 5 mg via ORAL
  Filled 2019-11-11: qty 1

## 2019-11-11 NOTE — Progress Notes (Signed)
ANTICOAGULATION CONSULT NOTE - Follow Up Consult  Pharmacy Consult for Heparin Indication: atrial fibrillation  No Known Allergies  Patient Measurements: Height: 6\' 4"  (193 cm) Weight: 244 lb 0.8 oz (110.7 kg) IBW/kg (Calculated) : 86.8 Heparin Dosing Weight: 103.5  Vital Signs: Temp: 97.7 F (36.5 C) (03/28 0827) Temp Source: Oral (03/28 0827) BP: 101/77 (03/28 0827) Pulse Rate: 87 (03/28 0827)  Labs: Recent Labs    11/08/19 2306 11/08/19 2306 11/09/19 0939 11/10/19 0505 11/10/19 0913 11/11/19 0742  HGB 15.0   < > 14.3  --   --  13.7  HCT 48.3  --  45.9  --   --  43.7  PLT 161  --  140*  --   --  131*  HEPARINUNFRC 0.54   < > 0.34  --  0.39 0.27*  CREATININE 0.95  --   --  0.94  --  0.91   < > = values in this interval not displayed.    Estimated Creatinine Clearance: 116.2 mL/min (by C-G formula based on SCr of 0.91 mg/dL).   Medical History: Past Medical History:  Diagnosis Date  . CHF (congestive heart failure) (HCC)   . Gout   . New onset atrial fibrillation [facility])     Assessment: Richard Graham is a 64 yr old male presenting with new-onset atrial fibrillation with CHF. He is not on any anticoagulation PTA. His CHADS2VASC score is 1 (new CHF).  IV heparin was held overnight 3/23-3/24 with large R MCA bifurcation aneurysm (no evidence to suggest rupture). Per discussion with Dr. 11-19-1969 3/24 AM, he would like to resume heparin and defer further recommendations to Cardiology and Neuro IR. Will target lower heparin level goal with aneurysm.  Heparin level this morning is slightly subtherapeutic at 0.27. Per nursing, there were no infusion issues overnight. H&H is trending down but wnl at 13.7/43.7, plts low at 131. He was previously supratherapeutic at 0.54 on 1700 units/hour and has been therapeutic x 2 on 1650 units/hour, so will not go over 1700 units/hour for now given bleed risk and previous supratherapeutic level on this rate.   Goal of Therapy:  Heparin  level 0.3-0.5 units/ml Monitor platelets by anticoagulation protocol: Yes   Plan:  No bolus given brain aneurysm  Increase heparin to 1700 units/hr  Check a 6 hour heparin level  Monitor daily heparin level and CBC Monitor for signs/symptoms of bleeding F/u Cardiology/Neuro IR plans and further imaging    Thank you,   07-20-2003, PharmD PGY-1 Pharmacy Resident   Please check amion for clinical pharmacist contact number

## 2019-11-11 NOTE — Progress Notes (Signed)
ANTICOAGULATION CONSULT NOTE - Follow Up Consult  Pharmacy Consult for Heparin Indication: atrial fibrillation  No Known Allergies  Patient Measurements: Height: 6\' 4"  (193 cm) Weight: 244 lb 0.8 oz (110.7 kg) IBW/kg (Calculated) : 86.8 Heparin Dosing Weight: 103.5  Vital Signs: Temp: 97.7 F (36.5 C) (03/28 0827) Temp Source: Oral (03/28 0827) BP: 94/70 (03/28 1637) Pulse Rate: 98 (03/28 1644)  Labs: Recent Labs    11/08/19 2306 11/08/19 2306 11/09/19 0939 11/09/19 0939 11/10/19 0505 11/10/19 0913 11/11/19 0742 11/11/19 1636  HGB 15.0   < > 14.3  --   --   --  13.7  --   HCT 48.3  --  45.9  --   --   --  43.7  --   PLT 161  --  140*  --   --   --  131*  --   HEPARINUNFRC 0.54   < > 0.34   < >  --  0.39 0.27* 0.31  CREATININE 0.95  --   --   --  0.94  --  0.91  --    < > = values in this interval not displayed.    Estimated Creatinine Clearance: 116.2 mL/min (by C-G formula based on SCr of 0.91 mg/dL).   Medical History: Past Medical History:  Diagnosis Date  . CHF (congestive heart failure) (HCC)   . Gout   . New onset atrial fibrillation [facility])     Assessment: Richard Graham is a 64 yr old male presenting with new-onset atrial fibrillation with CHF. He is not on any anticoagulation PTA. His CHADS2VASC score is 1 (new CHF).  IV heparin was held overnight 3/23-3/24 with large R MCA bifurcation aneurysm (no evidence to suggest rupture). Per discussion with Dr. 11-19-1969 3/24 AM, he would like to resume heparin and defer further recommendations to Cardiology and Neuro IR. Will target lower heparin level goal with aneurysm.  Heparin level this morning is slightly subtherapeutic at 0.27. Per nursing, there were no infusion issues overnight. H&H is trending down but wnl at 13.7/43.7, plts low at 131. He was previously supratherapeutic at 0.54 on 1700 units/hour and has been therapeutic x 2 on 1650 units/hour, so will not go over 1700 units/hour for now given bleed risk and  previous supratherapeutic level on this rate.   PM f/u > heparin level is now within goal range.  No overt bleeding or complications noted.  Goal of Therapy:  Heparin level 0.3-0.5 units/ml Monitor platelets by anticoagulation protocol: Yes   Plan:  Continue IV Heparin at current rate. F/u heparin level and CBC in AM.  07-20-2003, Reece Leader, [facility] Clinical Pharmacist Phone [phone]  11/11/2019 6:39 PM

## 2019-11-11 NOTE — Progress Notes (Signed)
Refused cpap.

## 2019-11-11 NOTE — Progress Notes (Signed)
  ReDS Clip Diuretic Study Pt study # [id number]  Your patient has been enrolled in the ReDS Clip Diuretic Study   Changes to prescribed diuretics recommended:  Add metolazone 5 mg x 1, continue lasix 80 mg IV BID Weight down 1 lb, less urine output yesterday, SCr stable  Provider contacted: Dr. Alvester Morin  REDS Clip  READING= 49%  CHEST RULER = 38 Clip Station = D   Orthodema score = 1 Signs/Symptoms Score   Mild edema, no orthopnea 0 No congestion  Moderate edema, no orthopnea 1 Low-grade orthodema/congestion  Severe edema OR orthopnea 2   Moderate edema and orthopnea 3 High-grade orthodema/congestion  Severe edema AND orthopnea 4    Danae Orleans, PharmD, [facility] PGY2 Cardiology Pharmacy Resident Phone [phone] 11/11/2019       9:12 AM  Please check [email or web address] for unit-specific pharmacist phone numbers

## 2019-11-11 NOTE — Progress Notes (Signed)
[facility]  PROGRESS NOTE    Richard Graham  KPT:[id number] DOB: [birth year] DOA: 11/05/2019 PCP: Johny Blamer, MD   Brief Narrative:   64 year old with history of prediabetes, obesity, gout admitted for worsening shortness of breath and lower extremity swelling. Found to be in atrial fibrillation, new diagnosis. Chest x-ray showed pulmonary edema. Cardiology team was consulted, started on diuretics, heparin drip and Cardizem drip. Suspected his hyponatremia was secondary to fluid overload.  3/26: He is doing ok this morning. Continuing diuresis. Decreasing lasix doses to BID Contact IR after heart is stable.   3/27: Continuing diuresis. ReDS diuretic study reveals 47% of fluid around right lung, improved but remains elevated.   3/28: ReDS study reveals 49% of fluid around lung, increased from yesterday. Net negative ~ 3.5 L documented although weight only down 1 lb.    Assessment & Plan:   Principal Problem:   Acute CHF (congestive heart failure) (HCC) Active Problems:   Gout   Thrombocytopenia (HCC)   New onset a-fib (HCC)   Morbid obesity (HCC)  Acute congestive heart failure, reduced ejection fraction 20-25%. Ischemic cardiomyopathy     - Echocardiogram showing EF 20-25%, akinetic apex.       - 2 g salt diet low fluid restriction     - Continue lasix dose at 80 mg BID. Will add one time dose of 5 mg of metolazone given the ReDS study results above     - Continue potassium 40 mg daily  New onset atrial fibrillation     - continue heparin/amiodarone gtt     - defer long-term plan to cards  Acute metabolic encephalopathy, resolved CO2 narcosis; improved.  Right MCA bifurcation aneurysm, 10 mm     - CT of the head is negative for any acute bleed but coincidental finding of large right MCA bifurcation aneurysm, 10 mm.     - Dicussed with Dr Corliss Skains.   Patient will require angiogram.     - MRI brain and MRA head complete; see below     - appreciate IR assistance,  angiography after heart stabilization  Thrombocytopenia     - trend, no evidence of bleed     - Stable  Hyponatremia     - stable, monitor  Hypokalemia     - Resolved.     - Will need continued repletion given high dose lasix      - Continue 40 meq daily  DVT prophylaxis: heparin gtt Code Status: FULL Disposition Plan: Remains inpt for continued IV diuresis and cerebral aneurysm workup.  Consultants:   IR  Cardiology  ROS:  Denies CP, N, V, ab pain . Remainder 10-pt ROS is negative for all not previously mentioned.  Subjective: Patient reports much improvement in his lower extremities. He has TED hose in place. Dyspnea is stable to mildly improved per patient. He reports difficulty sleeping last night but otherwise no new complaints. He is very talkative this morning.   Objective: Vitals:   11/10/19 2100 11/11/19 0048 11/11/19 0424 11/11/19 0827  BP:  (!) 176/138 (!) 89/67 101/77  Pulse:  74 78 87  Resp:  19 20 18   Temp:    97.7 F (36.5 C)  TempSrc:    Oral  SpO2: 97% 91% 90% 96%  Weight:  110.7 kg    Height:        Intake/Output Summary (Last 24 hours) at 11/11/2019 1430 Last data filed at 11/11/2019 1200 Gross per 24 hour  Intake 600 ml  Output  4001 ml  Net -3401 ml   Filed Weights   11/09/19 1300 11/10/19 0323 11/11/19 0048  Weight: 112.5 kg 111.3 kg 110.7 kg    Examination:  General: 64 y.o. male resting in bed in NAD Cardiovascular: RRR, +S1, S2, no m/g/r Respiratory: CTABL, no w/r/r, normal WOB GI: BS+, NDNT, no masses noted, no organomegaly noted MSK: No c/c; 1-2+ BLE edema with compression hose in place Neuro: alert and oriented, follows command Psyc: Appropriate interaction and affect, calm/cooperative   Data Reviewed: I have personally reviewed following labs and imaging studies.  CBC: Recent Labs  Lab 11/07/19 0006 11/08/19 0439 11/08/19 2306 11/09/19 0939 11/11/19 0742  WBC 8.8 7.7 8.2 7.9 6.6  NEUTROABS  --   --  5.3  --   --    HGB 14.9 13.8 15.0 14.3 13.7  HCT 47.4 43.7 48.3 45.9 43.7  MCV 96.5 95.6 96.2 96.0 95.4  PLT 182 PLATELET CLUMPS NOTED ON SMEAR, COUNT APPEARS ADEQUATE 161 140* 131*   Basic Metabolic Panel: Recent Labs  Lab 11/05/19 1822 11/05/19 2053 11/07/19 0006 11/08/19 0439 11/08/19 2306 11/10/19 0505 11/11/19 0742  NA  --    < > 133* 133* 132* 133* 132*  K  --    < > 3.8 3.4* 3.8 4.8 4.2  CL  --    < > 82* 82* 80* 85* 88*  CO2  --    < > 42* 39* 41* 34* 31  GLUCOSE  --    < > 123* 156* 168* 117* 100*  BUN  --    < > 13 8 11 13 14   CREATININE 0.76   < > 0.86 0.95 0.95 0.94 0.91  CALCIUM  --    < > 8.6* 8.6* 8.6* 9.1 9.1  MG 2.1  --   --  1.9 2.0 2.1 2.0  PHOS 4.0  --   --   --  3.4 3.8  --    < > = values in this interval not displayed.   GFR: Estimated Creatinine Clearance: 116.2 mL/min (by C-G formula based on SCr of 0.91 mg/dL). Liver Function Tests: Recent Labs  Lab 11/05/19 1721 11/05/19 2053 11/06/19 0312 11/08/19 2306 11/10/19 0505  AST 30 34 21  --   --   ALT 23 19 21   --   --   ALKPHOS 63 58 60  --   --   BILITOT 1.5* 1.6* 1.3*  --   --   PROT 6.0* 5.6* 5.6*  --   --   ALBUMIN 3.8 3.5 3.5 3.8 3.6   No results for input(s): LIPASE, AMYLASE in the last 168 hours. No results for input(s): AMMONIA in the last 168 hours. Coagulation Profile: No results for input(s): INR, PROTIME in the last 168 hours. Cardiac Enzymes: No results for input(s): CKTOTAL, CKMB, CKMBINDEX, TROPONINI in the last 168 hours. BNP (last 3 results) No results for input(s): PROBNP in the last 8760 hours. HbA1C: No results for input(s): HGBA1C in the last 72 hours. CBG: Recent Labs  Lab 11/06/19 1737 11/11/19 1212  GLUCAP 127* 155*   Lipid Profile: No results for input(s): CHOL, HDL, LDLCALC, TRIG, CHOLHDL, LDLDIRECT in the last 72 hours. Thyroid Function Tests: No results for input(s): TSH, T4TOTAL, FREET4, T3FREE, THYROIDAB in the last 72 hours. Anemia Panel: No results for  input(s): VITAMINB12, FOLATE, FERRITIN, TIBC, IRON, RETICCTPCT in the last 72 hours. Sepsis Labs: No results for input(s): PROCALCITON, LATICACIDVEN in the last 168 hours.  Recent Results (  from the past 240 hour(s))  SARS CORONAVIRUS 2 (TAT 6-24 HRS) Nasopharyngeal Nasopharyngeal Swab     Status: None   Collection Time: 11/05/19  6:56 PM   Specimen: Nasopharyngeal Swab  Result Value Ref Range Status   SARS Coronavirus 2 NEGATIVE NEGATIVE Final    Comment: (NOTE) SARS-CoV-2 target nucleic acids are NOT DETECTED. The SARS-CoV-2 RNA is generally detectable in upper and lower respiratory specimens during the acute phase of infection. Negative results do not preclude SARS-CoV-2 infection, do not rule out co-infections with other pathogens, and should not be used as the sole basis for treatment or other patient management decisions. Negative results must be combined with clinical observations, patient history, and epidemiological information. The expected result is Negative. Fact Sheet for Patients: SugarRoll.be Fact Sheet for Healthcare Providers: https://www.woods-mathews.com/ This test is not yet approved or cleared by the [place] FDA and  has been authorized for detection and/or diagnosis of SARS-CoV-2 by FDA under an Emergency Use Authorization (EUA). This EUA will remain  in effect (meaning this test can be used) for the duration of the COVID-19 declaration under Section 56 4(b)(1) of the Act, 21 U.S.C. section 360bbb-3(b)(1), unless the authorization is terminated or revoked sooner. Performed at [facility], [place] [address], [place], [place] [address]       Radiology Studies: No results found.   Scheduled Meds: . amiodarone  200 mg Oral BID  . furosemide  80 mg Intravenous BID  . metoprolol tartrate  50 mg Oral BID  . potassium chloride  40 mEq Oral Daily  . sodium chloride flush  3 mL Intravenous Q12H   Continuous  Infusions: . sodium chloride    . heparin 1,700 Units/hr (11/11/19 1302)     LOS: 6 days   Arlan Organ, DO [facility]  If 7PM-7AM, please contact night-coverage [email or web address] 11/11/2019, 2:30 PM

## 2019-11-11 NOTE — Progress Notes (Addendum)
Progress Note  Patient Name: Richard Graham Date of Encounter: 11/11/2019  Primary Cardiologist: Armanda Magic, MD   Subjective   Reports dyspnea improving.  BP 101/77 this morning.  Net -3.5 L yesterday, 21 L on admission, on IV Lasix 80 mg twice daily.  Renal function stable (creatinine 0.9).  Weight down 1 pound from yesterday  Inpatient Medications    Scheduled Meds: . amiodarone  200 mg Oral BID  . furosemide  80 mg Intravenous BID  . metoprolol tartrate  50 mg Oral BID  . potassium chloride  40 mEq Oral Daily   Continuous Infusions: . heparin 1,700 Units/hr (11/11/19 1033)   PRN Meds: acetaminophen **OR** acetaminophen, metoprolol tartrate, ondansetron **OR** ondansetron (ZOFRAN) IV   Vital Signs    Vitals:   11/10/19 2100 11/11/19 0048 11/11/19 0424 11/11/19 0827  BP:  (!) 176/138 (!) 89/67 101/77  Pulse:  74 78 87  Resp:  19 20 18   Temp:    97.7 F (36.5 C)  TempSrc:    Oral  SpO2: 97% 91% 90% 96%  Weight:  110.7 kg    Height:        Intake/Output Summary (Last 24 hours) at 11/11/2019 1151 Last data filed at 11/11/2019 0836 Gross per 24 hour  Intake 840 ml  Output 4001 ml  Net -3161 ml   Last 3 Weights 11/11/2019 11/10/2019 11/09/2019  Weight (lbs) 244 lb 0.8 oz 245 lb 6 oz 248 lb 0.3 oz  Weight (kg) 110.7 kg 111.3 kg 112.5 kg      Telemetry    Atrial fibrillation with rates in the 90s. - Personally Reviewed  ECG    No new ECG tracing today. - Personally Reviewed  Physical Exam   GEN: No acute distress.   Neck: Supple. Difficult to assess JVD due to body habitus.  Cardiac: Irregularly irregular rhythm, normal rate. No murmurs, rubs, or gallops. Radial pulses 2+ and equal bilaterally. Respiratory: No increased work of breathing. Right basilar crackles GI: Soft, non-distended, and non-tender.  MS: 1+ lower extremity edema.  Skin: Warm and dry. Neuro: Alert and oriented x3. No focal deficits. Psych: Normal affect. Responds  appropriately.  Labs    High Sensitivity Troponin:   Recent Labs  Lab 11/05/19 1731 11/05/19 1822 11/05/19 2053  TROPONINIHS 11 14 12       Chemistry Recent Labs  Lab 11/05/19 1721 11/05/19 1822 11/05/19 2053 11/05/19 2053 11/06/19 0312 11/06/19 1849 11/08/19 2306 11/10/19 0505 11/11/19 0742  NA 122*   < > 124*   < > 125*   < > 132* 133* 132*  K 5.0   < > 4.6   < > 4.2   < > 3.8 4.8 4.2  CL 81*   < > 87*   < > 82*   < > 80* 85* 88*  CO2 28   < > 27   < > 33*   < > 41* 34* 31  GLUCOSE 121*   < > 110*   < > 134*   < > 168* 117* 100*  BUN 17   < > 16   < > 16   < > 11 13 14   CREATININE 0.77   < > 0.79   < > 0.90   < > 0.95 0.94 0.91  CALCIUM 8.7*   < > 7.9*   < > 8.3*   < > 8.6* 9.1 9.1  PROT 6.0*  --  5.6*  --  5.6*  --   --   --   --  ALBUMIN 3.8   < > 3.5   < > 3.5  --  3.8 3.6  --   AST 30  --  34  --  21  --   --   --   --   ALT 23  --  19  --  21  --   --   --   --   ALKPHOS 63  --  58  --  60  --   --   --   --   BILITOT 1.5*  --  1.6*  --  1.3*  --   --   --   --   GFRNONAA >60   < > >60   < > >60   < > >60 >60 >60  GFRAA >60   < > >60   < > >60   < > >60 >60 >60  ANIONGAP 13   < > 10   < > 10   < > 11 14 13    < > = values in this interval not displayed.     Hematology Recent Labs  Lab 11/08/19 2306 11/09/19 0939 11/11/19 0742  WBC 8.2 7.9 6.6  RBC 5.02 4.78 4.58  HGB 15.0 14.3 13.7  HCT 48.3 45.9 43.7  MCV 96.2 96.0 95.4  MCH 29.9 29.9 29.9  MCHC 31.1 31.2 31.4  RDW 14.2 14.2 14.2  PLT 161 140* 131*    BNP Recent Labs  Lab 11/05/19 1616  BNP 318.7*     DDimer No results for input(s): DDIMER in the last 168 hours.   Radiology    No results found.  Cardiac Studies   Echocardiogram 11/06/2019: Impressions: 1. LV function is severely reduced, 20-25%. The mid anteroseptum into  apex is akinetic, almost aneurysmal, consistent with prior LAD infarction  (Q waves on EKG). The myocardium is thinned, futher supporting prior  infarction. On  contrast imaging, there  is no LV thrombus seen.  2. Left ventricular ejection fraction, by estimation, is 20 to 25%. The  left ventricle has severely decreased function. The left ventricle  demonstrates regional wall motion abnormalities (see scoring  diagram/findings for description). There is mild  concentric left ventricular hypertrophy. Left ventricular diastolic  function could not be evaluated.  3. Right ventricular systolic function is mildly reduced. The right  ventricular size is normal. There is mildly elevated pulmonary artery  systolic pressure. The estimated right ventricular systolic pressure is  42.2 mmHg.  4. Left atrial size was mildly dilated.  5. The mitral valve is grossly normal. Trivial mitral valve  regurgitation. No evidence of mitral stenosis.  6. The aortic valve is tricuspid. Aortic valve regurgitation is not  visualized. No aortic stenosis is present.   Conclusion(s)/Recommendation(s): Findings consistent with ischemic  cardiomyopathy.  Patient Profile   [name] is a 64 y.o. male with a history of pre-diabetes and obesity who is being seen for evaluation of new onset atrial fibrillation and CHF at the request of Dr. 77.   Assessment & Plan    New Onset Atrial Fibrillation - Presented with shortness of breath and 40-50lb weight gain over the last 4 weeks; however, patient suspects he has been in atrial fibrillation for a while.  - TSH normal. - Potassium low at times. Goal >4.0. Supplement as needed. - Magnesium Goal >2.0. Supplement as needed.   - Echo showed LVEF of 20-25%. Mid anteroseptum into apex is akinetic, almost aneurysmal, consistent with prior LAD infarction.  - Continue Lopressor 50mg   twice daily - CHA2DS2-VASC = 1 (new CHF). Currently on IV Heparin. Patient does have a large intracranial aneurysm that will require management.  -Has been on IV amiodarone drip.  Would favor discontinuing IV amiodarone given risk of chemical  cardioversion until LAA thrombus has been ruled out.  Will switch to p.o. amiodarone 200 mg twice daily, as low risk of chemical cardioversion on p.o. amiodarone in short-term -Plan for likely TEE/DCCV following LHC/RHC (the timing of cardioversion will have to be discussed with IR, as we will need to decide on plan for MCA aneurysm intervention before committing to uninterrupted anticoagulation following cardioversion)  New Onset Acute Systolic CHF - BNP 037 on admission which may be underestimated due to obesity.  - Chest x-ray with interstitial edema. - LVEF of 20-25%.  - Continue IV Lasix 80 mg twice daily.  REDS clip reading 49% today, metolazone 5mg  x1 was added - Continue Lopressor 50mg  twice daily. Consider consolidating to Toprol-XL prior to discharge. - No ACEi/ARB/ARNI at this time due to soft BP. Could start Entresto if BP allows (maybe as outpatient). - Continue to monitor daily weights, strict I/O's, and renal function. - He is able to lie flat without any dyspnea, will plan for [facility] tomorrow.  Risks and benefits of cardiac catheterization have been discussed with the patient.  These include bleeding, infection, kidney damage, stroke, heart attack, death.  The patient understands these risks and is willing to proceed.  R MCA aneurysm: seen by IR, intervention planned after cardiac optimization.  Pre-Diabetes - Hemoglobin A1c 6.0.  - Follow-up with PCP.  Obesity  - BMI 32.59.   For questions or updates, please contact [place] Please consult [email or web address] for contact info under      Signed, Donato Heinz, MD  11/11/2019, 11:51 AM

## 2019-11-12 ENCOUNTER — Encounter: Payer: Self-pay | Admitting: Cardiology

## 2019-11-12 ENCOUNTER — Encounter (HOSPITAL_COMMUNITY)
Admission: EM | Disposition: A | Discharge status: Home / Self Care | Payer: Self-pay | Source: Home / Self Care | Attending: Internal Medicine

## 2019-11-12 DIAGNOSIS — I5021 Acute systolic (congestive) heart failure: Principal | ICD-10-CM

## 2019-11-12 DIAGNOSIS — R0602 Shortness of breath: Secondary | ICD-10-CM

## 2019-11-12 DIAGNOSIS — I2510 Atherosclerotic heart disease of native coronary artery without angina pectoris: Secondary | ICD-10-CM

## 2019-11-12 DIAGNOSIS — E8770 Fluid overload, unspecified: Secondary | ICD-10-CM

## 2019-11-12 HISTORY — PX: RIGHT/LEFT HEART CATH AND CORONARY ANGIOGRAPHY: CATH118266

## 2019-11-12 LAB — POCT I-STAT 7, (LYTES, BLD GAS, ICA,H+H)
Acid-Base Excess: 10 mmol/L — ABNORMAL HIGH (ref 0.0–2.0)
Bicarbonate: 35.7 mmol/L — ABNORMAL HIGH (ref 20.0–28.0)
Calcium, Ion: 1.22 mmol/L (ref 1.15–1.40)
HCT: 48 % (ref 39.0–52.0)
Hemoglobin: 16.3 g/dL (ref 13.0–17.0)
O2 Saturation: 97 %
Potassium: 4.4 mmol/L (ref 3.5–5.1)
Sodium: 132 mmol/L — ABNORMAL LOW (ref 135–145)
TCO2: 37 mmol/L — ABNORMAL HIGH (ref 22–32)
pCO2 arterial: 51.1 mmHg — ABNORMAL HIGH (ref 32.0–48.0)
pH, Arterial: 7.452 — ABNORMAL HIGH (ref 7.350–7.450)
pO2, Arterial: 92 mmHg (ref 83.0–108.0)

## 2019-11-12 LAB — CBC
HCT: 45.8 % (ref 39.0–52.0)
Hemoglobin: 14.6 g/dL (ref 13.0–17.0)
MCH: 29.9 pg (ref 26.0–34.0)
MCHC: 31.9 g/dL (ref 30.0–36.0)
MCV: 93.9 fL (ref 80.0–100.0)
Platelets: 145 10*3/uL — ABNORMAL LOW (ref 150–400)
RBC: 4.88 MIL/uL (ref 4.22–5.81)
RDW: 14.1 % (ref 11.5–15.5)
WBC: 5.9 10*3/uL (ref 4.0–10.5)
nRBC: 0 % (ref 0.0–0.2)

## 2019-11-12 LAB — POCT I-STAT EG7
Acid-Base Excess: 10 mmol/L — ABNORMAL HIGH (ref 0.0–2.0)
Bicarbonate: 37.6 mmol/L — ABNORMAL HIGH (ref 20.0–28.0)
Calcium, Ion: 1.23 mmol/L (ref 1.15–1.40)
HCT: 48 % (ref 39.0–52.0)
Hemoglobin: 16.3 g/dL (ref 13.0–17.0)
O2 Saturation: 63 %
Potassium: 4.3 mmol/L (ref 3.5–5.1)
Sodium: 133 mmol/L — ABNORMAL LOW (ref 135–145)
TCO2: 39 mmol/L — ABNORMAL HIGH (ref 22–32)
pCO2, Ven: 58.2 mmHg (ref 44.0–60.0)
pH, Ven: 7.418 (ref 7.250–7.430)
pO2, Ven: 33 mmHg (ref 32.0–45.0)

## 2019-11-12 LAB — GLUCOSE, CAPILLARY: Glucose-Capillary: 152 mg/dL — ABNORMAL HIGH (ref 70–99)

## 2019-11-12 LAB — MAGNESIUM: Magnesium: 2.2 mg/dL (ref 1.7–2.4)

## 2019-11-12 LAB — HEPARIN LEVEL (UNFRACTIONATED): Heparin Unfractionated: 0.46 IU/mL (ref 0.30–0.70)

## 2019-11-12 SURGERY — RIGHT/LEFT HEART CATH AND CORONARY ANGIOGRAPHY
Anesthesia: LOCAL

## 2019-11-12 MED ORDER — HEPARIN SODIUM (PORCINE) 1000 UNIT/ML IJ SOLN
INTRAMUSCULAR | Status: AC
  Filled 2019-11-12: qty 1

## 2019-11-12 MED ORDER — SODIUM CHLORIDE 0.9 % IV SOLN: 250.0000 mL | INTRAVENOUS | Status: DC | PRN

## 2019-11-12 MED ORDER — SODIUM CHLORIDE 0.9% FLUSH: 3.0000 mL | INTRAVENOUS | Status: DC | PRN

## 2019-11-12 MED ORDER — MIDAZOLAM HCL 2 MG/2ML IJ SOLN
INTRAMUSCULAR | Status: AC
  Filled 2019-11-12: qty 2

## 2019-11-12 MED ORDER — FENTANYL CITRATE (PF) 100 MCG/2ML IJ SOLN
INTRAMUSCULAR | Status: AC
  Filled 2019-11-12: qty 2

## 2019-11-12 MED ORDER — MIDAZOLAM HCL 2 MG/2ML IJ SOLN
INTRAMUSCULAR | Status: DC | PRN
  Administered 2019-11-12 (×2): 1 mg via INTRAVENOUS

## 2019-11-12 MED ORDER — OXYCODONE-ACETAMINOPHEN 5-325 MG PO TABS: 1.0000 | ORAL_TABLET | ORAL | Status: DC | PRN

## 2019-11-12 MED ORDER — OXYCODONE HCL 5 MG PO TABS: 5.0000 mg | ORAL_TABLET | ORAL | Status: DC | PRN

## 2019-11-12 MED ORDER — VERAPAMIL HCL 2.5 MG/ML IV SOLN
INTRAVENOUS | Status: AC
  Filled 2019-11-12: qty 2

## 2019-11-12 MED ORDER — HEPARIN (PORCINE) IN NACL 1000-0.9 UT/500ML-% IV SOLN
INTRAVENOUS | Status: DC | PRN
  Administered 2019-11-12: 500 mL

## 2019-11-12 MED ORDER — VERAPAMIL HCL 2.5 MG/ML IV SOLN
INTRAVENOUS | Status: DC | PRN
  Administered 2019-11-12: 10 mL via INTRA_ARTERIAL

## 2019-11-12 MED ORDER — ONDANSETRON HCL 4 MG/2ML IJ SOLN: 4.0000 mg | Freq: Four times a day (QID) | INTRAMUSCULAR | Status: DC | PRN

## 2019-11-12 MED ORDER — HEPARIN SODIUM (PORCINE) 1000 UNIT/ML IJ SOLN
INTRAMUSCULAR | Status: DC | PRN
  Administered 2019-11-12: 5000 [IU] via INTRAVENOUS

## 2019-11-12 MED ORDER — IOHEXOL 350 MG/ML SOLN
INTRAVENOUS | Status: DC | PRN
  Administered 2019-11-12: 50 mL

## 2019-11-12 MED ORDER — HYDRALAZINE HCL 20 MG/ML IJ SOLN: 10.0000 mg | INTRAMUSCULAR | Status: AC | PRN

## 2019-11-12 MED ORDER — LIDOCAINE HCL (PF) 1 % IJ SOLN
INTRAMUSCULAR | Status: DC | PRN
  Administered 2019-11-12 (×2): 2 mL

## 2019-11-12 MED ORDER — LIDOCAINE HCL (PF) 1 % IJ SOLN
INTRAMUSCULAR | Status: AC
  Filled 2019-11-12: qty 30

## 2019-11-12 MED ORDER — LABETALOL HCL 5 MG/ML IV SOLN: 10.0000 mg | INTRAVENOUS | Status: AC | PRN

## 2019-11-12 MED ORDER — ACETAMINOPHEN 325 MG PO TABS: 650.0000 mg | ORAL_TABLET | ORAL | Status: DC | PRN

## 2019-11-12 MED ORDER — HEPARIN (PORCINE) IN NACL 1000-0.9 UT/500ML-% IV SOLN
INTRAVENOUS | Status: AC
  Filled 2019-11-12: qty 1000

## 2019-11-12 MED ORDER — SODIUM CHLORIDE 0.9% FLUSH
3.0000 mL | Freq: Two times a day (BID) | INTRAVENOUS | Status: DC
  Administered 2019-11-12 – 2019-11-15 (×6): 3 mL via INTRAVENOUS

## 2019-11-12 MED ORDER — ASPIRIN EC 81 MG PO TBEC
81.0000 mg | DELAYED_RELEASE_TABLET | Freq: Every day | ORAL | Status: DC
  Administered 2019-11-13 – 2019-11-16 (×3): 81 mg via ORAL
  Filled 2019-11-12 (×5): qty 1

## 2019-11-12 MED ORDER — HEPARIN (PORCINE) 25000 UT/250ML-% IV SOLN
1500.0000 [IU]/h | INTRAVENOUS | Status: DC
  Administered 2019-11-12: 22:00:00 1700 [IU]/h via INTRAVENOUS
  Administered 2019-11-13: 1850 [IU]/h via INTRAVENOUS
  Administered 2019-11-14 (×2): 1700 [IU]/h via INTRAVENOUS
  Administered 2019-11-14 – 2019-11-16 (×3): 1500 [IU]/h via INTRAVENOUS
  Filled 2019-11-12 (×6): qty 250

## 2019-11-12 MED ORDER — OXYCODONE-ACETAMINOPHEN 10-325 MG PO TABS: 1.0000 | ORAL_TABLET | ORAL | Status: DC | PRN

## 2019-11-12 MED ORDER — FENTANYL CITRATE (PF) 100 MCG/2ML IJ SOLN
INTRAMUSCULAR | Status: DC | PRN
  Administered 2019-11-12: 25 ug via INTRAVENOUS

## 2019-11-12 SURGICAL SUPPLY — 13 items
CATH 5FR JL3.5 JR4 ANG PIG MP (CATHETERS) ×1 IMPLANT
CATH BALLN WEDGE 5F 110CM (CATHETERS) ×1 IMPLANT
DEVICE RAD COMP TR BAND LRG (VASCULAR PRODUCTS) ×1 IMPLANT
GLIDESHEATH SLEND SS 6F .021 (SHEATH) ×1 IMPLANT
GUIDEWIRE INQWIRE 1.5J.035X260 (WIRE) IMPLANT
INQWIRE 1.5J .035X260CM (WIRE) ×2
KIT HEART LEFT (KITS) ×2 IMPLANT
PACK CARDIAC CATHETERIZATION (CUSTOM PROCEDURE TRAY) ×2 IMPLANT
SHEATH GLIDE SLENDER 4/5FR (SHEATH) ×1 IMPLANT
SHEATH PROBE COVER 6X72 (BAG) ×1 IMPLANT
TRANSDUCER W/STOPCOCK (MISCELLANEOUS) ×2 IMPLANT
TUBING CIL FLEX 10 FLL-RA (TUBING) ×2 IMPLANT
WIRE HI TORQ VERSACORE-J 145CM (WIRE) ×1 IMPLANT

## 2019-11-12 NOTE — Progress Notes (Signed)
  ReDS Clip Diuretic Study Pt study # [id number]  Your patient has been enrolled in the ReDS Clip Diuretic Study   Changes to prescribed diuretics recommended:  Consider another dose of metolazone 5 mg x 1, continue lasix 80 mg IV BID - f/u with cath results.  Weight down 13 lb, uop 6.1 L/24 hr, SCr stable at 0.91.  Provider contacted: Dr. Tenny Craw  REDS Clip  READING= 46%  CHEST RULER = 38 Clip Station = D   Orthodema score = 1 Signs/Symptoms Score   Mild edema, no orthopnea 0 No congestion  Moderate edema, no orthopnea 1 Low-grade orthodema/congestion  Severe edema OR orthopnea 2   Moderate edema and orthopnea 3 High-grade orthodema/congestion  Severe edema AND orthopnea 4    Sherron Monday, PharmD, BCCCP Clinical Pharmacist  Phone: [phone]  Please check AMION for all [facility] Pharmacy phone numbers After 10:00 PM, call Main Pharmacy [phone] 11/12/2019       8:12 AM

## 2019-11-12 NOTE — Progress Notes (Signed)
ANTICOAGULATION CONSULT NOTE - Follow Up Consult  Pharmacy Consult for Heparin Indication: atrial fibrillation  No Known Allergies  Patient Measurements: Height: 6\' 4"  (193 cm) Weight: 231 lb 7.7 oz (105 kg) IBW/kg (Calculated) : 86.8 Heparin Dosing Weight: 103.5  Vital Signs: Temp: 97.5 F (36.4 C) (03/29 0556) Temp Source: Oral (03/29 0556) BP: 97/62 (03/29 0700) Pulse Rate: 96 (03/29 0700)  Labs: Recent Labs     0000 11/09/19 0939 11/10/19 0505 11/10/19 0913 11/11/19 0742 11/11/19 1636 11/12/19 0514  HGB   < > 14.3  --   --  13.7  --  14.6  HCT  --  45.9  --   --  43.7  --  45.8  PLT  --  140*  --   --  131*  --  145*  HEPARINUNFRC  --  0.34  --    < > 0.27* 0.31 0.46  CREATININE  --   --  0.94  --  0.91  --   --    < > = values in this interval not displayed.    Estimated Creatinine Clearance: 113.5 mL/min (by C-G formula based on SCr of 0.91 mg/dL).   Medical History: Past Medical History:  Diagnosis Date  . CHF (congestive heart failure) (HCC)   . Gout   . New onset atrial fibrillation [facility])     Assessment: Mr Blethen is a 64 yr old male presenting with new-onset atrial fibrillation with CHF. He is not on any anticoagulation PTA. His CHADS2VASC score is 1 (new CHF).  IV heparin was held overnight 3/23-3/24 with large R MCA bifurcation aneurysm (no evidence to suggest rupture). Per discussion with Dr. 11-19-1969 3/24 AM, he would like to resume heparin and defer further recommendations to Cardiology and Neuro IR. Will target lower heparin level goal with aneurysm.  Heparin level this AM remains therapeutic at 0.46. H/H wnl. Plt low stable. Planning a R/L cath today.  Following cath, cardiology will coordinate with IR regarding timing of cardioversion and diagnostic angiogram since patient will need uninterrupted anticoagulation post cardioversion.   Goal of Therapy:  Heparin level 0.3-0.5 units/ml Monitor platelets by anticoagulation protocol: Yes    Plan:  No bolus given brain aneurysm  Continue heparin at 1700 units/hr  Monitor daily heparin level and CBC Monitor for signs/symptoms of bleeding F/u anticoagulation plans after R/L cath today     Thank you,   4/24, PharmD., BCPS Clinical Pharmacist Clinical phone for 11/12/19 until 3:30pm: [phone] If after 3:30pm, please refer to [facility] for unit-specific pharmacist

## 2019-11-12 NOTE — Interval H&P Note (Signed)
Cath Lab Visit (complete for each Cath Lab visit)  Clinical Evaluation Leading to the Procedure:   ACS: Yes.    Non-ACS:    Anginal Classification: CCS IV  Anti-ischemic medical therapy: Minimal Therapy (1 class of medications)  Non-Invasive Test Results: High-risk stress test findings: cardiac mortality >3%/year  Prior CABG: No previous CABG      History and Physical Interval Note:  11/12/2019 1:54 PM  Richard Graham  has presented today for surgery, with the diagnosis of HF.  The various methods of treatment have been discussed with the patient and family. After consideration of risks, benefits and other options for treatment, the patient has consented to  Procedure(s): RIGHT/LEFT HEART CATH AND CORONARY ANGIOGRAPHY (N/A) as a surgical intervention.  The patient's history has been reviewed, patient examined, no change in status, stable for surgery.  I have reviewed the patient's chart and labs.  Questions were answered to the patient's satisfaction.     Lance Muss

## 2019-11-12 NOTE — Progress Notes (Signed)
PROGRESS NOTE    Richard Graham  YKD:[id number] DOB: Oct 15, 1955 DOA: 11/05/2019 PCP: [name], MD   Brief Narrative:  64 year old with history of prediabetes, obesity, gout admitted for worsening shortness of breath and lower extremity swelling. Found to be in atrial fibrillation, new diagnosis. Chest x-ray showed pulmonary edema. Cardiology team was consulted, started on diuretics, heparin drip and Cardizem drip. Suspected his hyponatremia was secondary to fluid overload.  3/28: ReDS study reveals 49% of fluid around lung, increased from yesterday. Net negative ~ 3.5 L documented although weight only down 1 lb.    Assessment & Plan:   Principal Problem:   Acute CHF (congestive heart failure) (HCC) Active Problems:   Gout   Thrombocytopenia (HCC)   New onset a-fib (HCC)   Morbid obesity (HCC)  Acute congestive heart failure, reduced ejection fraction 20-25%. Ischemic cardiomyopathy - Echocardiogram showing EF 20-25%, akinetic apex.  - 2 g salt diet low fluid restriction     - Continue lasix dose at 80 mg BID. Will add one time dose of 5 mg of metolazone given the ReDS study results above     - Continue potassium 40 mg daily -Cardiology to do cardiac cath today  New onset atrial fibrillation - continue heparin drip/amiodarone oral - defer long-term plan to cards, they are considering TEE/DCCV depending on intervention to MCA aneurysm  Acute metabolic encephalopathy, resolved CO2 narcosis; improved.  Right MCA bifurcation aneurysm, 10 mm - CT of the head is negative for any acute bleed but coincidental finding of large right MCA bifurcation aneurysm, 10 mm. - Dicussed with Dr [name].  Patient will require angiogram. - MRI brain and MRA head complete; see below - appreciate IR assistance, angiography after heart stabilization  Thrombocytopenia - trend, no evidence of bleed     - Stable  Hyponatremia     - stable,  monitor  Hypokalemia     - Resolved.     - Will need continued repletion given high dose lasix      - Continue 40 meq daily  DVT prophylaxis: heparin drip Code Status: full Family Communication: patient and wife at bedside Disposition Plan:  . Patient came from:home            . Anticipated d/c place:home . Barriers to d/c OR conditions which need to be met to effect a safe d/c: awaiting cardiac cath, angiogram and potential intervention to MCA aneurysm also possible TEE/DCCV for A fib   Consultants:   Cardiology  IR  Procedures:   Cath scheduled for 11/12/19  Antimicrobials:   none  Subjective: Feeling well, awaiting cath. Denies chest pains or SOB. Denies abdominal pain or problems. Understands plan.   Objective: Vitals:   11/12/19 0158 11/12/19 0556 11/12/19 0700 11/12/19 0824  BP: 101/70  97/62   Pulse: 90 90 96   Resp: 18 20    Temp: (!) 97.3 F (36.3 C) (!) 97.5 F (36.4 C)  97.7 F (36.5 C)  TempSrc: Oral Oral    SpO2: 90% 93% 96%   Weight:  105 kg    Height:        Intake/Output Summary (Last 24 hours) at 11/12/2019 1240 Last data filed at 11/12/2019 0844 Gross per 24 hour  Intake 1025.15 ml  Output 6150 ml  Net -5124.85 ml   Filed Weights   11/10/19 0323 11/11/19 0048 11/12/19 0556  Weight: 111.3 kg 110.7 kg 105 kg   Examination:  General exam: Appears calm and comfortable  Respiratory system:  Clear to auscultation. Respiratory effort normal. Cardiovascular system: S1 & S2 heard. No JVD, murmurs, rubs, gallops or clicks. No pedal edema. Gastrointestinal system: Abdomen is nondistended, soft and nontender. No organomegaly or masses felt. Normal bowel sounds heard. Central nervous system: Alert and oriented. No focal neurological deficits. Extremities: Symmetric 5 x 5 power. Skin: No rashes, lesions or ulcers Psychiatry: Judgement and insight appear normal. Mood & affect appropriate.   Data Reviewed: I have personally reviewed following labs  and imaging studies  CBC: Recent Labs  Lab 11/08/19 0439 11/08/19 2306 11/09/19 0939 11/11/19 0742 11/12/19 0514  WBC 7.7 8.2 7.9 6.6 5.9  NEUTROABS  --  5.3  --   --   --   HGB 13.8 15.0 14.3 13.7 14.6  HCT 43.7 48.3 45.9 43.7 45.8  MCV 95.6 96.2 96.0 95.4 93.9  PLT PLATELET CLUMPS NOTED ON SMEAR, COUNT APPEARS ADEQUATE 161 140* 131* 454*   Basic Metabolic Panel: Recent Labs  Lab 11/05/19 1822 11/05/19 1822 11/05/19 2053 11/07/19 0006 11/08/19 0439 11/08/19 2306 11/10/19 0505 11/11/19 0742 11/12/19 0514  NA  --   --    < > 133* 133* 132* 133* 132*  --   K  --   --    < > 3.8 3.4* 3.8 4.8 4.2  --   CL  --   --    < > 82* 82* 80* 85* 88*  --   CO2  --   --    < > 42* 39* 41* 34* 31  --   GLUCOSE  --   --    < > 123* 156* 168* 117* 100*  --   BUN  --   --    < > '13 8 11 13 14  ' --   CREATININE 0.76  --    < > 0.86 0.95 0.95 0.94 0.91  --   CALCIUM  --   --    < > 8.6* 8.6* 8.6* 9.1 9.1  --   MG 2.1   < >  --   --  1.9 2.0 2.1 2.0 2.2  PHOS 4.0  --   --   --   --  3.4 3.8  --   --    < > = values in this interval not displayed.   GFR: Estimated Creatinine Clearance: 113.5 mL/min (by C-G formula based on SCr of 0.91 mg/dL). Liver Function Tests: Recent Labs  Lab 11/05/19 1721 11/05/19 2053 11/06/19 0312 11/08/19 2306 11/10/19 0505  AST 30 34 21  --   --   ALT '23 19 21  ' --   --   ALKPHOS 63 58 60  --   --   BILITOT 1.5* 1.6* 1.3*  --   --   PROT 6.0* 5.6* 5.6*  --   --   ALBUMIN 3.8 3.5 3.5 3.8 3.6   No results for input(s): LIPASE, AMYLASE in the last 168 hours. No results for input(s): AMMONIA in the last 168 hours. Coagulation Profile: No results for input(s): INR, PROTIME in the last 168 hours. Cardiac Enzymes: No results for input(s): CKTOTAL, CKMB, CKMBINDEX, TROPONINI in the last 168 hours. BNP (last 3 results) No results for input(s): PROBNP in the last 8760 hours. HbA1C: No results for input(s): HGBA1C in the last 72 hours. CBG: Recent Labs   Lab 11/06/19 1737 11/11/19 1212 11/11/19 1621  GLUCAP 127* 155* 123*   Lipid Profile: No results for input(s): CHOL, HDL, LDLCALC, TRIG, CHOLHDL, LDLDIRECT in the  last 72 hours. Thyroid Function Tests: No results for input(s): TSH, T4TOTAL, FREET4, T3FREE, THYROIDAB in the last 72 hours. Anemia Panel: No results for input(s): VITAMINB12, FOLATE, FERRITIN, TIBC, IRON, RETICCTPCT in the last 72 hours. Sepsis Labs: No results for input(s): PROCALCITON, LATICACIDVEN in the last 168 hours.  Recent Results (from the past 240 hour(s))  SARS CORONAVIRUS 2 (TAT 6-24 HRS) Nasopharyngeal Nasopharyngeal Swab     Status: None   Collection Time: 11/05/19  6:56 PM   Specimen: Nasopharyngeal Swab  Result Value Ref Range Status   SARS Coronavirus 2 NEGATIVE NEGATIVE Final    Comment: (NOTE) SARS-CoV-2 target nucleic acids are NOT DETECTED. The SARS-CoV-2 RNA is generally detectable in upper and lower respiratory specimens during the acute phase of infection. Negative results do not preclude SARS-CoV-2 infection, do not rule out co-infections with other pathogens, and should not be used as the sole basis for treatment or other patient management decisions. Negative results must be combined with clinical observations, patient history, and epidemiological information. The expected result is Negative. Fact Sheet for Patients: SugarRoll.be Fact Sheet for Healthcare Providers: https://www.woods-mathews.com/ This test is not yet approved or cleared by the [place] FDA and  has been authorized for detection and/or diagnosis of SARS-CoV-2 by FDA under an Emergency Use Authorization (EUA). This EUA will remain  in effect (meaning this test can be used) for the duration of the COVID-19 declaration under Section 56 4(b)(1) of the Act, 21 U.S.C. section 360bbb-3(b)(1), unless the authorization is terminated or revoked sooner. Performed at [facility], [place] [address], [place],  [address]     Radiology Studies: No results found.  Scheduled Meds: . amiodarone  200 mg Oral BID  . furosemide  80 mg Intravenous BID  . metoprolol tartrate  50 mg Oral BID  . potassium chloride  40 mEq Oral Daily  . sodium chloride flush  3 mL Intravenous Q12H   Continuous Infusions: . sodium chloride    . sodium chloride 10 mL/hr at 11/12/19 0602  . heparin 1,700 Units/hr (11/11/19 1302)    LOS: 7 days   Time spent: [place], MD [facility]  To contact the attending provider between 7A-7P or the covering provider during after hours 7P-7A, please log into the web site [email or web address] and access using universal [place] password for that web site. If you do not have the password, please call the hospital operator.  11/12/2019, 12:40 PM

## 2019-11-12 NOTE — Progress Notes (Signed)
ANTICOAGULATION CONSULT NOTE - Follow Up Consult  Pharmacy Consult for Heparin Indication: atrial fibrillation  No Known Allergies  Patient Measurements: Height: 6\' 4"  (193 cm) Weight: 231 lb 7.7 oz (105 kg) IBW/kg (Calculated) : 86.8 Heparin Dosing Weight: 103.5  Vital Signs: Temp: 97.7 F (36.5 C) (03/29 0824) Temp Source: Oral (03/29 0556) BP: 105/77 (03/29 1429) Pulse Rate: 85 (03/29 1429)  Labs: Recent Labs    11/10/19 0505 11/10/19 0913 11/11/19 0742 11/11/19 1636 11/12/19 0514  HGB  --   --  13.7  --  14.6  HCT  --   --  43.7  --  45.8  PLT  --   --  131*  --  145*  HEPARINUNFRC  --    < > 0.27* 0.31 0.46  CREATININE 0.94  --  0.91  --   --    < > = values in this interval not displayed.    Estimated Creatinine Clearance: 113.5 mL/min (by C-G formula based on SCr of 0.91 mg/dL).   Medical History: Past Medical History:  Diagnosis Date  . CHF (congestive heart failure) (HCC)   . Gout   . New onset atrial fibrillation [facility])     Assessment: Richard Graham is a 64 yr old male presenting with new-onset atrial fibrillation with CHF. He is not on any anticoagulation PTA. His CHADS2VASC score is 1 (new CHF).  IV heparin was held overnight 3/23-3/24 with large R MCA bifurcation aneurysm (no evidence to suggest rupture). Per discussion with Dr. 11-19-1969 3/24 AM, he would like to resume heparin and defer further recommendations to Cardiology and Neuro IR. Will target lower heparin level goal with aneurysm.  Patient now s/p cath 3/29. Pharmacy consulted to resume heparin 8 hours post-sheath removal. Sheath removed at 1426. Will resume heparin at previously therapeutic rate. CBC stable. No active bleed issues reported.  Cardiology coordinating with IR regarding timing of cardioversion and diagnostic angiogram since patient will need uninterrupted anticoagulation post-cardioversion.  Goal of Therapy:  Heparin level 0.3-0.5 units/ml Monitor platelets by anticoagulation  protocol: Yes   Plan:  No bolus given brain aneurysm  Resume heparin at previously therapeutic rate 1700 units/hr at 2230 (8 hours post-sheath removal) Monitor daily heparin level and CBC, s/sx bleeding F/u Cardiology and IR plans for further procedures   4/29, PharmD, BCPS Please check AMION for all [facility] Pharmacy contact numbers Clinical Pharmacist 11/12/2019 3:09 PM

## 2019-11-12 NOTE — H&P (View-Only) (Signed)
Progress Note  Patient Name: Richard Graham Date of Encounter: 11/12/2019  Primary Cardiologist: Richard Him, MD   Subjective   Pt breathing OK  No CP    Inpatient Medications    Scheduled Meds: . amiodarone  200 mg Oral BID  . furosemide  80 mg Intravenous BID  . metoprolol tartrate  50 mg Oral BID  . potassium chloride  40 mEq Oral Daily  . sodium chloride flush  3 mL Intravenous Q12H   Continuous Infusions: . sodium chloride    . sodium chloride 10 mL/hr at 11/12/19 0602  . heparin 1,700 Units/hr (11/11/19 1302)   PRN Meds: sodium chloride, acetaminophen **OR** acetaminophen, metoprolol tartrate, ondansetron **OR** ondansetron (ZOFRAN) IV, sodium chloride flush   Vital Signs    Vitals:   11/11/19 1954 11/12/19 0158 11/12/19 0556 11/12/19 0700  BP: 104/75 101/70  97/62  Pulse: 96 90 90 96  Resp: 19 18 20    Temp: 98.7 F (37.1 C) (!) 97.3 F (36.3 C) (!) 97.5 F (36.4 C)   TempSrc: Oral Oral Oral   SpO2: 95% 90% 93% 96%  Weight:   105 kg   Height:        Intake/Output Summary (Last 24 hours) at 11/12/2019 0814 Last data filed at 11/12/2019 0556 Gross per 24 hour  Intake 754.87 ml  Output 6150 ml  Net -5395.13 ml   Net negative  27 L  Last 3 Weights 11/12/2019 11/11/2019 11/10/2019  Weight (lbs) 231 lb 7.7 oz 244 lb 0.8 oz 245 lb 6 oz  Weight (kg) 105 kg 110.7 kg 111.3 kg      Telemetry     Afib  80s  - Personally Reviewed  ECG    No new ECG tracing today. - Personally Reviewed  Physical Exam   GEN: No acute distress.   Neck: Supple. Difficult to assess JVP is not elevatd    Cardiac: Irregularly irregular rhythm, normal rate. No murmurs, rubs, or gallops. Radial pulses 2+ and equal bilaterally. Respiratory:  CTA   GI: Soft, non-distended, and non-tender.  ZO:XWRU + lower extremity edema in feet  .  Skin: Warm and dry. Neuro: Alert and oriented x3. No focal deficits. Psych: Normal affect. Responds appropriately.  Labs    High  Sensitivity Troponin:   Recent Labs  Lab 11/05/19 1731 11/05/19 1822 11/05/19 2053  TROPONINIHS 11 14 12       Chemistry Recent Labs  Lab 11/05/19 1721 11/05/19 1822 11/05/19 2053 11/05/19 2053 11/06/19 0312 11/06/19 1849 11/08/19 2306 11/10/19 0505 11/11/19 0742  NA 122*   < > 124*   < > 125*   < > 132* 133* 132*  K 5.0   < > 4.6   < > 4.2   < > 3.8 4.8 4.2  CL 81*   < > 87*   < > 82*   < > 80* 85* 88*  CO2 28   < > 27   < > 33*   < > 41* 34* 31  GLUCOSE 121*   < > 110*   < > 134*   < > 168* 117* 100*  BUN 17   < > 16   < > 16   < > 11 13 14   CREATININE 0.77   < > 0.79   < > 0.90   < > 0.95 0.94 0.91  CALCIUM 8.7*   < > 7.9*   < > 8.3*   < > 8.6* 9.1 9.1  PROT  6.0*  --  5.6*  --  5.6*  --   --   --   --   ALBUMIN 3.8   < > 3.5   < > 3.5  --  3.8 3.6  --   AST 30  --  34  --  21  --   --   --   --   ALT 23  --  19  --  21  --   --   --   --   ALKPHOS 63  --  58  --  60  --   --   --   --   BILITOT 1.5*  --  1.6*  --  1.3*  --   --   --   --   GFRNONAA >60   < > >60   < > >60   < > >60 >60 >60  GFRAA >60   < > >60   < > >60   < > >60 >60 >60  ANIONGAP 13   < > 10   < > 10   < > 11 14 13    < > = values in this interval not displayed.     Hematology Recent Labs  Lab 11/09/19 0939 11/11/19 0742 11/12/19 0514  WBC 7.9 6.6 5.9  RBC 4.78 4.58 4.88  HGB 14.3 13.7 14.6  HCT 45.9 43.7 45.8  MCV 96.0 95.4 93.9  MCH 29.9 29.9 29.9  MCHC 31.2 31.4 31.9  RDW 14.2 14.2 14.1  PLT 140* 131* 145*    BNP Recent Labs  Lab 11/05/19 1616  BNP 318.7*     DDimer No results for input(s): DDIMER in the last 168 hours.   Radiology    No results found.  Cardiac Studies   Echocardiogram 11/06/2019: Impressions: 1. LV function is severely reduced, 20-25%. The mid anteroseptum into  apex is akinetic, almost aneurysmal, consistent with prior LAD infarction  (Q waves on EKG). The myocardium is thinned, futher supporting prior  infarction. On contrast imaging, there  is no  LV thrombus seen.  2. Left ventricular ejection fraction, by estimation, is 20 to 25%. The  left ventricle has severely decreased function. The left ventricle  demonstrates regional wall motion abnormalities (see scoring  diagram/findings for description). There is mild  concentric left ventricular hypertrophy. Left ventricular diastolic  function could not be evaluated.  3. Right ventricular systolic function is mildly reduced. The right  ventricular size is normal. There is mildly elevated pulmonary artery  systolic pressure. The estimated right ventricular systolic pressure is  42.2 mmHg.  4. Left atrial size was mildly dilated.  5. The mitral valve is grossly normal. Trivial mitral valve  regurgitation. No evidence of mitral stenosis.  6. The aortic valve is tricuspid. Aortic valve regurgitation is not  visualized. No aortic stenosis is present.   Conclusion(s)/Recommendation(s): Findings consistent with ischemic  cardiomyopathy.  Patient Profile   Richard Graham is a 64 y.o. male with a history of pre-diabetes and obesity who is being seen for evaluation of new onset atrial fibrillation and CHF at the request of Dr. 77.   Assessment & Plan    New Onset Atrial Fibrillation - Presented with shortness of breath and 40-50lb weight gain over the last 4 weeks; however, patient suspects he has been in atrial fibrillation for a while.  - TSH normal. - Potassium low at times. Goal >4.0. Supplement as needed. - Magnesium Goal >2.0. Supplement as needed.   - Echo  showed LVEF of 20-25%. Mid anteroseptum into apex is akinetic, almost aneurysmal, consistent with prior LAD infarction.  - Continue Lopressor 50mg twice daily - CHA2DS2-VASC = 1 (new CHF). Currently on IV Heparin. Patient does have a large intracranial aneurysm that will require management.  -Has been on IV amiodarone drip.  Would favor discontinuing IV amiodarone given risk of chemical cardioversion until LAA thrombus  has been ruled out.  Will switch to p.o. amiodarone 200 mg twice daily, as low risk of chemical cardioversion on p.o. amiodarone in short-term -Plan for likely TEE/DCCV following LHC/RHC (the timing of cardioversion will have to be discussed with IR, as we will need to decide on plan for MCA aneurysm intervention before committing to uninterrupted anticoagulation following cardioversion)  New Onset Acute Systolic CHF - BNP 318 on admission which may be underestimated due to obesity.  - Chest x-ray with interstitial edema. - LVEF of 20-25%.  - Continue IV Lasix 80 mg twice daily with metalazone   REDS clip reading 46% today, Hold diuresis until after cath   - Continue Lopressor 50mg twice daily. Consider consolidating to Toprol-XL prior to discharge. - No ACEi/ARB/ARNI at this time due to soft BP. Could start Entresto if BP allows (maybe as outpatient). Plan for cath today  \  R MCA aneurysm: seen by IR, intervention planned after cardiac optimization.  Pre-Diabetes - Hemoglobin A1c 6.0.  - Follow-up with PCP.  Obesity  - BMI 32.59.   For questions or updates, please contact [facility] Please consult [email or web address] for contact info under      Signed, Jader Desai, MD  11/12/2019, 8:14 AM      

## 2019-11-12 NOTE — Progress Notes (Signed)
Progress Note  Patient Name: Richard Graham Date of Encounter: 11/12/2019  Primary Cardiologist: Fransico Him, MD   Subjective   Pt breathing OK  No CP    Inpatient Medications    Scheduled Meds: . amiodarone  200 mg Oral BID  . furosemide  80 mg Intravenous BID  . metoprolol tartrate  50 mg Oral BID  . potassium chloride  40 mEq Oral Daily  . sodium chloride flush  3 mL Intravenous Q12H   Continuous Infusions: . sodium chloride    . sodium chloride 10 mL/hr at 11/12/19 0602  . heparin 1,700 Units/hr (11/11/19 1302)   PRN Meds: sodium chloride, acetaminophen **OR** acetaminophen, metoprolol tartrate, ondansetron **OR** ondansetron (ZOFRAN) IV, sodium chloride flush   Vital Signs    Vitals:   11/11/19 1954 11/12/19 0158 11/12/19 0556 11/12/19 0700  BP: 104/75 101/70  97/62  Pulse: 96 90 90 96  Resp: 19 18 20    Temp: 98.7 F (37.1 C) (!) 97.3 F (36.3 C) (!) 97.5 F (36.4 C)   TempSrc: Oral Oral Oral   SpO2: 95% 90% 93% 96%  Weight:   105 kg   Height:        Intake/Output Summary (Last 24 hours) at 11/12/2019 0814 Last data filed at 11/12/2019 0556 Gross per 24 hour  Intake 754.87 ml  Output 6150 ml  Net -5395.13 ml   Net negative  27 L  Last 3 Weights 11/12/2019 11/11/2019 11/10/2019  Weight (lbs) 231 lb 7.7 oz 244 lb 0.8 oz 245 lb 6 oz  Weight (kg) 105 kg 110.7 kg 111.3 kg      Telemetry     Afib  80s  - Personally Reviewed  ECG    No new ECG tracing today. - Personally Reviewed  Physical Exam   GEN: No acute distress.   Neck: Supple. Difficult to assess JVP is not elevatd    Cardiac: Irregularly irregular rhythm, normal rate. No murmurs, rubs, or gallops. Radial pulses 2+ and equal bilaterally. Respiratory:  CTA   GI: Soft, non-distended, and non-tender.  ZO:XWRU + lower extremity edema in feet  .  Skin: Warm and dry. Neuro: Alert and oriented x3. No focal deficits. Psych: Normal affect. Responds appropriately.  Labs    High  Sensitivity Troponin:   Recent Labs  Lab 11/05/19 1731 11/05/19 1822 11/05/19 2053  TROPONINIHS 11 14 12       Chemistry Recent Labs  Lab 11/05/19 1721 11/05/19 1822 11/05/19 2053 11/05/19 2053 11/06/19 0312 11/06/19 1849 11/08/19 2306 11/10/19 0505 11/11/19 0742  NA 122*   < > 124*   < > 125*   < > 132* 133* 132*  K 5.0   < > 4.6   < > 4.2   < > 3.8 4.8 4.2  CL 81*   < > 87*   < > 82*   < > 80* 85* 88*  CO2 28   < > 27   < > 33*   < > 41* 34* 31  GLUCOSE 121*   < > 110*   < > 134*   < > 168* 117* 100*  BUN 17   < > 16   < > 16   < > 11 13 14   CREATININE 0.77   < > 0.79   < > 0.90   < > 0.95 0.94 0.91  CALCIUM 8.7*   < > 7.9*   < > 8.3*   < > 8.6* 9.1 9.1  PROT  6.0*  --  5.6*  --  5.6*  --   --   --   --   ALBUMIN 3.8   < > 3.5   < > 3.5  --  3.8 3.6  --   AST 30  --  34  --  21  --   --   --   --   ALT 23  --  19  --  21  --   --   --   --   ALKPHOS 63  --  58  --  60  --   --   --   --   BILITOT 1.5*  --  1.6*  --  1.3*  --   --   --   --   GFRNONAA >60   < > >60   < > >60   < > >60 >60 >60  GFRAA >60   < > >60   < > >60   < > >60 >60 >60  ANIONGAP 13   < > 10   < > 10   < > 11 14 13    < > = values in this interval not displayed.     Hematology Recent Labs  Lab 11/09/19 0939 11/11/19 0742 11/12/19 0514  WBC 7.9 6.6 5.9  RBC 4.78 4.58 4.88  HGB 14.3 13.7 14.6  HCT 45.9 43.7 45.8  MCV 96.0 95.4 93.9  MCH 29.9 29.9 29.9  MCHC 31.2 31.4 31.9  RDW 14.2 14.2 14.1  PLT 140* 131* 145*    BNP Recent Labs  Lab 11/05/19 1616  BNP 318.7*     DDimer No results for input(s): DDIMER in the last 168 hours.   Radiology    No results found.  Cardiac Studies   Echocardiogram 11/06/2019: Impressions: 1. LV function is severely reduced, 20-25%. The mid anteroseptum into  apex is akinetic, almost aneurysmal, consistent with prior LAD infarction  (Q waves on EKG). The myocardium is thinned, futher supporting prior  infarction. On contrast imaging, there  is no  LV thrombus seen.  2. Left ventricular ejection fraction, by estimation, is 20 to 25%. The  left ventricle has severely decreased function. The left ventricle  demonstrates regional wall motion abnormalities (see scoring  diagram/findings for description). There is mild  concentric left ventricular hypertrophy. Left ventricular diastolic  function could not be evaluated.  3. Right ventricular systolic function is mildly reduced. The right  ventricular size is normal. There is mildly elevated pulmonary artery  systolic pressure. The estimated right ventricular systolic pressure is  42.2 mmHg.  4. Left atrial size was mildly dilated.  5. The mitral valve is grossly normal. Trivial mitral valve  regurgitation. No evidence of mitral stenosis.  6. The aortic valve is tricuspid. Aortic valve regurgitation is not  visualized. No aortic stenosis is present.   Conclusion(s)/Recommendation(s): Findings consistent with ischemic  cardiomyopathy.  Patient Profile   Richard Graham is a 64 y.o. male with a history of pre-diabetes and obesity who is being seen for evaluation of new onset atrial fibrillation and CHF at the request of Dr. 77.   Assessment & Plan    New Onset Atrial Fibrillation - Presented with shortness of breath and 40-50lb weight gain over the last 4 weeks; however, patient suspects he has been in atrial fibrillation for a while.  - TSH normal. - Potassium low at times. Goal >4.0. Supplement as needed. - Magnesium Goal >2.0. Supplement as needed.   - Echo  showed LVEF of 20-25%. Mid anteroseptum into apex is akinetic, almost aneurysmal, consistent with prior LAD infarction.  - Continue Lopressor 50mg  twice daily - CHA2DS2-VASC = 1 (new CHF). Currently on IV Heparin. Patient does have a large intracranial aneurysm that will require management.  -Has been on IV amiodarone drip.  Would favor discontinuing IV amiodarone given risk of chemical cardioversion until LAA thrombus  has been ruled out.  Will switch to p.o. amiodarone 200 mg twice daily, as low risk of chemical cardioversion on p.o. amiodarone in short-term -Plan for likely TEE/DCCV following LHC/RHC (the timing of cardioversion will have to be discussed with IR, as we will need to decide on plan for MCA aneurysm intervention before committing to uninterrupted anticoagulation following cardioversion)  New Onset Acute Systolic CHF - BNP 318 on admission which may be underestimated due to obesity.  - Chest x-ray with interstitial edema. - LVEF of 20-25%.  - Continue IV Lasix 80 mg twice daily with metalazone   REDS clip reading 46% today, Hold diuresis until after cath   - Continue Lopressor 50mg  twice daily. Consider consolidating to Toprol-XL prior to discharge. - No ACEi/ARB/ARNI at this time due to soft BP. Could start Entresto if BP allows (maybe as outpatient). Plan for cath today  \  R MCA aneurysm: seen by IR, intervention planned after cardiac optimization.  Pre-Diabetes - Hemoglobin A1c 6.0.  - Follow-up with PCP.  Obesity  - BMI 32.59.   For questions or updates, please contact [facility] Please consult [email or web address] for contact info under      Signed, , MD  11/12/2019, 8:14 AM

## 2019-11-13 LAB — BASIC METABOLIC PANEL
Anion gap: 11 (ref 5–15)
BUN: 28 mg/dL — ABNORMAL HIGH (ref 8–23)
CO2: 34 mmol/L — ABNORMAL HIGH (ref 22–32)
Calcium: 9.8 mg/dL (ref 8.9–10.3)
Chloride: 90 mmol/L — ABNORMAL LOW (ref 98–111)
Creatinine, Ser: 1.02 mg/dL (ref 0.61–1.24)
GFR calc Af Amer: >60 mL/min (ref >60)
GFR calc non Af Amer: >60 mL/min (ref >60)
Glucose, Bld: 107 mg/dL — ABNORMAL HIGH (ref 70–99)
Potassium: 4.4 mmol/L (ref 3.5–5.1)
Sodium: 135 mmol/L (ref 135–145)

## 2019-11-13 LAB — CBC
HCT: 48 % (ref 39.0–52.0)
Hemoglobin: 15.3 g/dL (ref 13.0–17.0)
MCH: 29.7 pg (ref 26.0–34.0)
MCHC: 31.9 g/dL (ref 30.0–36.0)
MCV: 93.2 fL (ref 80.0–100.0)
Platelets: 177 10*3/uL (ref 150–400)
RBC: 5.15 MIL/uL (ref 4.22–5.81)
RDW: 14 % (ref 11.5–15.5)
WBC: 7.9 10*3/uL (ref 4.0–10.5)
nRBC: 0 % (ref 0.0–0.2)

## 2019-11-13 LAB — HEPARIN LEVEL (UNFRACTIONATED)
Heparin Unfractionated: 0.24 IU/mL — ABNORMAL LOW (ref 0.30–0.70)
Heparin Unfractionated: 0.51 IU/mL (ref 0.30–0.70)
Heparin Unfractionated: 0.6 IU/mL (ref 0.30–0.70)

## 2019-11-13 LAB — PROTIME-INR
INR: 1.1 (ref 0.8–1.2)
Prothrombin Time: 14 seconds (ref 11.4–15.2)

## 2019-11-13 MED ORDER — TEMAZEPAM 7.5 MG PO CAPS: 7.5000 mg | ORAL_CAPSULE | Freq: Every evening | ORAL | Status: DC | PRN

## 2019-11-13 MED ORDER — TICAGRELOR 90 MG PO TABS
180.0000 mg | ORAL_TABLET | Freq: Once | ORAL | Status: AC
  Administered 2019-11-13: 180 mg via ORAL
  Filled 2019-11-13: qty 2

## 2019-11-13 MED ORDER — TICAGRELOR 90 MG PO TABS
90.0000 mg | ORAL_TABLET | Freq: Two times a day (BID) | ORAL | Status: DC
  Administered 2019-11-13: 90 mg via ORAL
  Filled 2019-11-13: qty 1

## 2019-11-13 NOTE — Progress Notes (Signed)
Referring Physician(s): Amoin, Ankit Chirag; Pricilla Riffle  Supervising Physician: Julieanne Cotton  Patient Status:  [facility] - In-pt  Chief Complaint: None  Subjective:  Right MCA bifurcation aneurysm. Patient awake and alert sitting in bed with no complaints at this time. Accompanied by wife at bedside. Moving all extremities.   Allergies: Patient has no known allergies.  Medications: Prior to Admission medications   Medication Sig Start Date End Date Taking? Authorizing Provider  aspirin (ASPIRIN EC) 81 MG EC tablet Take 81 mg by mouth daily.   Yes [provider]  colchicine 0.6 MG tablet Take 0.6 mg by mouth 2 (two) times daily as needed (for gout flares).    Yes [provider]  diclofenac sodium (VOLTAREN) 1 % GEL Apply 2 g topically 2 (two) times daily as needed (to affected sites- for arthritis pain).    Yes [provider]  Omega-3 Fatty Acids (FISH OIL) 1200 MG CAPS Take 1,200 mg by mouth every other day.   Yes [provider]  oxyCODONE-acetaminophen (PERCOCET) 10-325 MG tablet Take 1 tablet by mouth every 4 (four) hours as needed for pain. 06/27/15  Yes Axel Filler, MD  oxyCODONE-acetaminophen (ROXICET) 5-325 MG tablet Take 1-2 tablets by mouth every 4 (four) hours as needed. Patient not taking: Reported on 11/05/2019 06/27/15   Axel Filler, MD     Vital Signs: BP 103/79 (BP Location: Left Arm)   Pulse 98   Temp 97.8 F (36.6 C) (Oral)   Resp 19   Ht 6\' 4"  (1.93 m)   Wt 228 lb 6.3 oz (103.6 kg)   SpO2 97%   BMI 27.80 kg/m   Physical Exam Vitals and nursing note reviewed.  Constitutional:      General: He is not in acute distress. Cardiovascular:     Rate and Rhythm: Normal rate and regular rhythm.     Heart sounds: Normal heart sounds. No murmur.  Pulmonary:     Effort: Pulmonary effort is normal. No respiratory distress.     Breath sounds: Normal breath sounds. No wheezing.  Skin:    General: Skin is  warm and dry.  Neurological:     Mental Status: He is alert and oriented to person, place, and time.     Imaging: CARDIAC CATHETERIZATION  Result Date: 11/12/2019  Mid LAD lesion is 100% stenosed. Chronically occluded with left to left collaterals.  LV end diastolic pressure is mildly elevated.  There is severe left ventricular systolic dysfunction.  The left ventricular ejection fraction is less than 25% by visual estimate.  There is no aortic valve stenosis.  Hemodynamic findings consistent with mild pulmonary hypertension.  Consider viability study of anterior wall.  If viable, would plan for CTO PCI of LAD.  If no chance of recovery, then medical therapy without revascularization. Explained to wife, Beth [phone]    Labs:  CBC: Recent Labs    11/09/19 0939 11/09/19 0939 11/11/19 0742 11/11/19 0742 11/12/19 0514 11/12/19 1410 11/12/19 1413 11/13/19 0453  WBC 7.9  --  6.6  --  5.9  --   --  7.9  HGB 14.3   < > 13.7   < > 14.6 16.3 16.3 15.3  HCT 45.9   < > 43.7   < > 45.8 48.0 48.0 48.0  PLT 140*  --  131*  --  145*  --   --  177   < > = values in this interval not displayed.  COAGS: No results for input(s): INR, APTT in the last 8760 hours.  BMP: Recent Labs    11/08/19 2306 11/08/19 2306 11/10/19 0505 11/10/19 0505 11/11/19 0742 11/12/19 1410 11/12/19 1413 11/13/19 0453  NA 132*   < > 133*   < > 132* 132* 133* 135  K 3.8   < > 4.8   < > 4.2 4.4 4.3 4.4  CL 80*  --  85*  --  88*  --   --  90*  CO2 41*  --  34*  --  31  --   --  34*  GLUCOSE 168*  --  117*  --  100*  --   --  107*  BUN 11  --  13  --  14  --   --  28*  CALCIUM 8.6*  --  9.1  --  9.1  --   --  9.8  CREATININE 0.95  --  0.94  --  0.91  --   --  1.02  GFRNONAA >60  --  >60  --  >60  --   --  >60  GFRAA >60  --  >60  --  >60  --   --  >60   < > = values in this interval not displayed.    LIVER FUNCTION TESTS: Recent Labs    11/05/19 1721 11/05/19 1721 11/05/19 2053  11/06/19 0312 11/08/19 2306 11/10/19 0505  BILITOT 1.5*  --  1.6* 1.3*  --   --   AST 30  --  34 21  --   --   ALT 23  --  19 21  --   --   ALKPHOS 63  --  58 60  --   --   PROT 6.0*  --  5.6* 5.6*  --   --   ALBUMIN 3.8   < > 3.5 3.5 3.8 3.6   < > = values in this interval not displayed.    Assessment and Plan:  Right MCA bifurcation aneurysm. Plan for image-guided diagnostic cerebral arteriogram tentatively for tomorrow in IR with Dr. Corliss Skains. Will plan treatment at a later date based on the results of this test. Patient underwent cardiac catheterization yesterday, results revealed mid LAD lesion 100% stenosed- recommending continued medical therapy at this time; in addition has new onset atrial fibrillation, recommending cardioversion with 1 month uninterrupted anticoagulation- because of this cardiology recommending intervening on right MCA aneurysm prior to cardioversion. Patient to be NPO at midnight. Start DAPT- patient to be loaded with Brilinta 180 mg x1 today, then begin Brilinta 90 mg twice daily starting this evening. Continue taking Aspirin 81 mg once daily. Will obtain P2Y12 tomorrow AM to check effectiveness of DAPT. INR 1.1 today.  Risks and benefits of diagnostic cerebral arteriogram were discussed with the patient including, but not limited to bleeding, infection, vascular injury, stroke, or contrast induced renal failure. This interventional procedure involves the use of X-rays and because of the nature of the planned procedure, it is possible that we will have prolonged use of X-ray fluoroscopy. Potential radiation risks to you include (but are not limited to) the following: - A slightly elevated risk for cancer  several years later in life. This risk is typically less than 0.5% percent. This risk is low in comparison to the normal incidence of human cancer, which is 33% for women and 50% for men according to the [employer]. - Radiation induced injury can  include skin redness, resembling  a rash, tissue breakdown / ulcers and hair loss (which can be temporary or permanent).  The likelihood of either of these occurring depends on the difficulty of the procedure and whether you are sensitive to radiation due to previous procedures, disease, or genetic conditions.  IF your procedure requires a prolonged use of radiation, you will be notified and given written instructions for further action.  It is your responsibility to monitor the irradiated area for the 2 weeks following the procedure and to notify your physician if you are concerned that you have suffered a radiation induced injury.   All of the patient's questions were answered, patient is agreeable to proceed. Consent signed and in IR control room.   Electronically Signed: Earley Abide, PA-C 11/13/2019, 11:08 AM   I spent a total of 35 Minutes at the the patient's bedside AND on the patient's hospital floor or unit, greater than 50% of which was counseling/coordinating care for right MCA bifurcation aneurysm/diagnostic cerebral arteriogram.

## 2019-11-13 NOTE — Progress Notes (Signed)
ANTICOAGULATION CONSULT NOTE - Follow Up Consult  Pharmacy Consult for Heparin Indication: atrial fibrillation  No Known Allergies  Patient Measurements: Height: 6\' 4"  (193 cm) Weight: 228 lb 6.3 oz (103.6 kg) IBW/kg (Calculated) : 86.8 Heparin Dosing Weight: 103.5 kg  Vital Signs: Temp: 97.8 F (36.6 C) (03/30 1938) Temp Source: Oral (03/30 1938) BP: 113/71 (03/30 1938) Pulse Rate: 102 (03/30 1938)  Labs: Recent Labs    11/11/19 0742 11/11/19 1636 11/12/19 0514 11/12/19 0514 11/12/19 1410 11/12/19 1410 11/12/19 1413 11/13/19 0453 11/13/19 1012 11/13/19 1548 11/13/19 2311  HGB 13.7  --  14.6   < > 16.3   < > 16.3 15.3  --   --   --   HCT 43.7  --  45.8   < > 48.0  --  48.0 48.0  --   --   --   PLT 131*  --  145*  --   --   --   --  177  --   --   --   LABPROT  --   --   --   --   --   --   --   --  14.0  --   --   INR  --   --   --   --   --   --   --   --  1.1  --   --   HEPARINUNFRC 0.27*   < > 0.46   < >  --   --   --  0.24*  --  0.51 0.60  CREATININE 0.91  --   --   --   --   --   --  1.02  --   --   --    < > = values in this interval not displayed.    Estimated Creatinine Clearance: 93.4 mL/min (by C-G formula based on SCr of 1.02 mg/dL).   Medical History: Past Medical History:  Diagnosis Date  . CHF (congestive heart failure) (HCC)   . Gout   . New onset atrial fibrillation [facility])     Assessment: Richard Graham is a 64 yr old male presenting with new-onset atrial fibrillation with CHF. He is not on any anticoagulation PTA. His CHADS2VASC score is 1 (new CHF).  IV heparin was held overnight 3/23-3/24 with large R MCA bifurcation aneurysm (no evidence to suggest rupture). Per discussion with Dr. 11-19-1969 3/24 AM, he would like to resume heparin and defer further recommendations to Cardiology and Neuro IR. Will target lower heparin level goal with aneurysm.  Patient is now S/P cath on 3/29. Pharmacy was consulted to resume heparin 8 hours post-sheath removal.  Sheath removed at 1426. Will resume heparin at previously therapeutic rate. CBC stable. No active bleed issues reported.  Cardiology coordinating with IR regarding timing of cardioversion and diagnostic angiogram since patient will need uninterrupted anticoagulation post-cardioversion. Pt is tentatively scheduled for image-guided diagnostic cerebral arteriogram in IR tomorrow. Due to aneurysm, Cardiology recommends delaying TEE/ cardioversion and using rate control and anticoagulation for a fib at this time.  Heparin level now up to 0.6  Goal of Therapy:  Heparin level 0.3-0.5 units/ml Monitor platelets by anticoagulation protocol: Yes   Plan:  Decrease heparin infusion to 1700 units/hr Monitor daily heparin level and CBC Monitor for signs/symptoms of bleeding F/U after IR procedure tomorrow  Thank you 4/29, PharmD Clinical Pharmacist 11/13/2019 11:47 PM

## 2019-11-13 NOTE — Progress Notes (Signed)
  Mobility Specialist Criteria Algorithm Info.  SATURATION QUALIFICATIONS: (This note is used to comply with regulatory documentation for home oxygen)  Patient Saturations on Room Air at Rest = n/a% Patient Saturations on Room Air while Ambulating = n/a% Patient Saturations on 3 Liters of oxygen while Ambulating = 96% Please briefly explain why patient needs home oxygen:  Mobility Team: [facility] elevated:Self regulated Activity: Ambulated in hall;Transferred:  Bed to chair;Sat and stood x 3 (to chair after ambulation) Range of motion: Active;All extremities Level of assistance: Standby assist, set-up cues, supervision of patient - no hands on Assistive device: Front wheel walker Minutes sitting in chair:  Minutes stood: 6 minutes Minutes ambulated: 6 minutes Distance ambulated (ft): 500 ft Mobility response: Tolerated well;RN notified (Pt has no complaints other than slight weakness related to atrophy) Bed Position: Chair (Recliner chair )    11/13/2019 1:36 PM

## 2019-11-13 NOTE — Progress Notes (Signed)
Progress Note  Patient Name: Richard Graham Date of Encounter: 11/13/2019  Primary Cardiologist: Armanda Magic, MD   Subjective   Patient denies CP   NO SOB  Laying flat   Ate 1 bite of eggs   Inpatient Medications    Scheduled Meds: . amiodarone  200 mg Oral BID  . aspirin EC  81 mg Oral Daily  . furosemide  80 mg Intravenous BID  . metoprolol tartrate  50 mg Oral BID  . potassium chloride  40 mEq Oral Daily  . sodium chloride flush  3 mL Intravenous Q12H  . sodium chloride flush  3 mL Intravenous Q12H   Continuous Infusions: . sodium chloride    . heparin 1,850 Units/hr (11/13/19 0732)   PRN Meds: sodium chloride, acetaminophen **OR** acetaminophen, acetaminophen, metoprolol tartrate, ondansetron **OR** ondansetron (ZOFRAN) IV, ondansetron (ZOFRAN) IV, oxyCODONE-acetaminophen **AND** oxyCODONE, sodium chloride flush   Vital Signs    Vitals:   11/12/19 2300 11/13/19 0000 11/13/19 0100 11/13/19 0319  BP:    103/75  Pulse: 100  81 96  Resp:  19  19  Temp:  97.6 F (36.4 C)  98.3 F (36.8 C)  TempSrc:  Oral  Oral  SpO2: 97%  93%   Weight:    103.6 kg  Height:        Intake/Output Summary (Last 24 hours) at 11/13/2019 0740 Last data filed at 11/13/2019 0700 Gross per 24 hour  Intake 1176.14 ml  Output 4875 ml  Net -3698.86 ml   Net negative  27 L  Last 3 Weights 11/13/2019 11/12/2019 11/11/2019  Weight (lbs) 228 lb 6.3 oz 231 lb 7.7 oz 244 lb 0.8 oz  Weight (kg) 103.6 kg 105 kg 110.7 kg      Telemetry     Afib  70s to 90s   - Personally Reviewed  ECG    No new ECG tracing today. - Personally Reviewed  Physical Exam   GEN: No acute distress.   Neck: Supple. Difficult to assess JVP is not elevated      Cardiac: Irregularly irregular rhythm, normal rate. No murmurs, rubs, or gallops. Radial pulses 2+ and equal bilaterally. Respiratory:  CTA   GI: Soft, non-distended, and non-tender.  IR:SWNI + lower extremity edema in feet  .  Skin: Warm and  dry. Neuro: Alert and oriented x3. No focal deficits. Psych: Normal affect. Responds appropriately.  Labs    High Sensitivity Troponin:   Recent Labs  Lab 11/05/19 1731 11/05/19 1822 11/05/19 2053  TROPONINIHS 11 14 12       Chemistry Recent Labs  Lab 11/08/19 2306 11/08/19 2306 11/10/19 0505 11/10/19 0505 11/11/19 0742 11/11/19 0742 11/12/19 1410 11/12/19 1413 11/13/19 0453  NA 132*   < > 133*   < > 132*   < > 132* 133* 135  K 3.8   < > 4.8   < > 4.2   < > 4.4 4.3 4.4  CL 80*   < > 85*  --  88*  --   --   --  90*  CO2 41*   < > 34*  --  31  --   --   --  34*  GLUCOSE 168*   < > 117*  --  100*  --   --   --  107*  BUN 11   < > 13  --  14  --   --   --  28*  CREATININE 0.95   < >  0.94  --  0.91  --   --   --  1.02  CALCIUM 8.6*   < > 9.1  --  9.1  --   --   --  9.8  ALBUMIN 3.8  --  3.6  --   --   --   --   --   --   GFRNONAA >60   < > >60  --  >60  --   --   --  >60  GFRAA >60   < > >60  --  >60  --   --   --  >60  ANIONGAP 11   < > 14  --  13  --   --   --  11   < > = values in this interval not displayed.     Hematology Recent Labs  Lab 11/11/19 0742 11/11/19 0742 11/12/19 0514 11/12/19 0514 11/12/19 1410 11/12/19 1413 11/13/19 0453  WBC 6.6  --  5.9  --   --   --  7.9  RBC 4.58  --  4.88  --   --   --  5.15  HGB 13.7   < > 14.6   < > 16.3 16.3 15.3  HCT 43.7   < > 45.8   < > 48.0 48.0 48.0  MCV 95.4  --  93.9  --   --   --  93.2  MCH 29.9  --  29.9  --   --   --  29.7  MCHC 31.4  --  31.9  --   --   --  31.9  RDW 14.2  --  14.1  --   --   --  14.0  PLT 131*  --  145*  --   --   --  177   < > = values in this interval not displayed.    BNP No results for input(s): BNP, PROBNP in the last 168 hours.   DDimer No results for input(s): DDIMER in the last 168 hours.   Radiology    CARDIAC CATHETERIZATION  Result Date: 11/12/2019  Mid LAD lesion is 100% stenosed. Chronically occluded with left to left collaterals.  LV end diastolic pressure is  mildly elevated.  There is severe left ventricular systolic dysfunction.  The left ventricular ejection fraction is less than 25% by visual estimate.  There is no aortic valve stenosis.  Hemodynamic findings consistent with mild pulmonary hypertension.  Consider viability study of anterior wall.  If viable, would plan for CTO PCI of LAD.  If no chance of recovery, then medical therapy without revascularization. Explained to wife, Beth [phone]    Cardiac Studies   L heart cath  11/12/19   Mid LAD lesion is 100% stenosed. Chronically occluded with left to left collaterals.  LV end diastolic pressure is mildly elevated.  There is severe left ventricular systolic dysfunction.  The left ventricular ejection fraction is less than 25% by visual estimate.  There is no aortic valve stenosis.  Hemodynamic findings consistent with mild pulmonary hypertension.   Consider viability study of anterior wall.  If viable, would plan for CTO PCI of LAD.  If no chance of recovery, then medical therapy without revascularization.    Echocardiogram 11/06/2019: Impressions: 1. LV function is severely reduced, 20-25%. The mid anteroseptum into  apex is akinetic, almost aneurysmal, consistent with prior LAD infarction  (Q waves on EKG). The myocardium is thinned, futher supporting prior  infarction. On contrast  imaging, there  is no LV thrombus seen.  2. Left ventricular ejection fraction, by estimation, is 20 to 25%. The  left ventricle has severely decreased function. The left ventricle  demonstrates regional wall motion abnormalities (see scoring  diagram/findings for description). There is mild  concentric left ventricular hypertrophy. Left ventricular diastolic  function could not be evaluated.  3. Right ventricular systolic function is mildly reduced. The right  ventricular size is normal. There is mildly elevated pulmonary artery  systolic pressure. The estimated right ventricular  systolic pressure is  27.7 mmHg.  4. Left atrial size was mildly dilated.  5. The mitral valve is grossly normal. Trivial mitral valve  regurgitation. No evidence of mitral stenosis.  6. The aortic valve is tricuspid. Aortic valve regurgitation is not  visualized. No aortic stenosis is present.   Conclusion(s)/Recommendation(s): Findings consistent with ischemic  cardiomyopathy.  Patient Profile   Mr. Buth is a 64 y.o. male with a history of pre-diabetes and obesity who is being seen for evaluation of new onset atrial fibrillation and CHF at the request of Dr. Doristine Bosworth.   Assessment & Plan    New Onset Atrial Fibrillationanticoagulation following cardioversion)  New Onset Acute Systolic CHF  Left heart cath results above   Pt had event awhile ago(remembers one day)   Fills via left to left collaterals  May have viability in region but no CP       REcomm:  Medical Rx for now   In time, if worsening CHF, would consider viability evaluation with possible CTO attempt if viable         IN light of aneurysm, this  is not a priority at present  Atrial fibrillation  A new diagnosis for him   Rates controlled   To return to NSR would require TEE and if neg for thrombus cardioversion with 1 month uninterrupted anticoagulaton.   Given aneurysm would suggest delaying and using rate control and anticoagulation      R MCA aneurysm: I have reviewed with IR   Plan would be to intervene now with use of antiplt agents (in additon to anticoagulants) after      HL WOuld get lipids today   Start statin with goal of LDL 70 or less     For questions or updates, please contact [place] Please consult [email or web address] for contact info under      Signed, Dorris Carnes, MD  11/13/2019, 7:40 AM

## 2019-11-13 NOTE — Progress Notes (Signed)
ANTICOAGULATION CONSULT NOTE - Follow Up Consult  Pharmacy Consult for Heparin Indication: atrial fibrillation  No Known Allergies  Patient Measurements: Height: 6\' 4"  (193 cm) Weight: 228 lb 6.3 oz (103.6 kg) IBW/kg (Calculated) : 86.8 Heparin Dosing Weight: 103.5 kg  Vital Signs: Temp: 97.8 F (36.6 C) (03/30 0831) Temp Source: Oral (03/30 0831) BP: 102/84 (03/30 1100) Pulse Rate: 87 (03/30 1100)  Labs: Recent Labs    11/11/19 0742 11/11/19 1636 11/12/19 0514 11/12/19 0514 11/12/19 1410 11/12/19 1410 11/12/19 1413 11/13/19 0453 11/13/19 1012 11/13/19 1548  HGB 13.7  --  14.6   < > 16.3   < > 16.3 15.3  --   --   HCT 43.7  --  45.8   < > 48.0  --  48.0 48.0  --   --   PLT 131*  --  145*  --   --   --   --  177  --   --   LABPROT  --   --   --   --   --   --   --   --  14.0  --   INR  --   --   --   --   --   --   --   --  1.1  --   HEPARINUNFRC 0.27*   < > 0.46  --   --   --   --  0.24*  --  0.51  CREATININE 0.91  --   --   --   --   --   --  1.02  --   --    < > = values in this interval not displayed.    Estimated Creatinine Clearance: 93.4 mL/min (by C-G formula based on SCr of 1.02 mg/dL).   Medical History: Past Medical History:  Diagnosis Date  . CHF (congestive heart failure) ([place])   . Gout   . New onset atrial fibrillation [facility])     Assessment: Mr Valent is a 64 yr old male presenting with new-onset atrial fibrillation with CHF. He is not on any anticoagulation PTA. His CHADS2VASC score is 1 (new CHF).  IV heparin was held overnight 3/23-3/24 with large R MCA bifurcation aneurysm (no evidence to suggest rupture). Per discussion with Dr. Reesa Chew 3/24 AM, he would like to resume heparin and defer further recommendations to Cardiology and Neuro IR. Will target lower heparin level goal with aneurysm.  Patient is now S/P cath on 3/29. Pharmacy was consulted to resume heparin 8 hours post-sheath removal. Sheath removed at 1426. Will resume heparin at  previously therapeutic rate. CBC stable. No active bleed issues reported.  Cardiology coordinating with IR regarding timing of cardioversion and diagnostic angiogram since patient will need uninterrupted anticoagulation post-cardioversion. Pt is tentatively scheduled for image-guided diagnostic cerebral arteriogram in IR tomorrow. Due to aneurysm, Cardiology recommends delaying TEE/ cardioversion and using rate control and anticoagulation for a fib at this time.  Heparin level ~8 hrs after heparin infusion was increased to 1850 units/hr was 0.51 units/ml, which is at upper end of goal range for this pt. CBC WNL. Per RN, no issues with IV or bleeding observed.  Goal of Therapy:  Heparin level 0.3-0.5 units/ml Monitor platelets by anticoagulation protocol: Yes   Plan:  Decrease heparin infusion to 1800 units/hr Check heparin level in 6 hours Monitor daily heparin level and CBC Monitor for signs/symptoms of bleeding F/U after IR procedure tomorrow  Gillermina Hu, PharmD, BCPS, [facility] Clinical Pharmacist  11/13/2019 4:53 PM

## 2019-11-13 NOTE — Progress Notes (Signed)
PROGRESS NOTE    Richard Graham  GYF:[id number] DOB: 02-28-1956 DOA: 11/05/2019 PCP: No primary care provider on file.   Brief Narrative:  64 year old with history of prediabetes, obesity, gout admitted for worsening shortness of breath and lower extremity swelling. Found to be in atrial fibrillation, new diagnosis. Chest x-ray showed pulmonary edema. Cardiology team was consulted, started on diuretics, heparin drip and Cardizem drip. Suspected his hyponatremia was secondary to fluid overload.  3/30: Cardiac cath with old lesion to mid LAD with collaterals and EF 25% with mild pulmonary hypertension. Pursuing angiogram to intervene on R MCA 10 mm aneurysm today or tomorrow.   Assessment & Plan:   Principal Problem:   Acute CHF (congestive heart failure) (HCC) Active Problems:   Gout   Thrombocytopenia (HCC)   New onset a-fib (HCC)   Morbid obesity (HCC)   Hypervolemia   SOB (shortness of breath)  Acute congestive heart failure, reduced ejection fraction 20-25%. Ischemic cardiomyopathy - Echocardiogram showing EF 20-25%, akinetic apex.  - 2 g salt diet low fluid restriction - Continue lasix dose at 80 mg BID - Continue potassium 40 mg daily -Cardiology has done cardiac cath and no emergent issues  New onset atrial fibrillation - continue heparin drip/amiodarone oral - defer long-term plan to cards, they are considering TEE/DCCV depending on intervention to MCA aneurysm likely that will be deferred for now  Acute metabolic encephalopathy,resolved CO2 narcosis; improved.  Right MCA bifurcation aneurysm, 10 mm - CT of the head is negative for any acute bleed but coincidental finding of large right MCA bifurcation aneurysm, 10 mm. - Dicussed with Dr [name].Patient will require angiogram. - MRI brain and MRA headcomplete; see below - appreciate IR assistance, angiography today or tomorrow as cardiac situation stable   Thrombocytopenia - trend, no evidence of bleed - Stable  Hyponatremia - stable, monitor  Hypokalemia - Resolved. - Will need continued repletion given high dose lasix  - Continue 40 meq daily  DVT prophylaxis: heparin drip Code Status: full Family Communication: patient and wife at bedside Disposition Plan:  . Patient came from:home            . Anticipated d/c place:home . Barriers to d/c OR conditions which need to be met to effect a safe d/c: resolution of R MCA aneurysm today or tomorrow and likely outpatient cardiology follow up of A fib, depending on results from IR.    Consultants:   Cardiology  IR  Procedures:  Cath 11/12/19: mid LAD 100% stenosed, EEF 25%, mild pulm htn  Antimicrobials:   None  Subjective: Overall feeling well, has many questions about the course and timeline for when things will happen with heart and aneurysm. Denies chest pains, no food this morning or liquids. Denies abdominal pain, diarrhea, constipation.   Objective: Vitals:   11/13/19 0000 11/13/19 0100 11/13/19 0319 11/13/19 0831  BP:   103/75 103/79  Pulse:  81 96 98  Resp: '19  19 19  ' Temp: 97.6 F (36.4 C)  98.3 F (36.8 C) 97.8 F (36.6 C)  TempSrc: Oral  Oral Oral  SpO2:  93%  97%  Weight:   103.6 kg   Height:        Intake/Output Summary (Last 24 hours) at 11/13/2019 1115 Last data filed at 11/13/2019 0900 Gross per 24 hour  Intake 1025.86 ml  Output 4875 ml  Net -3849.14 ml   Filed Weights   11/11/19 0048 11/12/19 0556 11/13/19 0319  Weight: 110.7 kg 105 kg 103.6 kg  Examination:  General exam: Appears calm and comfortable, obese Respiratory system: Clear to auscultation. Respiratory effort normal. Cardiovascular system: S1 & S2 heard, irreg irreg. No JVD, murmurs, rubs, gallops or clicks. No pedal edema. Gastrointestinal system: Abdomen is nondistended, soft and nontender. No organomegaly or masses felt. Normal bowel sounds heard.  Central nervous system: Alert and oriented. No focal neurological deficits. Extremities: Symmetric 5 x 5 power. Skin: No rashes, lesions or ulcers Psychiatry: Judgement and insight appear normal. Mood & affect appropriate.   Data Reviewed: I have personally reviewed following labs and imaging studies  CBC: Recent Labs  Lab 11/08/19 2306 11/08/19 2306 11/09/19 0939 11/09/19 0939 11/11/19 0742 11/12/19 0514 11/12/19 1410 11/12/19 1413 11/13/19 0453  WBC 8.2  --  7.9  --  6.6 5.9  --   --  7.9  NEUTROABS 5.3  --   --   --   --   --   --   --   --   HGB 15.0   < > 14.3   < > 13.7 14.6 16.3 16.3 15.3  HCT 48.3   < > 45.9   < > 43.7 45.8 48.0 48.0 48.0  MCV 96.2  --  96.0  --  95.4 93.9  --   --  93.2  PLT 161  --  140*  --  131* 145*  --   --  177   < > = values in this interval not displayed.   Basic Metabolic Panel: Recent Labs  Lab 11/08/19 0439 11/08/19 0439 11/08/19 2306 11/08/19 2306 11/10/19 0505 11/11/19 0742 11/12/19 0514 11/12/19 1410 11/12/19 1413 11/13/19 0453  NA 133*   < > 132*   < > 133* 132*  --  132* 133* 135  K 3.4*   < > 3.8   < > 4.8 4.2  --  4.4 4.3 4.4  CL 82*  --  80*  --  85* 88*  --   --   --  90*  CO2 39*  --  41*  --  34* 31  --   --   --  34*  GLUCOSE 156*  --  168*  --  117* 100*  --   --   --  107*  BUN 8  --  11  --  13 14  --   --   --  28*  CREATININE 0.95  --  0.95  --  0.94 0.91  --   --   --  1.02  CALCIUM 8.6*  --  8.6*  --  9.1 9.1  --   --   --  9.8  MG 1.9  --  2.0  --  2.1 2.0 2.2  --   --   --   PHOS  --   --  3.4  --  3.8  --   --   --   --   --    < > = values in this interval not displayed.   GFR: Estimated Creatinine Clearance: 93.4 mL/min (by C-G formula based on SCr of 1.02 mg/dL). Liver Function Tests: Recent Labs  Lab 11/08/19 2306 11/10/19 0505  ALBUMIN 3.8 3.6   No results for input(s): LIPASE, AMYLASE in the last 168 hours. No results for input(s): AMMONIA in the last 168 hours. Coagulation Profile: No  results for input(s): INR, PROTIME in the last 168 hours. Cardiac Enzymes: No results for input(s): CKTOTAL, CKMB, CKMBINDEX, TROPONINI in the last 168 hours. BNP (last 3  results) No results for input(s): PROBNP in the last 8760 hours. HbA1C: No results for input(s): HGBA1C in the last 72 hours. CBG: Recent Labs  Lab 11/06/19 1737 11/11/19 1212 11/11/19 1621 11/12/19 1630  GLUCAP 127* 155* 123* 152*   Lipid Profile: No results for input(s): CHOL, HDL, LDLCALC, TRIG, CHOLHDL, LDLDIRECT in the last 72 hours. Thyroid Function Tests: No results for input(s): TSH, T4TOTAL, FREET4, T3FREE, THYROIDAB in the last 72 hours. Anemia Panel: No results for input(s): VITAMINB12, FOLATE, FERRITIN, TIBC, IRON, RETICCTPCT in the last 72 hours. Sepsis Labs: No results for input(s): PROCALCITON, LATICACIDVEN in the last 168 hours.  Recent Results (from the past 240 hour(s))  SARS CORONAVIRUS 2 (TAT 6-24 HRS) Nasopharyngeal Nasopharyngeal Swab     Status: None   Collection Time: 11/05/19  6:56 PM   Specimen: Nasopharyngeal Swab  Result Value Ref Range Status   SARS Coronavirus 2 NEGATIVE NEGATIVE Final    Comment: (NOTE) SARS-CoV-2 target nucleic acids are NOT DETECTED. The SARS-CoV-2 RNA is generally detectable in upper and lower respiratory specimens during the acute phase of infection. Negative results do not preclude SARS-CoV-2 infection, do not rule out co-infections with other pathogens, and should not be used as the sole basis for treatment or other patient management decisions. Negative results must be combined with clinical observations, patient history, and epidemiological information. The expected result is Negative. Fact Sheet for Patients: SugarRoll.be Fact Sheet for Healthcare Providers: https://www.woods-mathews.com/ This test is not yet approved or cleared by the [place] FDA and  has been authorized for detection and/or diagnosis  of SARS-CoV-2 by FDA under an Emergency Use Authorization (EUA). This EUA will remain  in effect (meaning this test can be used) for the duration of the COVID-19 declaration under Section 56 4(b)(1) of the Act, 21 U.S.C. section 360bbb-3(b)(1), unless the authorization is terminated or revoked sooner. Performed at [facility], [place] [address], [place], [place] [address]     Radiology Studies: CARDIAC CATHETERIZATION  Result Date: 11/12/2019  Mid LAD lesion is 100% stenosed. Chronically occluded with left to left collaterals.  LV end diastolic pressure is mildly elevated.  There is severe left ventricular systolic dysfunction.  The left ventricular ejection fraction is less than 25% by visual estimate.  There is no aortic valve stenosis.  Hemodynamic findings consistent with mild pulmonary hypertension.  Consider viability study of anterior wall.  If viable, would plan for CTO PCI of LAD.  If no chance of recovery, then medical therapy without revascularization. Explained to wife, Beth [phone]   Scheduled Meds: . amiodarone  200 mg Oral BID  . aspirin EC  81 mg Oral Daily  . furosemide  80 mg Intravenous BID  . metoprolol tartrate  50 mg Oral BID  . potassium chloride  40 mEq Oral Daily  . sodium chloride flush  3 mL Intravenous Q12H  . sodium chloride flush  3 mL Intravenous Q12H  . ticagrelor  180 mg Oral Once  . ticagrelor  90 mg Oral BID   Continuous Infusions: . sodium chloride    . heparin 1,850 Units/hr (11/13/19 0732)    LOS: 8 days   Time spent: [place], MD [facility]  To contact the attending provider between 7A-7P or the covering provider during after hours 7P-7A, please log into the web site [email or web address] and access using universal [facility] password for that web site. If you do not have the password, please call the hospital operator.  11/13/2019, 11:15 AM

## 2019-11-13 NOTE — Progress Notes (Signed)
ANTICOAGULATION CONSULT NOTE - Follow Up Consult  Pharmacy Consult for Heparin Indication: atrial fibrillation  No Known Allergies  Patient Measurements: Height: 6\' 4"  (193 cm) Weight: 228 lb 6.3 oz (103.6 kg) IBW/kg (Calculated) : 86.8 Heparin Dosing Weight: 103.5  Vital Signs: Temp: 98.3 F (36.8 C) (03/30 0319) Temp Source: Oral (03/30 0319) BP: 103/75 (03/30 0319) Pulse Rate: 96 (03/30 0319)  Labs: Recent Labs    11/11/19 0742 11/11/19 0742 11/11/19 1636 11/12/19 0514 11/12/19 0514 11/12/19 1410 11/12/19 1410 11/12/19 1413 11/13/19 0453  HGB 13.7   < >  --  14.6   < > 16.3   < > 16.3 15.3  HCT 43.7   < >  --  45.8   < > 48.0  --  48.0 48.0  PLT 131*  --   --  145*  --   --   --   --  177  HEPARINUNFRC 0.27*   < > 0.31 0.46  --   --   --   --  0.24*  CREATININE 0.91  --   --   --   --   --   --   --  1.02   < > = values in this interval not displayed.    Estimated Creatinine Clearance: 93.4 mL/min (by C-G formula based on SCr of 1.02 mg/dL).   Medical History: Past Medical History:  Diagnosis Date  . CHF (congestive heart failure) (HCC)   . Gout   . New onset atrial fibrillation [facility])     Assessment: Richard Graham is a 64 yr old male presenting with new-onset atrial fibrillation with CHF. He is not on any anticoagulation PTA. His CHADS2VASC score is 1 (new CHF).  IV heparin was held overnight 3/23-3/24 with large R MCA bifurcation aneurysm (no evidence to suggest rupture). Per discussion with Dr. 11-19-1969 3/24 AM, he would like to resume heparin and defer further recommendations to Cardiology and Neuro IR. Will target lower heparin level goal with aneurysm.  Patient now s/p cath 3/29. Pharmacy consulted to resume heparin 8 hours post-sheath removal. Sheath removed at 1426. Will resume heparin at previously therapeutic rate. CBC stable. No active bleed issues reported.  Cardiology coordinating with IR regarding timing of cardioversion and diagnostic angiogram  since patient will need uninterrupted anticoagulation post-cardioversion  AM heparin level low  Goal of Therapy:  Heparin level 0.3-0.5 units/ml Monitor platelets by anticoagulation protocol: Yes   Plan:  Increase heparin to 1850 units / hr Heparin level in 6 hours Monitor daily heparin level and CBC, s/sx bleeding F/u Cardiology and IR plans for further procedures  4/29, PharmD Please check AMION for all [facility] contact numbers Clinical Pharmacist 11/13/2019 6:39 AM

## 2019-11-13 NOTE — Progress Notes (Signed)
  ReDS Clip Diuretic Study Pt study # [id number]  Your patient has been enrolled in the ReDS Clip Diuretic Study   Changes to prescribed diuretics recommended:  Continue lasix 80 mg IV BID.  Weight down 3 lb, uop 4.8 L/24 hr, SCr stable at 1.02.  Provider contacted: Dr. Tenny Craw  REDS Clip  READING= 43%  CHEST RULER = 38 Clip Station = D   Orthodema score = 1 Signs/Symptoms Score   Mild edema, no orthopnea 0 No congestion  Moderate edema, no orthopnea 1 Low-grade orthodema/congestion  Severe edema OR orthopnea 2   Moderate edema and orthopnea 3 High-grade orthodema/congestion  Severe edema AND orthopnea 4    Richard Graham, PharmD, BCCCP Clinical Pharmacist  Phone: [phone]  Please check AMION for all [facility] Pharmacy phone numbers After 10:00 PM, call Main Pharmacy [phone] 11/13/2019       7:39 AM

## 2019-11-14 ENCOUNTER — Inpatient Hospital Stay (HOSPITAL_COMMUNITY): Payer: Commercial Managed Care - PPO

## 2019-11-14 HISTORY — PX: IR 3D INDEPENDENT WKST: IMG2385

## 2019-11-14 HISTORY — PX: IR ANGIO INTRA EXTRACRAN SEL COM CAROTID INNOMINATE BILAT MOD SED: IMG5360

## 2019-11-14 HISTORY — PX: IR ANGIO VERTEBRAL SEL VERTEBRAL BILAT MOD SED: IMG5369

## 2019-11-14 LAB — BASIC METABOLIC PANEL
Anion gap: 14 (ref 5–15)
BUN: 26 mg/dL — ABNORMAL HIGH (ref 8–23)
CO2: 32 mmol/L (ref 22–32)
Calcium: 9.7 mg/dL (ref 8.9–10.3)
Chloride: 90 mmol/L — ABNORMAL LOW (ref 98–111)
Creatinine, Ser: 1.12 mg/dL (ref 0.61–1.24)
GFR calc Af Amer: >60 mL/min (ref >60)
GFR calc non Af Amer: >60 mL/min (ref >60)
Glucose, Bld: 119 mg/dL — ABNORMAL HIGH (ref 70–99)
Potassium: 3.8 mmol/L (ref 3.5–5.1)
Sodium: 136 mmol/L (ref 135–145)

## 2019-11-14 LAB — CBC
HCT: 51.1 % (ref 39.0–52.0)
Hemoglobin: 16.2 g/dL (ref 13.0–17.0)
MCH: 30.1 pg (ref 26.0–34.0)
MCHC: 31.7 g/dL (ref 30.0–36.0)
MCV: 94.8 fL (ref 80.0–100.0)
Platelets: 183 10*3/uL (ref 150–400)
RBC: 5.39 MIL/uL (ref 4.22–5.81)
RDW: 14.1 % (ref 11.5–15.5)
WBC: 8.8 10*3/uL (ref 4.0–10.5)
nRBC: 0 % (ref 0.0–0.2)

## 2019-11-14 LAB — POCT ACTIVATED CLOTTING TIME: Activated Clotting Time: 153 seconds

## 2019-11-14 LAB — PLATELET INHIBITION P2Y12: Platelet Function  P2Y12: 7 [PRU] — ABNORMAL LOW (ref 182–335)

## 2019-11-14 LAB — HEPARIN LEVEL (UNFRACTIONATED)
Heparin Unfractionated: 0.65 IU/mL (ref 0.30–0.70)
Heparin Unfractionated: 0.32 IU/mL (ref 0.30–0.70)

## 2019-11-14 LAB — MAGNESIUM: Magnesium: 2.3 mg/dL (ref 1.7–2.4)

## 2019-11-14 IMAGING — XA IR ANGIO INTRA EXTRACRAN SEL COM CAROTID INNOMINATE BILAT MOD SE
1 of 2 series · 12 of 24 positions shown · IV contrast (IODINE)
Comparison: MRI MRA examination [DATE].

CLINICAL DATA: Discovery of a right middle cerebral artery aneurysm
on workup for encephalopathy.

EXAM:
BILATERAL COMMON CAROTID AND INNOMINATE ANGIOGRAPHY
TECHNIQUE: Informed written consent was obtained from the patient after a
thorough discussion of the procedural risks, benefits and
alternatives. All questions were addressed. Maximal Sterile Barrier
Technique was utilized including caps, mask, sterile gowns, sterile
gloves, sterile drape, hand hygiene and skin antiseptic. A timeout
was performed prior to the initiation of the procedure.

[Series 300: dr. (person_name) · 12 of 232 slices shown]
[im 1/232]
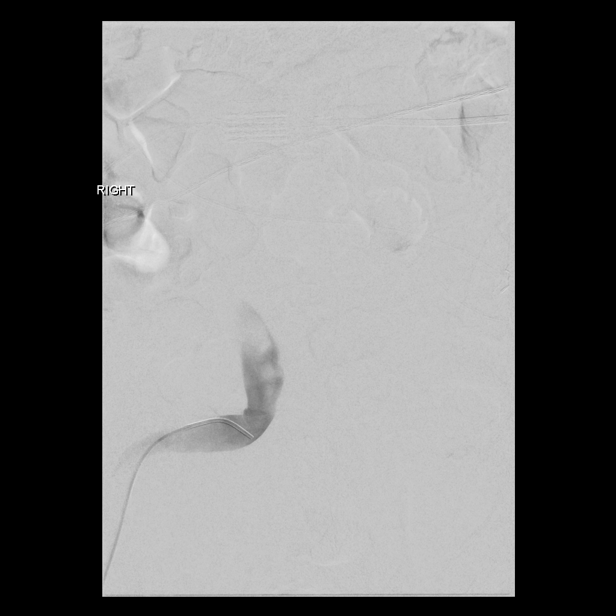
[im 22/232]
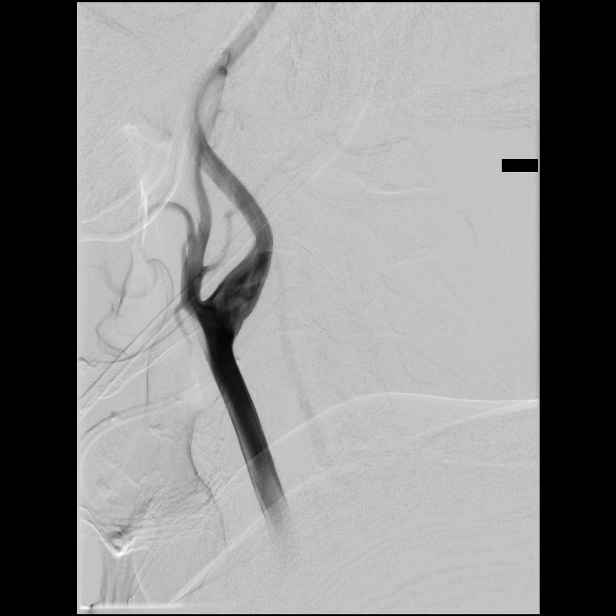
[im 43/232]
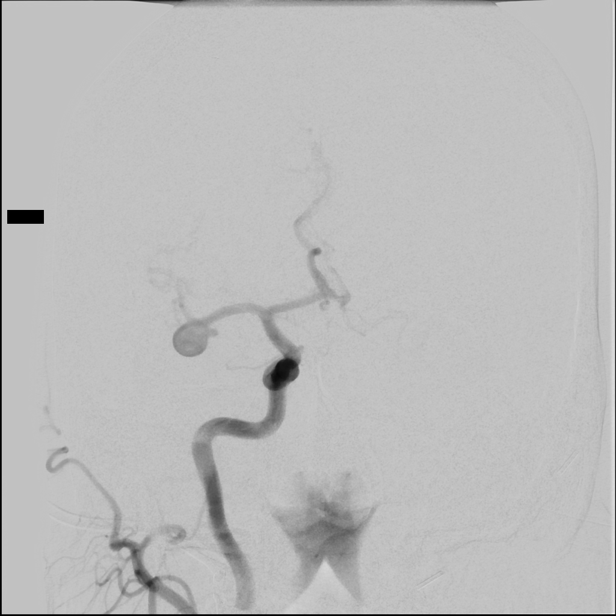
[im 64/232]
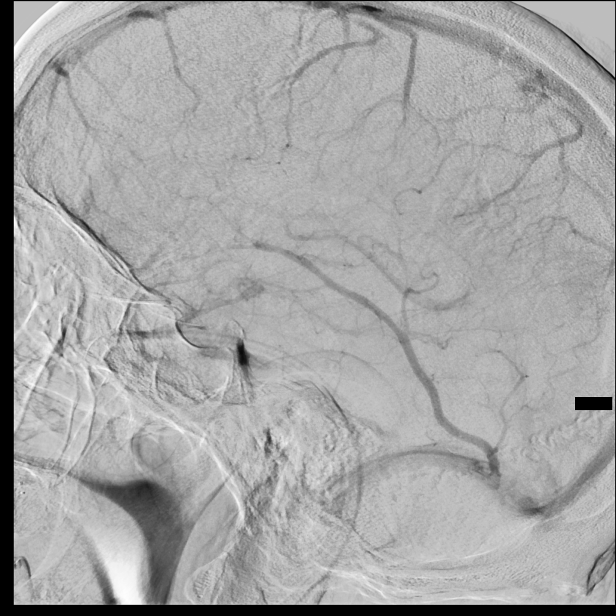
[im 85/232]
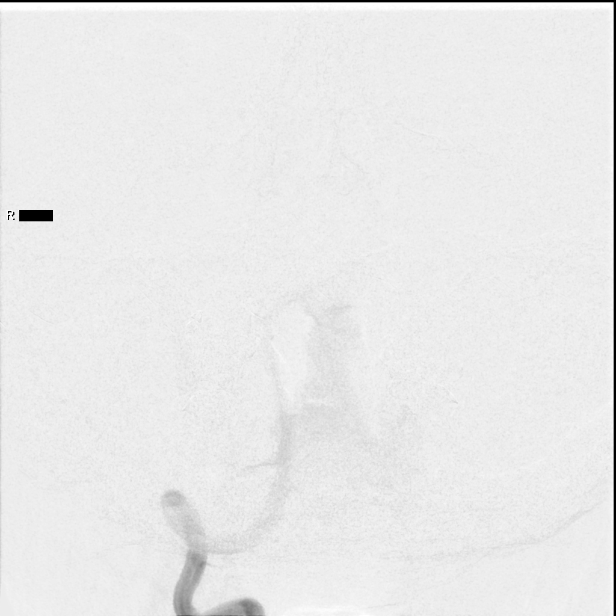
[im 106/232]
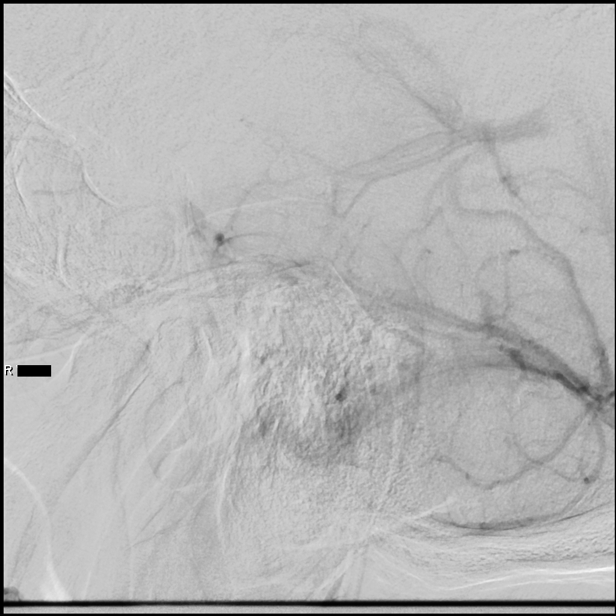
[im 127/232]
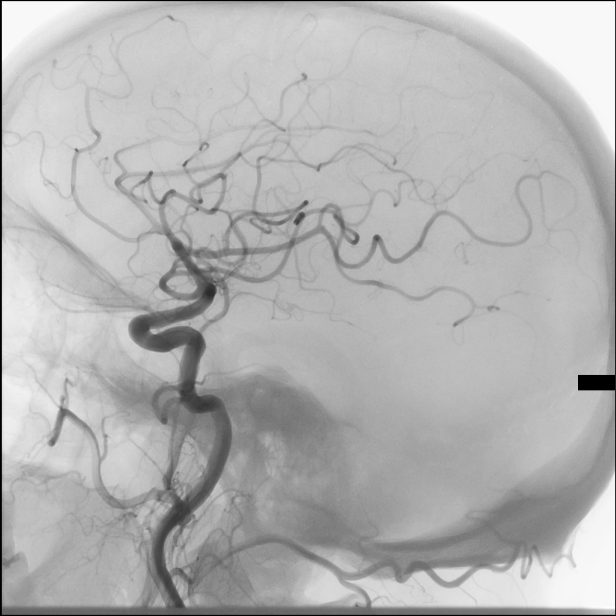
[im 148/232]
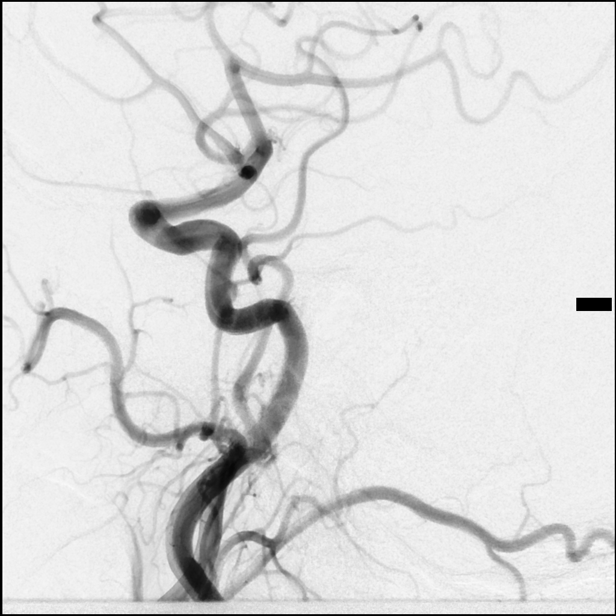
[im 169/232]
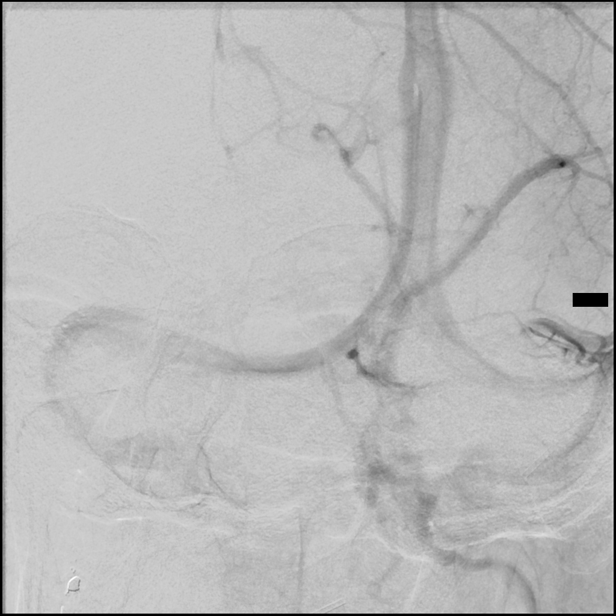
[im 190/232]
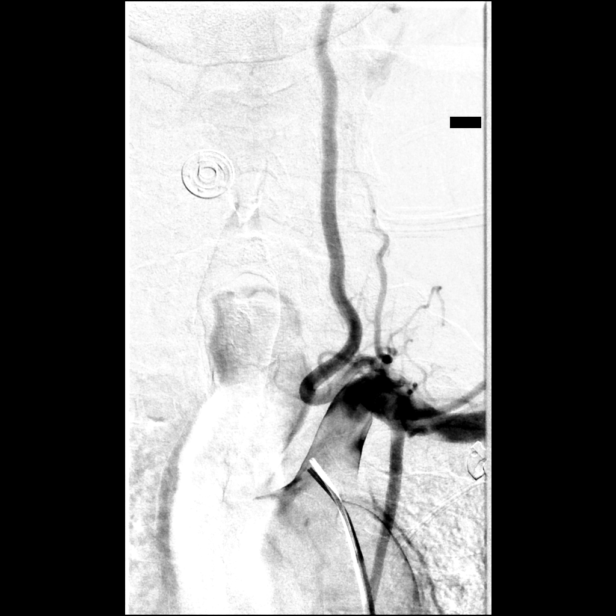
[im 211/232]
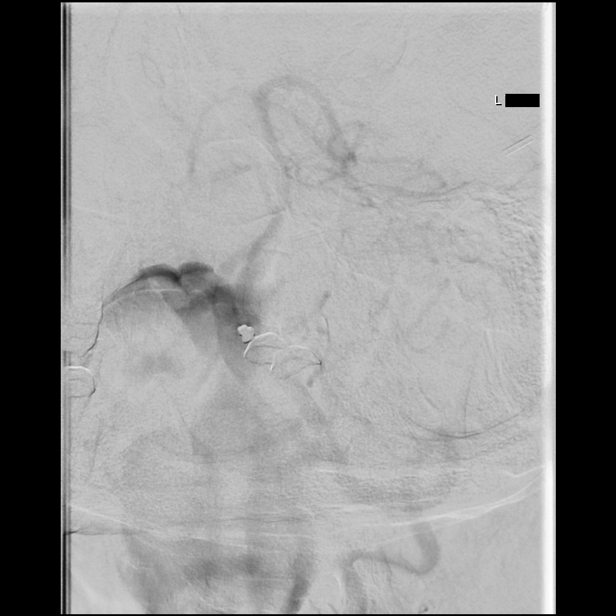
[im 232/232]
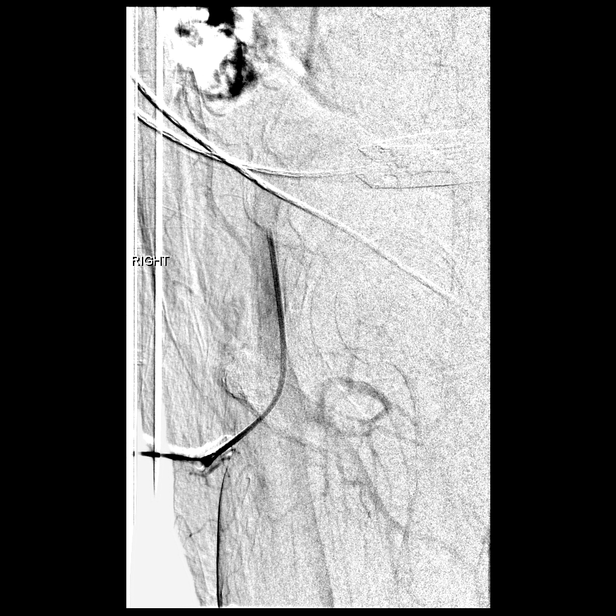

[12 of 24 positions shown; findings below may reference images not displayed]

MEDICATIONS:
Heparin [XM] units IV. No antibiotic was administered within 1 hour
of the procedure.

ANESTHESIA/SEDATION:
Versed 0.5 mg IV; Fentanyl 12.5 mcg IV

Moderate Sedation Time:  48 minutes

The patient was continuously monitored during the procedure by the
interventional radiology nurse under my direct supervision.

CONTRAST:  Isovue 300 approximately 90 mL.

FLUOROSCOPY TIME:  Fluoroscopy Time: 11 minutes 30 seconds ([XM]
mGy).

COMPLICATIONS:
None immediate.
The right groin was prepped and draped in the usual sterile fashion.
Thereafter using modified Seldinger technique, transfemoral access
into the right common femoral artery was obtained without
difficulty. Over a 0.035 inch guidewire, a 5 French Pinnacle sheath
was inserted. Through this, and also over 0.035 inch guidewire, a 5
French JB 1 catheter was advanced to the aortic arch region and
selectively positioned in the right common carotid artery, the right
vertebral artery, the left common carotid artery and the left
vertebral artery. Also performed was a 3D rotational arteriogram of
the right anterior circulation via the right common carotid artery
injection with subsequent 3D reconstruction.
FINDINGS: The innominate arteriogram demonstrates the right subclavian artery
and the right common carotid artery to be widely patent.

The right common carotid arteriogram demonstrates the right external
carotid artery and its major branches to be widely patent.

The right internal carotid artery at the bulb to the cranial skull
base demonstrates wide patency.

The right middle cerebral artery and the right anterior cerebral
artery opacify into the capillary and venous phases. Mild
atherosclerotic disease is seen in the caval cavernous segment of
the right internal carotid artery.

Arising in the right middle cerebral artery bifurcation region is a
large saccular aneurysm with a wide neck projecting inferiorly and
slightly laterally.

A 3D rotational and imaging with subsequent reconstruction on a
separate workstation confirms the presence of an approximately 8 mm
x 8.7 mm saccular aneurysm with a wide neck.

The superior and inferior divisions of the right middle cerebral
artery arise at the neck of the aneurysm.

Cross-filling via the anterior communicating artery of the left
anterior cerebral A2 segment is noted.

The origin the right vertebral artery is widely patent.

The vessel is seen to opacify to the cranial skull base. Wide
patency is seen of the right vertebrobasilar junction and the right
posterior-inferior cerebellar artery.

The opacified portion of the basilar artery, the posterior cerebral
arteries, the superior cerebellar arteries and anterior-inferior
cerebellar arteries is seen into the capillary and venous phases.
Unopacified blood is seen in the proximal basilar artery from the
contralateral vertebral artery.

The left common carotid arteriogram demonstrates the origin of the
left external carotid artery and its major branches to be widely
patent.

The left internal carotid artery at the bulb to the cranial skull
base is widely patent.

The petrous, cavernous and the supraclinoid segments are widely
patent.

The left middle cerebral artery and the left anterior cerebral
artery opacify into the capillary and venous phases. Cross-filling
via the anterior communicating artery of the right anterior cerebral
A2 segment is seen.

The left vertebral artery origin is widely patent.

The vessel has a moderate tortuous course in its proximal aspect.
More distally the vessel is seen to opacify to the left
vertebrobasilar junction. Opacification of the left
posterior-inferior cerebellar artery is seen. The opacified portion
of the basilar artery, the posterior cerebral arteries, the superior
cerebellar arteries is seen into the capillary and venous phases.
Unopacified blood is seen in the basilar artery from the
contralateral vertebral artery.
IMPRESSION: Approximately 8 mm x 8.7 mm wide neck saccular aneurysm arising in
the right MCA bifurcation region.

PLAN:
Findings reviewed with the patient and the patient's referring
physician. Endovascular treatment options considered and agreed
upon. Endovascular treatment will be scheduled as soon as possible.

## 2019-11-14 MED ORDER — HEPARIN SODIUM (PORCINE) 1000 UNIT/ML IJ SOLN
INTRAMUSCULAR | Status: AC
  Filled 2019-11-14: qty 1

## 2019-11-14 MED ORDER — IOHEXOL 300 MG/ML  SOLN
100.0000 mL | Freq: Once | INTRAMUSCULAR | Status: AC | PRN
  Administered 2019-11-14: 42 mL via INTRA_ARTERIAL

## 2019-11-14 MED ORDER — SODIUM CHLORIDE 0.9 % IV SOLN: INTRAVENOUS | Status: AC

## 2019-11-14 MED ORDER — LIDOCAINE HCL 1 % IJ SOLN
INTRAMUSCULAR | Status: AC
  Filled 2019-11-14: qty 20

## 2019-11-14 MED ORDER — MIDAZOLAM HCL 2 MG/2ML IJ SOLN
INTRAMUSCULAR | Status: AC | PRN
  Administered 2019-11-14: 0.5 mg via INTRAVENOUS

## 2019-11-14 MED ORDER — TICAGRELOR 90 MG PO TABS
45.0000 mg | ORAL_TABLET | Freq: Two times a day (BID) | ORAL | Status: DC
  Administered 2019-11-14 – 2019-11-15 (×2): 45 mg via ORAL
  Filled 2019-11-14 (×3): qty 1

## 2019-11-14 MED ORDER — TEMAZEPAM 7.5 MG PO CAPS
30.0000 mg | ORAL_CAPSULE | Freq: Every evening | ORAL | Status: DC | PRN
  Administered 2019-11-14 – 2019-11-15 (×3): 30 mg via ORAL
  Filled 2019-11-14 (×3): qty 4

## 2019-11-14 MED ORDER — HEPARIN SODIUM (PORCINE) 1000 UNIT/ML IJ SOLN
INTRAMUSCULAR | Status: AC | PRN
  Administered 2019-11-14: 1000 [IU] via INTRAVENOUS

## 2019-11-14 MED ORDER — IOHEXOL 300 MG/ML  SOLN
150.0000 mL | Freq: Once | INTRAMUSCULAR | Status: AC | PRN
  Administered 2019-11-14: 84 mL via INTRA_ARTERIAL

## 2019-11-14 MED ORDER — ROSUVASTATIN CALCIUM 5 MG PO TABS
10.0000 mg | ORAL_TABLET | Freq: Every day | ORAL | Status: DC
  Administered 2019-11-14: 10 mg via ORAL
  Filled 2019-11-14: qty 2

## 2019-11-14 MED ORDER — FENTANYL CITRATE (PF) 100 MCG/2ML IJ SOLN
INTRAMUSCULAR | Status: AC
  Filled 2019-11-14: qty 2

## 2019-11-14 MED ORDER — FENTANYL CITRATE (PF) 100 MCG/2ML IJ SOLN
INTRAMUSCULAR | Status: AC | PRN
  Administered 2019-11-14: 12.5 ug via INTRAVENOUS

## 2019-11-14 MED ORDER — MIDAZOLAM HCL 2 MG/2ML IJ SOLN
INTRAMUSCULAR | Status: AC
  Filled 2019-11-14: qty 2

## 2019-11-14 MED ORDER — LIDOCAINE HCL (PF) 1 % IJ SOLN
INTRAMUSCULAR | Status: AC | PRN
  Administered 2019-11-14: 10 mL

## 2019-11-14 NOTE — Progress Notes (Signed)
NIR Brief Note:  P2Y12 7 PRU this AM. Discussed result with Dr. Corliss Skains who recommends patient begin taking Brilinta 45 mg twice daily. Continue taking Aspirin 81 mg once daily. Will check P2Y12 tomorrow AM. Orders updated.  Plan for image-guided diagnostic cerebral arteriogram today with Dr. Corliss Skains.  Please call NIR with questions/concerns.   Waylan Boga Torrey Horseman, PA-C 11/14/2019, 8:40 AM

## 2019-11-14 NOTE — Plan of Care (Signed)

## 2019-11-14 NOTE — Progress Notes (Signed)
RN has notified MD via Amion of pt elevated heart rate.  Pt denies any symptoms during this time.  BP stable during this time.

## 2019-11-14 NOTE — Procedures (Signed)
S/P 4 vessel c erebral arteriogram Rt CFA approach. Findings. 1.Approx  8,44mm x 8 mm RT MCA bifurcation aneurysm with a mod sized neck.Marland Kitchen S.Ramadan Couey MD

## 2019-11-14 NOTE — Progress Notes (Signed)
Progress Note  Patient Name: Richard Graham Date of Encounter: 11/14/2019  Primary Cardiologist: Fransico Him, MD   Subjective    Patient sleeping soundly     Wife in room    Inpatient Medications    Scheduled Meds: . amiodarone  200 mg Oral BID  . aspirin EC  81 mg Oral Daily  . furosemide  80 mg Intravenous BID  . metoprolol tartrate  50 mg Oral BID  . potassium chloride  40 mEq Oral Daily  . sodium chloride flush  3 mL Intravenous Q12H  . sodium chloride flush  3 mL Intravenous Q12H  . ticagrelor  90 mg Oral BID   Continuous Infusions: . sodium chloride    . heparin 1,700 Units/hr (11/14/19 0238)   PRN Meds: sodium chloride, acetaminophen **OR** acetaminophen, acetaminophen, metoprolol tartrate, ondansetron **OR** ondansetron (ZOFRAN) IV, ondansetron (ZOFRAN) IV, oxyCODONE-acetaminophen **AND** oxyCODONE, sodium chloride flush, temazepam   Vital Signs    Vitals:   11/14/19 0632 11/14/19 0633 11/14/19 0634 11/14/19 0635  BP:  (!) 70/59 91/68   Pulse: 79 (!) 42 (!) 35 62  Resp:      Temp:    97.7 F (36.5 C)  TempSrc:    Oral  SpO2: 97% 97% 96% 98%  Weight:    100.6 kg  Height:        Intake/Output Summary (Last 24 hours) at 11/14/2019 0717 Last data filed at 11/14/2019 0400 Gross per 24 hour  Intake 1422.43 ml  Output 3350 ml  Net -1927.57 ml   Net negative  32  L  Last 3 Weights 11/14/2019 11/13/2019 11/12/2019  Weight (lbs) 221 lb 12.8 oz 228 lb 6.3 oz 231 lb 7.7 oz  Weight (kg) 100.608 kg 103.6 kg 105 kg      Telemetry    Afib 80s  - Personally Reviewed  ECG    No new ECG tracing today. - Personally Reviewed  Physical Exam   GEN: No acute distress.   Neck: Supple. Difficult to assess JVP is not elevated      Cardiac: Irregularly irregular rhythm, normal rate.  Respiratory:  CTA   GI: Soft, non-distended, and non-tender.  MS:  No signif LE edema   Labs    High Sensitivity Troponin:   Recent Labs  Lab 11/05/19 1731 11/05/19 1822  11/05/19 2053  TROPONINIHS 11 14 12       Chemistry Recent Labs  Lab 11/08/19 2306 11/08/19 2306 11/10/19 0505 11/10/19 0505 11/11/19 0742 11/11/19 0742 11/12/19 1410 11/12/19 1413 11/13/19 0453  NA 132*   < > 133*   < > 132*   < > 132* 133* 135  K 3.8   < > 4.8   < > 4.2   < > 4.4 4.3 4.4  CL 80*   < > 85*  --  88*  --   --   --  90*  CO2 41*   < > 34*  --  31  --   --   --  34*  GLUCOSE 168*   < > 117*  --  100*  --   --   --  107*  BUN 11   < > 13  --  14  --   --   --  28*  CREATININE 0.95   < > 0.94  --  0.91  --   --   --  1.02  CALCIUM 8.6*   < > 9.1  --  9.1  --   --   --  9.8  ALBUMIN 3.8  --  3.6  --   --   --   --   --   --   GFRNONAA >60   < > >60  --  >60  --   --   --  >60  GFRAA >60   < > >60  --  >60  --   --   --  >60  ANIONGAP 11   < > 14  --  13  --   --   --  11   < > = values in this interval not displayed.     Hematology Recent Labs  Lab 11/12/19 0514 11/12/19 1410 11/12/19 1413 11/13/19 0453 11/14/19 0615  WBC 5.9  --   --  7.9 8.8  RBC 4.88  --   --  5.15 5.39  HGB 14.6   < > 16.3 15.3 16.2  HCT 45.8   < > 48.0 48.0 51.1  MCV 93.9  --   --  93.2 94.8  MCH 29.9  --   --  29.7 30.1  MCHC 31.9  --   --  31.9 31.7  RDW 14.1  --   --  14.0 14.1  PLT 145*  --   --  177 183   < > = values in this interval not displayed.    BNP No results for input(s): BNP, PROBNP in the last 168 hours.   DDimer No results for input(s): DDIMER in the last 168 hours.   Radiology    CARDIAC CATHETERIZATION  Result Date: 11/12/2019  Mid LAD lesion is 100% stenosed. Chronically occluded with left to left collaterals.  LV end diastolic pressure is mildly elevated.  There is severe left ventricular systolic dysfunction.  The left ventricular ejection fraction is less than 25% by visual estimate.  There is no aortic valve stenosis.  Hemodynamic findings consistent with mild pulmonary hypertension.  Consider viability study of anterior wall.  If viable, would  plan for CTO PCI of LAD.  If no chance of recovery, then medical therapy without revascularization. Explained to wife, Richard Graham [phone]    Cardiac Studies   L heart cath  11/12/19   Mid LAD lesion is 100% stenosed. Chronically occluded with left to left collaterals.  LV end diastolic pressure is mildly elevated.  There is severe left ventricular systolic dysfunction.  The left ventricular ejection fraction is less than 25% by visual estimate.  There is no aortic valve stenosis.  Hemodynamic findings consistent with mild pulmonary hypertension.   Consider viability study of anterior wall.  If viable, would plan for CTO PCI of LAD.  If no chance of recovery, then medical therapy without revascularization.    Echocardiogram 11/06/2019: Impressions: 1. LV function is severely reduced, 20-25%. The mid anteroseptum into  apex is akinetic, almost aneurysmal, consistent with prior LAD infarction  (Q waves on EKG). The myocardium is thinned, futher supporting prior  infarction. On contrast imaging, there  is no LV thrombus seen.  2. Left ventricular ejection fraction, by estimation, is 20 to 25%. The  left ventricle has severely decreased function. The left ventricle  demonstrates regional wall motion abnormalities (see scoring  diagram/findings for description). There is mild  concentric left ventricular hypertrophy. Left ventricular diastolic  function could not be evaluated.  3. Right ventricular systolic function is mildly reduced. The right  ventricular size is normal. There is mildly elevated pulmonary artery  systolic pressure. The estimated right ventricular systolic pressure is  42.2 mmHg.  4. Left atrial size was mildly dilated.  5. The mitral valve is grossly normal. Trivial mitral valve  regurgitation. No evidence of mitral stenosis.  6. The aortic valve is tricuspid. Aortic valve regurgitation is not  visualized. No aortic stenosis is present.    Conclusion(s)/Recommendation(s): Findings consistent with ischemic  cardiomyopathy.  Patient Profile   Richard Graham is a 64 y.o. male with a history of pre-diabetes and obesity who is being seen for evaluation of new onset atrial fibrillation and CHF at the request of Dr. Jacqulyn Graham.   Assessment & Plan       New Onset Acute Systolic CHF  Left heart cath results above with LAD 100% occluded   Pt had event of pain  Remote  Territory  Fills via left to left collaterals  May have viability in region but no CP       REcomm:  Medical Rx for now   In time, if worsening CHF, would consider viability evaluation with possible CTO attempt if viable    Hold since no chest pain and aneurysm intervention planned ReDS shows volume is up   He is comfortable   For now preprocedure (IR) will nott plan to address but do so after        Atrial fibrillation  Rates controlled   For now this is the plan   Once he has IR procedure done cnsider elective cardioverson as outpt   R MCA aneurysm: I have reviewed with IR   Plan would be to intervene now with use of antiplt agents (in additon to anticoagulants) after      HL  Lipids from 3/25 LDL 56  HDL 40  Was not on statin   Will start in AM      For questions or updates, please contact [facility] Please consult [email or web address] for contact info under      Signed, Dietrich Pates, MD  11/14/2019, 7:17 AM

## 2019-11-14 NOTE — Progress Notes (Signed)
ANTICOAGULATION CONSULT NOTE - Follow Up Consult  Pharmacy Consult for Heparin Indication: atrial fibrillation  No Known Allergies  Patient Measurements: Height: 6\' 4"  (193 cm) Weight: 221 lb 12.8 oz (100.6 kg) IBW/kg (Calculated) : 86.8 Heparin Dosing Weight: 103.5 kg  Vital Signs: Temp: 98.5 F (36.9 C) (03/31 1942) Temp Source: Oral (03/31 1942) BP: 102/89 (03/31 1942) Pulse Rate: 62 (03/31 1942)  Labs: Recent Labs    11/12/19 0514 11/12/19 0514 11/12/19 1410 11/12/19 1413 11/13/19 0453 11/13/19 1012 11/13/19 1548 11/13/19 2311 11/14/19 0615 11/14/19 2054  HGB 14.6   < >   < > 16.3 15.3  --   --   --  16.2  --   HCT 45.8  --    < > 48.0 48.0  --   --   --  51.1  --   PLT 145*  --   --   --  177  --   --   --  183  --   LABPROT  --   --   --   --   --  14.0  --   --   --   --   INR  --   --   --   --   --  1.1  --   --   --   --   HEPARINUNFRC 0.46   < >  --   --  0.24*  --    < > 0.60 0.65 0.32  CREATININE  --   --   --   --  1.02  --   --   --  1.12  --    < > = values in this interval not displayed.    Estimated Creatinine Clearance: 85 mL/min (by C-G formula based on SCr of 1.12 mg/dL).   Medical History: Past Medical History:  Diagnosis Date  . CHF (congestive heart failure) (HCC)   . Gout   . New onset atrial fibrillation [facility])     Assessment: Richard Graham is a 64 yr old male presenting with new-onset atrial fibrillation with CHF. He is not on any anticoagulation PTA. His CHADS2VASC score is 1 (new CHF).  IV heparin was held overnight 3/23-3/24 with large R MCA bifurcation aneurysm (no evidence to suggest rupture). Per discussion with Dr. 11-19-1969 3/24 AM, he would like to resume heparin and defer further recommendations to Cardiology and Neuro IR. Will target lower heparin level goal with aneurysm.  Patient is now S/P cath on 3/29. Pharmacy was consulted to resume heparin 8 hours post-sheath removal. Sheath removed at 1426. Will resume heparin at  previously therapeutic rate. CBC stable. No active bleed issues reported.  Cardiology coordinating with IR regarding timing of cardioversion and diagnostic angiogram since patient will need uninterrupted anticoagulation post-cardioversion.   Pt is S/P 4-vessel cerebral arteriogram this afternoon, with finding of R MCA bifurcation aneurysm. Heparin infusion at 1500 units/hr was restarted at 15:00 PM today after procedure. Heparin level ~6 hrs after restarting infusion was 0.32 units/ml, which is within the goal range for this pt. CBC WNL. Per RN, no issues with IV or bleeding observed.  Goal of Therapy:  Heparin level 0.3-0.5 units/ml Monitor platelets by anticoagulation protocol: Yes   Plan:  Continue heparin at 1500 units/hr Check confirmatory heparin level in 6 hours Monitor daily heparin level, CBC Monitor for signs/symptoms of bleeding F/U plans for procedures (IR, Cardiology)  4/29, PharmD, BCPS, [facility] Clinical Pharmacist 11/14/2019 9:47 PM

## 2019-11-14 NOTE — Sedation Documentation (Signed)
5 french exoseal deployed 

## 2019-11-14 NOTE — Progress Notes (Signed)
Referring Physician(s): Amoin, Ankit Chirag; Fay Records  Supervising Physician: Luanne Bras  Patient Status:  Richard Graham [facility] - In-pt  Chief Complaint: None  Subjective:  Right MCA bifurcation aneurysm, confirmed on diagnostic cerebral arteriogram today with Dr. [name]. Patient awake and alert laying in bed with no complaints at this time. Accompanied by wife at bedside. Right groin incision c/d/i.   Allergies: Patient has no known allergies.  Medications: Prior to Admission medications   Medication Sig Start Date End Date Taking? Authorizing Provider  aspirin (ASPIRIN EC) 81 MG EC tablet Take 81 mg by mouth daily.   Yes [provider]  colchicine 0.6 MG tablet Take 0.6 mg by mouth 2 (two) times daily as needed (for gout flares).    Yes [provider]  diclofenac sodium (VOLTAREN) 1 % GEL Apply 2 g topically 2 (two) times daily as needed (to affected sites- for arthritis pain).    Yes [provider]  Omega-3 Fatty Acids (FISH OIL) 1200 MG CAPS Take 1,200 mg by mouth every other day.   Yes [provider]  oxyCODONE-acetaminophen (PERCOCET) 10-325 MG tablet Take 1 tablet by mouth every 4 (four) hours as needed for pain. 06/27/15  Yes Ralene Ok, MD  oxyCODONE-acetaminophen (ROXICET) 5-325 MG tablet Take 1-2 tablets by mouth every 4 (four) hours as needed. Patient not taking: Reported on 11/05/2019 06/27/15   Ralene Ok, MD     Vital Signs: BP 107/81 (BP Location: Right Arm)   Pulse 96   Temp 98.2 F (36.8 C) (Oral)   Resp 18   Ht 6\' 4"  (1.93 m)   Wt 221 lb 12.8 oz (100.6 kg)   SpO2 100%   BMI 27.00 kg/m   Physical Exam Vitals and nursing note reviewed.  Constitutional:      General: He is not in acute distress.    Appearance: Normal appearance.  Pulmonary:     Effort: Pulmonary effort is normal. No respiratory distress.  Skin:    General: Skin is warm and dry.     Comments: Right groin incision soft without  active bleeding or hematoma.  Neurological:     Mental Status: He is alert and oriented to person, place, and time.     Comments: Distal pulses (DPs and PTs) 2+ bilaterally.     Imaging: CARDIAC CATHETERIZATION  Result Date: 11/12/2019  Mid LAD lesion is 100% stenosed. Chronically occluded with left to left collaterals.  LV end diastolic pressure is mildly elevated.  There is severe left ventricular systolic dysfunction.  The left ventricular ejection fraction is less than 25% by visual estimate.  There is no aortic valve stenosis.  Hemodynamic findings consistent with mild pulmonary hypertension.  Consider viability study of anterior wall.  If viable, would plan for CTO PCI of LAD.  If no chance of recovery, then medical therapy without revascularization. Explained to wife, Beth [place]:  CBC: Recent Labs    11/11/19 0742 11/11/19 0742 11/12/19 0514 11/12/19 0514 11/12/19 1410 11/12/19 1413 11/13/19 0453 11/14/19 0615  WBC 6.6  --  5.9  --   --   --  7.9 8.8  HGB 13.7   < > 14.6   < > 16.3 16.3 15.3 16.2  HCT 43.7   < > 45.8   < > 48.0 48.0 48.0 51.1  PLT 131*  --  145*  --   --   --  177 183   < > = values in  this interval not displayed.    COAGS: Recent Labs    11/13/19 1012  INR 1.1    BMP: Recent Labs    11/10/19 0505 11/10/19 0505 11/11/19 0742 11/11/19 0742 11/12/19 1410 11/12/19 1413 11/13/19 0453 11/14/19 0615  NA 133*   < > 132*   < > 132* 133* 135 136  K 4.8   < > 4.2   < > 4.4 4.3 4.4 3.8  CL 85*  --  88*  --   --   --  90* 90*  CO2 34*  --  31  --   --   --  34* 32  GLUCOSE 117*  --  100*  --   --   --  107* 119*  BUN 13  --  14  --   --   --  28* 26*  CALCIUM 9.1  --  9.1  --   --   --  9.8 9.7  CREATININE 0.94  --  0.91  --   --   --  1.02 1.12  GFRNONAA >60  --  >60  --   --   --  >60 >60  GFRAA >60  --  >60  --   --   --  >60 >60   < > = values in this interval not displayed.    LIVER FUNCTION TESTS: Recent Labs     11/05/19 1721 11/05/19 1721 11/05/19 2053 11/06/19 0312 11/08/19 2306 11/10/19 0505  BILITOT 1.5*  --  1.6* 1.3*  --   --   AST 30  --  34 21  --   --   ALT 23  --  19 21  --   --   ALKPHOS 63  --  58 60  --   --   PROT 6.0*  --  5.6* 5.6*  --   --   ALBUMIN 3.8   < > 3.5 3.5 3.8 3.6   < > = values in this interval not displayed.    Assessment and Plan:  Right MCA bifurcation aneurysm, confirmed on diagnostic cerebral arteriogram today with Dr. Corliss Skains. Discussed treatment options with patient (stent and coil placement vs web device placement). Risks and benefits of each discussed. At this time, given his clinical picture and aneurysm, recommend web device (easier to deploy, therefore less anesthesia/contrast time)- however this device will have be ordered. Dr. Corliss Skains to discuss with colleges and will plan treatment accordingly based on device availability- procedure is not scheduled at this time, the earliest procedure will be scheduled would be this Friday 11/16/2019. Dr. Corliss Skains discussed above with Dr. Tenny Craw who is in agreement with plan. Right groin incision soft, distal pulses 2+ bilaterally. Continue taking Brilinta 45 mg twice daily and Aspirin 81 mg once daily- will recheck P2Y12 tomorrow AM. Patient will be consented for procedure, along with placement of appropriate pre-procedure orders once procedure is scheduled. Further plans per TRH/cardiology- appreciate and agree with plan. NIR to follow.   Electronically Signed: Elwin Mocha, PA-C 11/14/2019, 3:10 PM   I spent a total of 25 Minutes at the the patient's bedside AND on the patient's hospital floor or unit, greater than 50% of which was counseling/coordinating care for right MCA bifurcation aneurysm.

## 2019-11-14 NOTE — Progress Notes (Signed)
Bipap is PRN order.  Patient on 2L [place] with sats in upper 90s.  No distress noted.

## 2019-11-14 NOTE — Progress Notes (Signed)
  ReDS Clip Diuretic Study Pt study # [id number]  Your patient has been enrolled in the ReDS Clip Diuretic Study   Changes to prescribed diuretics recommended:  Continue lasix 80 mg IV BID.  Weight down 7 lb, uop 3.3 L/24 hr, SCr up slightly to 1.12.  Provider contacted: Dr. Tenny Craw  REDS Clip  READING= 44%  CHEST RULER = 38 Clip Station = D   Orthodema score = 1 Signs/Symptoms Score   Mild edema, no orthopnea 0 No congestion  Moderate edema, no orthopnea 1 Low-grade orthodema/congestion  Severe edema OR orthopnea 2   Moderate edema and orthopnea 3 High-grade orthodema/congestion  Severe edema AND orthopnea 4    Sherron Monday, PharmD, BCCCP Clinical Pharmacist  Phone: [phone]  Please check AMION for all [facility] Pharmacy phone numbers After 10:00 PM, call Main Pharmacy [phone] 11/14/2019       7:31 AM

## 2019-11-14 NOTE — Progress Notes (Signed)
ANTICOAGULATION CONSULT NOTE - Follow Up Consult  Pharmacy Consult for Heparin Indication: atrial fibrillation  No Known Allergies  Patient Measurements: Height: 6\' 4"  (193 cm) Weight: 221 lb 12.8 oz (100.6 kg) IBW/kg (Calculated) : 86.8 Heparin Dosing Weight: 103.5 kg  Vital Signs: Temp: 98.2 F (36.8 C) (03/31 0920) Temp Source: Oral (03/31 0920) BP: 93/78 (03/31 1230) Pulse Rate: 92 (03/31 1230)  Labs: Recent Labs    11/12/19 0514 11/12/19 0514 11/12/19 1410 11/12/19 1413 11/13/19 0453 11/13/19 0453 11/13/19 1012 11/13/19 1548 11/13/19 2311 11/14/19 0615  HGB 14.6   < >   < > 16.3 15.3  --   --   --   --  16.2  HCT 45.8  --    < > 48.0 48.0  --   --   --   --  51.1  PLT 145*  --   --   --  177  --   --   --   --  183  LABPROT  --   --   --   --   --   --  14.0  --   --   --   INR  --   --   --   --   --   --  1.1  --   --   --   HEPARINUNFRC 0.46   < >  --   --  0.24*   < >  --  0.51 0.60 0.65  CREATININE  --   --   --   --  1.02  --   --   --   --  1.12   < > = values in this interval not displayed.    Estimated Creatinine Clearance: 85 mL/min (by C-G formula based on SCr of 1.12 mg/dL).   Medical History: Past Medical History:  Diagnosis Date  . CHF (congestive heart failure) (HCC)   . Gout   . New onset atrial fibrillation [facility])     Assessment: Richard Graham is a 64 yr old male presenting with new-onset atrial fibrillation with CHF. He is not on any anticoagulation PTA. His CHADS2VASC score is 1 (new CHF).  IV heparin was held overnight 3/23-3/24 with large R MCA bifurcation aneurysm (no evidence to suggest rupture). Per discussion with Dr. 11-19-1969 3/24 AM, he would like to resume heparin and defer further recommendations to Cardiology and Neuro IR. Will target lower heparin level goal with aneurysm.  Patient is now S/P cath on 3/29. Pharmacy was consulted to resume heparin 8 hours post-sheath removal. Sheath removed at 1426. Will resume heparin at  previously therapeutic rate. CBC stable. No active bleed issues reported.  Cardiology coordinating with IR regarding timing of cardioversion and diagnostic angiogram since patient will need uninterrupted anticoagulation post-cardioversion. Pt is currently in IR, heparin has been turned off.   Heparin level was elevated this am at 0.65, would restart at 1500/hr after cleared by IR.   Goal of Therapy:  Heparin level 0.3-0.5 units/ml Monitor platelets by anticoagulation protocol: Yes   Plan:  F/U after IR procedure   Thank you 4/29 PharmD., BCPS Clinical Pharmacist 11/14/2019 12:37 PM

## 2019-11-14 NOTE — Progress Notes (Signed)
PROGRESS NOTE    Richard Graham  KTG:[id number] DOB: [birth year] DOA: 11/05/2019 PCP: No primary care provider on file.   Brief Narrative:  64 year old with history of prediabetes, obesity, gout admitted for worsening shortness of breath and lower extremity swelling. Found to be in atrial fibrillation, new diagnosis. Chest x-ray showed pulmonary edema. Cardiology team was consulted, started on diuretics, heparin drip and Cardizem drip. Suspected his hyponatremia was secondary to fluid overload.  3/31: Angiogram diagnostic done today, to have intervention 11/15/19.   Assessment & Plan:   Principal Problem:   Acute CHF (congestive heart failure) (HCC) Active Problems:   Gout   Thrombocytopenia (HCC)   New onset a-fib (HCC)   Morbid obesity (HCC)   Hypervolemia   SOB (shortness of breath)  Acute congestive heart failure, reduced ejection fraction 20-25%. Ischemic cardiomyopathy - Echocardiogram showing EF 20-25%, akinetic apex.  - 2 g salt diet low fluid restriction - Continue lasix dose at 80 mg BID - Continue potassium 40 mg daily -Cardiology has done cardiac cath and no emergent issues  New onset atrial fibrillation - continue heparindrip/amiodaroneoral - defer long-term plan to cards, they are considering TEE/DCCV depending on intervention to MCA aneurysm likely that will be deferred for now  Acute metabolic encephalopathy,resolved CO2 narcosis; improved.  Right MCA bifurcation aneurysm, 10 mm - CT of the head is negative for any acute bleed but coincidental finding of large right MCA bifurcation aneurysm, 10 mm. - Dicussed with Dr [name].Angiogram done 11/14/19 plan to intervene 11/15/19 - MRI brain and MRA headcomplete; see below  Thrombocytopenia - trend, no evidence of bleed - Stable  Hyponatremia - stable, monitor  Hypokalemia - Will need continued repletion given high dose lasix  -  Continue 40 meq daily  DVT prophylaxis: heparin drip Code Status: Full Family Communication: Wife and patient at bedside Disposition Plan:  . Patient came from:home            . Anticipated d/c place: home . Barriers to d/c OR conditions which need to be met to effect a safe d/c: needs intervention for R MCA aneurysm 11/15/19 and then recovery with outpatient cardiology follow up of A fib  Consultants:   IR  Cardiology  Procedures:   Angiogram 11/14/19  Cardiac cath 3/29/21mid LAD 100% stenosed, EEF 25%, mild pulm htn  Antimicrobials:   none  Subjective: Sleeping initially, denies problems overnight. Sleeping medicine helped. Awaiting procedure today for diagnosis and then tomorrow for treatment of aneurysm. Denies chest pains or SOB. Denies abdominal pain or N/V/D/constipation.  Objective: Vitals:   11/14/19 1230 11/14/19 1235 11/14/19 1241 11/14/19 1246  BP: 93/78 110/86 100/73 107/81  Pulse: 92 (!) 107 (!) 105 96  Resp: '12 20 16 18  ' Temp:      TempSrc:      SpO2: 96% 100% 100% 100%  Weight:      Height:        Intake/Output Summary (Last 24 hours) at 11/14/2019 1330 Last data filed at 11/14/2019 0400 Gross per 24 hour  Intake 942.43 ml  Output 1850 ml  Net -907.57 ml   Filed Weights   11/12/19 0556 11/13/19 0319 11/14/19 0635  Weight: 105 kg 103.6 kg 100.6 kg    Examination:  General exam: Appears calm and comfortable, sleeping when I enter Respiratory system: Clear to auscultation. Respiratory effort normal. Cardiovascular system: S1 & S2 heard. No JVD, murmurs, rubs, gallops or clicks. No pedal edema. Gastrointestinal system: Abdomen is nondistended, soft and nontender. No organomegaly  or masses felt. Normal bowel sounds heard. Central nervous system: Alert and oriented. No focal neurological deficits. Extremities: Symmetric 5 x 5 power. Skin: No rashes, lesions or ulcers Psychiatry: Judgement and insight appear normal. Mood & affect appropriate.    Data Reviewed: I have personally reviewed following labs and imaging studies  CBC: Recent Labs  Lab 11/08/19 2306 11/08/19 2306 11/09/19 0939 11/09/19 0939 11/11/19 0742 11/11/19 0742 11/12/19 0514 11/12/19 1410 11/12/19 1413 11/13/19 0453 11/14/19 0615  WBC 8.2   < > 7.9  --  6.6  --  5.9  --   --  7.9 8.8  NEUTROABS 5.3  --   --   --   --   --   --   --   --   --   --   HGB 15.0   < > 14.3   < > 13.7   < > 14.6 16.3 16.3 15.3 16.2  HCT 48.3   < > 45.9   < > 43.7   < > 45.8 48.0 48.0 48.0 51.1  MCV 96.2   < > 96.0  --  95.4  --  93.9  --   --  93.2 94.8  PLT 161   < > 140*  --  131*  --  145*  --   --  177 183   < > = values in this interval not displayed.   Basic Metabolic Panel: Recent Labs  Lab 11/08/19 2306 11/08/19 2306 11/10/19 0505 11/10/19 0505 11/11/19 1610 11/12/19 0514 11/12/19 1410 11/12/19 1413 11/13/19 0453 11/14/19 0615  NA 132*   < > 133*   < > 132*  --  132* 133* 135 136  K 3.8   < > 4.8   < > 4.2  --  4.4 4.3 4.4 3.8  CL 80*  --  85*  --  88*  --   --   --  90* 90*  CO2 41*  --  34*  --  31  --   --   --  34* 32  GLUCOSE 168*  --  117*  --  100*  --   --   --  107* 119*  BUN 11  --  13  --  14  --   --   --  28* 26*  CREATININE 0.95  --  0.94  --  0.91  --   --   --  1.02 1.12  CALCIUM 8.6*  --  9.1  --  9.1  --   --   --  9.8 9.7  MG 2.0  --  2.1  --  2.0 2.2  --   --   --  2.3  PHOS 3.4  --  3.8  --   --   --   --   --   --   --    < > = values in this interval not displayed.   GFR: Estimated Creatinine Clearance: 85 mL/min (by C-G formula based on SCr of 1.12 mg/dL). Liver Function Tests: Recent Labs  Lab 11/08/19 2306 11/10/19 0505  ALBUMIN 3.8 3.6   No results for input(s): LIPASE, AMYLASE in the last 168 hours. No results for input(s): AMMONIA in the last 168 hours. Coagulation Profile: Recent Labs  Lab 11/13/19 1012  INR 1.1   Cardiac Enzymes: No results for input(s): CKTOTAL, CKMB, CKMBINDEX, TROPONINI in the last 168  hours. BNP (last 3 results) No results for input(s): PROBNP in the last 8760  hours. HbA1C: No results for input(s): HGBA1C in the last 72 hours. CBG: Recent Labs  Lab 11/11/19 1212 11/11/19 1621 11/12/19 1630  GLUCAP 155* 123* 152*   Lipid Profile: No results for input(s): CHOL, HDL, LDLCALC, TRIG, CHOLHDL, LDLDIRECT in the last 72 hours. Thyroid Function Tests: No results for input(s): TSH, T4TOTAL, FREET4, T3FREE, THYROIDAB in the last 72 hours. Anemia Panel: No results for input(s): VITAMINB12, FOLATE, FERRITIN, TIBC, IRON, RETICCTPCT in the last 72 hours. Sepsis Labs: No results for input(s): PROCALCITON, LATICACIDVEN in the last 168 hours.  Recent Results (from the past 240 hour(s))  SARS CORONAVIRUS 2 (TAT 6-24 HRS) Nasopharyngeal Nasopharyngeal Swab     Status: None   Collection Time: 11/05/19  6:56 PM   Specimen: Nasopharyngeal Swab  Result Value Ref Range Status   SARS Coronavirus 2 NEGATIVE NEGATIVE Final    Comment: (NOTE) SARS-CoV-2 target nucleic acids are NOT DETECTED. The SARS-CoV-2 RNA is generally detectable in upper and lower respiratory specimens during the acute phase of infection. Negative results do not preclude SARS-CoV-2 infection, do not rule out co-infections with other pathogens, and should not be used as the sole basis for treatment or other patient management decisions. Negative results must be combined with clinical observations, patient history, and epidemiological information. The expected result is Negative. Fact Sheet for Patients: SugarRoll.be Fact Sheet for Healthcare Providers: https://www.woods-mathews.com/ This test is not yet approved or cleared by the [place] FDA and  has been authorized for detection and/or diagnosis of SARS-CoV-2 by FDA under an Emergency Use Authorization (EUA). This EUA will remain  in effect (meaning this test can be used) for the duration of the COVID-19  declaration under Section 56 4(b)(1) of the Act, 21 U.S.C. section 360bbb-3(b)(1), unless the authorization is terminated or revoked sooner. Performed at [facility], [place] [address] [place], [place] [address]     Radiology Studies: CARDIAC CATHETERIZATION  Result Date: 11/12/2019  Mid LAD lesion is 100% stenosed. Chronically occluded with left to left collaterals.  LV end diastolic pressure is mildly elevated.  There is severe left ventricular systolic dysfunction.  The left ventricular ejection fraction is less than 25% by visual estimate.  There is no aortic valve stenosis.  Hemodynamic findings consistent with mild pulmonary hypertension.  Consider viability study of anterior wall.  If viable, would plan for CTO PCI of LAD.  If no chance of recovery, then medical therapy without revascularization. Explained to wife, Beth [phone]   Scheduled Meds: . amiodarone  200 mg Oral BID  . aspirin EC  81 mg Oral Daily  . furosemide  80 mg Intravenous BID  . lidocaine      . metoprolol tartrate  50 mg Oral BID  . potassium chloride  40 mEq Oral Daily  . [START ON 11/15/2019] rosuvastatin  10 mg Oral q1800  . sodium chloride flush  3 mL Intravenous Q12H  . sodium chloride flush  3 mL Intravenous Q12H  . ticagrelor  45 mg Oral BID   Continuous Infusions: . sodium chloride    . sodium chloride    . heparin 1,700 Units/hr (11/14/19 1325)    LOS: 9 days   Time spent: [place], MD [facility]  To contact the attending provider between 7A-7P or the covering provider during after hours 7P-7A, please log into the web site [email or web address] and access using universal [place] password for that web site. If you do not have the password, please call  the hospital operator.  11/14/2019, 1:30 PM

## 2019-11-15 DIAGNOSIS — R0602 Shortness of breath: Secondary | ICD-10-CM

## 2019-11-15 DIAGNOSIS — J9601 Acute respiratory failure with hypoxia: Secondary | ICD-10-CM

## 2019-11-15 LAB — CBC
HCT: 52.9 % — ABNORMAL HIGH (ref 39.0–52.0)
Hemoglobin: 16.4 g/dL (ref 13.0–17.0)
MCH: 29.8 pg (ref 26.0–34.0)
MCHC: 31 g/dL (ref 30.0–36.0)
MCV: 96 fL (ref 80.0–100.0)
Platelets: 203 10*3/uL (ref 150–400)
RBC: 5.51 MIL/uL (ref 4.22–5.81)
RDW: 13.9 % (ref 11.5–15.5)
WBC: 9.2 10*3/uL (ref 4.0–10.5)
nRBC: 0 % (ref 0.0–0.2)

## 2019-11-15 LAB — PLATELET INHIBITION P2Y12: Platelet Function  P2Y12: 9 [PRU] — ABNORMAL LOW (ref 182–335)

## 2019-11-15 LAB — HEPARIN LEVEL (UNFRACTIONATED): Heparin Unfractionated: 0.4 IU/mL (ref 0.30–0.70)

## 2019-11-15 MED ORDER — TICAGRELOR 60 MG PO TABS
30.0000 mg | ORAL_TABLET | Freq: Two times a day (BID) | ORAL | Status: DC
  Administered 2019-11-15 – 2019-11-16 (×2): 30 mg via ORAL
  Filled 2019-11-15 (×3): qty 1

## 2019-11-15 MED ORDER — ROSUVASTATIN CALCIUM 5 MG PO TABS
5.0000 mg | ORAL_TABLET | Freq: Every day | ORAL | Status: DC
  Administered 2019-11-15 – 2019-11-16 (×2): 5 mg via ORAL
  Filled 2019-11-15 (×2): qty 1

## 2019-11-15 NOTE — Plan of Care (Signed)
  Problem: Education: Goal: Knowledge of General Education information will improve Description: Including pain rating scale, medication(s)/side effects and non-pharmacologic comfort measures Outcome: Progressing   Problem: Health Behavior/Discharge Planning: Goal: Ability to manage health-related needs will improve Outcome: Progressing   Problem: Clinical Measurements: Goal: Respiratory complications will improve Outcome: Progressing   

## 2019-11-15 NOTE — Progress Notes (Signed)
PROGRESS NOTE    [name]  URK:[id number] DOB: [birth year] DOA: 11/05/2019 PCP: No primary care provider on file.      Brief Narrative:  [name] is a 64 y.o. M with no significant PMHx who presented with 3 weeks exertional SOB, swelling, and orthopnea.  In the ER, noted to be in Afib with RVR.  CXR with edema.  Given IV diltiazem, IV Lasix and admitted to the hospitalist Service.         Assessment & Plan:  Acute systolic CHF Ischemic cardiomyopathy Admitted on IV Lasix.  Echo showed EF 25-30%, new onset.  Normal valves.  Ischemic work up showed chronic total LAD lesion.  Started on OMT.  -Continue Lasix IV BID -Continue Potassium supplement -Strict I/Os, daily weights, daily BMP -Consult Cardiology, appreciate cares  -Continue aspirin, Crestor, metoprolol, Brilinta -BP does not support other GDMT for CHF    New paroxysmal atrial fibrillation CHA2DS2-Vasc 2 given CHF, CVD.   HRs <110 at rest. -Continue heparin for now -Plan to stop heparin at discharge without anticoagulant until after IR procedure next week  -Continue amiodarone, metoprolol  Hyponatremia Resolved  Thrombocytopenia Resolved  Cerebral aneurysm This is a incidental finding while evaluating the patient for focal neurological deficits.  Stroke was ruled out but he was found to have an 11 mm saccular aneurysm of the right MCA bifurcation         Disposition: The patient was admitted with new onset CHF as well as A. fib with RVR. He was found to have an 11 mm saccular aneurysm of the right MCA.  He continues on IV heparin and IV Lasix.  We may be able to transition to oral Lasix and discharged over the weekend with follow-up next week for his aneurysm endovascular procedure.  I will discharge when his fluid status is euvolemic, his heart rate has improved, and his symptoms are resolved.        MDM: The below labs and imaging reports were reviewed and summarized above.   Medication management as above.  This is a severe exacerbation of heart failure.   DVT prophylaxis: Not applicable, on heparin drip Code Status: Full code Family Communication: Wife at the bedside    Consultants:   Interventional radiology  Cardiology  Procedures:   3/23 CT head  3/24 MR angiogram and MRI brain  3/31 vrtebral angiography  Antimicrobials:      Culture data:              Subjective: Patient is a little bit weak, but feeling good walking to the bathroom, no palpitations, orthopnea, chest pain, leg swelling.  No confusion, fever.  No headache, focal weakness, numbness, speech change.  Objective: Vitals:   11/15/19 0327 11/15/19 0800 11/15/19 0933 11/15/19 1216  BP: 102/74 94/68 102/80 108/87  Pulse: 81 97 82 (!) 101  Resp: 17 18  15   Temp: 98.4 F (36.9 C) 97.9 F (36.6 C)  97.6 F (36.4 C)  TempSrc: Oral Oral  Oral  SpO2: 95% 96%  100%  Weight: 101.3 kg     Height:        Intake/Output Summary (Last 24 hours) at 11/15/2019 1517 Last data filed at 11/15/2019 1211 Gross per 24 hour  Intake 2066.15 ml  Output 4476 ml  Net -2409.85 ml   Filed Weights   11/13/19 0319 11/14/19 0635 11/15/19 0327  Weight: 103.6 kg 100.6 kg 101.3 kg    Examination: General appearance: Well nourished adult male, alert and  in no obvious distress.  Lying in bed. HEENT: Anicteric, conjunctiva pink, lids and lashes normal. No nasal deformity, discharge, epistaxis.  Lips moist, dentition good repair, oropharynx moist, no oral lesions, hearing normal.   Skin: Warm and dry.  No jaundice.  No suspicious rashes or lesions. Cardiac: Tachycardic, regular, nl S1-S2, no murmurs appreciated.  Capillary refill is brisk.  JVP normal.  Trace LE edema.  Radial  pulses 2+ and symmetric. Respiratory: Normal respiratory rate and rhythm.  CTAB without rales or wheezes. Abdomen: Abdomen soft.  No TTP or guarding. No ascites, distension, hepatosplenomegaly.   MSK: No deformities  or effusions. Neuro: Awake and alert.  EOMI, moves all extremities. Speech fluent.    Psych: Sensorium intact and responding to questions, attention normal. Affect normal.  Judgment and insight appear normal.    Data Reviewed: I have personally reviewed following labs and imaging studies:  CBC: Recent Labs  Lab 11/08/19 2306 11/09/19 0939 11/11/19 0742 11/11/19 0742 11/12/19 0514 11/12/19 0514 11/12/19 1410 11/12/19 1413 11/13/19 0453 11/14/19 0615 11/15/19 0513  WBC 8.2   < > 6.6  --  5.9  --   --   --  7.9 8.8 9.2  NEUTROABS 5.3  --   --   --   --   --   --   --   --   --   --   HGB 15.0   < > 13.7   < > 14.6   < > 16.3 16.3 15.3 16.2 16.4  HCT 48.3   < > 43.7   < > 45.8   < > 48.0 48.0 48.0 51.1 52.9*  MCV 96.2   < > 95.4  --  93.9  --   --   --  93.2 94.8 96.0  PLT 161   < > 131*  --  145*  --   --   --  177 183 203   < > = values in this interval not displayed.   Basic Metabolic Panel: Recent Labs  Lab 11/08/19 2306 11/08/19 2306 11/10/19 0505 11/10/19 0505 11/11/19 0277 11/12/19 0514 11/12/19 1410 11/12/19 1413 11/13/19 0453 11/14/19 0615  NA 132*   < > 133*   < > 132*  --  132* 133* 135 136  K 3.8   < > 4.8   < > 4.2  --  4.4 4.3 4.4 3.8  CL 80*  --  85*  --  88*  --   --   --  90* 90*  CO2 41*  --  34*  --  31  --   --   --  34* 32  GLUCOSE 168*  --  117*  --  100*  --   --   --  107* 119*  BUN 11  --  13  --  14  --   --   --  28* 26*  CREATININE 0.95  --  0.94  --  0.91  --   --   --  1.02 1.12  CALCIUM 8.6*  --  9.1  --  9.1  --   --   --  9.8 9.7  MG 2.0  --  2.1  --  2.0 2.2  --   --   --  2.3  PHOS 3.4  --  3.8  --   --   --   --   --   --   --    < > = values in this  interval not displayed.   GFR: Estimated Creatinine Clearance: 85 mL/min (by C-G formula based on SCr of 1.12 mg/dL). Liver Function Tests: Recent Labs  Lab 11/08/19 2306 11/10/19 0505  ALBUMIN 3.8 3.6   No results for input(s): LIPASE, AMYLASE in the last 168 hours. No  results for input(s): AMMONIA in the last 168 hours. Coagulation Profile: Recent Labs  Lab 11/13/19 1012  INR 1.1   Cardiac Enzymes: No results for input(s): CKTOTAL, CKMB, CKMBINDEX, TROPONINI in the last 168 hours. BNP (last 3 results) No results for input(s): PROBNP in the last 8760 hours. HbA1C: No results for input(s): HGBA1C in the last 72 hours. CBG: Recent Labs  Lab 11/11/19 1212 11/11/19 1621 11/12/19 1630  GLUCAP 155* 123* 152*   Lipid Profile: No results for input(s): CHOL, HDL, LDLCALC, TRIG, CHOLHDL, LDLDIRECT in the last 72 hours. Thyroid Function Tests: No results for input(s): TSH, T4TOTAL, FREET4, T3FREE, THYROIDAB in the last 72 hours. Anemia Panel: No results for input(s): VITAMINB12, FOLATE, FERRITIN, TIBC, IRON, RETICCTPCT in the last 72 hours. Urine analysis:    Component Value Date/Time   COLORURINE STRAW (A) 11/05/2019 2134   APPEARANCEUR CLEAR 11/05/2019 2134   LABSPEC 1.004 (L) 11/05/2019 2134   PHURINE 6.0 11/05/2019 2134   GLUCOSEU NEGATIVE 11/05/2019 2134   HGBUR NEGATIVE 11/05/2019 2134   BILIRUBINUR NEGATIVE 11/05/2019 2134   KETONESUR NEGATIVE 11/05/2019 2134   PROTEINUR NEGATIVE 11/05/2019 2134   NITRITE NEGATIVE 11/05/2019 2134   LEUKOCYTESUR NEGATIVE 11/05/2019 2134   Sepsis Labs: @LABRCNTIP (procalcitonin:4,lacticacidven:4)  ) Recent Results (from the past 240 hour(s))  SARS CORONAVIRUS 2 (TAT 6-24 HRS) Nasopharyngeal Nasopharyngeal Swab     Status: None   Collection Time: 11/05/19  6:56 PM   Specimen: Nasopharyngeal Swab  Result Value Ref Range Status   SARS Coronavirus 2 NEGATIVE NEGATIVE Final    Comment: (NOTE) SARS-CoV-2 target nucleic acids are NOT DETECTED. The SARS-CoV-2 RNA is generally detectable in upper and lower respiratory specimens during the acute phase of infection. Negative results do not preclude SARS-CoV-2 infection, do not rule out co-infections with other pathogens, and should not be used as the sole  basis for treatment or other patient management decisions. Negative results must be combined with clinical observations, patient history, and epidemiological information. The expected result is Negative. Fact Sheet for Patients: 11/07/19 Fact Sheet for Healthcare Providers: HairSlick.no This test is not yet approved or cleared by the [place]irigir.com FDA and  has been authorized for detection and/or diagnosis of SARS-CoV-2 by FDA under an Emergency Use Authorization (EUA). This EUA will remain  in effect (meaning this test can be used) for the duration of the COVID-19 declaration under Section 56 4(b)(1) of the Act, 21 U.S.C. section 360bbb-3(b)(1), unless the authorization is terminated or revoked sooner. Performed at [facility], [address] [place], [place]          Radiology Studies: No results found.      Scheduled Meds: . amiodarone  200 mg Oral BID  . aspirin EC  81 mg Oral Daily  . furosemide  80 mg Intravenous BID  . metoprolol tartrate  50 mg Oral BID  . potassium chloride  40 mEq Oral Daily  . rosuvastatin  5 mg Oral Daily  . sodium chloride flush  3 mL Intravenous Q12H  . sodium chloride flush  3 mL Intravenous Q12H  . ticagrelor  30 mg Oral BID   Continuous Infusions: . sodium chloride    . heparin  1,500 Units/hr (11/14/19 2229)     LOS: 10 days    Time spent: 35 minutes    Alberteen Sam, MD [facility] 11/15/2019, 3:17 PM     Please page though AMION or Epic secure chat:  For [employer], Higher education careers adviser

## 2019-11-15 NOTE — Progress Notes (Addendum)
Progress Note  Patient Name: Richard Graham Date of Encounter: 11/15/2019  Primary Cardiologist: Richard Him, MD   Subjective   Breathing is OK at rest   No CP  Inpatient Medications    Scheduled Meds: . amiodarone  200 mg Oral BID  . aspirin EC  81 mg Oral Daily  . furosemide  80 mg Intravenous BID  . metoprolol tartrate  50 mg Oral BID  . potassium chloride  40 mEq Oral Daily  . rosuvastatin  10 mg Oral q1800  . sodium chloride flush  3 mL Intravenous Q12H  . sodium chloride flush  3 mL Intravenous Q12H  . ticagrelor  45 mg Oral BID   Continuous Infusions: . sodium chloride    . heparin 1,500 Units/hr (11/14/19 2229)   PRN Meds: sodium chloride, acetaminophen **OR** acetaminophen, acetaminophen, metoprolol tartrate, ondansetron **OR** ondansetron (ZOFRAN) IV, ondansetron (ZOFRAN) IV, oxyCODONE-acetaminophen **AND** oxyCODONE, sodium chloride flush, temazepam   Vital Signs    Vitals:   11/14/19 1942 11/14/19 1942 11/14/19 2246 11/15/19 0327  BP: 102/89   102/74  Pulse: 62  97 81  Resp: 19   17  Temp:  98.5 F (36.9 C)  98.4 F (36.9 C)  TempSrc:  Oral  Oral  SpO2: 95%   95%  Weight:    101.3 kg  Height:        Intake/Output Summary (Last 24 hours) at 11/15/2019 0751 Last data filed at 11/15/2019 0457 Gross per 24 hour  Intake 1266.15 ml  Output 3826 ml  Net -2559.85 ml   Net negative  35  L  Last 3 Weights 11/15/2019 11/14/2019 11/13/2019  Weight (lbs) 223 lb 4.8 oz 221 lb 12.8 oz 228 lb 6.3 oz  Weight (kg) 101.288 kg 100.608 kg 103.6 kg      Telemetry     Afib  90s to 100s   - Personally Reviewed  ECG    No new ECG tracing today. - Personally Reviewed  Physical Exam   GEN: No acute distress.   Neck: Supple. Difficult to assess JVP is not elevated      Cardiac: Irregularly irregular rhythm, Respiratory:  CTA   GI: Soft,.  MS:  No signif LE edema   Labs    High Sensitivity Troponin:   Recent Labs  Lab 11/05/19 1731 11/05/19 1822  11/05/19 2053  TROPONINIHS 11 14 12       Chemistry Recent Labs  Lab 11/08/19 2306 11/08/19 2306 11/10/19 0505 11/10/19 0505 11/11/19 0742 11/12/19 1410 11/12/19 1413 11/13/19 0453 11/14/19 0615  NA 132*   < > 133*   < > 132*   < > 133* 135 136  K 3.8   < > 4.8   < > 4.2   < > 4.3 4.4 3.8  CL 80*   < > 85*   < > 88*  --   --  90* 90*  CO2 41*   < > 34*   < > 31  --   --  34* 32  GLUCOSE 168*   < > 117*   < > 100*  --   --  107* 119*  BUN 11   < > 13   < > 14  --   --  28* 26*  CREATININE 0.95   < > 0.94   < > 0.91  --   --  1.02 1.12  CALCIUM 8.6*   < > 9.1   < > 9.1  --   --  9.8 9.7  ALBUMIN 3.8  --  3.6  --   --   --   --   --   --   GFRNONAA >60   < > >60   < > >60  --   --  >60 >60  GFRAA >60   < > >60   < > >60  --   --  >60 >60  ANIONGAP 11   < > 14   < > 13  --   --  11 14   < > = values in this interval not displayed.     Hematology Recent Labs  Lab 11/13/19 0453 11/14/19 0615 11/15/19 0513  WBC 7.9 8.8 9.2  RBC 5.15 5.39 5.51  HGB 15.3 16.2 16.4  HCT 48.0 51.1 52.9*  MCV 93.2 94.8 96.0  MCH 29.7 30.1 29.8  MCHC 31.9 31.7 31.0  RDW 14.0 14.1 13.9  PLT 177 183 203    BNP No results for input(s): BNP, PROBNP in the last 168 hours.   DDimer No results for input(s): DDIMER in the last 168 hours.   Radiology    No results found.  Cardiac Studies   L heart cath  11/12/19   Mid LAD lesion is 100% stenosed. Chronically occluded with left to left collaterals.  LV end diastolic pressure is mildly elevated.  There is severe left ventricular systolic dysfunction.  The left ventricular ejection fraction is less than 25% by visual estimate.  There is no aortic valve stenosis.  Hemodynamic findings consistent with mild pulmonary hypertension.   Consider viability study of anterior wall.  If viable, would plan for CTO PCI of LAD.  If no chance of recovery, then medical therapy without revascularization.    Echocardiogram 11/06/2019: Impressions:  1. LV function is severely reduced, 20-25%. The mid anteroseptum into  apex is akinetic, almost aneurysmal, consistent with prior LAD infarction  (Q waves on EKG). The myocardium is thinned, futher supporting prior  infarction. On contrast imaging, there  is no LV thrombus seen.  2. Left ventricular ejection fraction, by estimation, is 20 to 25%. The  left ventricle has severely decreased function. The left ventricle  demonstrates regional wall motion abnormalities (see scoring  diagram/findings for description). There is mild  concentric left ventricular hypertrophy. Left ventricular diastolic  function could not be evaluated.  3. Right ventricular systolic function is mildly reduced. The right  ventricular size is normal. There is mildly elevated pulmonary artery  systolic pressure. The estimated right ventricular systolic pressure is  42.2 mmHg.  4. Left atrial size was mildly dilated.  5. The mitral valve is grossly normal. Trivial mitral valve  regurgitation. No evidence of mitral stenosis.  6. The aortic valve is tricuspid. Aortic valve regurgitation is not  visualized. No aortic stenosis is present.   Conclusion(s)/Recommendation(s): Findings consistent with ischemic  cardiomyopathy.  Patient Profile   Richard Graham is a 64 y.o. male with a history of pre-diabetes and obesity who is being seen for evaluation of new onset atrial fibrillation and CHF at the request of Dr. Jacqulyn Graham.   Assessment & Plan    New Onset Acute Systolic CHF  Left heart cath results above with LAD 100% occluded   Pt had event of pain  Remote  The LAD territory fills via left to left collaterals  May have viability in region but no CP        Plan for medical Rx for now  At some pt consider viability eval  He is having no CP      Atrial fibrillation   CHADSVASc score is 2      Rates are fair   On metoprolol and amiodarone   Ambulate and follow    On heparin for now.  Now he is on ASA and  Brilinta as well When goes home for short term probably keep on this and not add Xarelto given this would be triple Rx and he would have to come off on Sunday or Montday  Plan for cardioversoin after Rx of aneurysm.  Proba as outpt   R MCA aneurysm: Plan for intervention by IR next week  Started on antiplt Rx   HL  Lipids from 3/25 LDL 56  HDL 40  Was not on statin   Will start Crestor for antiinflam Rx / plaque stabilizine  For questions or updates, please contact [facility] Please consult [email or web address] for contact info under      Signed, Dietrich Pates, MD  11/15/2019, 7:51 AM

## 2019-11-15 NOTE — Progress Notes (Signed)
  ReDS Clip Diuretic Study Pt study # [id number]  Your patient has been enrolled in the ReDS Clip Diuretic Study   Changes to prescribed diuretics recommended:  Continue lasix 80 mg IV BID - plan for possible PO tomorrow.  Weight up 1 lb, uop 3.8 L/24 hr.  Provider contacted: Dr. Tenny Craw  REDS Clip  READING= 34%  CHEST RULER = 38 Clip Station = D   Orthodema score = 1 Signs/Symptoms Score   Mild edema, no orthopnea 0 No congestion  Moderate edema, no orthopnea 1 Low-grade orthodema/congestion  Severe edema OR orthopnea 2   Moderate edema and orthopnea 3 High-grade orthodema/congestion  Severe edema AND orthopnea 4    Sherron Monday, PharmD, BCCCP Clinical Pharmacist  Phone: [phone]  Please check AMION for all [facility] Pharm[facility] phone numbers After 10:00 PM, call Main Pharmacy [phone] 11/15/2019       8:34 AM

## 2019-11-15 NOTE — Plan of Care (Signed)

## 2019-11-15 NOTE — Progress Notes (Signed)
   Vital Signs MEWS/VS Documentation       11/15/2019 1453 11/15/2019 1533 11/15/2019 1905 11/15/2019 2008   MEWS Score:  3  0  2  2   MEWS Score Color:  Yellow  Green  Yellow  Yellow   Resp:  --  14  --  19   Pulse:  --  89  --  (!) 109   BP:  --  113/90  --  96/73   Temp:  --  98.6 F (37 C)  --  98.2 F (36.8 C)   O2 Device:  --  Room Air  --  Room Air            Jeanella Flattery 11/15/2019,9:03 PM

## 2019-11-15 NOTE — Progress Notes (Signed)
ANTICOAGULATION CONSULT NOTE - Follow Up Consult  Pharmacy Consult for Heparin Indication: atrial fibrillation  No Known Allergies  Patient Measurements: Height: 6\' 4"  (193 cm) Weight: 101.3 kg (223 lb 4.8 oz) IBW/kg (Calculated) : 86.8 Heparin Dosing Weight: 103.5 kg  Vital Signs: Temp: 97.9 F (36.6 C) (04/01 0800) Temp Source: Oral (04/01 0800) BP: 102/80 (04/01 0933) Pulse Rate: 82 (04/01 0933)  Labs: Recent Labs     0000 11/13/19 0453 11/13/19 1012 11/13/19 1548 11/14/19 0615 11/14/19 2054 11/15/19 0513  HGB   < > 15.3  --   --  16.2  --  16.4  HCT  --  48.0  --   --  51.1  --  52.9*  PLT  --  177  --   --  183  --  203  LABPROT  --   --  14.0  --   --   --   --   INR  --   --  1.1  --   --   --   --   HEPARINUNFRC  --  0.24*  --    < > 0.65 0.32 0.40  CREATININE  --  1.02  --   --  1.12  --   --    < > = values in this interval not displayed.    Estimated Creatinine Clearance: 85 mL/min (by C-G formula based on SCr of 1.12 mg/dL).   Medical History: Past Medical History:  Diagnosis Date  . CHF (congestive heart failure) (HCC)   . Gout   . New onset atrial fibrillation [facility])     Assessment: Mr Richard Graham is a 64 yr old male presenting with new-onset atrial fibrillation with CHF. He is not on any anticoagulation PTA. His CHADS2VASC score is 1 (new CHF).  Pt is S/P 4-vessel cerebral arteriogram, with finding of R MCA bifurcation aneurysm. Heparin therapeutic this AM at 0.40 on 1500 units/hour. No issues with infusion or bleeding reported per RN.  Goal of Therapy:  Heparin level 0.3-0.5 units/ml Monitor platelets by anticoagulation protocol: Yes   Plan:  Continue heparin at 1500 units/hr Daily daily heparin level, CBC Monitor for signs/symptoms of bleeding F/U plans for procedures (IR, Cardiology)  Richard Czarnecki L. 79, PharmD [facility] PGY1 Pharmacy Resident [phone] 11/15/19      10:44 AM  Please check AMION for all [facility] Pharmacy phone numbers After  10:00 PM, call the Main Pharmacy [phone]

## 2019-11-15 NOTE — Progress Notes (Signed)
Referring Physician(s): Amoin, Ankit Chirag; Pricilla Riffle  Supervising Physician: Julieanne Cotton  Patient Status:  [facility] - In-pt  Chief Complaint:  Rt MCA aneurysm  Brief History:  Richard Graham is a 64 y.o. male who presented to ED on 11/06/19 due to worsening shortness of breath and leg swelling since 3 weeks.    He has had a 40-50 pound weight gain and has been evaluated by Dr. Tenny Craw for new diagnosis of CHF and Afib with RVR.    He was also having intermittent episodes of confusion.  MR Brain showed a   He underwent cardiac cath by Dr. Tenny Craw on 11/12/19 = LAD 100% occluded, medical therapy for now.  Currently on heparin drip, Brilinta 45 mg BID, and Aspirin 81 mg.  Subjective:  No complaints.   Allergies: Patient has no known allergies.  Medications: Prior to Admission medications   Medication Sig Start Date End Date Taking? Authorizing Provider  aspirin (ASPIRIN EC) 81 MG EC tablet Take 81 mg by mouth daily.   Yes [provider]  colchicine 0.6 MG tablet Take 0.6 mg by mouth 2 (two) times daily as needed (for gout flares).    Yes [provider]  diclofenac sodium (VOLTAREN) 1 % GEL Apply 2 g topically 2 (two) times daily as needed (to affected sites- for arthritis pain).    Yes [provider]  Omega-3 Fatty Acids (FISH OIL) 1200 MG CAPS Take 1,200 mg by mouth every other day.   Yes [provider]  oxyCODONE-acetaminophen (PERCOCET) 10-325 MG tablet Take 1 tablet by mouth every 4 (four) hours as needed for pain. 06/27/15  Yes Axel Filler, MD  oxyCODONE-acetaminophen (ROXICET) 5-325 MG tablet Take 1-2 tablets by mouth every 4 (four) hours as needed. Patient not taking: Reported on 11/05/2019 06/27/15   Axel Filler, MD     Vital Signs: BP 102/80 (BP Location: Left Arm)   Pulse 82   Temp 97.9 F (36.6 C) (Oral)   Resp 18   Ht 6\' 4"  (1.93 m)   Wt 101.3 kg   SpO2 96%   BMI 27.18 kg/m   Physical  Exam Constitutional:      Appearance: Normal appearance.  HENT:     Head: Normocephalic and atraumatic.  Cardiovascular:     Rate and Rhythm: Normal rate.  Pulmonary:     Effort: Pulmonary effort is normal. No respiratory distress.  Abdominal:     Palpations: Abdomen is soft.     Tenderness: There is no abdominal tenderness.  Skin:    General: Skin is warm and dry.  Neurological:     General: No focal deficit present.     Mental Status: He is alert and oriented to person, place, and time.  Psychiatric:        Mood and Affect: Mood normal.        Behavior: Behavior normal.        Thought Content: Thought content normal.        Judgment: Judgment normal.     Imaging: CARDIAC CATHETERIZATION  Result Date: 11/12/2019  Mid LAD lesion is 100% stenosed. Chronically occluded with left to left collaterals.  LV end diastolic pressure is mildly elevated.  There is severe left ventricular systolic dysfunction.  The left ventricular ejection fraction is less than 25% by visual estimate.  There is no aortic valve stenosis.  Hemodynamic findings consistent with mild pulmonary hypertension.  Consider viability study of anterior wall.  If viable, would plan  for CTO PCI of LAD.  If no chance of recovery, then medical therapy without revascularization. Explained to wife, Beth Hanna:  CBC: Recent Labs    11/12/19 0514 11/12/19 1410 11/12/19 1413 11/13/19 0453 11/14/19 0615 11/15/19 0513  WBC 5.9  --   --  7.9 8.8 9.2  HGB 14.6   < > 16.3 15.3 16.2 16.4  HCT 45.8   < > 48.0 48.0 51.1 52.9*  PLT 145*  --   --  177 183 203   < > = values in this interval not displayed.    COAGS: Recent Labs    11/13/19 1012  INR 1.1    BMP: Recent Labs    11/10/19 0505 11/10/19 0505 11/11/19 0742 11/11/19 0742 11/12/19 1410 11/12/19 1413 11/13/19 0453 11/14/19 0615  NA 133*   < > 132*   < > 132* 133* 135 136  K 4.8   < > 4.2   < > 4.4 4.3 4.4 3.8  CL 85*  --  88*  --    --   --  90* 90*  CO2 34*  --  31  --   --   --  34* 32  GLUCOSE 117*  --  100*  --   --   --  107* 119*  BUN 13  --  14  --   --   --  28* 26*  CALCIUM 9.1  --  9.1  --   --   --  9.8 9.7  CREATININE 0.94  --  0.91  --   --   --  1.02 1.12  GFRNONAA >60  --  >60  --   --   --  >60 >60  GFRAA >60  --  >60  --   --   --  >60 >60   < > = values in this interval not displayed.    LIVER FUNCTION TESTS: Recent Labs    11/05/19 1721 11/05/19 1721 11/05/19 2053 11/06/19 0312 11/08/19 2306 11/10/19 0505  BILITOT 1.5*  --  1.6* 1.3*  --   --   AST 30  --  34 21  --   --   ALT 23  --  19 21  --   --   ALKPHOS 63  --  58 60  --   --   PROT 6.0*  --  5.6* 5.6*  --   --   ALBUMIN 3.8   < > 3.5 3.5 3.8 3.6   < > = values in this interval not displayed.    Assessment and Plan:  Relatively large 42mm saccular aneurysm of the right MCA bifurcation.   Currently on Brilinta 45 mg BID and 81 mg Aspirin with P2y12 = 9.  Will change to Brilinta 30 mg BID, continue aspirin. Recheck P2Y12 Monday am.  Plan for Cerebral intervention with Web device on Wednesday, April 7. This device will significantly reduce the time the patient requires general anesthesia.  Risks and benefits of cerebral angiogram with intervention were discussed with the patient including, but not limited to bleeding, infection, vascular injury, contrast induced renal failure, stroke or even death.  This interventional procedure involves the use of X-rays and because of the nature of the planned procedure, it is possible that we will have prolonged use of X-ray fluoroscopy.  Potential radiation risks to you include (but are not limited to) the following: - A slightly elevated risk for cancer  several  years later in life. This risk is typically less than 0.5% percent. This risk is low in comparison to the normal incidence of human cancer, which is 33% for women and 50% for men according to the [employer] Cancer Society. -  Radiation induced injury can include skin redness, resembling a rash, tissue breakdown / ulcers and hair loss (which can be temporary or permanent).   The likelihood of either of these occurring depends on the difficulty of the procedure and whether you are sensitive to radiation due to previous procedures, disease, or genetic conditions.   IF your procedure requires a prolonged use of radiation, you will be notified and given written instructions for further action.  It is your responsibility to monitor the irradiated area for the 2 weeks following the procedure and to notify your physician if you are concerned that you have suffered a radiation induced injury.    All of the patient's questions were answered, patient is agreeable to proceed.  Electronically Signed: Gwynneth Macleod, PA-C 11/15/2019, 10:27 AM    I spent a total of 15 Minutes at the the patient's bedside AND on the patient's hospital floor or unit, greater than 50% of which was counseling/coordinating care for planning aneurysm repair.

## 2019-11-16 DIAGNOSIS — J9601 Acute respiratory failure with hypoxia: Secondary | ICD-10-CM

## 2019-11-16 LAB — CBC
HCT: 50 % (ref 39.0–52.0)
Hemoglobin: 15.7 g/dL (ref 13.0–17.0)
MCH: 29.8 pg (ref 26.0–34.0)
MCHC: 31.4 g/dL (ref 30.0–36.0)
MCV: 95.1 fL (ref 80.0–100.0)
Platelets: 214 10*3/uL (ref 150–400)
RBC: 5.26 MIL/uL (ref 4.22–5.81)
RDW: 14 % (ref 11.5–15.5)
WBC: 7.9 10*3/uL (ref 4.0–10.5)
nRBC: 0 % (ref 0.0–0.2)

## 2019-11-16 LAB — BASIC METABOLIC PANEL
Anion gap: 11 (ref 5–15)
BUN: 20 mg/dL (ref 8–23)
CO2: 31 mmol/L (ref 22–32)
Calcium: 9.3 mg/dL (ref 8.9–10.3)
Chloride: 95 mmol/L — ABNORMAL LOW (ref 98–111)
Creatinine, Ser: 0.97 mg/dL (ref 0.61–1.24)
GFR calc Af Amer: >60 mL/min (ref >60)
GFR calc non Af Amer: >60 mL/min (ref >60)
Glucose, Bld: 105 mg/dL — ABNORMAL HIGH (ref 70–99)
Potassium: 4.4 mmol/L (ref 3.5–5.1)
Sodium: 137 mmol/L (ref 135–145)

## 2019-11-16 LAB — HEPARIN LEVEL (UNFRACTIONATED): Heparin Unfractionated: 0.47 IU/mL (ref 0.30–0.70)

## 2019-11-16 LAB — PLATELET INHIBITION P2Y12: Platelet Function  P2Y12: 8 [PRU] — ABNORMAL LOW (ref 182–335)

## 2019-11-16 MED ORDER — TICAGRELOR 60 MG PO TABS: 30.0000 mg | ORAL_TABLET | Freq: Two times a day (BID) | ORAL | 3 refills | Status: DC

## 2019-11-16 MED ORDER — FUROSEMIDE 40 MG PO TABS: 80.0000 mg | ORAL_TABLET | Freq: Every day | ORAL | 3 refills | Status: DC

## 2019-11-16 MED ORDER — AMIODARONE HCL 200 MG PO TABS: 200.0000 mg | ORAL_TABLET | Freq: Every day | ORAL | 3 refills | Status: DC

## 2019-11-16 MED ORDER — POTASSIUM CHLORIDE CRYS ER 20 MEQ PO TBCR
20.0000 meq | EXTENDED_RELEASE_TABLET | Freq: Every day | ORAL | Status: DC
  Administered 2019-11-16: 20 meq via ORAL
  Filled 2019-11-16: qty 1

## 2019-11-16 MED ORDER — TEMAZEPAM 30 MG PO CAPS: 30.0000 mg | ORAL_CAPSULE | Freq: Every evening | ORAL | 0 refills | Status: DC | PRN

## 2019-11-16 MED ORDER — AMIODARONE HCL 200 MG PO TABS: 200.0000 mg | ORAL_TABLET | Freq: Two times a day (BID) | ORAL | 0 refills | Status: DC

## 2019-11-16 MED ORDER — ROSUVASTATIN CALCIUM 5 MG PO TABS: 5.0000 mg | ORAL_TABLET | Freq: Every day | ORAL | 3 refills | Status: DC

## 2019-11-16 MED ORDER — METOPROLOL TARTRATE 50 MG PO TABS: 50.0000 mg | ORAL_TABLET | Freq: Two times a day (BID) | ORAL | 3 refills | Status: DC

## 2019-11-16 MED ORDER — FUROSEMIDE 40 MG PO TABS
40.0000 mg | ORAL_TABLET | Freq: Two times a day (BID) | ORAL | Status: DC
  Administered 2019-11-16: 40 mg via ORAL
  Filled 2019-11-16: qty 1

## 2019-11-16 MED ORDER — POTASSIUM CHLORIDE CRYS ER 20 MEQ PO TBCR: 20.0000 meq | EXTENDED_RELEASE_TABLET | Freq: Every day | ORAL | 3 refills | Status: DC

## 2019-11-16 NOTE — Progress Notes (Signed)
  ReDS Clip Diuretic Study Pt study # [id number]  Your patient has been enrolled in the ReDS Clip Diuretic Study   Changes to prescribed diuretics recommended:  Consider switching to PO diuretics.  Weight remains the same at 101 lb, uop 2.3 L/24 hr. Scr down to 0.97.  Provider contacted: Dr. Tenny Craw  REDS Clip  READING= 37%  CHEST RULER = 38 Clip Station = D   Orthodema score = 0 Signs/Symptoms Score   Mild edema, no orthopnea 0 No congestion  Moderate edema, no orthopnea 1 Low-grade orthodema/congestion  Severe edema OR orthopnea 2   Moderate edema and orthopnea 3 High-grade orthodema/congestion  Severe edema AND orthopnea 4    Sherron Monday, PharmD, BCCCP Clinical Pharmacist  Phone: [phone]  Please check AMION for all [facility] Pharmacy phone numbers After 10:00 PM, call Main Pharmacy [phone] 11/16/2019       7:37 AM

## 2019-11-16 NOTE — Progress Notes (Addendum)
NIR Brief Note:  Patient is scheduled for an image-guided cerebral arteriogram with possible embolization of right MCA bifurcation aneurysm tentatively for Wednesday 11/21/2019, pending anesthesia/scheduling. Patient to remain on Brilinta 30 mg BID and Aspirin 81 mg once daily. If patient is to stay in house, will consent/place appropriate pre-procedure orders next week prior to procedure. If patient is discharged, our schedulers will call him to set up this procedure on outpatient basis. Dr. Corliss Skains spoke with Dr. Tenny Craw, both agree with plan.  NIR to follow, please call NIR with questions/concerns.   ADDENDUM: Procedure is scheduled for Thursday 11/22/2019. All above instructions remain the same.   Richard Boga Shearon Clonch, PA-C 11/16/2019, 3:50 PM

## 2019-11-16 NOTE — Progress Notes (Signed)
   Vital Signs MEWS/VS Documentation       11/15/2019 2008 11/15/2019 2020 11/15/2019 2130 11/16/2019 0630   MEWS Score:  2  2  0  2   MEWS Score Color:  Yellow  Yellow  Green  Yellow   Resp:  19  --  --  19   Pulse:  (!) 109  --  84  (!) 102   BP:  96/73  --  101/77  95/73   Temp:  98.2 F (36.8 C)  --  --  --   O2 Device:  Room Air  --  --  Room Air   Level of Consciousness:  --  Alert  --  --            Jeanella Flattery 11/16/2019,6:35 AM

## 2019-11-16 NOTE — Progress Notes (Signed)
ANTICOAGULATION CONSULT NOTE - Follow Up Consult  Pharmacy Consult for Heparin Indication: atrial fibrillation  No Known Allergies  Patient Measurements: Height: 6\' 4"  (193 cm) Weight: 101.2 kg (223 lb) IBW/kg (Calculated) : 86.8 Heparin Dosing Weight: 103.5 kg  Vital Signs: Temp: 97.6 F (36.4 C) (04/02 0630) Temp Source: Oral (04/02 0630) BP: 95/73 (04/02 0630) Pulse Rate: 102 (04/02 0630)  Labs: Recent Labs    11/13/19 1012 11/13/19 1548 11/14/19 0615 11/14/19 0615 11/14/19 2054 11/15/19 0513 11/16/19 0537  HGB  --   --  16.2   < >  --  16.4 15.7  HCT  --   --  51.1  --   --  52.9* 50.0  PLT  --   --  183  --   --  203 214  LABPROT 14.0  --   --   --   --   --   --   INR 1.1  --   --   --   --   --   --   HEPARINUNFRC  --    < > 0.65   < > 0.32 0.40 0.47  CREATININE  --   --  1.12  --   --   --  0.97   < > = values in this interval not displayed.    Estimated Creatinine Clearance: 98.2 mL/min (by C-G formula based on SCr of 0.97 mg/dL).   Medical History: Past Medical History:  Diagnosis Date  . CHF (congestive heart failure) (HCC)   . Gout   . New onset atrial fibrillation [facility])     Assessment: Richard Graham is a 64 yr old male presenting with new-onset atrial fibrillation with CHF. He is not on any anticoagulation PTA. His CHADS2VASC score is 1 (new CHF).  Pt is S/P 4-vessel cerebral arteriogram, with finding of R MCA bifurcation aneurysm. Heparin therapeutic this AM at 0.47 on 1500 units/hour. No issues with infusion or bleeding reported per RN.  Goal of Therapy:  Heparin level 0.3-0.5 units/ml Monitor platelets by anticoagulation protocol: Yes   Plan:  Continue heparin at 1500 units/hr Daily daily heparin level, CBC Monitor for signs/symptoms of bleeding   Cort Dragoo L. 79, PharmD [facility] PGY1 Pharmacy Resident [phone] 11/16/19      7:10 AM  Please check AMION for all [facility] Pharmacy phone numbers After 10:00 PM, call the Main Pharmacy [phone]

## 2019-11-16 NOTE — Discharge Summary (Signed)
Physician Discharge Summary  Richard Graham NGE:[id number] DOB: June 25, 1956 DOA: 11/05/2019  PCP: No primary care provider on file.  Admit date: 11/05/2019 Discharge date: 11/16/2019  Admitted From: Home  Disposition:  Home   Recommendations for Outpatient Follow-up:  1. Follow up with Richard Graham next Thursday for aneurysm embolization 2. Follow up with Richard Graham as directed       Home Health: None  Equipment/Devices: None  Discharge Condition: Good  CODE STATUS: FULL Diet recommendation: Cardiac  Brief/Interim Summary: Richard Graham is a 64 y.o. M with no significant PMHx who presented with 3 weeks exertional SOB, swelling, and orthopnea.  In the ER, noted to be in Afib with RVR.  CXR with edema.  Given IV diltiazem, IV Lasix and admitted to the hospitalist Service.       PRINCIPAL HOSPITAL DIAGNOSIS: Acute systolic CHF     Discharge Diagnoses:   Acute systolic CHF Ischemic cardiomyopathy Admitted and started on IV Lasix.  Echo showed EF 25-30%, new onset.  Normal valves.  Ischemic work up showed chronic total LAD lesion.  Started on OMT.  Diuresed to symptom resolution.  Discharged on aspirin, Crestor, metoprolol and Brilinta (dose 30 mg BID based on P2y activity). BP does not support other GDMT for CHF    New paroxysmal atrial fibrillation CHA2DS2-Vasc 2 given CHF, CVD.  HRs <110 at rest.  Treated with IV heparin in hospital.  Given plan for upcoming embolization, will defer Page until post-procedure.  The risk of oral anticoagulation peri-procedurally was thought to outweigh the very low risk of stroke between now and then.  Continue amiodarone, metoprolol  Hyponatremia Resolved  Thrombocytopenia Resolved  Cerebral aneurysm This was a incidental finding while evaluating the patient for focal neurological deficits.  Stroke was ruled out but he was found to have an 11 mm saccular aneurysm of the right MCA bifurcation  IR consulted, Dr.  Estanislado Graham will perform angiography and possibly embolization next week.          Discharge Instructions  Discharge Instructions    Diet - low sodium heart healthy   Complete by: As directed    Discharge instructions   Complete by: As directed    From Richard Graham: You were admitted for trouble breathing and chest discomfort and fatigue and found to have congestive heart failure and Afib.  Likely, you had a heart attack a long time ago.  This led to weakening of the heart that led to congestive heart failure and the start of atrial fibrillation.  The heart failure is being optimized, and eventually your heart function may improve substantially. The Afib is being controlled, and you should follow closely with Richard Graham office to make sure you don't need to have this reversed with cardioversion.  For your heart blockages that started all this: Continue to take aspirin 81 mg daily  Start taking rosuvastatin/Crestor 5 mg nightly (for cholesterol) Start taking ticagrelor/Brilinta twice daily Start taking metoprolol 50 mg twice daily  For the atrial fibrillation: Start taking metoprolol/Toprol 50 mg twice daily (for heart rate control) Start taking amiodarone/Pacerone --> you will take 200 mg twice daily for 2 weeks, then on Apr 16th reduce to 200 mg once daily until Richard Graham tells you to stop   For the heart failure: Take furosemide 80 mg once daily (take this mid morning)(not twice daily like we discussed) Take potassium 20 mEq daily    Follow up with Richard Graham as indicated  Richard Graham will call you on  Monday to arrange COVID testing prior to your procedure, and let you know where to go the day of the procedure.  Take your aspirin and Brilinta and all your medicines, up to and including on the day of the procedure next Wednesday   Increase activity slowly   Complete by: As directed      Allergies as of 11/16/2019   No Known Allergies     Medication List    TAKE  these medications   amiodarone 200 MG tablet Commonly known as: PACERONE Take 1 tablet (200 mg total) by mouth 2 (two) times daily for 14 days.   amiodarone 200 MG tablet Commonly known as: Pacerone Take 1 tablet (200 mg total) by mouth daily. Start taking on: November 30, 2019   aspirin EC 81 MG EC tablet Generic drug: aspirin Take 81 mg by mouth daily.   colchicine 0.6 MG tablet Take 0.6 mg by mouth 2 (two) times daily as needed (for gout flares).   diclofenac sodium 1 % Gel Commonly known as: VOLTAREN Apply 2 g topically 2 (two) times daily as needed (to affected sites- for arthritis pain).   Fish Oil 1200 MG Caps Take 1,200 mg by mouth every other day.   furosemide 40 MG tablet Commonly known as: LASIX Take 2 tablets (80 mg total) by mouth daily.   metoprolol tartrate 50 MG tablet Commonly known as: LOPRESSOR Take 1 tablet (50 mg total) by mouth 2 (two) times daily.   oxyCODONE-acetaminophen 10-325 MG tablet Commonly known as: Percocet Take 1 tablet by mouth every 4 (four) hours as needed for pain. What changed: Another medication with the same name was removed. Continue taking this medication, and follow the directions you see here.   potassium chloride SA 20 MEQ tablet Commonly known as: KLOR-CON Take 1 tablet (20 mEq total) by mouth daily.   rosuvastatin 5 MG tablet Commonly known as: CRESTOR Take 1 tablet (5 mg total) by mouth daily. Start taking on: November 17, 2019   temazepam 30 MG capsule Commonly known as: RESTORIL Take 1 capsule (30 mg total) by mouth at bedtime as needed for sleep.   ticagrelor 60 MG Tabs tablet Commonly known as: BRILINTA Take 0.5 tablets (30 mg total) by mouth 2 (two) times daily.       No Known Allergies  Consultations:  Cardiology  IR   Procedures/Studies: CT HEAD WO CONTRAST  Addendum Date: 11/06/2019   ADDENDUM REPORT: 11/06/2019 20:21 ADDENDUM: Study discussed by telephone with Provider M. Denny on 11/06/2019 at 2004  hours. Electronically Signed   By: Richard Graham M.D.   On: 11/06/2019 20:21   Result Date: 11/06/2019 CLINICAL DATA:  64 year old male with acute neurologic deficit. EXAM: CT HEAD WITHOUT CONTRAST TECHNIQUE: Contiguous axial images were obtained from the base of the skull through the vertex without intravenous contrast. COMPARISON:  None. FINDINGS: Brain: Abnormal rounded area of hypodensity in the anterior right temporal lobe, although seems to be inseparable from the right MCA vessels in that region (coronal image 28). No associated temporal lobe edema. No significant mass effect. Cerebral volume is within normal limits for age. Normal gray-white matter differentiation elsewhere in the brain. No ventriculomegaly, midline shift or intracranial mass effect. No convincing acute intracranial hemorrhage. No cortically based acute infarct identified. Vascular: Mild Calcified atherosclerosis at the skull base. Suspected large, approximately 10 mm saccular aneurysm of the right MCA bifurcation corresponding to the right temporal lobe hyperdensity described above. No other No suspicious intracranial vascular hyperdensity. Skull:  Negative. Sinuses/Orbits: Visualized paranasal sinuses and mastoids are clear. Other: Visualized orbits and scalp soft tissues are within normal limits. IMPRESSION: 1. Plain CT appearance suspicious for a Large Right MCA bifurcation aneurysm, estimated at 10 mm. No acute identified to suggest aneurysm rupture. And otherwise negative noncontrast CT appearance of the brain. 2. Considering symptoms of acute neurologic deficit, follow-up noncontrast Head MRI and Head MRA may be the best next imaging step. Electronically Signed: By: Richard Graham M.D. On: 11/06/2019 19:23   MR ANGIO HEAD WO CONTRAST  Result Date: 11/07/2019 CLINICAL DATA:  64 year old male with initial noncontrast head CT yesterday suggesting a large right MCA bifurcation aneurysm. Found to be in atrial fibrillation this admission with CHF.  Improving encephalopathy, likely metabolic. EXAM: MRA HEAD WITHOUT CONTRAST TECHNIQUE: Angiographic images of the Circle of Willis were obtained using MRA technique without intravenous contrast. COMPARISON:  Noncontrast head CT yesterday. FINDINGS: No significant intracranial mass effect.  No ventriculomegaly. Antegrade flow in the posterior circulation with codominant distal vertebral arteries. No distal vertebral stenosis. Patent PICA origins and vertebrobasilar junction. Patent, mildly tortuous basilar artery without stenosis. Patent SCA and PCA origins which are mildly ectatic. Posterior communicating arteries are diminutive or absent. Bilateral PCA branches are within normal limits. Antegrade flow in the anterior circulation, both ICA siphons. Mild to moderate siphon ectasia, no siphon stenosis. Ophthalmic artery origins are within normal limits, the right is mildly tortuous. Patent and mildly ectatic carotid termini, MCA and ACA origins. The proximal ACAs are also mildly ectatic. No anterior communicating artery aneurysm. Visible ACA branches are tortuous but otherwise negative. Left MCA M1 segment and bifurcation are patent without stenosis. Visible left MCA branches are within normal limits. Right MCA M1 segment is patent without stenosis. There is an 11 mm diameter saccular aneurysm directed inferiorly and slightly laterally from the caudal aspect of the right MCA bifurcation. This has a broad aneurysm neck as seen on series 352, image 9. But beyond the bifurcation the visible right MCA branches are within normal limits. IMPRESSION: 1. Confirmed relatively large 11 mm Saccular Aneurysm of the Right MCA bifurcation. Recommend Neuro-endovascular NIR or Neurosurgery consultation to evaluate the appropriateness of potential treatment. 2. Superimposed generalized intracranial artery ectasia, but no arterial stenosis or branch occlusion identified. Electronically Signed   By: Richard Graham M.D.   On: 11/07/2019 16:13    MR BRAIN WO CONTRAST  Result Date: 11/07/2019 CLINICAL DATA:  64 year old male with initial noncontrast head CT yesterday suggesting a large right MCA bifurcation aneurysm. Found to be in atrial fibrillation this admission with CHF. Improving encephalopathy, likely metabolic. EXAM: MRI HEAD WITHOUT CONTRAST TECHNIQUE: Multiplanar, multiecho pulse sequences of the brain and surrounding structures were obtained without intravenous contrast. COMPARISON:  intracranial MRA today reported separately. Head CT yesterday. FINDINGS: Brain: No restricted diffusion to suggest acute infarction. No midline shift, mass effect, evidence of mass lesion, ventriculomegaly, extra-axial collection or acute intracranial hemorrhage. Cervicomedullary junction and pituitary are within normal limits. Wallace Cullens and white matter signal is within normal limits for age throughout the brain. No cortical encephalomalacia or chronic cerebral blood products identified. Vascular: Major intracranial vascular flow voids are preserved. Abnormal 11 mm saccular flow void at the right MCA bifurcation, see comparison MRA. Skull and upper cervical spine: Normal visible cervical spine. Visualized bone marrow signal is within normal limits. Sinuses/Orbits: Negative. Other: Mastoids are clear. Visible internal auditory structures appear normal. Scalp and face soft tissues appear negative. IMPRESSION: 1. Right MCA aneurysm, see MRA today reported  separately. 2. No acute intracranial abnormality and otherwise normal for age noncontrast MRI appearance of the brain. Electronically Signed   By: Richard Graham M.D.   On: 11/07/2019 16:19   CARDIAC CATHETERIZATION  Result Date: 11/12/2019  Mid LAD lesion is 100% stenosed. Chronically occluded with left to left collaterals.  LV end diastolic pressure is mildly elevated.  There is severe left ventricular systolic dysfunction.  The left ventricular ejection fraction is less than 25% by visual estimate.  There is no  aortic valve stenosis.  Hemodynamic findings consistent with mild pulmonary hypertension.  Consider viability study of anterior wall.  If viable, would plan for CTO PCI of LAD.  If no chance of recovery, then medical therapy without revascularization. Explained to wife, Beth [phone]   DG Chest Portable 1 View  Result Date: 11/05/2019 CLINICAL DATA:  64 year old male with increasing shortness of breath x2 weeks. Abdominal and lower extremity edema. Wheezing. Found to be in atrial fibrillation with RVR. EXAM: PORTABLE CHEST 1 VIEW COMPARISON:  CT Abdomen and Pelvis 04/08/2015. FINDINGS: Portable AP semi upright view at 1539 hours. Possible new cardiomegaly since 2016. Other mediastinal contours are within normal limits. Low lung volumes with combined patchy and veiling opacity at the right lung base. Generalized pulmonary vascular congestion. No pneumothorax. No left lung base opacity. No acute osseous abnormality identified. IMPRESSION: 1. Low lung volumes with suspected pulmonary interstitial edema with right lung base atelectasis and/or small pleural effusion. 2. Possible cardiomegaly, which would be new since 2016. Electronically Signed   By: Richard Graham M.D.   On: 11/05/2019 15:54   ECHOCARDIOGRAM COMPLETE  Result Date: 11/06/2019    ECHOCARDIOGRAM REPORT   Patient Name:   MARCEL GARY Devore Date of Exam: 11/06/2019 Medical Rec #:  [id number]         Height:       73.0 in Accession #:    [id number]        Weight:       247.0 lb Date of Birth:  [birth year]         BSA:          2.354 m Patient Age:    63 years          BP:           113/90 mmHg Patient Gender: M                 HR:           107 bpm. Exam Location:  Inpatient Procedure: 2D Echo Indications:    congestive heart failure 428.0  History:        Patient has no prior history of Echocardiogram examinations.                 Arrythmias:Atrial Fibrillation; Signs/Symptoms:Shortness of                 Breath and lower extremity edema.  Sonographer:     Delcie Roch Referring Phys: [id number] RINKA R PAHWANI IMPRESSIONS  1. LV function is severely reduced, 20-25%. The mid anteroseptum into apex is akinetic, almost aneurysmal, consistent with prior LAD infarction (Q waves on EKG). The myocardium is thinned, futher supporting prior infarction. On contrast imaging, there is no LV thrombus seen.  2. Left ventricular ejection fraction, by estimation, is 20 to 25%. The left ventricle has severely decreased function. The left ventricle demonstrates regional wall motion abnormalities (see scoring diagram/findings for description). There is mild concentric left ventricular  hypertrophy. Left ventricular diastolic function could not be evaluated.  3. Right ventricular systolic function is mildly reduced. The right ventricular size is normal. There is mildly elevated pulmonary artery systolic pressure. The estimated right ventricular systolic pressure is 42.2 mmHg.  4. Left atrial size was mildly dilated.  5. The mitral valve is grossly normal. Trivial mitral valve regurgitation. No evidence of mitral stenosis.  6. The aortic valve is tricuspid. Aortic valve regurgitation is not visualized. No aortic stenosis is present. Conclusion(s)/Recommendation(s): Findings consistent with ischemic cardiomyopathy. FINDINGS  Left Ventricle: Left ventricular ejection fraction, by estimation, is 20 to 25%. The left ventricle has severely decreased function. The left ventricle demonstrates regional wall motion abnormalities. Definity contrast agent was given IV to delineate the left ventricular endocardial borders. The left ventricular internal cavity size was normal in size. There is mild concentric left ventricular hypertrophy. Left ventricular diastolic function could not be evaluated due to atrial fibrillation. Left ventricular diastolic function could not be evaluated.  LV Wall Scoring: The mid and distal anterior septum, apical anterior segment, and apex are akinetic. Right Ventricle:  The right ventricular size is normal. No increase in right ventricular wall thickness. Right ventricular systolic function is mildly reduced. There is mildly elevated pulmonary artery systolic pressure. The tricuspid regurgitant velocity  is 2.61 m/s, and with an assumed right atrial pressure of 15 mmHg, the estimated right ventricular systolic pressure is 42.2 mmHg. Left Atrium: Left atrial size was mildly dilated. Right Atrium: Right atrial size was normal in size. Pericardium: There is no evidence of pericardial effusion. Mitral Valve: The mitral valve is grossly normal. Trivial mitral valve regurgitation. No evidence of mitral valve stenosis. Tricuspid Valve: The tricuspid valve is grossly normal. Tricuspid valve regurgitation is mild . No evidence of tricuspid stenosis. Aortic Valve: The aortic valve is tricuspid. Aortic valve regurgitation is not visualized. No aortic stenosis is present. Pulmonic Valve: The pulmonic valve was grossly normal. Pulmonic valve regurgitation is not visualized. No evidence of pulmonic stenosis. Aorta: The aortic root is normal in size and structure. IAS/Shunts: The interatrial septum appears to be lipomatous. No atrial level shunt detected by color flow Doppler. Additional Comments: LV function is severely reduced, 20-25%. The mid anteroseptum into apex is akinetic, almost aneurysmal, consistent with prior LAD infarction (Q waves on EKG). The myocardium is thinned, futher supporting prior infarction. On contrast  imaging, there is no LV thrombus seen.  LEFT VENTRICLE PLAX 2D LVIDd:         5.80 cm LVIDs:         4.60 cm LV PW:         1.20 cm LV IVS:        1.20 cm LVOT diam:     2.30 cm LV SV:         61 LV SV Index:   26 LVOT Area:     4.15 cm  RIGHT VENTRICLE RV S prime:     11.60 cm/s TAPSE (M-mode): 1.4 cm LEFT ATRIUM           Index       RIGHT ATRIUM           Index LA diam:      5.30 cm 2.25 cm/m  RA Area:     17.10 cm LA Vol (A2C): 62.1 ml 26.39 ml/m RA Volume:   46.60  ml  19.80 ml/m LA Vol (A4C): 82.4 ml 35.01 ml/m  AORTIC VALVE LVOT Vmax:   86.20 cm/s LVOT Vmean:  57.800 cm/s LVOT VTI:    0.146 m  AORTA Ao Root diam: 3.40 cm TRICUSPID VALVE TR Peak grad:   27.2 mmHg TR Vmax:        261.00 cm/s  SHUNTS Systemic VTI:  0.15 m Systemic Diam: 2.30 cm Lennie Odor MD Electronically signed by Lennie Odor MD Signature Date/Time: 11/06/2019/4:33:04 PM    Final        Subjective: Feeling well.  No dyspnea, orthopnea, leg swelling.  Discharge Exam: Vitals:   11/16/19 0841 11/16/19 1348  BP: 117/73 108/85  Pulse: 100 87  Resp:  17  Temp:  97.6 F (36.4 C)  SpO2:  98%   Vitals:   11/15/19 2130 11/16/19 0630 11/16/19 0841 11/16/19 1348  BP: 101/77 95/73 117/73 108/85  Pulse: 84 (!) 102 100 87  Resp:  19  17  Temp:  97.6 F (36.4 C)  97.6 F (36.4 C)  TempSrc:  Oral  Oral  SpO2:  93%  98%  Weight:  101.2 kg    Height:        General: Pt is alert, awake, not in acute distress Cardiovascular: RRR, nl S1-S2, no murmurs appreciated.   No LE edema.   Respiratory: Normal respiratory rate and rhythm.  CTAB without rales or wheezes. Abdominal: Abdomen soft and non-tender.  No distension or HSM.   Neuro/Psych: Strength symmetric in upper and lower extremities.  Judgment and insight appear normal.   The results of significant diagnostics from this hospitalization (including imaging, microbiology, ancillary and laboratory) are listed below for reference.     Microbiology: No results found for this or any previous visit (from the past 240 hour(s)).   Labs: BNP (last 3 results) Recent Labs    11/05/19 1616  BNP 318.7*   Basic Metabolic Panel: Recent Labs  Lab 11/10/19 0505 11/10/19 0505 11/11/19 0742 11/11/19 0742 11/12/19 0514 11/12/19 1410 11/12/19 1413 11/13/19 0453 11/14/19 0615 11/16/19 0537  NA 133*   < > 132*   < >  --  132* 133* 135 136 137  K 4.8   < > 4.2   < >  --  4.4 4.3 4.4 3.8 4.4  CL 85*  --  88*  --   --   --   --  90*  90* 95*  CO2 34*  --  31  --   --   --   --  34* 32 31  GLUCOSE 117*  --  100*  --   --   --   --  107* 119* 105*  BUN 13  --  14  --   --   --   --  28* 26* 20  CREATININE 0.94  --  0.91  --   --   --   --  1.02 1.12 0.97  CALCIUM 9.1  --  9.1  --   --   --   --  9.8 9.7 9.3  MG 2.1  --  2.0  --  2.2  --   --   --  2.3  --   PHOS 3.8  --   --   --   --   --   --   --   --   --    < > = values in this interval not displayed.   Liver Function Tests: Recent Labs  Lab 11/10/19 0505  ALBUMIN 3.6   No results for input(s): LIPASE, AMYLASE in the last 168 hours. No results for  input(s): AMMONIA in the last 168 hours. CBC: Recent Labs  Lab 11/12/19 0514 11/12/19 1410 11/12/19 1413 11/13/19 0453 11/14/19 0615 11/15/19 0513 11/16/19 0537  WBC 5.9  --   --  7.9 8.8 9.2 7.9  HGB 14.6   < > 16.3 15.3 16.2 16.4 15.7  HCT 45.8   < > 48.0 48.0 51.1 52.9* 50.0  MCV 93.9  --   --  93.2 94.8 96.0 95.1  PLT 145*  --   --  177 183 203 214   < > = values in this interval not displayed.   Cardiac Enzymes: No results for input(s): CKTOTAL, CKMB, CKMBINDEX, TROPONINI in the last 168 hours. BNP: Invalid input(s): POCBNP CBG: Recent Labs  Lab 11/11/19 1212 11/11/19 1621 11/12/19 1630  GLUCAP 155* 123* 152*   D-Dimer No results for input(s): DDIMER in the last 72 hours. Hgb A1c No results for input(s): HGBA1C in the last 72 hours. Lipid Profile No results for input(s): CHOL, HDL, LDLCALC, TRIG, CHOLHDL, LDLDIRECT in the last 72 hours. Thyroid function studies No results for input(s): TSH, T4TOTAL, T3FREE, THYROIDAB in the last 72 hours.  Invalid input(s): FREET3 Anemia work up No results for input(s): VITAMINB12, FOLATE, FERRITIN, TIBC, IRON, RETICCTPCT in the last 72 hours. Urinalysis    Component Value Date/Time   COLORURINE STRAW (A) 11/05/2019 2134   APPEARANCEUR CLEAR 11/05/2019 2134   LABSPEC 1.004 (L) 11/05/2019 2134   PHURINE 6.0 11/05/2019 2134   GLUCOSEU NEGATIVE  11/05/2019 2134   HGBUR NEGATIVE 11/05/2019 2134   BILIRUBINUR NEGATIVE 11/05/2019 2134   KETONESUR NEGATIVE 11/05/2019 2134   PROTEINUR NEGATIVE 11/05/2019 2134   NITRITE NEGATIVE 11/05/2019 2134   LEUKOCYTESUR NEGATIVE 11/05/2019 2134   Sepsis Labs Invalid input(s): PROCALCITONIN,  WBC,  LACTICIDVEN Microbiology No results found for this or any previous visit (from the past 240 hour(s)).   Time coordinating discharge: 35 minutes The [place] controlled substances registry was reviewed for this patient prior to filling the <5 days supply controlled substances script.      SIGNED:   Alberteen Samhristopher P Ashlea Dusing, MD  [facility] 11/16/2019, 4:30 PM

## 2019-11-16 NOTE — Progress Notes (Addendum)
Progress Note  Patient Name: Richard Graham Date of Encounter: 11/16/2019  Primary Cardiologist: Armanda Magic, MD   Subjective    breathing is OK   No dizziness  No CP     Inpatient Medications    Scheduled Meds: . amiodarone  200 mg Oral BID  . aspirin EC  81 mg Oral Daily  . metoprolol tartrate  50 mg Oral BID  . potassium chloride  40 mEq Oral Daily  . rosuvastatin  5 mg Oral Daily  . sodium chloride flush  3 mL Intravenous Q12H  . sodium chloride flush  3 mL Intravenous Q12H  . ticagrelor  30 mg Oral BID   Continuous Infusions: . sodium chloride    . heparin 1,500 Units/hr (11/16/19 0309)   PRN Meds: sodium chloride, acetaminophen **OR** acetaminophen, acetaminophen, metoprolol tartrate, ondansetron **OR** ondansetron (ZOFRAN) IV, ondansetron (ZOFRAN) IV, oxyCODONE-acetaminophen **AND** oxyCODONE, sodium chloride flush, temazepam   Vital Signs    Vitals:   11/15/19 1216 11/15/19 1533 11/15/19 2008 11/15/19 2130  BP: 108/87 113/90 96/73 101/77  Pulse: (!) 101 89 (!) 109 84  Resp: 15 14 19    Temp: 97.6 F (36.4 C) 98.6 F (37 C) 98.2 F (36.8 C)   TempSrc: Oral Oral Oral   SpO2: 100% 98% 96%   Weight:      Height:        Intake/Output Summary (Last 24 hours) at 11/16/2019 0620 Last data filed at 11/16/2019 0309 Gross per 24 hour  Intake 1315.02 ml  Output 2325 ml  Net -1009.98 ml   Net negative  37=6 L  Last 3 Weights 11/15/2019 11/14/2019 11/13/2019  Weight (lbs) 223 lb 4.8 oz 221 lb 12.8 oz 228 lb 6.3 oz  Weight (kg) 101.288 kg 100.608 kg 103.6 kg      Telemetry    Afib  90s to 100s  - Personally Reviewed  ECG    No new ECG tracing today. - Personally Reviewed  Physical Exam   GEN: No acute distress.   Neck: Supple. Difficult to assess JVP is not elevated      Cardiac: Irregularly irregular rhythm, Respiratory:  CTA   GI: Soft,.  MS:  No signif LE edema   Labs    High Sensitivity Troponin:   Recent Labs  Lab 11/05/19 1731  11/05/19 1822 11/05/19 2053  TROPONINIHS 11 14 12       Chemistry Recent Labs  Lab 11/10/19 0505 11/10/19 0505 11/11/19 0742 11/12/19 1410 11/12/19 1413 11/13/19 0453 11/14/19 0615  NA 133*   < > 132*   < > 133* 135 136  K 4.8   < > 4.2   < > 4.3 4.4 3.8  CL 85*   < > 88*  --   --  90* 90*  CO2 34*   < > 31  --   --  34* 32  GLUCOSE 117*   < > 100*  --   --  107* 119*  BUN 13   < > 14  --   --  28* 26*  CREATININE 0.94   < > 0.91  --   --  1.02 1.12  CALCIUM 9.1   < > 9.1  --   --  9.8 9.7  ALBUMIN 3.6  --   --   --   --   --   --   GFRNONAA >60   < > >60  --   --  >60 >60  GFRAA >60   < > >  60  --   --  >60 >60  ANIONGAP 14   < > 13  --   --  11 14   < > = values in this interval not displayed.     Hematology Recent Labs  Lab 11/13/19 0453 11/14/19 0615 11/15/19 0513  WBC 7.9 8.8 9.2  RBC 5.15 5.39 5.51  HGB 15.3 16.2 16.4  HCT 48.0 51.1 52.9*  MCV 93.2 94.8 96.0  MCH 29.7 30.1 29.8  MCHC 31.9 31.7 31.0  RDW 14.0 14.1 13.9  PLT 177 183 203    BNP No results for input(s): BNP, PROBNP in the last 168 hours.   DDimer No results for input(s): DDIMER in the last 168 hours.   Radiology    No results found.  Cardiac Studies   L heart cath  11/12/19   Mid LAD lesion is 100% stenosed. Chronically occluded with left to left collaterals.  LV end diastolic pressure is mildly elevated.  There is severe left ventricular systolic dysfunction.  The left ventricular ejection fraction is less than 25% by visual estimate.  There is no aortic valve stenosis.  Hemodynamic findings consistent with mild pulmonary hypertension.   Consider viability study of anterior wall.  If viable, would plan for CTO PCI of LAD.  If no chance of recovery, then medical therapy without revascularization.    Echocardiogram 11/06/2019: Impressions: 1. LV function is severely reduced, 20-25%. The mid anteroseptum into  apex is akinetic, almost aneurysmal, consistent with prior LAD  infarction  (Q waves on EKG). The myocardium is thinned, futher supporting prior  infarction. On contrast imaging, there  is no LV thrombus seen.  2. Left ventricular ejection fraction, by estimation, is 20 to 25%. The  left ventricle has severely decreased function. The left ventricle  demonstrates regional wall motion abnormalities (see scoring  diagram/findings for description). There is mild  concentric left ventricular hypertrophy. Left ventricular diastolic  function could not be evaluated.  3. Right ventricular systolic function is mildly reduced. The right  ventricular size is normal. There is mildly elevated pulmonary artery  systolic pressure. The estimated right ventricular systolic pressure is  06.2 mmHg.  4. Left atrial size was mildly dilated.  5. The mitral valve is grossly normal. Trivial mitral valve  regurgitation. No evidence of mitral stenosis.  6. The aortic valve is tricuspid. Aortic valve regurgitation is not  visualized. No aortic stenosis is present.   Conclusion(s)/Recommendation(s): Findings consistent with ischemic  cardiomyopathy.  Patient Profile   [name] is a 64 y.o. male with a history of pre-diabetes and obesity who is being seen for evaluation of new onset atrial fibrillation and CHF at the request of Dr. Doristine Bosworth.   Assessment & Plan    New Onset Acute Systolic CHF  Left heart cath results above with LAD 100% occluded   Pt had event of pain  Remote  The LAD territory fills via left to left collaterals  May have viability in region but no CP        Plan for medical Rx for now  At some pt consider viability eval with poss intervention   He is having no CP     Send home on 80 mg lasix daily with KCL  Pt to be admitted next week  Will be in neuro ICU after  They will call for recommendations     Atrial fibrillation   CHADSVASc score is 2      Rates are fair   On  metoprolol and amiodarone   Heart rates have not been too bad  BP low at  times which llimts escalation of dosing    Continued amiodarone will slow rate over time  Keep on current dosing    On heparin for now.  Now he is on ASA and Brilinta as well When goes home for short term probably keep on this and not add Xarelto given this would be triple Rx and he would have to come off on Sunday  No lovenox  Plan for cardioversion weeks possibly  after Rx of aneurysm.  Will need triple Rx possibly afterthat time      R MCA aneurysm: Plan for intervention by IR next week  Wed Started on antiplt Rx  Spoke to Dr Corliss Skains   He has decreased Brilinta dose as platelt reactivity has been low   Will recheck today    HL  Lipids from 3/25 LDL 56  HDL 40  Was not on statin   Will start Crestor for antiinflam Rx / plaque stabilizine   For questions or updates, please contact [facility] Please consult [email or web address] for contact info under      Signed, Dietrich Pates, MD  11/16/2019, 6:20 AM

## 2019-11-16 NOTE — Care Management (Signed)
Pt deemed stable for discharge home today. Benefit checks submitted earlier in the week for Eliqus and Xarelto however pt being discharged home on Brilinta.  CM contacted pharmacy of choice and provided benefit card for Brilinta - pts copay will be $200 a month with benefit applied.  Per pharmacy pt still needs to satisfy his $2000 deductible .  CM informed pt of copay - pt states he can afford copay.  No other CM needs determined - CM signing off

## 2019-11-19 ENCOUNTER — Other Ambulatory Visit (HOSPITAL_COMMUNITY)
Admission: RE | Admit: 2019-11-19 | Discharge: 2019-11-19 | Disposition: A | Discharge status: Home / Self Care | Payer: Commercial Managed Care - PPO | Source: Ambulatory Visit | Attending: Interventional Radiology | Admitting: Interventional Radiology

## 2019-11-19 ENCOUNTER — Other Ambulatory Visit (HOSPITAL_COMMUNITY): Payer: Self-pay | Admitting: Interventional Radiology

## 2019-11-19 DIAGNOSIS — I671 Cerebral aneurysm, nonruptured: Secondary | ICD-10-CM

## 2019-11-19 LAB — SARS CORONAVIRUS 2 (TAT 6-24 HRS): SARS Coronavirus 2: NEGATIVE

## 2019-11-21 ENCOUNTER — Other Ambulatory Visit: Payer: Self-pay

## 2019-11-21 ENCOUNTER — Other Ambulatory Visit: Payer: Self-pay | Admitting: Radiology

## 2019-11-21 ENCOUNTER — Encounter (HOSPITAL_COMMUNITY): Payer: Self-pay | Admitting: Interventional Radiology

## 2019-11-21 NOTE — Progress Notes (Signed)
Patient denies shortness of breath, fever, cough and chest pain.  PCP - Dr Tiburcio Pea until 08/17/19, no current PCP- looking for a new PCP  Cardiologist - Dr Dietrich Pates  Chest x-ray - 11/05/19, 1 View EKG - 11/05/19 Stress Test - n/a ECHO - 11/06/19 Cardiac Cath - 11/12/19  Blood Thinner Instructions:  Follow your surgeon's instructions to continue brilinta.  Aspirin Instructions: Follow your surgeon's instructions to continue ASA.  Anesthesia review: Yes  STOP now taking any Aleve, Naproxen, Ibuprofen, Motrin, Advil, Goody's, BC's, all herbal medications, fish oil, and all vitamins.   Coronavirus Screening Covid test on 11/19/19 was negative.  Patient verbalized understanding of instructions that were given via phone.

## 2019-11-21 NOTE — Anesthesia Preprocedure Evaluation (Addendum)
Anesthesia Evaluation  Patient identified by MRN, date of birth, ID band Patient awake    Reviewed: Allergy & Precautions, NPO status , Patient's Chart, lab work & pertinent test results  Airway Mallampati: II  TM Distance: >3 FB Neck ROM: Full    Dental no notable dental hx. (+) Teeth Intact, Dental Advisory Given   Pulmonary former smoker,    Pulmonary exam normal breath sounds clear to auscultation       Cardiovascular Exercise Tolerance: Good +CHF  Normal cardiovascular exam+ dysrhythmias Atrial Fibrillation  Rhythm:Regular Rate:Normal  11/12/19 Echo  . LV function is severely reduced, 20-25%. The mid anteroseptum into  apex is akinetic, almost aneurysmal, consistent with prior LAD infarction  (Q waves on EKG). The myocardium is thinned, futher supporting prior  infarction. On contrast imaging, there  is no LV thrombus seen.  2. Left ventricular ejection fraction, by estimation, is 20 to 25%. The  left ventricle has severely decreased function. The left ventricle  demonstrates regional wall motion abnormalities (see scoring  diagram/findings for description). There is mild  concentric left ventricular hypertrophy. Left ventricular diastolic  function could not be evaluated.  3. Right ventricular systolic function is mildly reduced. The right  ventricular size is normal. There is mildly elevated pulmonary artery  systolic pressure. The estimated right ventricular systolic pressure is  42.2 mmHg.  4. Left atrial size was mildly dilated.  5. The mitral valve is grossly normal. Trivial mitral valve  regurgitation. No evidence of mitral stenosis.  6. The aortic valve is tricuspid. Aortic valve regurgitation is not  visualized.   Neuro/Psych negative neurological ROS  negative psych ROS   GI/Hepatic negative GI ROS, Neg liver ROS,   Endo/Other  negative endocrine ROS  Renal/GU negative Renal ROS      Musculoskeletal negative musculoskeletal ROS (+)   Abdominal   Peds  Hematology negative hematology ROS (+)   Anesthesia Other Findings   Reproductive/Obstetrics                           Anesthesia Physical Anesthesia Plan  ASA: IV  Anesthesia Plan: General   Post-op Pain Management:    Induction: Intravenous  PONV Risk Score and Plan: 3 and Treatment may vary due to age or medical condition, Ondansetron, Dexamethasone and Midazolam  Airway Management Planned: Oral ETT  Additional Equipment: Arterial line  Intra-op Plan:   Post-operative Plan: Extubation in OR and Possible Post-op intubation/ventilation  Informed Consent: I have reviewed the patients History and Physical, chart, labs and discussed the procedure including the risks, benefits and alternatives for the proposed anesthesia with the patient or authorized representative who has indicated his/her understanding and acceptance.     Dental advisory given  Plan Discussed with:   Anesthesia Plan Comments: (Recently admitted 3/22-11/16/19 for acute HFrEF, ischemic cardiomyopathy, new onset afib with RVR. Portion of discharge summary copied below:  Acute systolic CHF, Ischemic cardiomyopathy: Admitted and started on IV Lasix. Echo showed EF 25-30%, new onset. Normal valves. Ischemic work up showed chronic total LAD lesion. Started on OMT. Diuresed to symptom resolution.  Discharged on aspirin, Crestor, metoprolol and Brilinta (dose 30 mg BID based on P2y activity). BP does not support other GDMT for CHF  New paroxysmal atrial fibrillation: CHA2DS2-Vasc2 given CHF, CVD. HRs <110 at rest.  Treated with IV heparin in hospital. Given plan for upcoming embolization, will defer OAC until post-procedure.  The risk of oral anticoagulation peri-procedurally was thought to  outweigh the very low risk of stroke between now and then. Continueamiodarone, metoprolol  GA w Aline   Hyponatremia:  Resolved  Thrombocytopenia: Resolved  Cerebral aneurysm: This was a incidental finding while evaluating the patient for focal neurological deficits. 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 IR consulted, Dr. [name] will perform angiography and possibly embolization next week.   While admitted, cardiology discussed plan for IR intervention with Dr. [name]. Plan is to reconsult cardiology for recommendations while pt admitted postoperatively. Possible cardioversion after IR procedure.   BMP and CBC from 11/16/19 reviewed, unremarkable.   Will need DOS labs and eval.   EKG 11/05/19: Sinus tachycardia with irregular rate/likely afib. Rate 104. RBBB and LAFB. Probable anteroseptal infarct, old  R/L heart cath 11/12/19: Mid LAD lesion is 100% stenosed. Chronically occluded with left to left collaterals. LV end diastolic pressure is mildly elevated. There is severe left ventricular systolic dysfunction. The left ventricular ejection fraction is less than 25% by visual estimate. There is no aortic valve stenosis. Hemodynamic findings consistent with mild pulmonary hypertension.   Consider viability study of anterior wall.  If viable, would plan for CTO PCI of LAD.  If no chance of recovery, then medical therapy without revascularization.    TTE 11/06/19: 1. LV function is severely reduced, 20-25%. The mid anteroseptum into  apex is akinetic, almost aneurysmal, consistent with prior LAD infarction  (Q waves on EKG). The myocardium is thinned, futher supporting prior  infarction. On contrast imaging, there  is no LV thrombus seen.  2. Left ventricular ejection fraction, by estimation, is 20 to 25%. The  left ventricle has severely decreased function. The left ventricle  demonstrates regional wall motion abnormalities (see scoring  diagram/findings for description). There is mild  concentric left ventricular hypertrophy.  Left ventricular diastolic  function could not be evaluated.  3. Right ventricular systolic function is mildly reduced. The right  ventricular size is normal. There is mildly elevated pulmonary artery  systolic pressure. The estimated right ventricular systolic pressure is  88.4 mmHg.  4. Left atrial size was mildly dilated.  5. The mitral valve is grossly normal. Trivial mitral valve  regurgitation. No evidence of mitral stenosis.  6. The aortic valve is tricuspid. Aortic valve regurgitation is not  visualized. No aortic stenosis is present. )     Anesthesia Quick Evaluation

## 2019-11-21 NOTE — Progress Notes (Signed)
Anesthesia Chart Review: Same day workup  Recently admitted 3/22-11/16/19 for acute HFrEF, ischemic cardiomyopathy, new onset afib with RVR. Portion of discharge summary copied below:  Acute systolic CHF, Ischemic cardiomyopathy: Admitted and started on IV Lasix. Echo showed EF 25-30%, new onset. Normal valves. Ischemic work up showed chronic total LAD lesion. Started on OMT. Diuresed to symptom resolution.  Discharged on aspirin, Crestor, metoprolol and Brilinta (dose 30 mg BID based on P2y activity). BP does not support other GDMT for CHF  New paroxysmal atrial fibrillation: CHA2DS2-Vasc2 given CHF, CVD. HRs <110 at rest.  Treated with IV heparin in hospital. Given plan for upcoming embolization, will defer OAC until post-procedure.  The risk of oral anticoagulation peri-procedurally was thought to outweigh the very low risk of stroke between now and then. Continueamiodarone, metoprolol  Hyponatremia: Resolved  Thrombocytopenia: Resolved  Cerebral aneurysm: This was a incidental finding while evaluating the patient for focal neurological deficits. 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 IR consulted, Dr. Corliss Skains will perform angiography and possibly embolization next week.   While admitted, cardiology discussed plan for IR intervention with Dr. Corliss Skains. Plan is to reconsult cardiology for recommendations while pt admitted postoperatively. Possible cardioversion after IR procedure.   BMP and CBC from 11/16/19 reviewed, unremarkable.   Will need DOS labs and eval.   EKG 11/05/19: Sinus tachycardia with irregular rate/likely afib. Rate 104. RBBB and LAFB. Probable anteroseptal infarct, old  R/L heart cath 11/12/19:  Mid LAD lesion is 100% stenosed. Chronically occluded with left to left collaterals.  LV end diastolic pressure is mildly elevated.  There is severe left ventricular systolic dysfunction.  The left  ventricular ejection fraction is less than 25% by visual estimate.  There is no aortic valve stenosis.  Hemodynamic findings consistent with mild pulmonary hypertension.   Consider viability study of anterior wall.  If viable, would plan for CTO PCI of LAD.  If no chance of recovery, then medical therapy without revascularization.    TTE 11/06/19: 1. LV function is severely reduced, 20-25%. The mid anteroseptum into  apex is akinetic, almost aneurysmal, consistent with prior LAD infarction  (Q waves on EKG). The myocardium is thinned, futher supporting prior  infarction. On contrast imaging, there  is no LV thrombus seen.  2. Left ventricular ejection fraction, by estimation, is 20 to 25%. The  left ventricle has severely decreased function. The left ventricle  demonstrates regional wall motion abnormalities (see scoring  diagram/findings for description). There is mild  concentric left ventricular hypertrophy. Left ventricular diastolic  function could not be evaluated.  3. Right ventricular systolic function is mildly reduced. The right  ventricular size is normal. There is mildly elevated pulmonary artery  systolic pressure. The estimated right ventricular systolic pressure is  42.2 mmHg.  4. Left atrial size was mildly dilated.  5. The mitral valve is grossly normal. Trivial mitral valve  regurgitation. No evidence of mitral stenosis.  6. The aortic valve is tricuspid. Aortic valve regurgitation is not  visualized. No aortic stenosis is present.    Richard Graham [facility] Stay [facility]/Anesthesiology Phone [phone] 11/21/2019 1:49 PM

## 2019-11-22 ENCOUNTER — Ambulatory Visit (HOSPITAL_COMMUNITY)
Admission: RE | Admit: 2019-11-22 | Discharge: 2019-11-22 | Disposition: A | Discharge status: Home / Self Care | Payer: Commercial Managed Care - PPO | Source: Ambulatory Visit | Attending: Interventional Radiology | Admitting: Interventional Radiology

## 2019-11-22 ENCOUNTER — Encounter (HOSPITAL_COMMUNITY)
Admission: RE | Disposition: A | Discharge status: Home / Self Care | Payer: Self-pay | Source: Home / Self Care | Attending: Interventional Radiology

## 2019-11-22 ENCOUNTER — Ambulatory Visit (HOSPITAL_COMMUNITY): Payer: Commercial Managed Care - PPO | Admitting: Certified Registered"

## 2019-11-22 ENCOUNTER — Inpatient Hospital Stay (HOSPITAL_COMMUNITY)
Admission: RE | Admit: 2019-11-22 | Discharge: 2019-11-23 | DRG: 026 | Disposition: A | Discharge status: Home / Self Care | Payer: Commercial Managed Care - PPO | Attending: Interventional Radiology | Admitting: Interventional Radiology

## 2019-11-22 ENCOUNTER — Encounter (HOSPITAL_COMMUNITY): Payer: Self-pay | Admitting: Interventional Radiology

## 2019-11-22 DIAGNOSIS — I255 Ischemic cardiomyopathy: Secondary | ICD-10-CM | POA: Diagnosis present

## 2019-11-22 DIAGNOSIS — Z20822 Contact with and (suspected) exposure to covid-19: Secondary | ICD-10-CM | POA: Diagnosis present

## 2019-11-22 DIAGNOSIS — I5022 Chronic systolic (congestive) heart failure: Secondary | ICD-10-CM | POA: Diagnosis present

## 2019-11-22 DIAGNOSIS — M109 Gout, unspecified: Secondary | ICD-10-CM | POA: Diagnosis present

## 2019-11-22 DIAGNOSIS — I671 Cerebral aneurysm, nonruptured: Secondary | ICD-10-CM | POA: Diagnosis present

## 2019-11-22 DIAGNOSIS — I4819 Other persistent atrial fibrillation: Secondary | ICD-10-CM | POA: Diagnosis not present

## 2019-11-22 DIAGNOSIS — Z7982 Long term (current) use of aspirin: Secondary | ICD-10-CM | POA: Diagnosis not present

## 2019-11-22 DIAGNOSIS — Z87891 Personal history of nicotine dependence: Secondary | ICD-10-CM | POA: Diagnosis not present

## 2019-11-22 DIAGNOSIS — I480 Paroxysmal atrial fibrillation: Secondary | ICD-10-CM | POA: Diagnosis present

## 2019-11-22 DIAGNOSIS — I2582 Chronic total occlusion of coronary artery: Secondary | ICD-10-CM | POA: Diagnosis present

## 2019-11-22 DIAGNOSIS — Z7902 Long term (current) use of antithrombotics/antiplatelets: Secondary | ICD-10-CM

## 2019-11-22 DIAGNOSIS — R7303 Prediabetes: Secondary | ICD-10-CM | POA: Diagnosis present

## 2019-11-22 DIAGNOSIS — I2510 Atherosclerotic heart disease of native coronary artery without angina pectoris: Secondary | ICD-10-CM | POA: Diagnosis present

## 2019-11-22 DIAGNOSIS — Z79899 Other long term (current) drug therapy: Secondary | ICD-10-CM

## 2019-11-22 HISTORY — DX: Prediabetes: R73.03

## 2019-11-22 HISTORY — PX: IR TRANSCATH/EMBOLIZ: IMG695

## 2019-11-22 HISTORY — PX: IR NEURO EACH ADD'L AFTER BASIC UNI RIGHT (MS): IMG5374

## 2019-11-22 HISTORY — PX: IR CT HEAD LTD: IMG2386

## 2019-11-22 HISTORY — PX: IR ANGIO INTRA EXTRACRAN SEL INTERNAL CAROTID UNI R MOD SED: IMG5362

## 2019-11-22 HISTORY — PX: RADIOLOGY WITH ANESTHESIA: SHX6223

## 2019-11-22 HISTORY — PX: IR ANGIOGRAM FOLLOW UP STUDY: IMG697

## 2019-11-22 HISTORY — PX: IR 3D INDEPENDENT WKST: IMG2385

## 2019-11-22 LAB — BASIC METABOLIC PANEL
Anion gap: 15 (ref 5–15)
BUN: 23 mg/dL (ref 8–23)
CO2: 27 mmol/L (ref 22–32)
Calcium: 9.6 mg/dL (ref 8.9–10.3)
Chloride: 95 mmol/L — ABNORMAL LOW (ref 98–111)
Creatinine, Ser: 1.04 mg/dL (ref 0.61–1.24)
GFR calc Af Amer: >60 mL/min (ref >60)
GFR calc non Af Amer: >60 mL/min (ref >60)
Glucose, Bld: 113 mg/dL — ABNORMAL HIGH (ref 70–99)
Potassium: 4.4 mmol/L (ref 3.5–5.1)
Sodium: 137 mmol/L (ref 135–145)

## 2019-11-22 LAB — URINALYSIS, COMPLETE (UACMP) WITH MICROSCOPIC
Bacteria, UA: NONE SEEN
Bilirubin Urine: NEGATIVE
Glucose, UA: NEGATIVE mg/dL
Hgb urine dipstick: NEGATIVE
Ketones, ur: NEGATIVE mg/dL
Leukocytes,Ua: NEGATIVE
Nitrite: NEGATIVE
Protein, ur: NEGATIVE mg/dL
Specific Gravity, Urine: 1.014 (ref 1.005–1.030)
pH: 6 (ref 5.0–8.0)

## 2019-11-22 LAB — CBC WITH DIFFERENTIAL/PLATELET
Abs Immature Granulocytes: 0.04 10*3/uL (ref 0.00–0.07)
Basophils Absolute: 0.1 10*3/uL (ref 0.0–0.1)
Basophils Relative: 1 %
Eosinophils Absolute: 0.3 10*3/uL (ref 0.0–0.5)
Eosinophils Relative: 4 %
HCT: 49.2 % (ref 39.0–52.0)
Hemoglobin: 15.4 g/dL (ref 13.0–17.0)
Immature Granulocytes: 1 %
Lymphocytes Relative: 30 %
Lymphs Abs: 2.3 10*3/uL (ref 0.7–4.0)
MCH: 29.6 pg (ref 26.0–34.0)
MCHC: 31.3 g/dL (ref 30.0–36.0)
MCV: 94.4 fL (ref 80.0–100.0)
Monocytes Absolute: 0.7 10*3/uL (ref 0.1–1.0)
Monocytes Relative: 9 %
Neutro Abs: 4.2 10*3/uL (ref 1.7–7.7)
Neutrophils Relative %: 55 %
Platelets: 288 10*3/uL (ref 150–400)
RBC: 5.21 MIL/uL (ref 4.22–5.81)
RDW: 14.2 % (ref 11.5–15.5)
WBC: 7.5 10*3/uL (ref 4.0–10.5)
nRBC: 0 % (ref 0.0–0.2)

## 2019-11-22 LAB — PROTIME-INR
INR: 1.1 (ref 0.8–1.2)
Prothrombin Time: 13.7 seconds (ref 11.4–15.2)

## 2019-11-22 LAB — HEPARIN LEVEL (UNFRACTIONATED): Heparin Unfractionated: 0.15 IU/mL — ABNORMAL LOW (ref 0.30–0.70)

## 2019-11-22 LAB — POCT ACTIVATED CLOTTING TIME
Activated Clotting Time: 169 seconds
Activated Clotting Time: 208 seconds

## 2019-11-22 LAB — PLATELET INHIBITION P2Y12: Platelet Function  P2Y12: 2 [PRU] — ABNORMAL LOW (ref 182–335)

## 2019-11-22 LAB — MRSA PCR SCREENING: MRSA by PCR: NEGATIVE

## 2019-11-22 IMAGING — XA IR TRANSCATH EMBOLIZATION
2 series · 9 of 24 positions shown · non-contrast
Comparison: Diagnostic arteriogram [DATE].

CLINICAL DATA: Discovery of a large right middle cerebral artery
aneurysm during recent hospitalization for congestive heart failure.
Family history of intracranial aneurysms. Patient with atrial
fibrillation requiring anticoagulation.

EXAM:
TRANSCATHETER THERAPY EMBOLIZATION; IR ANGIO INTRA EXTRACRAN SEL
INTERNAL CAROTID UNI RIGHT MOD SED; IR NEURO EACH ADD'L AFTER BASIC
UNI RIGHT (MS); WORKSTATION 3D RECONSTRUCTION; ARTERIOGRAPHY; IR CT
HEAD LIMITED
TECHNIQUE: Informed written consent was obtained from the patient after a
thorough discussion of the procedural risks, benefits and
alternatives. All questions were addressed. Maximal Sterile Barrier
Technique was utilized including caps, mask, sterile gowns, sterile
gloves, sterile drape, hand hygiene and skin antiseptic. A timeout
was performed prior to the initiation of the procedure.

[Series 14: axial · axial · 28.7mm · 0.44mm/px · 1 of 34 slices shown]
[im 17/34]
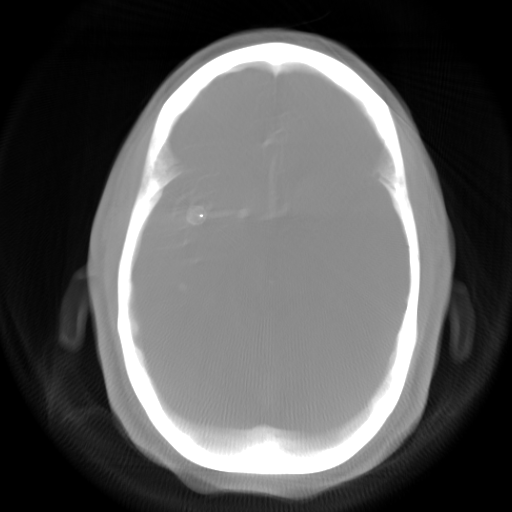

[Series 300: dr. (person_name) · 8 of 262 slices shown]
[im 14/262]
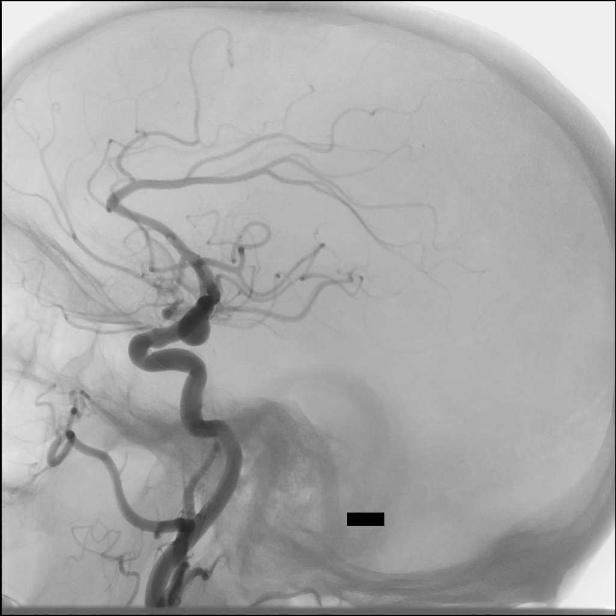
[im 40/262]
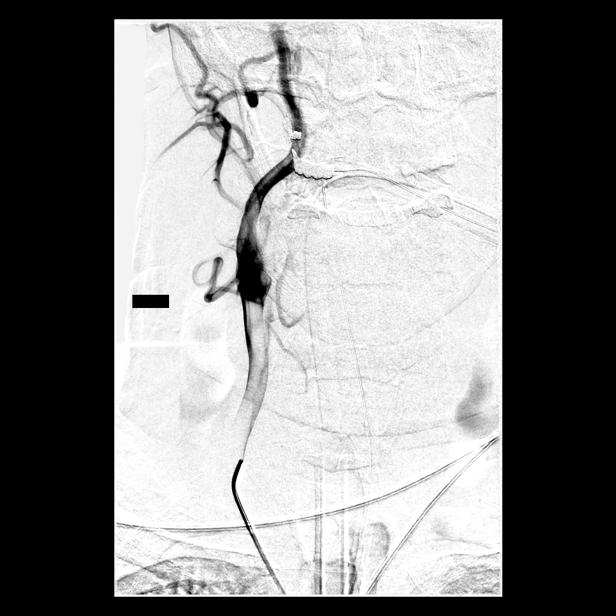
[im 79/262]
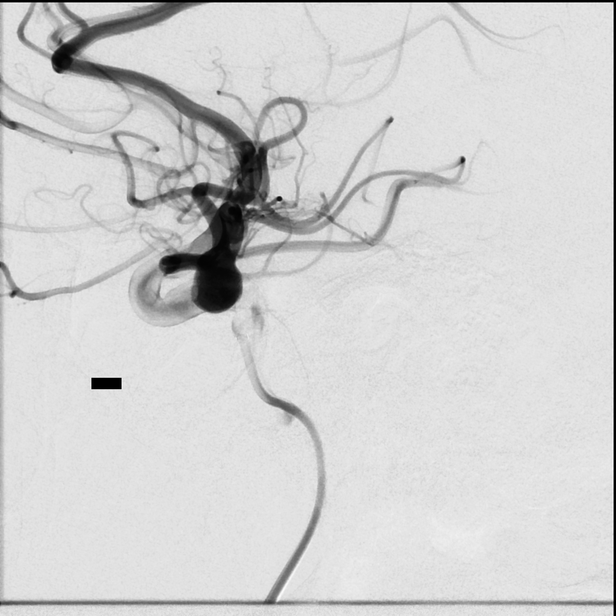
[im 118/262]
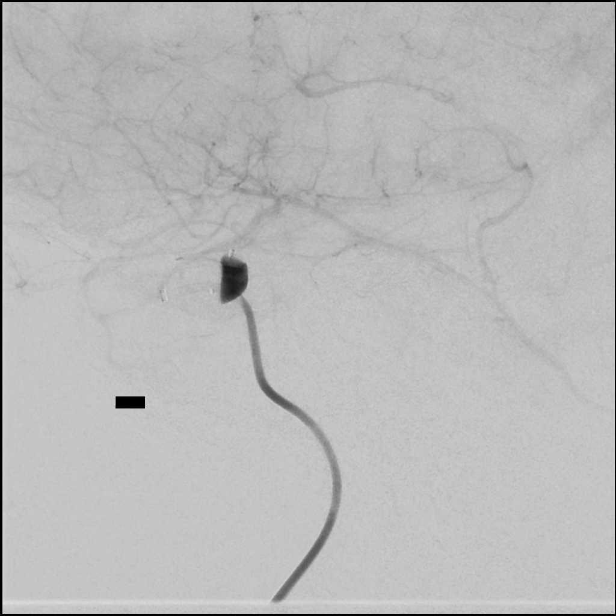
[im 144/262]
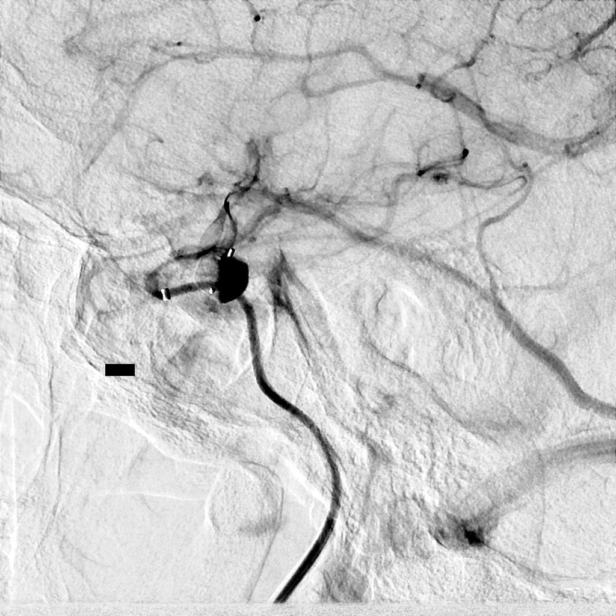
[im 183/262]
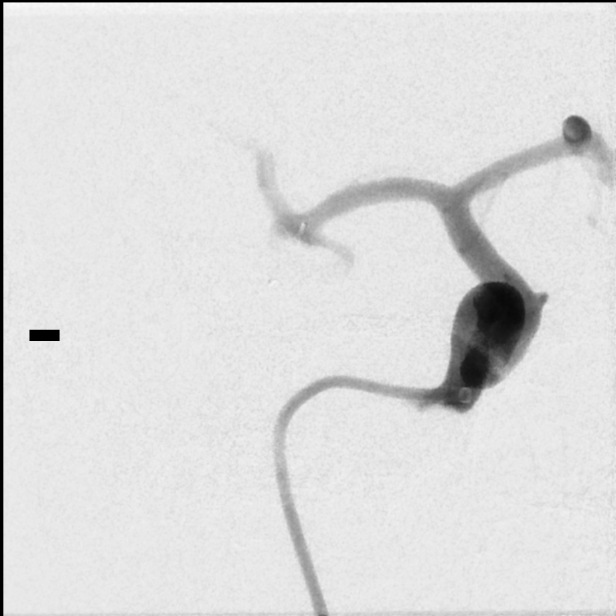
[im 222/262]
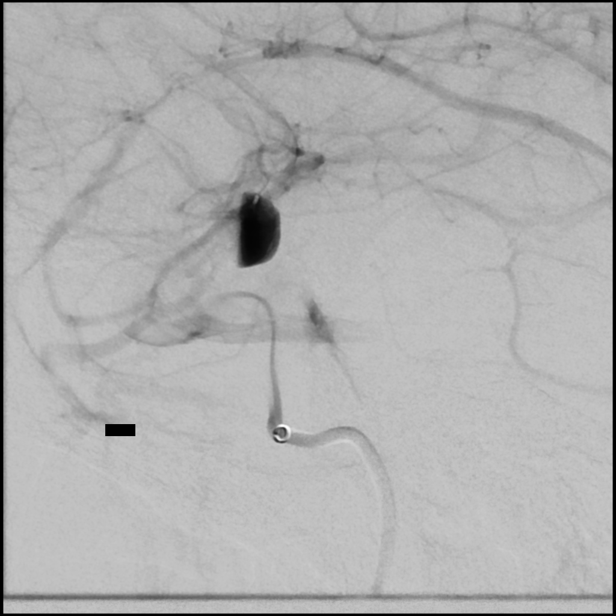
[im 248/262]
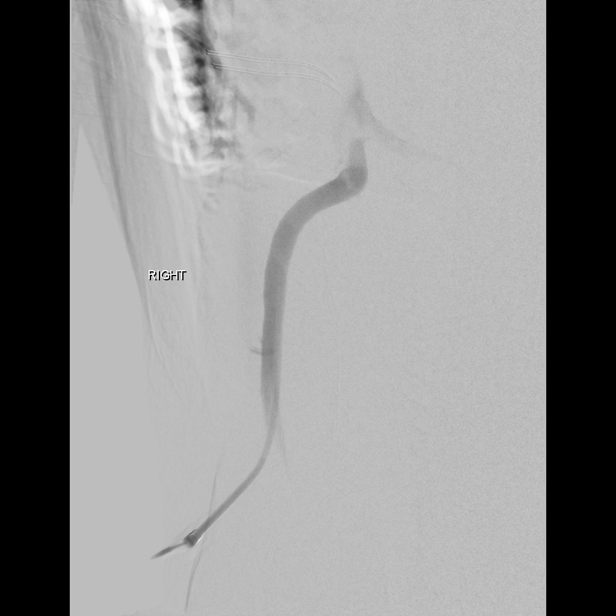

[9 of 24 positions shown; findings below may reference images not displayed]

MEDICATIONS:
Heparin 4,500 units IV; Ancef 2 g IV antibiotic was administered
within 1 hour of the procedure.

ANESTHESIA/SEDATION:
General anesthesia.

CONTRAST:  Isovue 300 approximately 100 mL.

FLUOROSCOPY TIME:  Fluoroscopy Time: 49 minutes 12 seconds ([7W]
mGy).

COMPLICATIONS:
None immediate.
The right groin was prepped and draped in the usual sterile fashion.
Thereafter using modified Seldinger technique, transfemoral access
into the right common femoral artery was obtained without
difficulty. Over a 0.035 inch guidewire, a 5 French Pinnacle sheath
was inserted. Through this, and also over 0.035 inch guidewire, a 5
French JB 1 catheter was advanced to the aortic arch region and
selectively positioned in the right common carotid artery.
FINDINGS: The right common carotid arteriogram demonstrates the right external
carotid artery and its major branches to be widely patent.

The right internal carotid artery at the bulb to the cranial skull
base also demonstrates wide patency. The petrous, the cavernous and
the supraclinoid segments are widely patent.

There is a mild focal fusiform dilatation of the distal cavernous
segment of the right internal carotid artery.

The right middle cerebral artery and the right anterior cerebral
artery opacify into the capillary and venous phases. Cross filling
via the anterior communicating artery of the left anterior cerebral
artery distally is seen.

The right middle cerebral artery bifurcation region again
demonstrates the presence of the large wide neck saccular aneurysm
projecting inferiorly and laterally.

A 3D rotational arteriogram was then perfor[REDACTED]ed over the
aneurysm. This again demonstrates the wide neck aneurysm with the
superior and inferior divisions arising just proximal at the neck of
the aneurysm.

The DSA measurements of the aneurysm were approximately 7.63 mm x
7.26 vs 6.47 mm.

ENDOVASCULAR TREATMENT OF THE RIGHT MIDDLE CEREBRAL ARTERY WIDE NECK
RIGHT MIDDLE CEREBRAL ARTERY ANEURYSM

The diagnostic JB 1 catheter in the right common carotid artery was
then exchanged over a 0.035 inch 300 cm Rosen exchange guidewire for
an 8 French short Pinnacle sheath in the right groin which was then
connected to continuous heparinized saline infusion. Over the Rosen
exchange guidewire, a 90 cm 8 French Neuron Max sheath was advanced
initially in the right common carotid bifurcation. The guidewire was
removed. Good aspiration obtained from the hub of the Neuron Max
sheath. Using biplane roadmap technique and constant fluoroscopic
guidance, over a 0.035 inch Roadrunner guidewire, a 6 French 115 cm
MARTILIO guide catheter was then advanced to the proximal cavernous
segment of the right internal carotid artery. The guidewire was
removed. Good aspiration obtained from the hub of the 6 MARTILIO
guide catheter. A control arteriogram performed at this site
demonstrated no change in the extracranial or intracranial
circulation of the right cerebral hemisphere.

An 027 Via microcatheter was then advanced over a 0.014 inch
standard Synchro micro guidewire with a J configuration to avoid
dissections and inducing spasm to the supraclinoid right ICA.

The 6 MARTILIO guide catheter was advanced into the proximal
cavernous segment of the right internal carotid artery.

The micro guidewire was then gently advanced using a torque device,
into the middle of the saccular aneurysm followed by the
microcatheter. The wire was then gently retrieved under constant
fluoroscopic guidance ensuring no movement of the microcatheter tip.
Free aspiration was obtained from the hub of the microcatheter which
was connected to continuous heparinized saline infusion.

Initially, a 9 mm x 7 mm WEB device was chosen. This was prepped and
purged of air immersed in a saline bath. This was then loaded onto
the microcatheter using biplane roadmap technique and consult
fluoroscopic guidance. Slow advancement of the device was performed
to where the distal marker on the microcatheter was then lined with
the distal marker on the device.

With slight forward traction with the right hand on the delivery
micro guidewire with the left hand the delivery microcatheter was
gently and slowly unsheathed until a flat configuration of the
device was formed. Thereafter, the combination of the microcatheter
and the device combination was advanced gently until the device was
seen to form within the entirety of the aneurysm.

However, on the lateral projection it was felt the device was
slightly over sized.

The device was then recaptured into the microcatheter with the
microcatheter in the middle half of the aneurysm and retrieved and
removed. A smaller device measuring 8 mm x 6 mm was again prepped
and purged with heparinized saline infusion immersed in water.
Thereafter, it was recaptured in the delivery microcatheter, was
advanced in a coaxial manner and with constant heparinized saline
infusion to the distal end of the intra-aneurysmal microcatheter.

The entire system was then straightened to relieve forward inertia.

Thereafter, with the tip of the microcatheter in the mid aneurysm,
and slight forward gentle traction with right hand on the delivery
micro guidewire, with the left hand the microcatheter was retrieved
gently unsheathing the device until 70% of the device had been
unsheathed. With a flat configuration now obtained, the combination
of the microcatheter and the device was slightly gently advanced
forward until complete with the aneurysm had been achieved by the
device, and stable configuration of the device was ascertained on
the native floor images.

Slight traction was applied to encourage the flat configuration
formation at the neck of the wide neck aneurysm.

Once achieved, the microcatheter was gently retrieved until the gap
was achieved between the proximal markers on the device, and also
the distal microcatheter. The separation of the microcatheter from
the device was witnessed on fluoroscopy. The microcatheter and the
delivery micro guidewire were then gently retrieved and removed. A
control arteriogram was then performed immediately at 15 and 35
minutes post deployment. These continued to demonstrate excellent
stasis within the entirety of the aneurysm with no change in the
configuration device itself.

The runs continued to demonstrate no evidence of intraluminal
filling defects within the superior and inferior divisions.

The patient's ACT was maintained in the region of approximately 200
seconds.

The Dyna CT the brain was undertaken in the usual manner with hand
injection of 1-2% concentration of contrast. Reconstructed images
demonstrated the stable configuration of the device, with no
impingement or coverage of the origins of the superior or the
inferior divisions.

A final control arteriogram performed through the 6 MARTILIO
guide catheter in the right internal carotid artery continued to
demonstrate complete stasis of contrast within the web device into
the delayed venous phases. No evidence of abnormal filling defects
were seen in superior or the inferior division of the right middle
cerebral artery, or of the anterior cerebral artery.

The 6 MARTILIO guide catheter, and the Neuron Max sheath were
retrieved and removed. The were right groin Pinnacle sheath was
removed with the successful application of an 8 French Angio-Seal
closure device. Distal pulses remained palpable in the dorsalis
pedis, and the posterior tibial regions bilaterally.

Patient was then extubated. Upon recovery, the patient denied any
headaches, nausea, vomiting, visual or motor symptoms. No pronation
drift of his outstretched hands.

Patient was then transferred to the neuro ICU to continue on
low-dose IV heparin, and close neurologic monitoring and control of
blood pressure.

Overnight the patient's stay was unremarkable.

The patient was also evaluated by cardiology as per previous plan.

The following day, the patient remained neurologically stable and
intact. The right groin appeared soft. Patient was able to tolerate
oral feeds without difficulty.

The patient was then ambulated and assessed prior to discharge home
following cardiology clearance with special instructions to continue
on baby aspirin indefinitely, and to take 45 mg of Brilinta for 2
weeks. The patient will be followed in the clinic in approximately 2
weeks post discharge.

He was asked to maintain adequate hydration. He was also encouraged
to steadily increase his level of activity as tolerated given his
cardiac problems.
IMPRESSION: Status post endovascular embolization of a right middle cerebral
artery aneurysm with the intra saccular WEB device.

PLAN:
Follow-up in the clinic 2 weeks post discharge.

## 2019-11-22 SURGERY — RADIOLOGY WITH ANESTHESIA
Anesthesia: General

## 2019-11-22 MED ORDER — AMIODARONE HCL 200 MG PO TABS
200.0000 mg | ORAL_TABLET | Freq: Two times a day (BID) | ORAL | Status: DC
  Administered 2019-11-22 – 2019-11-23 (×2): 200 mg via ORAL
  Filled 2019-11-22 (×2): qty 1

## 2019-11-22 MED ORDER — PHENYLEPHRINE HCL-NACL 10-0.9 MG/250ML-% IV SOLN
INTRAVENOUS | Status: DC | PRN
  Administered 2019-11-22: 50 ug/min via INTRAVENOUS

## 2019-11-22 MED ORDER — SODIUM CHLORIDE 0.9 % IV SOLN: INTRAVENOUS | Status: DC

## 2019-11-22 MED ORDER — ONDANSETRON HCL 4 MG/2ML IJ SOLN: 4.0000 mg | Freq: Once | INTRAMUSCULAR | Status: DC | PRN

## 2019-11-22 MED ORDER — ACETAMINOPHEN 650 MG RE SUPP: 650.0000 mg | RECTAL | Status: DC | PRN

## 2019-11-22 MED ORDER — LACTATED RINGERS IV SOLN: INTRAVENOUS | Status: DC | PRN

## 2019-11-22 MED ORDER — FENTANYL CITRATE (PF) 100 MCG/2ML IJ SOLN
INTRAMUSCULAR | Status: AC
  Filled 2019-11-22: qty 2

## 2019-11-22 MED ORDER — HEPARIN SODIUM (PORCINE) 1000 UNIT/ML IJ SOLN
INTRAMUSCULAR | Status: AC
  Filled 2019-11-22: qty 1

## 2019-11-22 MED ORDER — FENTANYL CITRATE (PF) 100 MCG/2ML IJ SOLN: 25.0000 ug | INTRAMUSCULAR | Status: DC | PRN

## 2019-11-22 MED ORDER — ONDANSETRON HCL 4 MG/2ML IJ SOLN
INTRAMUSCULAR | Status: DC | PRN
  Administered 2019-11-22: 4 mg via INTRAVENOUS

## 2019-11-22 MED ORDER — CEFAZOLIN SODIUM-DEXTROSE 2-4 GM/100ML-% IV SOLN
2.0000 g | INTRAVENOUS | Status: AC
  Administered 2019-11-22: 2 g via INTRAVENOUS
  Filled 2019-11-22: qty 100

## 2019-11-22 MED ORDER — ASPIRIN 81 MG PO CHEW
81.0000 mg | CHEWABLE_TABLET | Freq: Every day | ORAL | Status: DC
  Administered 2019-11-23: 81 mg via ORAL
  Filled 2019-11-22: qty 1

## 2019-11-22 MED ORDER — HEPARIN SODIUM (PORCINE) 1000 UNIT/ML IJ SOLN
INTRAMUSCULAR | Status: DC | PRN
  Administered 2019-11-22: 500 [IU] via INTRAVENOUS
  Administered 2019-11-22: 3000 [IU] via INTRAVENOUS
  Administered 2019-11-22: 1000 [IU] via INTRAVENOUS

## 2019-11-22 MED ORDER — CLEVIDIPINE BUTYRATE 0.5 MG/ML IV EMUL: 0.0000 mg/h | INTRAVENOUS | Status: AC

## 2019-11-22 MED ORDER — NITROGLYCERIN 1 MG/10 ML FOR IR/CATH LAB
INTRA_ARTERIAL | Status: AC
  Filled 2019-11-22: qty 10

## 2019-11-22 MED ORDER — DEXAMETHASONE SODIUM PHOSPHATE 10 MG/ML IJ SOLN
INTRAMUSCULAR | Status: DC | PRN
  Administered 2019-11-22: 10 mg via INTRAVENOUS

## 2019-11-22 MED ORDER — ACETAMINOPHEN 10 MG/ML IV SOLN: 1000.0000 mg | Freq: Once | INTRAVENOUS | Status: DC | PRN

## 2019-11-22 MED ORDER — FENTANYL CITRATE (PF) 250 MCG/5ML IJ SOLN
INTRAMUSCULAR | Status: DC | PRN
  Administered 2019-11-22: 100 ug via INTRAVENOUS

## 2019-11-22 MED ORDER — VERAPAMIL HCL 2.5 MG/ML IV SOLN
INTRAVENOUS | Status: AC
  Filled 2019-11-22: qty 2

## 2019-11-22 MED ORDER — TICAGRELOR 60 MG PO TABS
30.0000 mg | ORAL_TABLET | Freq: Two times a day (BID) | ORAL | Status: DC
  Filled 2019-11-22 (×3): qty 1

## 2019-11-22 MED ORDER — ATROPINE SULFATE 1 MG/10ML IJ SOSY
PREFILLED_SYRINGE | INTRAMUSCULAR | Status: AC
  Filled 2019-11-22: qty 10

## 2019-11-22 MED ORDER — OXYCODONE-ACETAMINOPHEN 5-325 MG PO TABS
1.0000 | ORAL_TABLET | ORAL | Status: DC | PRN
  Administered 2019-11-22: 1 via ORAL
  Filled 2019-11-22: qty 1

## 2019-11-22 MED ORDER — SUGAMMADEX SODIUM 200 MG/2ML IV SOLN
INTRAVENOUS | Status: DC | PRN
  Administered 2019-11-22: 200 mg via INTRAVENOUS

## 2019-11-22 MED ORDER — TEMAZEPAM 15 MG PO CAPS
30.0000 mg | ORAL_CAPSULE | Freq: Every evening | ORAL | Status: DC | PRN
  Administered 2019-11-22: 30 mg via ORAL
  Filled 2019-11-22: qty 2

## 2019-11-22 MED ORDER — HEPARIN (PORCINE) 25000 UT/250ML-% IV SOLN: 900.0000 [IU]/h | INTRAVENOUS | Status: DC

## 2019-11-22 MED ORDER — ASPIRIN EC 325 MG PO TBEC
325.0000 mg | DELAYED_RELEASE_TABLET | ORAL | Status: DC
  Filled 2019-11-22: qty 1

## 2019-11-22 MED ORDER — IOHEXOL 300 MG/ML  SOLN
150.0000 mL | Freq: Once | INTRAMUSCULAR | Status: AC | PRN
  Administered 2019-11-22: 90 mL via INTRA_ARTERIAL

## 2019-11-22 MED ORDER — ACETAMINOPHEN 160 MG/5ML PO SOLN: 650.0000 mg | ORAL | Status: DC | PRN

## 2019-11-22 MED ORDER — MIDAZOLAM HCL 5 MG/5ML IJ SOLN
INTRAMUSCULAR | Status: DC | PRN
  Administered 2019-11-22: 2 mg via INTRAVENOUS

## 2019-11-22 MED ORDER — ACETAMINOPHEN 325 MG PO TABS: 650.0000 mg | ORAL_TABLET | ORAL | Status: DC | PRN

## 2019-11-22 MED ORDER — CHLORHEXIDINE GLUCONATE CLOTH 2 % EX PADS
6.0000 | MEDICATED_PAD | Freq: Every day | CUTANEOUS | Status: DC
  Administered 2019-11-23: 6 via TOPICAL

## 2019-11-22 MED ORDER — HEPARIN (PORCINE) 25000 UT/250ML-% IV SOLN
INTRAVENOUS | Status: AC
  Filled 2019-11-22: qty 250

## 2019-11-22 MED ORDER — HEPARIN (PORCINE) 25000 UT/250ML-% IV SOLN: 900.0000 [IU]/h | INTRAVENOUS | Status: AC

## 2019-11-22 MED ORDER — NIMODIPINE 30 MG PO CAPS: 0.0000 mg | ORAL_CAPSULE | ORAL | Status: DC

## 2019-11-22 MED ORDER — HEPARIN (PORCINE) 25000 UT/250ML-% IV SOLN
500.0000 [IU]/h | INTRAVENOUS | Status: DC
  Administered 2019-11-22 (×2): 500 [IU]/h via INTRAVENOUS

## 2019-11-22 MED ORDER — OXYCODONE-ACETAMINOPHEN 10-325 MG PO TABS: 1.0000 | ORAL_TABLET | ORAL | Status: DC | PRN

## 2019-11-22 MED ORDER — ETOMIDATE 2 MG/ML IV SOLN
INTRAVENOUS | Status: DC | PRN
  Administered 2019-11-22: 20 mg via INTRAVENOUS

## 2019-11-22 MED ORDER — TICAGRELOR 60 MG PO TABS
30.0000 mg | ORAL_TABLET | Freq: Two times a day (BID) | ORAL | Status: DC
  Administered 2019-11-22 – 2019-11-23 (×2): 30 mg via ORAL
  Filled 2019-11-22 (×3): qty 1

## 2019-11-22 MED ORDER — ROCURONIUM BROMIDE 50 MG/5ML IV SOSY
PREFILLED_SYRINGE | INTRAVENOUS | Status: DC | PRN
  Administered 2019-11-22: 20 mg via INTRAVENOUS
  Administered 2019-11-22: 60 mg via INTRAVENOUS

## 2019-11-22 MED ORDER — ASPIRIN 81 MG PO CHEW: 81.0000 mg | CHEWABLE_TABLET | Freq: Every day | ORAL | Status: DC

## 2019-11-22 MED ORDER — IOHEXOL 300 MG/ML  SOLN
100.0000 mL | Freq: Once | INTRAMUSCULAR | Status: AC | PRN
  Administered 2019-11-22: 25 mL via INTRA_ARTERIAL

## 2019-11-22 MED ORDER — LIDOCAINE 2% (20 MG/ML) 5 ML SYRINGE
INTRAMUSCULAR | Status: DC | PRN
  Administered 2019-11-22: 100 mg via INTRAVENOUS

## 2019-11-22 MED ORDER — OXYCODONE HCL 5 MG/5ML PO SOLN: 5.0000 mg | Freq: Once | ORAL | Status: DC | PRN

## 2019-11-22 MED ORDER — LIDOCAINE HCL 1 % IJ SOLN
INTRAMUSCULAR | Status: AC
  Filled 2019-11-22: qty 20

## 2019-11-22 MED ORDER — OXYCODONE HCL 5 MG PO TABS
5.0000 mg | ORAL_TABLET | ORAL | Status: DC | PRN
  Administered 2019-11-22: 5 mg via ORAL
  Filled 2019-11-22: qty 1

## 2019-11-22 MED ORDER — OXYCODONE HCL 5 MG PO TABS: 5.0000 mg | ORAL_TABLET | Freq: Once | ORAL | Status: DC | PRN

## 2019-11-22 MED ORDER — PHENYLEPHRINE 40 MCG/ML (10ML) SYRINGE FOR IV PUSH (FOR BLOOD PRESSURE SUPPORT)
PREFILLED_SYRINGE | INTRAVENOUS | Status: DC | PRN
  Administered 2019-11-22 (×2): 40 ug via INTRAVENOUS
  Administered 2019-11-22: 80 ug via INTRAVENOUS

## 2019-11-22 NOTE — Progress Notes (Signed)
ANTICOAGULATION CONSULT NOTE - Initial Consult  Pharmacy Consult for Heparin  Indication: Post IR   No Known Allergies  Patient Measurements: Height: 6\' 1"  (185.4 cm) Weight: 102.1 kg (225 lb) IBW/kg (Calculated) : 79.9 Heparin Dosing Weight: 100 kg   Vital Signs: Temp: 98.4 F (36.9 C) (04/08 1317) Temp Source: Oral (04/08 1317) BP: 107/86 (04/08 1500) Pulse Rate: 186 (04/08 1500)  Labs: Recent Labs    11/22/19 0616  HGB 15.4  HCT 49.2  PLT 288  LABPROT 13.7  INR 1.1  CREATININE 1.04    Estimated Creatinine Clearance: 93.7 mL/min (by C-G formula based on SCr of 1.04 mg/dL).   Medical History: Past Medical History:  Diagnosis Date  . CHF (congestive heart failure) (HCC)   . Gout   . New onset atrial fibrillation (HCC)   . Pre-diabetes    diet controlled, no meds    Assessment: 64 yo male admitted for embolization of right MCA bifurcation aneursym. Pharmacy consulted to dose heparin for post IR protocol. Patient started on heparin 500 units/hr. Heparin dosing weight is ~100kg. No reported bleeding.   Goal of Therapy:  Heparin level 0.1 - 0.25 units/mL Monitor platelets by anticoagulation protocol: Yes   Plan:  Increase heparin to 900 units/hr based on heparin dosing weight  Check heparin level at 2230  Follow up plans to stop heparin at 0700 tomorrow morning per IR protocol   77, PharmD PGY1 Pharmacy Resident [employer]: [phone]   11/22/2019,4:02 PM

## 2019-11-22 NOTE — Procedures (Signed)
S/P RT common carotid arteriogram followed by embolization of RT MCA bifurcation aneurysm using a 40mm x 7 nmm WEB device. Patient extubated. Pupils 27mm RT = LT denies any H/As,N/V or visual symptoms. No facial asymmetry.  Moves all 4s equally. No pronation drift. Distal pulses DP and PTs palpable. S.Taiwo Fish MD

## 2019-11-22 NOTE — Progress Notes (Signed)
ANTICOAGULATION CONSULT NOTE  Pharmacy Consult for Heparin  Indication: Post IR   No Known Allergies  Patient Measurements: Height: 6\' 1"  (185.4 cm) Weight: 102.1 kg (225 lb) IBW/kg (Calculated) : 79.9 Heparin Dosing Weight: 100 kg   Vital Signs: Temp: 98 F (36.7 C) (04/08 2000) Temp Source: Oral (04/08 2000) BP: 103/77 (04/08 2100) Pulse Rate: 132 (04/08 2100)  Labs: Recent Labs    11/22/19 0616 11/22/19 2244  HGB 15.4  --   HCT 49.2  --   PLT 288  --   LABPROT 13.7  --   INR 1.1  --   HEPARINUNFRC  --  0.15*  CREATININE 1.04  --     Estimated Creatinine Clearance: 93.7 mL/min (by C-G formula based on SCr of 1.04 mg/dL).   Assessment: 64 yo male admitted for embolization of right MCA bifurcation aneurysm. Pharmacy consulted to dose heparin for post IR protocol. Patient started on heparin 500 units/hr. Heparin dosing weight is ~100kg. No reported bleeding.   Heparin level 0.15 (therapeutic) on gtt at 900 units/hr. No bleeding noted.  Goal of Therapy:  Heparin level 0.1 - 0.25 units/mL Monitor platelets by anticoagulation protocol: Yes   Plan:  Continue heparin at 900 units/hr  Follow up plans to stop heparin at 0700 tomorrow morning per IR protocol   79, PharmD, BCPS Please see amion for complete clinical pharmacist phone list 11/22/2019,11:30 PM

## 2019-11-22 NOTE — Anesthesia Procedure Notes (Signed)
Procedure Name: Intubation Date/Time: 11/22/2019 9:20 AM Performed by: Griffin Dakin, CRNA Pre-anesthesia Checklist: Patient identified, Emergency Drugs available, Suction available and Patient being monitored Patient Re-evaluated:Patient Re-evaluated prior to induction Oxygen Delivery Method: Circle system utilized Preoxygenation: Pre-oxygenation with 100% oxygen Induction Type: IV induction Ventilation: Mask ventilation without difficulty and Oral airway inserted - appropriate to patient size Laryngoscope Size: Mac and 4 Grade View: Grade II Tube type: Oral Tube size: 7.5 mm Number of attempts: 1 Airway Equipment and Method: Stylet and Oral airway Placement Confirmation: ETT inserted through vocal cords under direct vision,  positive ETCO2 and breath sounds checked- equal and bilateral Secured at: 24 cm Tube secured with: Tape Dental Injury: Teeth and Oropharynx as per pre-operative assessment

## 2019-11-22 NOTE — Consult Note (Addendum)
Cardiology Consultation:   Patient ID: Richard Graham MRN: [id number]; DOB: 1955-11-25  Admit date: 11/22/2019 Date of Consult: 11/22/2019  Primary Care Provider: Patient, No Pcp Per Primary Cardiologist: Fransico Him, MD  Primary Electrophysiologist:  None    Patient Profile:   Richard Graham is a 64 y.o. male with a hx of atrial fibrillation, systolic heart failure, cerebral aneurysm, CAD (CTO of LAD) and prediabetes who is being seen today for the evaluation of A. fib/anticoagulation at the request of Dr. [name].  History of Present Illness:   Richard Graham 64 year old male with past medical history noted above.  He was seen by cardiology for the first time back on March 22 by Dr. Radford Pax.  Presented with significant weight gain, lower extremity edema, orthopnea and dyspnea on exertion.  He was found to be in new onset atrial fibrillation with RVR, and new systolic heart failure with an EF of 20 to 25%.  Underwent cardiac catheterization with chronically occluded mid LAD with left to left collaterals.  Plan was for medical therapy but at some point could consider viability evaluation with possible CTO intervention.  An incidental finding of a cerebral aneurysm was found during this admission.  Stroke was ruled out, IR was consulted and Dr. [name] planned to perform angiography with possible embolization within a week.  Therefore he was not placed on oral anticoagulation.  Per Dr. Levonne Lapping given the patient's P2 Y 12 was 7 during admission it was recommended that he be discharged on Brilinta 30 mg twice daily along with aspirin 81 mg daily.  In regards to his Lasix dosing he was sent home on 80 mg daily with potassium supplement.  He presented today and underwent right common carotid arteriogram followed by embolization of right MCA bifurcation aneurysm with Dr. [name].  No complications noted during or post procedure.  He is currently on IV heparin.  He has been continued on  81 mg aspirin, and Brilinta 30 mg twice daily.  Past Medical History:  Diagnosis Date  . CHF (congestive heart failure) ([place])   . Gout   . New onset atrial fibrillation ([place])   . Pre-diabetes    diet controlled, no meds    Past Surgical History:  Procedure Laterality Date  . COLONOSCOPY    . INSERTION OF MESH N/A 06/27/2015   Procedure: INSERTION OF MESH;  Surgeon: Ralene Ok, MD;  Location: [facility];  Service: General;  Laterality: N/A;  . IR 3D INDEPENDENT WKST  11/14/2019  . IR ANGIO INTRA EXTRACRAN SEL COM CAROTID INNOMINATE BILAT MOD SED  11/14/2019  . IR ANGIO VERTEBRAL SEL VERTEBRAL BILAT MOD SED  11/14/2019  . laceration to fingers     left hand  . MOUTH SURGERY     secondary to abscess   . RIGHT/LEFT HEART CATH AND CORONARY ANGIOGRAPHY N/A 11/12/2019   Procedure: RIGHT/LEFT HEART CATH AND CORONARY ANGIOGRAPHY;  Surgeon: Jettie Booze, MD;  Location: [place] [facility];  Service: Cardiovascular;  Laterality: N/A;  . trauma to lip      sutured  . VENTRAL HERNIA REPAIR N/A 06/27/2015   Procedure: LAPAROSCOPIC VENTRAL HERNIA;  Surgeon: Ralene Ok, MD;  Location: [facility];  Service: General;  Laterality: N/A;     Home Medications:  Prior to Admission medications   Medication Sig Start Date End Date Taking? Authorizing Provider  amiodarone (PACERONE) 200 MG tablet Take 1 tablet (200 mg total) by mouth 2 (two) times daily for 14 days. 11/16/19 11/30/19 Yes  Danford, Earl Lites, MD  aspirin (ASPIRIN EC) 81 MG EC tablet Take 81 mg by mouth daily.   Yes [provider]  diclofenac sodium (VOLTAREN) 1 % GEL Apply 2 g topically 2 (two) times daily as needed (to affected sites- for arthritis pain).    Yes [provider]  furosemide (LASIX) 40 MG tablet Take 2 tablets (80 mg total) by mouth daily. Patient taking differently: Take 40 mg by mouth 2 (two) times daily.  11/16/19  Yes Danford, Earl Lites, MD  metoprolol tartrate (LOPRESSOR) 50 MG tablet Take 1  tablet (50 mg total) by mouth 2 (two) times daily. 11/16/19  Yes Danford, Earl Lites, MD  Omega-3 Fatty Acids (FISH OIL) 1200 MG CAPS Take 1,200 mg by mouth daily.    Yes [provider]  OVER THE COUNTER MEDICATION Take 1 tablet by mouth daily. Ultimate Omega   Yes [provider]  oxyCODONE-acetaminophen (PERCOCET) 10-325 MG tablet Take 1 tablet by mouth every 4 (four) hours as needed for pain. 06/27/15  Yes Axel Filler, MD  potassium chloride SA (KLOR-CON) 20 MEQ tablet Take 1 tablet (20 mEq total) by mouth daily. 11/16/19  Yes Danford, Earl Lites, MD  rosuvastatin (CRESTOR) 5 MG tablet Take 1 tablet (5 mg total) by mouth daily. 11/17/19  Yes Danford, Earl Lites, MD  temazepam (RESTORIL) 30 MG capsule Take 1 capsule (30 mg total) by mouth at bedtime as needed for sleep. Patient taking differently: Take 30 mg by mouth at bedtime.  11/16/19  Yes Danford, Earl Lites, MD  ticagrelor (BRILINTA) 60 MG TABS tablet Take 0.5 tablets (30 mg total) by mouth 2 (two) times daily. 11/16/19  Yes Danford, Earl Lites, MD  colchicine 0.6 MG tablet Take 0.6 mg by mouth 2 (two) times daily as needed (for gout flares).     [provider]    Inpatient Medications: Scheduled Meds: . [START ON 11/23/2019] aspirin  81 mg Oral Daily   Or  . [START ON 11/23/2019] aspirin  81 mg Per Tube Daily  . fentaNYL      . nitroGLYCERIN      . ticagrelor  30 mg Oral BID   Or  . ticagrelor  30 mg Per Tube BID   Continuous Infusions: . sodium chloride 75 mL/hr at 11/22/19 1400  . clevidipine    . heparin 500 Units/hr (11/22/19 1400)   PRN Meds: acetaminophen **OR** acetaminophen (TYLENOL) oral liquid 160 mg/5 mL **OR** acetaminophen  Allergies:   No Known Allergies  Social History:   Social History   Socioeconomic History  . Marital status: Married    Spouse name: Not on file  . Number of children: Not on file  . Years of education: Not on file  . Highest education level: Not on  file  Occupational History  . Not on file  Tobacco Use  . Smoking status: Former Smoker    Packs/day: 0.25    Years: 3.00    Pack years: 0.75    Types: Cigarettes    Quit date: 08/17/2011    Years since quitting: 8.2  . Smokeless tobacco: Never Used  Substance and Sexual Activity  . Alcohol use: Yes    Comment: occasional wine  . Drug use: No  . Sexual activity: Yes  Other Topics Concern  . Not on file  Social History Narrative  . Not on file   Social Determinants of Health   Financial Resource Strain:   . Difficulty of Paying Living Expenses:  Food Insecurity:   . Worried About Programme researcher, broadcasting/film/videounning Out of Food in the Last Year:   . Baristaan Out of Food in the Last Year:   Transportation Needs:   . Freight forwarderLack of Transportation (Medical):   Marland Kitchen. Lack of Transportation (Non-Medical):   Physical Activity:   . Days of Exercise per Week:   . Minutes of Exercise per Session:   Stress:   . Feeling of Stress :   Social Connections:   . Frequency of Communication with Friends and Family:   . Frequency of Social Gatherings with Friends and Family:   . Attends Religious Services:   . Active Member of Clubs or Organizations:   . Attends BankerClub or Organization Meetings:   Marland Kitchen. Marital Status:   Intimate Partner Violence:   . Fear of Current or Ex-Partner:   . Emotionally Abused:   Marland Kitchen. Physically Abused:   . Sexually Abused:     Family History:   History reviewed. No pertinent family history.   ROS:  Please see the history of present illness.   All other ROS reviewed and negative.     Physical Exam/Data:   Vitals:   11/22/19 1315 11/22/19 1317 11/22/19 1400 11/22/19 1430  BP: 109/83  99/79 101/90  Pulse: (!) 182 (!) 186 88 93  Resp: 16 (!) 22 18 15   Temp:  98.4 F (36.9 C)    TempSrc:  Oral    SpO2: 92% 95% 94% 96%  Weight:      Height:        Intake/Output Summary (Last 24 hours) at 11/22/2019 1454 Last data filed at 11/22/2019 1400 Gross per 24 hour  Intake 1053.72 ml  Output --  Net  1053.72 ml   Last 3 Weights 11/22/2019 11/21/2019 11/16/2019  Weight (lbs) 225 lb 225 lb 223 lb  Weight (kg) 102.059 kg 102.059 kg 101.152 kg     Body mass index is 29.69 kg/m.  General:  Well nourished, well developed, in no acute distress HEENT: normal Lymph: no adenopathy Neck: no JVD Endocrine:  No thryomegaly Vascular: No carotid bruits; FA pulses 2+ bilaterally without bruits  Cardiac:  normal S1, S2; irregularly irregular; no murmur  Lungs:  clear to auscultation bilaterally, no wheezing, rhonchi or rales  Abd: soft, nontender, no hepatomegaly  Ext: no edema Musculoskeletal:  No deformities, BUE and BLE strength normal and equal Skin: warm and dry  Neuro:  CNs 2-12 intact, no focal abnormalities noted Psych:  Normal affect   EKG:  The EKG was personally reviewed and demonstrates:  N/a  Telemetry:  Telemetry was personally reviewed and demonstrates: Atrial fibrillation, rates in the 90s  Relevant CV Studies:  Cath: 11/12/19   Mid LAD lesion is 100% stenosed. Chronically occluded with left to left collaterals.  LV end diastolic pressure is mildly elevated.  There is severe left ventricular systolic dysfunction.  The left ventricular ejection fraction is less than 25% by visual estimate.  There is no aortic valve stenosis.  Hemodynamic findings consistent with mild pulmonary hypertension.   Consider viability study of anterior wall.  If viable, would plan for CTO PCI of LAD.  If no chance of recovery, then medical therapy without revascularization.   Diagnostic Dominance: Left  Echo: 11/06/19  IMPRESSIONS    1. LV function is severely reduced, 20-25%. The mid anteroseptum into  apex is akinetic, almost aneurysmal, consistent with prior LAD infarction  (Q waves on EKG). The myocardium is thinned, futher supporting prior  infarction. On contrast imaging, there  is no LV thrombus seen.  2. Left ventricular ejection fraction, by estimation, is 20 to 25%. The    left ventricle has severely decreased function. The left ventricle  demonstrates regional wall motion abnormalities (see scoring  diagram/findings for description). There is mild  concentric left ventricular hypertrophy. Left ventricular diastolic  function could not be evaluated.  3. Right ventricular systolic function is mildly reduced. The right  ventricular size is normal. There is mildly elevated pulmonary artery  systolic pressure. The estimated right ventricular systolic pressure is  42.2 mmHg.  4. Left atrial size was mildly dilated.  5. The mitral valve is grossly normal. Trivial mitral valve  regurgitation. No evidence of mitral stenosis.  6. The aortic valve is tricuspid. Aortic valve regurgitation is not  visualized. No aortic stenosis is present.   Laboratory Data:  High Sensitivity Troponin:   Recent Labs  Lab 11/05/19 1731 11/05/19 1822 11/05/19 2053  TROPONINIHS 11 14 12      Chemistry Recent Labs  Lab 11/16/19 0537 11/22/19 0616  NA 137 137  K 4.4 4.4  CL 95* 95*  CO2 31 27  GLUCOSE 105* 113*  BUN 20 23  CREATININE 0.97 1.04  CALCIUM 9.3 9.6  GFRNONAA >60 >60  GFRAA >60 >60  ANIONGAP 11 15    No results for input(s): PROT, ALBUMIN, AST, ALT, ALKPHOS, BILITOT in the last 168 hours. Hematology Recent Labs  Lab 11/16/19 0537 11/22/19 0616  WBC 7.9 7.5  RBC 5.26 5.21  HGB 15.7 15.4  HCT 50.0 49.2  MCV 95.1 94.4  MCH 29.8 29.6  MCHC 31.4 31.3  RDW 14.0 14.2  PLT 214 288   BNPNo results for input(s): BNP, PROBNP in the last 168 hours.  DDimer No results for input(s): DDIMER in the last 168 hours.   Radiology/Studies:  No results found.   Assessment and Plan:   Richard Graham is a 64 y.o. male with a hx of atrial fibrillation, systolic heart failure, cerebral aneurysm, CAD (CTO of LAD) and prediabetes who is being seen today for the evaluation of A. fib/anticoagulation at the request of Dr. 77.  1.  Atrial fibrillation:  This was found during his recent hospitalization.  He was discharged home on amiodarone 200 mg twice daily.  Decision was made to hold DOAC at that time as he would require invasive therapy for his cerebral aneurysm.  He in atrial fibrillation on telemetry with rates reasonably controlled. --On IV heparin per neuro --continue amiodarone 200 mg twice daily --we will eventually need to be on DOAC.  In regards to his antiplatelet therapy from a neurological standpoint we will need to determine how long aspirin and Brilinta need to be continued.  Eventual plan for cardioversion.   2.  Systolic heart failure: EF noted to be 20-25% during prior admission.  He does not have any signs of volume overload currently.  Hold home Lasix dose for now.  Plan to resume Lopressor 50 mg twice daily tomorrow as long as blood pressure remained stable.  3.  Cerebral aneurysm: Status post embolization using web device.  Management per IR  4.  CAD: Underwent cardiac cath during last hospitalization showing CTO of the LAD.  Plan will be to eventually undergo viability study to determine whether CTO intervention is reasonable. --On aspirin and statin   For questions or updates, please contact [facility] Please consult [email or web address] for contact info under     Signed, Corliss Skains, NP  11/22/2019 2:54 PM  Patient  seen and examined with Laverda Page, NP.  Agree as above, with the following exceptions and changes as noted below.  Visited Richard Graham after his IR procedure-right MCA bifurcation aneurysm status post embolization using WEB device earlier today.  He is in good spirits, laughing and joking, no complaints of palpitations, chest pain, or shortness of breath.  He is rate controlled in atrial fibrillation currently. Gen: NAD, CV: iRRR, no murmurs, Lungs: clear, Abd: soft, Extrem: Warm, well perfused, no edema, Neuro/Psych: alert and oriented x 3, normal mood and affect. All available labs, radiology  testing, previous records reviewed.  He appears well compensated from a heart failure standpoint, and is rate controlled in atrial fibrillation.  CHADSVASC is likely 2 for new HF and vascular disease. This makes him a candidate for anticoagulation for atrial fibrillation.  We will need to finalize a plan with interventional radiology regarding timing of DAPT and addition of DOAC.  Continue other cardiovascular medications as he is currently stable on his home regimen.  Parke Poisson, MD

## 2019-11-22 NOTE — Transfer of Care (Signed)
Immediate Anesthesia Transfer of Care Note  Patient: CAULDER WEHNER  Procedure(s) Performed: EMBOLIZATION (N/A )  Patient Location: PACU  Anesthesia Type:General  Level of Consciousness: awake, alert  and oriented  Airway & Oxygen Therapy: Patient Spontanous Breathing  Post-op Assessment: Report given to RN and Post -op Vital signs reviewed and stable  Post vital signs: Reviewed and stable  Last Vitals:  Vitals Value Taken Time  BP 101/82 11/22/19 1230  Temp    Pulse 82 11/22/19 1238  Resp 15 11/22/19 1238  SpO2 94 % 11/22/19 1238  Vitals shown include unvalidated device data.  Last Pain:  Vitals:   11/22/19 0703  TempSrc:   PainSc: 0-No pain         Complications: No apparent anesthesia complications

## 2019-11-22 NOTE — H&P (Addendum)
Chief Complaint: Patient was seen in consultation today for cerebral arteriogram with possible embolization of Right middle cerebral artery aneurysm at the request of Dr Princess Perna  Referring Physician(s): Dr Lovina Reach  Supervising Physician: Julieanne Cotton  Patient Status: [facility] - Out-pt  History of Present Illness: Richard Graham is a 64 y.o. male   Pt originally hospitalized for exertional SOB; swelling and orthopnea Noted new onset Afib Cardiology consult: Echo 25-30% EF Chronic LAD lesion; New Afib Now follows with Dr Lovina Reach  In work up did have MRA for encephalopathy-which noted incidental R MCA aneurysm Cerebral arteriogram 11/14/19:  IMPRESSION: Approximately 8 mm x 8.7 mm wide neck saccular aneurysm arising in the right MCA bifurcation region. PLAN: Findings reviewed with the patient and the patient's referring physician. Endovascular treatment options considered and agreed upon. Endovascular treatment will be scheduled as soon as possible.  Was consulted with Dr Corliss Skains and now is scheduled for Right middle cerebral artery aneurysm embolization Taking Brilinta 30 mg BID and ASA 81 mg daily  P2y12 8 on 11/16/19-----recheck today    Past Medical History:  Diagnosis Date  . CHF (congestive heart failure) (HCC)   . Gout   . New onset atrial fibrillation (HCC)   . Pre-diabetes    diet controlled, no meds    Past Surgical History:  Procedure Laterality Date  . COLONOSCOPY    . INSERTION OF MESH N/A 06/27/2015   Procedure: INSERTION OF MESH;  Surgeon: Axel Filler, MD;  Location: [facility] ORS;  Service: General;  Laterality: N/A;  . IR 3D INDEPENDENT WKST  11/14/2019  . IR ANGIO INTRA EXTRACRAN SEL COM CAROTID INNOMINATE BILAT MOD SED  11/14/2019  . IR ANGIO VERTEBRAL SEL VERTEBRAL BILAT MOD SED  11/14/2019  . laceration to fingers     left hand  . MOUTH SURGERY     secondary to abscess   . RIGHT/LEFT HEART CATH AND CORONARY ANGIOGRAPHY N/A 11/12/2019    Procedure: RIGHT/LEFT HEART CATH AND CORONARY ANGIOGRAPHY;  Surgeon: Corky Crafts, MD;  Location: [facility] INVASIVE CV LAB;  Service: Cardiovascular;  Laterality: N/A;  . trauma to lip      sutured  . VENTRAL HERNIA REPAIR N/A 06/27/2015   Procedure: LAPAROSCOPIC VENTRAL HERNIA;  Surgeon: Axel Filler, MD;  Location: [facility] ORS;  Service: General;  Laterality: N/A;    Allergies: Patient has no known allergies.  Medications: Prior to Admission medications   Medication Sig Start Date End Date Taking? Authorizing Provider  amiodarone (PACERONE) 200 MG tablet Take 1 tablet (200 mg total) by mouth 2 (two) times daily for 14 days. 11/16/19 11/30/19 Yes Danford, Earl Lites, MD  aspirin (ASPIRIN EC) 81 MG EC tablet Take 81 mg by mouth daily.   Yes [provider]  colchicine 0.6 MG tablet Take 0.6 mg by mouth 2 (two) times daily as needed (for gout flares).    Yes [provider]  diclofenac sodium (VOLTAREN) 1 % GEL Apply 2 g topically 2 (two) times daily as needed (to affected sites- for arthritis pain).    Yes [provider]  furosemide (LASIX) 40 MG tablet Take 2 tablets (80 mg total) by mouth daily. Patient taking differently: Take 40 mg by mouth 2 (two) times daily.  11/16/19  Yes Danford, Earl Lites, MD  metoprolol tartrate (LOPRESSOR) 50 MG tablet Take 1 tablet (50 mg total) by mouth 2 (two) times daily. 11/16/19  Yes Danford, Earl Lites, MD  Omega-3 Fatty Acids (FISH OIL) 1200  MG CAPS Take 1,200 mg by mouth daily.    Yes [provider]  OVER THE COUNTER MEDICATION Take 1 tablet by mouth daily. Ultimate Omega   Yes [provider]  oxyCODONE-acetaminophen (PERCOCET) 10-325 MG tablet Take 1 tablet by mouth every 4 (four) hours as needed for pain. 06/27/15  Yes Axel Filleramirez, Armando, MD  potassium chloride SA (KLOR-CON) 20 MEQ tablet Take 1 tablet (20 mEq total) by mouth daily. 11/16/19  Yes Danford, Earl Liteshristopher P, MD  rosuvastatin (CRESTOR) 5 MG  tablet Take 1 tablet (5 mg total) by mouth daily. 11/17/19  Yes Danford, Earl Liteshristopher P, MD  temazepam (RESTORIL) 30 MG capsule Take 1 capsule (30 mg total) by mouth at bedtime as needed for sleep. Patient taking differently: Take 30 mg by mouth at bedtime.  11/16/19  Yes Danford, Earl Liteshristopher P, MD  ticagrelor (BRILINTA) 60 MG TABS tablet Take 0.5 tablets (30 mg total) by mouth 2 (two) times daily. 11/16/19  Yes Danford, Earl Liteshristopher P, MD     History reviewed. No pertinent family history.  Social History   Socioeconomic History  . Marital status: Married    Spouse name: Not on file  . Number of children: Not on file  . Years of education: Not on file  . Highest education level: Not on file  Occupational History  . Not on file  Tobacco Use  . Smoking status: Former Smoker    Packs/day: 0.25    Years: 3.00    Pack years: 0.75    Types: Cigarettes    Quit date: 08/17/2011    Years since quitting: 8.2  . Smokeless tobacco: Never Used  Substance and Sexual Activity  . Alcohol use: Yes    Comment: occasional wine  . Drug use: No  . Sexual activity: Yes  Other Topics Concern  . Not on file  Social History Narrative  . Not on file   Social Determinants of Health   Financial Resource Strain:   . Difficulty of Paying Living Expenses:   Food Insecurity:   . Worried About Programme researcher, broadcasting/film/videounning Out of Food in the Last Year:   . Baristaan Out of Food in the Last Year:   Transportation Needs:   . Freight forwarderLack of Transportation (Medical):   Marland Kitchen. Lack of Transportation (Non-Medical):   Physical Activity:   . Days of Exercise per Week:   . Minutes of Exercise per Session:   Stress:   . Feeling of Stress :   Social Connections:   . Frequency of Communication with Friends and Family:   . Frequency of Social Gatherings with Friends and Family:   . Attends Religious Services:   . Active Member of Clubs or Organizations:   . Attends BankerClub or Organization Meetings:   Marland Kitchen. Marital Status:     Review of Systems: A 12 point  ROS discussed and pertinent positives are indicated in the HPI above.  All other systems are negative.  Review of Systems  Constitutional: Negative for activity change, appetite change, fatigue, fever and unexpected weight change.  HENT: Negative for hearing loss, tinnitus and trouble swallowing.   Eyes: Negative for visual disturbance.  Respiratory: Negative for cough and shortness of breath.   Cardiovascular: Negative for chest pain.  Gastrointestinal: Negative for abdominal pain.  Musculoskeletal: Negative for back pain.  Neurological: Negative for dizziness, tremors, seizures, syncope, facial asymmetry, speech difficulty, weakness, light-headedness, numbness and headaches.  Psychiatric/Behavioral: Negative for behavioral problems and confusion.    Vital Signs: BP 101/73   Pulse 99  Temp 98 F (36.7 C) (Oral)   Resp 18   Ht  (1.854 m)   Wt 225 lb (102.1 kg)   SpO2 96%   BMI 29.69 kg/m   Physical Exam Vitals reviewed.  HENT:     Head: Atraumatic.     Mouth/Throat:     Mouth: Mucous membranes are moist.  Eyes:     Extraocular Movements: Extraocular movements intact.  Cardiovascular:     Rate and Rhythm: Normal rate and regular rhythm.     Heart sounds: Normal heart sounds.  Pulmonary:     Effort: Pulmonary effort is normal.     Breath sounds: Normal breath sounds.  Abdominal:     Palpations: Abdomen is soft.     Tenderness: There is no abdominal tenderness.  Musculoskeletal:        General: No swelling or tenderness. Normal range of motion.     Cervical back: Normal range of motion.     Right lower leg: No edema.     Left lower leg: No edema.  Skin:    General: Skin is warm and dry.  Neurological:     Mental Status: He is alert and oriented to person, place, and time.  Psychiatric:        Mood and Affect: Mood normal.        Behavior: Behavior normal.        Thought Content: Thought content normal.        Judgment: Judgment normal.     Imaging: CT  HEAD WO CONTRAST  Addendum Date: 11/06/2019   ADDENDUM REPORT: 11/06/2019 20:21 ADDENDUM: Study discussed by telephone with Provider M. Denny on 11/06/2019 at 2004 hours. Electronically Signed   By: Odessa Fleming M.D.   On: 11/06/2019 20:21   Result Date: 11/06/2019 CLINICAL DATA:  64 year old male with acute neurologic deficit. EXAM: CT HEAD WITHOUT CONTRAST TECHNIQUE: Contiguous axial images were obtained from the base of the skull through the vertex without intravenous contrast. COMPARISON:  None. FINDINGS: Brain: Abnormal rounded area of hypodensity in the anterior right temporal lobe, although seems to be inseparable from the right MCA vessels in that region (coronal image 28). No associated temporal lobe edema. No significant mass effect. Cerebral volume is within normal limits for age. Normal gray-white matter differentiation elsewhere in the brain. No ventriculomegaly, midline shift or intracranial mass effect. No convincing acute intracranial hemorrhage. No cortically based acute infarct identified. Vascular: Mild Calcified atherosclerosis at the skull base. Suspected large, approximately 10 mm saccular aneurysm of the right MCA bifurcation corresponding to the right temporal lobe hyperdensity described above. No other No suspicious intracranial vascular hyperdensity. Skull: Negative. Sinuses/Orbits: Visualized paranasal sinuses and mastoids are clear. Other: Visualized orbits and scalp soft tissues are within normal limits. IMPRESSION: 1. Plain CT appearance suspicious for a Large Right MCA bifurcation aneurysm, estimated at 10 mm. No acute identified to suggest aneurysm rupture. And otherwise negative noncontrast CT appearance of the brain. 2. Considering symptoms of acute neurologic deficit, follow-up noncontrast Head MRI and Head MRA may be the best next imaging step. Electronically Signed: By: Odessa Fleming M.D. On: 11/06/2019 19:23   MR ANGIO HEAD WO CONTRAST  Result Date: 11/07/2019 CLINICAL DATA:   64 year old male with initial noncontrast head CT yesterday suggesting a large right MCA bifurcation aneurysm. Found to be in atrial fibrillation this admission with CHF. Improving encephalopathy, likely metabolic. EXAM: MRA HEAD WITHOUT CONTRAST TECHNIQUE: Angiographic images of the Circle of Willis were obtained using MRA technique  without intravenous contrast. COMPARISON:  Noncontrast head CT yesterday. FINDINGS: No significant intracranial mass effect.  No ventriculomegaly. Antegrade flow in the posterior circulation with codominant distal vertebral arteries. No distal vertebral stenosis. Patent PICA origins and vertebrobasilar junction. Patent, mildly tortuous basilar artery without stenosis. Patent SCA and PCA origins which are mildly ectatic. Posterior communicating arteries are diminutive or absent. Bilateral PCA branches are within normal limits. Antegrade flow in the anterior circulation, both ICA siphons. Mild to moderate siphon ectasia, no siphon stenosis. Ophthalmic artery origins are within normal limits, the right is mildly tortuous. Patent and mildly ectatic carotid termini, MCA and ACA origins. The proximal ACAs are also mildly ectatic. No anterior communicating artery aneurysm. Visible ACA branches are tortuous but otherwise negative. Left MCA M1 segment and bifurcation are patent without stenosis. Visible left MCA branches are within normal limits. Right MCA M1 segment is patent without stenosis. There is an 11 mm diameter saccular aneurysm directed inferiorly and slightly laterally from the caudal aspect of the right MCA bifurcation. This has a broad aneurysm neck as seen on series 352, image 9. But beyond the bifurcation the visible right MCA branches are within normal limits. IMPRESSION: 1. Confirmed relatively large 11 mm Saccular Aneurysm of the Right MCA bifurcation. Recommend Neuro-endovascular NIR or Neurosurgery consultation to evaluate the appropriateness of potential treatment. 2.  Superimposed generalized intracranial artery ectasia, but no arterial stenosis or branch occlusion identified. Electronically Signed   By: Odessa Fleming M.D.   On: 11/07/2019 16:13   MR BRAIN WO CONTRAST  Result Date: 11/07/2019 CLINICAL DATA:  64 year old male with initial noncontrast head CT yesterday suggesting a large right MCA bifurcation aneurysm. Found to be in atrial fibrillation this admission with CHF. Improving encephalopathy, likely metabolic. EXAM: MRI HEAD WITHOUT CONTRAST TECHNIQUE: Multiplanar, multiecho pulse sequences of the brain and surrounding structures were obtained without intravenous contrast. COMPARISON:  intracranial MRA today reported separately. Head CT yesterday. FINDINGS: Brain: No restricted diffusion to suggest acute infarction. No midline shift, mass effect, evidence of mass lesion, ventriculomegaly, extra-axial collection or acute intracranial hemorrhage. Cervicomedullary junction and pituitary are within normal limits. Wallace Cullens and white matter signal is within normal limits for age throughout the brain. No cortical encephalomalacia or chronic cerebral blood products identified. Vascular: Major intracranial vascular flow voids are preserved. Abnormal 11 mm saccular flow void at the right MCA bifurcation, see comparison MRA. Skull and upper cervical spine: Normal visible cervical spine. Visualized bone marrow signal is within normal limits. Sinuses/Orbits: Negative. Other: Mastoids are clear. Visible internal auditory structures appear normal. Scalp and face soft tissues appear negative. IMPRESSION: 1. Right MCA aneurysm, see MRA today reported separately. 2. No acute intracranial abnormality and otherwise normal for age noncontrast MRI appearance of the brain. Electronically Signed   By: Odessa Fleming M.D.   On: 11/07/2019 16:19   CARDIAC CATHETERIZATION  Result Date: 11/12/2019  Mid LAD lesion is 100% stenosed. Chronically occluded with left to left collaterals.  LV end diastolic  pressure is mildly elevated.  There is severe left ventricular systolic dysfunction.  The left ventricular ejection fraction is less than 25% by visual estimate.  There is no aortic valve stenosis.  Hemodynamic findings consistent with mild pulmonary hypertension.  Consider viability study of anterior wall.  If viable, would plan for CTO PCI of LAD.  If no chance of recovery, then medical therapy without revascularization. Explained to wife, Beth [phone]   IR 3D Independent Annabell Sabal  Result Date: 11/19/2019 CLINICAL DATA:  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 EXAM: BILATERAL COMMON CAROTID AND INNOMINATE ANGIOGRAPHY COMPARISON:  MRI MRA examination of November 07, 2019. MEDICATIONS: Heparin 1000 units IV. No antibiotic was administered within 1 hour of the procedure. ANESTHESIA/SEDATION: Versed 0.5 mg IV; Fentanyl 12.5 mcg IV Moderate Sedation Time:  48 minutes The patient was continuously monitored during the procedure by the interventional radiology nurse under my direct supervision. CONTRAST:  Isovue 300 approximately 90 mL. FLUOROSCOPY TIME:  Fluoroscopy Time: 11 minutes 30 seconds (1622 mGy). COMPLICATIONS: None immediate. TECHNIQUE: Informed written consent was obtained from the patient after a thorough discussion of the procedural risks, benefits and alternatives. All questions were addressed. Maximal Sterile Barrier Technique was utilized including caps, mask, sterile gowns, sterile gloves, sterile drape, hand hygiene and skin antiseptic. A timeout was performed prior to the initiation of the procedure. The right groin was prepped and draped in the usual sterile fashion. Thereafter using modified Seldinger technique, transfemoral access into the right common femoral artery was obtained without difficulty. Over a 0.035 inch guidewire, a 5 French Pinnacle sheath was inserted. Through this, and also over 0.035 inch guidewire, a 5 [place] JB 1 catheter was  advanced to the aortic arch region and selectively positioned in the right common carotid artery, the right vertebral artery, the left common carotid artery and the left vertebral artery. Also performed was a 3D rotational arteriogram of the right anterior circulation via the right common carotid artery injection with subsequent 3D reconstruction. FINDINGS: The innominate arteriogram demonstrates the right subclavian artery and the right common carotid artery to be widely patent. The right common carotid arteriogram demonstrates the right external carotid artery and its major branches to be widely patent. The right internal carotid artery at the bulb to the cranial skull base demonstrates wide patency. The right middle cerebral artery and the right anterior cerebral artery opacify into the capillary and venous phases. Mild atherosclerotic disease is seen in the caval cavernous segment of the right internal carotid artery. Arising in the right middle cerebral artery bifurcation region is a large saccular aneurysm with a wide neck projecting inferiorly and slightly laterally. A 3D rotational and imaging with subsequent reconstruction on a separate workstation confirms the presence of an approximately 8 mm x 8.7 mm saccular aneurysm with a wide neck. The superior and inferior divisions of the right middle cerebral artery arise at the neck of the aneurysm. Cross-filling via the anterior communicating artery of the left anterior cerebral A2 segment is noted. The origin the right vertebral artery is widely patent. The vessel is seen to opacify to the cranial skull base. Wide patency is seen of the right vertebrobasilar junction and the right posterior-inferior cerebellar artery. The opacified portion of the basilar artery, the posterior cerebral arteries, the superior cerebellar arteries and anterior-inferior cerebellar arteries is seen into the capillary and venous phases. Unopacified blood is seen in the proximal  basilar artery from the contralateral vertebral artery. The left common carotid arteriogram demonstrates the origin of the left external carotid artery and its major branches to be widely patent. The left internal carotid artery at the bulb to the cranial skull base is widely patent. The petrous, cavernous and the supraclinoid segments are widely patent. The left middle cerebral artery and the left anterior cerebral artery opacify into the capillary and venous phases. Cross-filling via the anterior communicating artery of the right anterior cerebral A2 segment is seen. The left vertebral artery origin is widely patent. The  vessel has a moderate tortuous course in its proximal aspect. More distally the vessel is seen to opacify to the left vertebrobasilar junction. Opacification of the left posterior-inferior cerebellar artery is seen. The opacified portion of the basilar artery, the posterior cerebral arteries, the superior cerebellar arteries is seen into the capillary and venous phases. Unopacified blood is seen in the basilar artery from the contralateral vertebral artery. IMPRESSION: Approximately 8 mm x 8.7 mm wide neck saccular aneurysm arising in the right MCA bifurcation region. PLAN: Findings reviewed with the patient and the patient's referring physician. Endovascular treatment options considered and agreed upon. Endovascular treatment will be scheduled as soon as possible. Electronically Signed   By: Luanne Bras M.D.   On: 11/16/2019 16:08   DG Chest Portable 1 View  Result Date: 11/05/2019 CLINICAL DATA:  64 year old male with increasing shortness of breath x2 weeks. Abdominal and lower extremity edema. Wheezing. Found to be in atrial fibrillation with RVR. EXAM: PORTABLE CHEST 1 VIEW COMPARISON:  CT Abdomen and Pelvis 04/08/2015. FINDINGS: Portable AP semi upright view at 1539 hours. Possible new cardiomegaly since 2016. Other mediastinal contours are within normal limits. Low lung volumes  with combined patchy and veiling opacity at the right lung base. Generalized pulmonary vascular congestion. No pneumothorax. No left lung base opacity. No acute osseous abnormality identified. IMPRESSION: 1. Low lung volumes with suspected pulmonary interstitial edema with right lung base atelectasis and/or small pleural effusion. 2. Possible cardiomegaly, which would be new since 2016. Electronically Signed   By: Genevie Ann M.D.   On: 11/05/2019 15:54   ECHOCARDIOGRAM COMPLETE  Result Date: 11/06/2019    ECHOCARDIOGRAM REPORT   Patient Name:   BETTIE SWAVELY Lawniczak Date of Exam: 11/06/2019 Medical Rec #:  [id number]         Height:       73.0 in Accession #:    [id number]        Weight:       247.0 lb Date of Birth:  07-27-1956         BSA:          2.354 m Patient Age:    64 years          BP:           113/90 mmHg Patient Gender: M                 HR:           107 bpm. Exam Location:  Inpatient Procedure: 2D Echo Indications:    congestive heart failure 428.0  History:        Patient has no prior history of Echocardiogram examinations.                 Arrythmias:Atrial Fibrillation; Signs/Symptoms:Shortness of                 Breath and lower extremity edema.  Sonographer:    Johny Chess Referring Phys: [id number] RINKA R PAHWANI IMPRESSIONS  1. LV function is severely reduced, 20-25%. The mid anteroseptum into apex is akinetic, almost aneurysmal, consistent with prior LAD infarction (Q waves on EKG). The myocardium is thinned, futher supporting prior infarction. On contrast imaging, there is no LV thrombus seen.  2. Left ventricular ejection fraction, by estimation, is 20 to 25%. The left ventricle has severely decreased function. The left ventricle demonstrates regional wall motion abnormalities (see scoring diagram/findings for description). There is mild concentric left ventricular hypertrophy. Left ventricular diastolic function could  not be evaluated.  3. Right ventricular systolic function is mildly  reduced. The right ventricular size is normal. There is mildly elevated pulmonary artery systolic pressure. The estimated right ventricular systolic pressure is 42.2 mmHg.  4. Left atrial size was mildly dilated.  5. The mitral valve is grossly normal. Trivial mitral valve regurgitation. No evidence of mitral stenosis.  6. The aortic valve is tricuspid. Aortic valve regurgitation is not visualized. No aortic stenosis is present. Conclusion(s)/Recommendation(s): Findings consistent with ischemic cardiomyopathy. FINDINGS  Left Ventricle: Left ventricular ejection fraction, by estimation, is 20 to 25%. The left ventricle has severely decreased function. The left ventricle demonstrates regional wall motion abnormalities. Definity contrast agent was given IV to delineate the left ventricular endocardial borders. The left ventricular internal cavity size was normal in size. There is mild concentric left ventricular hypertrophy. Left ventricular diastolic function could not be evaluated due to atrial fibrillation. Left ventricular diastolic function could not be evaluated.  LV Wall Scoring: The mid and distal anterior septum, apical anterior segment, and apex are akinetic. Right Ventricle: The right ventricular size is normal. No increase in right ventricular wall thickness. Right ventricular systolic function is mildly reduced. There is mildly elevated pulmonary artery systolic pressure. The tricuspid regurgitant velocity  is 2.61 m/s, and with an assumed right atrial pressure of 15 mmHg, the estimated right ventricular systolic pressure is 42.2 mmHg. Left Atrium: Left atrial size was mildly dilated. Right Atrium: Right atrial size was normal in size. Pericardium: There is no evidence of pericardial effusion. Mitral Valve: The mitral valve is grossly normal. Trivial mitral valve regurgitation. No evidence of mitral valve stenosis. Tricuspid Valve: The tricuspid valve is grossly normal. Tricuspid valve regurgitation is  mild . No evidence of tricuspid stenosis. Aortic Valve: The aortic valve is tricuspid. Aortic valve regurgitation is not visualized. No aortic stenosis is present. Pulmonic Valve: The pulmonic valve was grossly normal. Pulmonic valve regurgitation is not visualized. No evidence of pulmonic stenosis. Aorta: The aortic root is normal in size and structure. IAS/Shunts: The interatrial septum appears to be lipomatous. No atrial level shunt detected by color flow Doppler. Additional Comments: LV function is severely reduced, 20-25%. The mid anteroseptum into apex is akinetic, almost aneurysmal, consistent with prior LAD infarction (Q waves on EKG). The myocardium is thinned, futher supporting prior infarction. On contrast  imaging, there is no LV thrombus seen.  LEFT VENTRICLE PLAX 2D LVIDd:         5.80 cm LVIDs:         4.60 cm LV PW:         1.20 cm LV IVS:        1.20 cm LVOT diam:     2.30 cm LV SV:         61 LV SV Index:   26 LVOT Area:     4.15 cm  RIGHT VENTRICLE RV S prime:     11.60 cm/s TAPSE (M-mode): 1.4 cm LEFT ATRIUM           Index       RIGHT ATRIUM           Index LA diam:      5.30 cm 2.25 cm/m  RA Area:     17.10 cm LA Vol (A2C): 62.1 ml 26.39 ml/m RA Volume:   46.60 ml  19.80 ml/m LA Vol (A4C): 82.4 ml 35.01 ml/m  AORTIC VALVE LVOT Vmax:   86.20 cm/s LVOT Vmean:  57.800 cm/s LVOT VTI:  0.146 m  AORTA Ao Root diam: 3.40 cm TRICUSPID VALVE TR Peak grad:   27.2 mmHg TR Vmax:        261.00 cm/s  SHUNTS Systemic VTI:  0.15 m Systemic Diam: 2.30 cm Lennie Odor MD Electronically signed by Lennie Odor MD Signature Date/Time: 11/06/2019/4:33:04 PM    Final    IR ANGIO INTRA EXTRACRAN SEL COM CAROTID INNOMINATE BILAT MOD SED  Result Date: 11/19/2019 CLINICAL DATA:  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 EXAM: BILATERAL COMMON CAROTID AND INNOMINATE ANGIOGRAPHY COMPARISON:  MRI MRA examination of November 07, 2019. MEDICATIONS: Heparin 1000 units IV. No  antibiotic was administered within 1 hour of the procedure. ANESTHESIA/SEDATION: Versed 0.5 mg IV; Fentanyl 12.5 mcg IV Moderate Sedation Time:  48 minutes The patient was continuously monitored during the procedure by the interventional radiology nurse under my direct supervision. CONTRAST:  Isovue 300 approximately 90 mL. FLUOROSCOPY TIME:  Fluoroscopy Time: 11 minutes 30 seconds (1622 mGy). COMPLICATIONS: None immediate. TECHNIQUE: Informed written consent was obtained from the patient after a thorough discussion of the procedural risks, benefits and alternatives. All questions were addressed. Maximal Sterile Barrier Technique was utilized including caps, mask, sterile gowns, sterile gloves, sterile drape, hand hygiene and skin antiseptic. A timeout was performed prior to the initiation of the procedure. The right groin was prepped and draped in the usual sterile fashion. Thereafter using modified Seldinger technique, transfemoral access into the right common femoral artery was obtained without difficulty. Over a 0.035 inch guidewire, a 5 French Pinnacle sheath was inserted. Through this, and also over 0.035 inch guidewire, a 5 [place] JB 1 catheter was advanced to the aortic arch region and selectively positioned in the right common carotid artery, the right vertebral artery, the left common carotid artery and the left vertebral artery. Also performed was a 3D rotational arteriogram of the right anterior circulation via the right common carotid artery injection with subsequent 3D reconstruction. FINDINGS: The innominate arteriogram demonstrates the right subclavian artery and the right common carotid artery to be widely patent. The right common carotid arteriogram demonstrates the right external carotid artery and its major branches to be widely patent. The right internal carotid artery at the bulb to the cranial skull base demonstrates wide patency. The right middle cerebral artery and the right anterior  cerebral artery opacify into the capillary and venous phases. Mild atherosclerotic disease is seen in the caval cavernous segment of the right internal carotid artery. Arising in the right middle cerebral artery bifurcation region is a large saccular aneurysm with a wide neck projecting inferiorly and slightly laterally. A 3D rotational and imaging with subsequent reconstruction on a separate workstation confirms the presence of an approximately 8 mm x 8.7 mm saccular aneurysm with a wide neck. The superior and inferior divisions of the right middle cerebral artery arise at the neck of the aneurysm. Cross-filling via the anterior communicating artery of the left anterior cerebral A2 segment is noted. The origin the right vertebral artery is widely patent. The vessel is seen to opacify to the cranial skull base. Wide patency is seen of the right vertebrobasilar junction and the right posterior-inferior cerebellar artery. The opacified portion of the basilar artery, the posterior cerebral arteries, the superior cerebellar arteries and anterior-inferior cerebellar arteries is seen into the capillary and venous phases. Unopacified blood is seen in the proximal basilar artery from the contralateral vertebral artery. The left common carotid arteriogram demonstrates the origin of the left external  carotid artery and its major branches to be widely patent. The left internal carotid artery at the bulb to the cranial skull base is widely patent. The petrous, cavernous and the supraclinoid segments are widely patent. The left middle cerebral artery and the left anterior cerebral artery opacify into the capillary and venous phases. Cross-filling via the anterior communicating artery of the right anterior cerebral A2 segment is seen. The left vertebral artery origin is widely patent. The vessel has a moderate tortuous course in its proximal aspect. More distally the vessel is seen to opacify to the left vertebrobasilar junction.  Opacification of the left posterior-inferior cerebellar artery is seen. The opacified portion of the basilar artery, the posterior cerebral arteries, the superior cerebellar arteries is seen into the capillary and venous phases. Unopacified blood is seen in the basilar artery from the contralateral vertebral artery. IMPRESSION: Approximately 8 mm x 8.7 mm wide neck saccular aneurysm arising in the right MCA bifurcation region. PLAN: Findings reviewed with the patient and the patient's referring physician. Endovascular treatment options considered and agreed upon. Endovascular treatment will be scheduled as soon as possible. Electronically Signed   By: Julieanne Cotton M.D.   On: 11/16/2019 16:08    Labs:  CBC: Recent Labs    11/13/19 0453 11/14/19 0615 11/15/19 0513 11/16/19 0537  WBC 7.9 8.8 9.2 7.9  HGB 15.3 16.2 16.4 15.7  HCT 48.0 51.1 52.9* 50.0  PLT 177 183 203 214    COAGS: Recent Labs    11/13/19 1012  INR 1.1    BMP: Recent Labs    11/11/19 0742 11/12/19 1410 11/12/19 1413 11/13/19 0453 11/14/19 0615 11/16/19 0537  NA 132*   < > 133* 135 136 137  K 4.2   < > 4.3 4.4 3.8 4.4  CL 88*  --   --  90* 90* 95*  CO2 31  --   --  34* 32 31  GLUCOSE 100*  --   --  107* 119* 105*  BUN 14  --   --  28* 26* 20  CALCIUM 9.1  --   --  9.8 9.7 9.3  CREATININE 0.91  --   --  1.02 1.12 0.97  GFRNONAA >60  --   --  >60 >60 >60  GFRAA >60  --   --  >60 >60 >60   < > = values in this interval not displayed.    LIVER FUNCTION TESTS: Recent Labs    11/05/19 1721 11/05/19 1721 11/05/19 2053 11/06/19 0312 11/08/19 2306 11/10/19 0505  BILITOT 1.5*  --  1.6* 1.3*  --   --   AST 30  --  34 21  --   --   ALT 23  --  19 21  --   --   ALKPHOS 63  --  58 60  --   --   PROT 6.0*  --  5.6* 5.6*  --   --   ALBUMIN 3.8   < > 3.5 3.5 3.8 3.6   < > = values in this interval not displayed.    TUMOR MARKERS: No results for input(s): AFPTM, CEA, CA199, CHROMGRNA in the last 8760  hours.  Assessment and Plan:  Incidental finding of R MCA aneurysm- asymptomatic Scheduled now for cerebral arteriogram and possible right middle cerebral artery aneurysm embolization Risks and benefits of cerebral angiogram with intervention were discussed with the patient including, but not limited to bleeding, infection, vascular injury, contrast induced renal failure, stroke or  even death.  This interventional procedure involves the use of X-rays and because of the nature of the planned procedure, it is possible that we will have prolonged use of X-ray fluoroscopy.  Potential radiation risks to you include (but are not limited to) the following: - A slightly elevated risk for cancer  several years later in life. This risk is typically less than 0.5% percent. This risk is low in comparison to the normal incidence of human cancer, which is 33% for women and 50% for men according to the [employer]. - Radiation induced injury can include skin redness, resembling a rash, tissue breakdown / ulcers and hair loss (which can be temporary or permanent).   The likelihood of either of these occurring depends on the difficulty of the procedure and whether you are sensitive to radiation due to previous procedures, disease, or genetic conditions.   IF your procedure requires a prolonged use of radiation, you will be notified and given written instructions for further action.  It is your responsibility to monitor the irradiated area for the 2 weeks following the procedure and to notify your physician if you are concerned that you have suffered a radiation induced injury.    All of the patient's questions were answered, patient is agreeable to proceed.  Consent signed and in chart.  Pt is aware if intervention is performed-- he will be admitted to Neuro ICU overnight for observation- plan to DC in am  Thank you for this interesting consult.  I greatly enjoyed meeting Richard Graham and  look forward to participating in their care.  A copy of this report was sent to the requesting provider on this date.  Electronically Signed: Robet Leu, PA-C 11/22/2019, 6:39 AM   I spent a total of    25 Minutes in face to face in clinical consultation, greater than 50% of which was counseling/coordinating care for R MCA aneurysm embolization

## 2019-11-22 NOTE — TOC Initial Note (Addendum)
Transition of Care [facility]) - Initial/Assessment Note    Patient Details  Name: Richard Graham MRN: [id number] Date of Birth: 07/15/1956  [facility]) CM/SW Contact:    Epifanio Lesches, RN Phone Number: [phone] 11/22/2019, 2:56 PM  Clinical Narrative:                 Readmitted with aneurysm noted from recent admit incidentally.  - s/p  R common carotid arteriogram followed by embolization of R       MCA bifurcation aneurysm, 11/22/2019 Previously admitted 3/22-11/16/2019, CHF/ new a.fib, cerebral aneurysm. From home with wife. PTA independent with ADL's ,no DME usage.  [place] Festa (Spouse)     [phone]      [facility] team monitoring  and following for TOC needs ....   Expected Discharge Plan: Home/Self Care Barriers to Discharge: Continued Medical Work up   Patient Goals and CMS Choice        Expected Discharge Plan and Services Expected Discharge Plan: Home/Self Care        Prior Living Arrangements/Services   Activities of Daily Living Home Assistive Devices/Equipment: None ADL Screening (condition at time of admission) Patient's cognitive ability adequate to safely complete daily activities?: Yes Is the patient deaf or have difficulty hearing?: No Does the patient have difficulty seeing, even when wearing glasses/contacts?: No Does the patient have difficulty concentrating, remembering, or making decisions?: No Patient able to express need for assistance with ADLs?: Yes Does the patient have difficulty dressing or bathing?: No Independently performs ADLs?: Yes (appropriate for developmental age) Does the patient have difficulty walking or climbing stairs?: Yes Weakness of Legs: None Weakness of Arms/Hands: None  Permission Sought/Granted                  Emotional Assessment              Admission diagnosis:  Brain aneurysm [I67.1] Patient Active Problem List   Diagnosis Date Noted  . Brain aneurysm 11/22/2019  . Acute  respiratory failure with hypoxia (HCC)   . Hypervolemia   . SOB (shortness of breath)   . Acute CHF (congestive heart failure) (HCC) 11/05/2019  . Gout   . Thrombocytopenia (HCC)   . New onset a-fib (HCC)   . Morbid obesity (HCC)    PCP:  Patient, No Pcp Per Pharmacy:   [facility] [phone] [place], [place] - [phone] AT [facility] [address] [place] [address] Phone: [phone] Fax: [phone]     Social Determinants of Health (SDOH) Interventions    Readmission Risk Interventions No flowsheet data found.

## 2019-11-22 NOTE — Anesthesia Postprocedure Evaluation (Signed)
Anesthesia Post Note  Patient: Richard Graham  Procedure(s) Performed: EMBOLIZATION (N/A )     Patient location during evaluation: PACU Anesthesia Type: General Level of consciousness: awake and alert Pain management: pain level controlled Vital Signs Assessment: post-procedure vital signs reviewed and stable Respiratory status: spontaneous breathing, nonlabored ventilation, respiratory function stable and patient connected to nasal cannula oxygen Cardiovascular status: blood pressure returned to baseline and stable Postop Assessment: no apparent nausea or vomiting Anesthetic complications: no    Last Vitals:  Vitals:   11/22/19 1315 11/22/19 1317  BP: 109/83   Pulse: (!) 182 (!) 186  Resp: 16 (!) 22  Temp:  36.9 C  SpO2: 92% 95%    Last Pain:  Vitals:   11/22/19 1317  TempSrc: Oral  PainSc: 0-No pain                 Trevor Iha

## 2019-11-23 ENCOUNTER — Encounter: Payer: Self-pay | Admitting: *Deleted

## 2019-11-23 LAB — CBC WITH DIFFERENTIAL/PLATELET
Abs Immature Granulocytes: 0.04 10*3/uL (ref 0.00–0.07)
Basophils Absolute: 0 10*3/uL (ref 0.0–0.1)
Basophils Relative: 0 %
Eosinophils Absolute: 0 10*3/uL (ref 0.0–0.5)
Eosinophils Relative: 0 %
HCT: 44.9 % (ref 39.0–52.0)
Hemoglobin: 14.2 g/dL (ref 13.0–17.0)
Immature Granulocytes: 0 %
Lymphocytes Relative: 11 %
Lymphs Abs: 1.2 10*3/uL (ref 0.7–4.0)
MCH: 29.6 pg (ref 26.0–34.0)
MCHC: 31.6 g/dL (ref 30.0–36.0)
MCV: 93.5 fL (ref 80.0–100.0)
Monocytes Absolute: 0.6 10*3/uL (ref 0.1–1.0)
Monocytes Relative: 6 %
Neutro Abs: 8.7 10*3/uL — ABNORMAL HIGH (ref 1.7–7.7)
Neutrophils Relative %: 83 %
Platelets: 259 10*3/uL (ref 150–400)
RBC: 4.8 MIL/uL (ref 4.22–5.81)
RDW: 14 % (ref 11.5–15.5)
WBC: 10.6 10*3/uL — ABNORMAL HIGH (ref 4.0–10.5)
nRBC: 0 % (ref 0.0–0.2)

## 2019-11-23 LAB — BASIC METABOLIC PANEL
Anion gap: 11 (ref 5–15)
BUN: 15 mg/dL (ref 8–23)
CO2: 25 mmol/L (ref 22–32)
Calcium: 9.1 mg/dL (ref 8.9–10.3)
Chloride: 99 mmol/L (ref 98–111)
Creatinine, Ser: 0.81 mg/dL (ref 0.61–1.24)
GFR calc Af Amer: >60 mL/min (ref >60)
GFR calc non Af Amer: >60 mL/min (ref >60)
Glucose, Bld: 137 mg/dL — ABNORMAL HIGH (ref 70–99)
Potassium: 4.7 mmol/L (ref 3.5–5.1)
Sodium: 135 mmol/L (ref 135–145)

## 2019-11-23 MED ORDER — APIXABAN 5 MG PO TABS: 5.0000 mg | ORAL_TABLET | Freq: Two times a day (BID) | ORAL | 3 refills | Status: DC

## 2019-11-23 MED ORDER — TICAGRELOR 90 MG PO TABS: 90.0000 mg | ORAL_TABLET | Freq: Every day | ORAL | 0 refills | Status: DC

## 2019-11-23 MED FILL — ELIQUIS 5 MG TABLET: 5 | 30 days supply | Qty: 60 | Fill #0

## 2019-11-23 NOTE — Progress Notes (Signed)
Patient discharged home with wife in no apparent distress. Discharge instructions given and wife and patient verbalize understanding to changes in medication. Dicie Beam RN BSN

## 2019-11-23 NOTE — Discharge Summary (Signed)
Patient ID: Richard Graham Kolek MRN: [id number] DOB/AGE: Mar 15, 1956 64 y.o.  Admit date: 11/22/2019 Discharge date: 11/23/2019  Supervising Physician: Julieanne Cottoneveshwar, Sanjeev  Patient Status: [facility] - [facility]  Admission Diagnoses: Brain aneurysm  Discharge Diagnoses:  Active Problems:   Brain aneurysm   Discharged Condition: stable  Hospital Course:  [facility]/2019 for an image-guided cerebral arteriogram with embolization of right MCA bifurcation aneurysm using WEB device via right radial approach by Dr. Corliss Skainseveshwar. Procedure occurred without major complications and patient was transferred to neuro ICU in stable condition (VSS, right radial incision stable) for overnight observation. Cardiology was consulted on admission due to newly diagnosed atrial fibrillation and heart failure. No major events occurred overnight.  Patient awake and alert sitting in chair with no complaints at this time. Denies headache, weakness, numbness/tingling, dizziness, vision changes, hearing changes, tinnitus, or speech difficulty. Right radial incision stable. Cardiology recommends initiation of Eliquis due to atrial fibrillation, states patient is stable to discharge from cardiology standpoint. Plan to discharge home today and follow-up with Dr. Corliss Skainseveshwar in clinic 2 weeks after discharge.   Consults: cardiology  Significant Diagnostic Studies: CT HEAD WO CONTRAST  Addendum Date: 11/06/2019   ADDENDUM REPORT: 11/06/2019 20:21 ADDENDUM: Study discussed by telephone with Provider M. Denny on 11/06/2019 at 2004 hours. Electronically Signed   By: Odessa FlemingH  Hall M.D.   On: 11/06/2019 20:21   Result Date: 11/06/2019 CLINICAL DATA:  64 year old male with acute neurologic deficit. EXAM: CT HEAD WITHOUT CONTRAST TECHNIQUE: Contiguous axial images were obtained from the base of the skull through the vertex without intravenous contrast. COMPARISON:  None. FINDINGS: Brain: Abnormal rounded area of hypodensity in  the anterior right temporal lobe, although seems to be inseparable from the right MCA vessels in that region (coronal image 28). No associated temporal lobe edema. No significant mass effect. Cerebral volume is within normal limits for age. Normal gray-white matter differentiation elsewhere in the brain. No ventriculomegaly, midline shift or intracranial mass effect. No convincing acute intracranial hemorrhage. No cortically based acute infarct identified. Vascular: Mild Calcified atherosclerosis at the skull base. Suspected large, approximately 10 mm saccular aneurysm of the right MCA bifurcation corresponding to the right temporal lobe hyperdensity described above. No other No suspicious intracranial vascular hyperdensity. Skull: Negative. Sinuses/Orbits: Visualized paranasal sinuses and mastoids are clear. Other: Visualized orbits and scalp soft tissues are within normal limits. IMPRESSION: 1. Plain CT appearance suspicious for a Large Right MCA bifurcation aneurysm, estimated at 10 mm. No acute identified to suggest aneurysm rupture. And otherwise negative noncontrast CT appearance of the brain. 2. Considering symptoms of acute neurologic deficit, follow-up noncontrast Head MRI and Head MRA may be the best next imaging step. Electronically Signed: By: Odessa FlemingH  Hall M.D. On: 11/06/2019 19:23   MR ANGIO HEAD WO CONTRAST  Result Date: 11/07/2019 CLINICAL DATA:  64 year old male with initial noncontrast head CT yesterday suggesting a large right MCA bifurcation aneurysm. Found to be in atrial fibrillation this admission with CHF. Improving encephalopathy, likely metabolic. EXAM: MRA HEAD WITHOUT CONTRAST TECHNIQUE: Angiographic images of the Circle of Willis were obtained using MRA technique without intravenous contrast. COMPARISON:  Noncontrast head CT yesterday. FINDINGS: No significant intracranial mass effect.  No ventriculomegaly. Antegrade flow in the posterior circulation with codominant distal vertebral  arteries. No distal vertebral stenosis. Patent PICA origins and vertebrobasilar junction. Patent, mildly tortuous basilar artery without stenosis. Patent SCA and PCA origins which are mildly ectatic. Posterior communicating arteries are diminutive or absent. Bilateral PCA branches  are within normal limits. Antegrade flow in the anterior circulation, both ICA siphons. Mild to moderate siphon ectasia, no siphon stenosis. Ophthalmic artery origins are within normal limits, the right is mildly tortuous. Patent and mildly ectatic carotid termini, MCA and ACA origins. The proximal ACAs are also mildly ectatic. No anterior communicating artery aneurysm. Visible ACA branches are tortuous but otherwise negative. Left MCA M1 segment and bifurcation are patent without stenosis. Visible left MCA branches are within normal limits. Right MCA M1 segment is patent without stenosis. There is an 11 mm diameter saccular aneurysm directed inferiorly and slightly laterally from the caudal aspect of the right MCA bifurcation. This has a broad aneurysm neck as seen on series 352, image 9. But beyond the bifurcation the visible right MCA branches are within normal limits. IMPRESSION: 1. Confirmed relatively large 11 mm Saccular Aneurysm of the Right MCA bifurcation. Recommend Neuro-endovascular NIR or Neurosurgery consultation to evaluate the appropriateness of potential treatment. 2. Superimposed generalized intracranial artery ectasia, but no arterial stenosis or branch occlusion identified. Electronically Signed   By: Odessa Fleming M.D.   On: 11/07/2019 16:13   MR BRAIN WO CONTRAST  Result Date: 11/07/2019 CLINICAL DATA:  64 year old male with initial noncontrast head CT yesterday suggesting a large right MCA bifurcation aneurysm. Found to be in atrial fibrillation this admission with CHF. Improving encephalopathy, likely metabolic. EXAM: MRI HEAD WITHOUT CONTRAST TECHNIQUE: Multiplanar, multiecho pulse sequences of the brain and  surrounding structures were obtained without intravenous contrast. COMPARISON:  intracranial MRA today reported separately. Head CT yesterday. FINDINGS: Brain: No restricted diffusion to suggest acute infarction. No midline shift, mass effect, evidence of mass lesion, ventriculomegaly, extra-axial collection or acute intracranial hemorrhage. Cervicomedullary junction and pituitary are within normal limits. Wallace Cullens and white matter signal is within normal limits for age throughout the brain. No cortical encephalomalacia or chronic cerebral blood products identified. Vascular: Major intracranial vascular flow voids are preserved. Abnormal 11 mm saccular flow void at the right MCA bifurcation, see comparison MRA. Skull and upper cervical spine: Normal visible cervical spine. Visualized bone marrow signal is within normal limits. Sinuses/Orbits: Negative. Other: Mastoids are clear. Visible internal auditory structures appear normal. Scalp and face soft tissues appear negative. IMPRESSION: 1. Right MCA aneurysm, see MRA today reported separately. 2. No acute intracranial abnormality and otherwise normal for age noncontrast MRI appearance of the brain. Electronically Signed   By: Odessa Fleming M.D.   On: 11/07/2019 16:19   CARDIAC CATHETERIZATION  Result Date: 11/12/2019  Mid LAD lesion is 100% stenosed. Chronically occluded with left to left collaterals.  LV end diastolic pressure is mildly elevated.  There is severe left ventricular systolic dysfunction.  The left ventricular ejection fraction is less than 25% by visual estimate.  There is no aortic valve stenosis.  Hemodynamic findings consistent with mild pulmonary hypertension.  Consider viability study of anterior wall.  If viable, would plan for CTO PCI of LAD.  If no chance of recovery, then medical therapy without revascularization. Explained to wife, Waynetta Sandy [phone]   IR 3D Independent Annabell Sabal  Result Date: 11/19/2019 CLINICAL DATA:  Discovery of a right  middle cerebral artery aneurysm on workup for encephalopathy. EXAM: BILATERAL COMMON CAROTID AND INNOMINATE ANGIOGRAPHY COMPARISON:  MRI MRA examination of November 07, 2019. MEDICATIONS: Heparin 1000 units IV. No antibiotic was administered within 1 hour of the procedure. ANESTHESIA/SEDATION: Versed 0.5 mg IV; Fentanyl 12.5 mcg IV Moderate Sedation Time:  48 minutes The patient was continuously monitored during the  procedure by the interventional radiology nurse under my direct supervision. CONTRAST:  Isovue 300 approximately 90 mL. FLUOROSCOPY TIME:  Fluoroscopy Time: 11 minutes 30 seconds (1622 mGy). COMPLICATIONS: None immediate. TECHNIQUE: Informed written consent was obtained from the patient after a thorough discussion of the procedural risks, benefits and alternatives. All questions were addressed. Maximal Sterile Barrier Technique was utilized including caps, mask, sterile gowns, sterile gloves, sterile drape, hand hygiene and skin antiseptic. A timeout was performed prior to the initiation of the procedure. The right groin was prepped and draped in the usual sterile fashion. Thereafter using modified Seldinger technique, transfemoral access into the right common femoral artery was obtained without difficulty. Over a 0.035 inch guidewire, a 5 French Pinnacle sheath was inserted. Through this, and also over 0.035 inch guidewire, a 5 [place] JB 1 catheter was advanced to the aortic arch region and selectively positioned in the right common carotid artery, the right vertebral artery, the left common carotid artery and the left vertebral artery. Also performed was a 3D rotational arteriogram of the right anterior circulation via the right common carotid artery injection with subsequent 3D reconstruction. FINDINGS: The innominate arteriogram demonstrates the right subclavian artery and the right common carotid artery to be widely patent. The right common carotid arteriogram demonstrates the right external carotid  artery and its major branches to be widely patent. The right internal carotid artery at the bulb to the cranial skull base demonstrates wide patency. The right middle cerebral artery and the right anterior cerebral artery opacify into the capillary and venous phases. Mild atherosclerotic disease is seen in the caval cavernous segment of the right internal carotid artery. Arising in the right middle cerebral artery bifurcation region is a large saccular aneurysm with a wide neck projecting inferiorly and slightly laterally. A 3D rotational and imaging with subsequent reconstruction on a separate workstation confirms the presence of an approximately 8 mm x 8.7 mm saccular aneurysm with a wide neck. The superior and inferior divisions of the right middle cerebral artery arise at the neck of the aneurysm. Cross-filling via the anterior communicating artery of the left anterior cerebral A2 segment is noted. The origin the right vertebral artery is widely patent. The vessel is seen to opacify to the cranial skull base. Wide patency is seen of the right vertebrobasilar junction and the right posterior-inferior cerebellar artery. The opacified portion of the basilar artery, the posterior cerebral arteries, the superior cerebellar arteries and anterior-inferior cerebellar arteries is seen into the capillary and venous phases. Unopacified blood is seen in the proximal basilar artery from the contralateral vertebral artery. The left common carotid arteriogram demonstrates the origin of the left external carotid artery and its major branches to be widely patent. The left internal carotid artery at the bulb to the cranial skull base is widely patent. The petrous, cavernous and the supraclinoid segments are widely patent. The left middle cerebral artery and the left anterior cerebral artery opacify into the capillary and venous phases. Cross-filling via the anterior communicating artery of the right anterior cerebral A2 segment  is seen. The left vertebral artery origin is widely patent. The vessel has a moderate tortuous course in its proximal aspect. More distally the vessel is seen to opacify to the left vertebrobasilar junction. Opacification of the left posterior-inferior cerebellar artery is seen. The opacified portion of the basilar artery, the posterior cerebral arteries, the superior cerebellar arteries is seen into the capillary and venous phases. Unopacified blood is seen in the basilar artery from the  contralateral vertebral artery. IMPRESSION: Approximately 8 mm x 8.7 mm wide neck saccular aneurysm arising in the right MCA bifurcation region. PLAN: Findings reviewed with the patient and the patient's referring physician. Endovascular treatment options considered and agreed upon. Endovascular treatment will be scheduled as soon as possible. Electronically Signed   By: Julieanne Cotton M.D.   On: 11/16/2019 16:08   DG Chest Portable 1 View  Result Date: 11/05/2019 CLINICAL DATA:  64 year old male with increasing shortness of breath x2 weeks. Abdominal and lower extremity edema. Wheezing. Found to be in atrial fibrillation with RVR. EXAM: PORTABLE CHEST 1 VIEW COMPARISON:  CT Abdomen and Pelvis 04/08/2015. FINDINGS: Portable AP semi upright view at 1539 hours. Possible new cardiomegaly since 2016. Other mediastinal contours are within normal limits. Low lung volumes with combined patchy and veiling opacity at the right lung base. Generalized pulmonary vascular congestion. No pneumothorax. No left lung base opacity. No acute osseous abnormality identified. IMPRESSION: 1. Low lung volumes with suspected pulmonary interstitial edema with right lung base atelectasis and/or small pleural effusion. 2. Possible cardiomegaly, which would be new since 2016. Electronically Signed   By: Odessa Fleming M.D.   On: 11/05/2019 15:54   ECHOCARDIOGRAM COMPLETE  Result Date: 11/06/2019    ECHOCARDIOGRAM REPORT   Patient Name:   DAREON NUNZIATO Magallanes  Date of Exam: 11/06/2019 Medical Rec #:  [id number]         Height:       73.0 in Accession #:    [id number]        Weight:       247.0 lb Date of Birth:  February 27, 1956         BSA:          2.354 m Patient Age:    63 years          BP:           113/90 mmHg Patient Gender: M                 HR:           107 bpm. Exam Location:  Inpatient Procedure: 2D Echo Indications:    congestive heart failure 428.0  History:        Patient has no prior history of Echocardiogram examinations.                 Arrythmias:Atrial Fibrillation; Signs/Symptoms:Shortness of                 Breath and lower extremity edema.  Sonographer:    Delcie Roch Referring Phys: [id number] RINKA R PAHWANI IMPRESSIONS  1. LV function is severely reduced, 20-25%. The mid anteroseptum into apex is akinetic, almost aneurysmal, consistent with prior LAD infarction (Q waves on EKG). The myocardium is thinned, futher supporting prior infarction. On contrast imaging, there is no LV thrombus seen.  2. Left ventricular ejection fraction, by estimation, is 20 to 25%. The left ventricle has severely decreased function. The left ventricle demonstrates regional wall motion abnormalities (see scoring diagram/findings for description). There is mild concentric left ventricular hypertrophy. Left ventricular diastolic function could not be evaluated.  3. Right ventricular systolic function is mildly reduced. The right ventricular size is normal. There is mildly elevated pulmonary artery systolic pressure. The estimated right ventricular systolic pressure is 42.2 mmHg.  4. Left atrial size was mildly dilated.  5. The mitral valve is grossly normal. Trivial mitral valve regurgitation. No evidence of mitral stenosis.  6. The aortic valve  is tricuspid. Aortic valve regurgitation is not visualized. No aortic stenosis is present. Conclusion(s)/Recommendation(s): Findings consistent with ischemic cardiomyopathy. FINDINGS  Left Ventricle: Left ventricular ejection fraction,  by estimation, is 20 to 25%. The left ventricle has severely decreased function. The left ventricle demonstrates regional wall motion abnormalities. Definity contrast agent was given IV to delineate the left ventricular endocardial borders. The left ventricular internal cavity size was normal in size. There is mild concentric left ventricular hypertrophy. Left ventricular diastolic function could not be evaluated due to atrial fibrillation. Left ventricular diastolic function could not be evaluated.  LV Wall Scoring: The mid and distal anterior septum, apical anterior segment, and apex are akinetic. Right Ventricle: The right ventricular size is normal. No increase in right ventricular wall thickness. Right ventricular systolic function is mildly reduced. There is mildly elevated pulmonary artery systolic pressure. The tricuspid regurgitant velocity  is 2.61 m/s, and with an assumed right atrial pressure of 15 mmHg, the estimated right ventricular systolic pressure is 42.2 mmHg. Left Atrium: Left atrial size was mildly dilated. Right Atrium: Right atrial size was normal in size. Pericardium: There is no evidence of pericardial effusion. Mitral Valve: The mitral valve is grossly normal. Trivial mitral valve regurgitation. No evidence of mitral valve stenosis. Tricuspid Valve: The tricuspid valve is grossly normal. Tricuspid valve regurgitation is mild . No evidence of tricuspid stenosis. Aortic Valve: The aortic valve is tricuspid. Aortic valve regurgitation is not visualized. No aortic stenosis is present. Pulmonic Valve: The pulmonic valve was grossly normal. Pulmonic valve regurgitation is not visualized. No evidence of pulmonic stenosis. Aorta: The aortic root is normal in size and structure. IAS/Shunts: The interatrial septum appears to be lipomatous. No atrial level shunt detected by color flow Doppler. Additional Comments: LV function is severely reduced, 20-25%. The mid anteroseptum into apex is akinetic,  almost aneurysmal, consistent with prior LAD infarction (Q waves on EKG). The myocardium is thinned, futher supporting prior infarction. On contrast  imaging, there is no LV thrombus seen.  LEFT VENTRICLE PLAX 2D LVIDd:         5.80 cm LVIDs:         4.60 cm LV PW:         1.20 cm LV IVS:        1.20 cm LVOT diam:     2.30 cm LV SV:         61 LV SV Index:   26 LVOT Area:     4.15 cm  RIGHT VENTRICLE RV S prime:     11.60 cm/s TAPSE (M-mode): 1.4 cm LEFT ATRIUM           Index       RIGHT ATRIUM           Index LA diam:      5.30 cm 2.25 cm/m  RA Area:     17.10 cm LA Vol (A2C): 62.1 ml 26.39 ml/m RA Volume:   46.60 ml  19.80 ml/m LA Vol (A4C): 82.4 ml 35.01 ml/m  AORTIC VALVE LVOT Vmax:   86.20 cm/s LVOT Vmean:  57.800 cm/s LVOT VTI:    0.146 m  AORTA Ao Root diam: 3.40 cm TRICUSPID VALVE TR Peak grad:   27.2 mmHg TR Vmax:        261.00 cm/s  SHUNTS Systemic VTI:  0.15 m Systemic Diam: 2.30 cm Lennie Odor MD Electronically signed by Lennie Odor MD Signature Date/Time: 11/06/2019/4:33:04 PM    Final    IR ANGIO INTRA  EXTRACRAN SEL COM CAROTID INNOMINATE BILAT MOD SED  Result Date: 11/19/2019 CLINICAL DATA:  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 EXAM: BILATERAL COMMON CAROTID AND INNOMINATE ANGIOGRAPHY COMPARISON:  MRI MRA examination of November 07, 2019. MEDICATIONS: Heparin 1000 units IV. No antibiotic was administered within 1 hour of the procedure. ANESTHESIA/SEDATION: Versed 0.5 mg IV; Fentanyl 12.5 mcg IV Moderate Sedation Time:  48 minutes The patient was continuously monitored during the procedure by the interventional radiology nurse under my direct supervision. CONTRAST:  Isovue 300 approximately 90 mL. FLUOROSCOPY TIME:  Fluoroscopy Time: 11 minutes 30 seconds (1622 mGy). COMPLICATIONS: None immediate. TECHNIQUE: Informed written consent was obtained from the patient after a thorough discussion of the procedural risks, benefits and alternatives. All  questions were addressed. Maximal Sterile Barrier Technique was utilized including caps, mask, sterile gowns, sterile gloves, sterile drape, hand hygiene and skin antiseptic. A timeout was performed prior to the initiation of the procedure. The right groin was prepped and draped in the usual sterile fashion. Thereafter using modified Seldinger technique, transfemoral access into the right common femoral artery was obtained without difficulty. Over a 0.035 inch guidewire, a 5 French Pinnacle sheath was inserted. Through this, and also over 0.035 inch guidewire, a 5 [place] JB 1 catheter was advanced to the aortic arch region and selectively positioned in the right common carotid artery, the right vertebral artery, the left common carotid artery and the left vertebral artery. Also performed was a 3D rotational arteriogram of the right anterior circulation via the right common carotid artery injection with subsequent 3D reconstruction. FINDINGS: The innominate arteriogram demonstrates the right subclavian artery and the right common carotid artery to be widely patent. The right common carotid arteriogram demonstrates the right external carotid artery and its major branches to be widely patent. The right internal carotid artery at the bulb to the cranial skull base demonstrates wide patency. The right middle cerebral artery and the right anterior cerebral artery opacify into the capillary and venous phases. Mild atherosclerotic disease is seen in the caval cavernous segment of the right internal carotid artery. Arising in the right middle cerebral artery bifurcation region is a large saccular aneurysm with a wide neck projecting inferiorly and slightly laterally. A 3D rotational and imaging with subsequent reconstruction on a separate workstation confirms the presence of an approximately 8 mm x 8.7 mm saccular aneurysm with a wide neck. The superior and inferior divisions of the right middle cerebral artery arise at the  neck of the aneurysm. Cross-filling via the anterior communicating artery of the left anterior cerebral A2 segment is noted. The origin the right vertebral artery is widely patent. The vessel is seen to opacify to the cranial skull base. Wide patency is seen of the right vertebrobasilar junction and the right posterior-inferior cerebellar artery. The opacified portion of the basilar artery, the posterior cerebral arteries, the superior cerebellar arteries and anterior-inferior cerebellar arteries is seen into the capillary and venous phases. Unopacified blood is seen in the proximal basilar artery from the contralateral vertebral artery. The left common carotid arteriogram demonstrates the origin of the left external carotid artery and its major branches to be widely patent. The left internal carotid artery at the bulb to the cranial skull base is widely patent. The petrous, cavernous and the supraclinoid segments are widely patent. The left middle cerebral artery and the left anterior cerebral artery opacify into the capillary and venous phases. Cross-filling via the anterior communicating artery of the right  anterior cerebral A2 segment is seen. The left vertebral artery origin is widely patent. The vessel has a moderate tortuous course in its proximal aspect. More distally the vessel is seen to opacify to the left vertebrobasilar junction. Opacification of the left posterior-inferior cerebellar artery is seen. The opacified portion of the basilar artery, the posterior cerebral arteries, the superior cerebellar arteries is seen into the capillary and venous phases. Unopacified blood is seen in the basilar artery from the contralateral vertebral artery. IMPRESSION: Approximately 8 mm x 8.7 mm wide neck saccular aneurysm arising in the right MCA bifurcation region. PLAN: Findings reviewed with the patient and the patient's referring physician. Endovascular treatment options considered and agreed upon. Endovascular  treatment will be scheduled as soon as possible. Electronically Signed   By: Julieanne Cotton M.D.   On: 11/16/2019 16:08    Treatments: Endovascular embolization of right MCA bifurcation aneurysm using WEB device  Discharge Exam: Blood pressure 111/82, pulse (!) 135, temperature 97.7 F (36.5 C), temperature source Oral, resp. rate (!) 22, height  (1.854 m), weight 225 lb (102.1 kg), SpO2 94 %. Physical Exam Vitals and nursing note reviewed.  Constitutional:      General: He is not in acute distress.    Appearance: Normal appearance.  Cardiovascular:     Rate and Rhythm: Regular rhythm. Tachycardia present.     Heart sounds: Normal heart sounds. No murmur.  Pulmonary:     Effort: Pulmonary effort is normal. No respiratory distress.     Breath sounds: Normal breath sounds. No wheezing.  Skin:    General: Skin is warm and dry.     Comments: Right radial incision soft without active bleeding or hematoma.  Neurological:     Mental Status: He is alert.     Comments: Alert, awake, and oriented x3. Speech and comprehension intact. PERRL bilaterally. No facial asymmetry. Tongue midline. Can spontaneously move all extremities. No pronator drift. Fine motor and coordination intact and symmetric. Distal pulses 2+ bilaterally (DPs and PTs).      Disposition: Discharge disposition: 01-Home or Self Care       Discharge Instructions    Call MD for:  difficulty breathing, headache or visual disturbances   Complete by: As directed    Call MD for:  extreme fatigue   Complete by: As directed    Call MD for:  hives   Complete by: As directed    Call MD for:  persistant dizziness or light-headedness   Complete by: As directed    Call MD for:  persistant nausea and vomiting   Complete by: As directed    Call MD for:  redness, tenderness, or signs of infection (pain, swelling, redness, odor or green/yellow discharge around incision site)   Complete by: As directed    Call MD  for:  severe uncontrolled pain   Complete by: As directed    Call MD for:  temperature >100.4   Complete by: As directed    Diet - low sodium heart healthy   Complete by: As directed    Discharge instructions   Complete by: As directed    Begin taking Eliquis 5 mg twice daily. Take Brilinta 45 mg (1/2 tablet) by mouth once daily until 2 week follow-up. Discontinue Aspirin use at this time. No bending, stooping, or lifting more than 10 pounds for 2 weeks. No driving self for 2 weeks. Stay hydrated by drinking plenty of water. Ok to shower 48 hours post procedure. No submerging (bathing,  swimming) for 7 days post procedure.   Increase activity slowly   Complete by: As directed    Advance activity as tolerated.   Remove dressing in 24 hours   Complete by: As directed      Allergies as of 11/23/2019   No Known Allergies     Medication List    STOP taking these medications   aspirin EC 81 MG EC tablet Generic drug: aspirin     TAKE these medications   amiodarone 200 MG tablet Commonly known as: PACERONE Take 1 tablet (200 mg total) by mouth 2 (two) times daily for 14 days.   apixaban 5 MG Tabs tablet Commonly known as: ELIQUIS Take 1 tablet (5 mg total) by mouth 2 (two) times daily.   colchicine 0.6 MG tablet Take 0.6 mg by mouth 2 (two) times daily as needed (for gout flares).   diclofenac sodium 1 % Gel Commonly known as: VOLTAREN Apply 2 g topically 2 (two) times daily as needed (to affected sites- for arthritis pain).   Fish Oil 1200 MG Caps Take 1,200 mg by mouth daily.   furosemide 40 MG tablet Commonly known as: LASIX Take 2 tablets (80 mg total) by mouth daily. What changed:   how much to take  when to take this   metoprolol tartrate 50 MG tablet Commonly known as: LOPRESSOR Take 1 tablet (50 mg total) by mouth 2 (two) times daily.   OVER THE COUNTER MEDICATION Take 1 tablet by mouth daily. Ultimate Omega   oxyCODONE-acetaminophen 10-325 MG  tablet Commonly known as: Percocet Take 1 tablet by mouth every 4 (four) hours as needed for pain.   potassium chloride SA 20 MEQ tablet Commonly known as: KLOR-CON Take 1 tablet (20 mEq total) by mouth daily.   rosuvastatin 5 MG tablet Commonly known as: CRESTOR Take 1 tablet (5 mg total) by mouth daily.   temazepam 30 MG capsule Commonly known as: RESTORIL Take 1 capsule (30 mg total) by mouth at bedtime as needed for sleep. What changed: when to take this   ticagrelor 90 MG Tabs tablet Commonly known as: Brilinta Take 1 tablet (90 mg total) by mouth daily. Take 1/2 tablet by mouth once daily until 2 week follow-up appointment. What changed:   medication strength  how much to take  when to take this  additional instructions      Follow-up Information    Luanne Bras, MD Follow up in 2 week(s).   Specialties: Interventional Radiology, Radiology Why: Please plan to follow-up with Dr. [name] in clinic 2 weeks after discharge. Our office will call you to set up this appoitment. Contact information: [place] [address] [phone]            Electronically Signed: Earley Abide, PA-C 11/23/2019, 2:32 PM   I have spent Greater Than 30 Minutes discharging [place].

## 2019-11-23 NOTE — Progress Notes (Signed)
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 Patient awake and alert sitting in bed eating breakfast with no complaints at this time. Can spontaneously move all extremities. Right groin incision c/d/i.   Allergies: Patient has no known allergies.  Medications: Prior to Admission medications   Medication Sig Start Date End Date Taking? Authorizing Provider  amiodarone (PACERONE) 200 MG tablet Take 1 tablet (200 mg total) by mouth 2 (two) times daily for 14 days. 11/16/19 11/30/19 Yes Danford, Earl Lites, MD  aspirin (ASPIRIN EC) 81 MG EC tablet Take 81 mg by mouth daily.   Yes [provider]  diclofenac sodium (VOLTAREN) 1 % GEL Apply 2 g topically 2 (two) times daily as needed (to affected sites- for arthritis pain).    Yes [provider]  furosemide (LASIX) 40 MG tablet Take 2 tablets (80 mg total) by mouth daily. Patient taking differently: Take 40 mg by mouth 2 (two) times daily.  11/16/19  Yes Danford, Earl Lites, MD  metoprolol tartrate (LOPRESSOR) 50 MG tablet Take 1 tablet (50 mg total) by mouth 2 (two) times daily. 11/16/19  Yes Danford, Earl Lites, MD  Omega-3 Fatty Acids (FISH OIL) 1200 MG CAPS Take 1,200 mg by mouth daily.    Yes [provider]  OVER THE COUNTER MEDICATION Take 1 tablet by mouth daily. Ultimate Omega   Yes [provider]  oxyCODONE-acetaminophen (PERCOCET) 10-325 MG tablet Take 1 tablet by mouth every 4 (four) hours as needed for pain. 06/27/15  Yes Axel Filler, MD  potassium chloride SA (KLOR-CON) 20 MEQ tablet Take 1 tablet (20 mEq total) by mouth daily. 11/16/19  Yes Danford, Earl Lites, MD  rosuvastatin (CRESTOR) 5 MG tablet Take 1 tablet (5 mg total) by mouth daily. 11/17/19  Yes Danford, Earl Lites, MD  temazepam  (RESTORIL) 30 MG capsule Take 1 capsule (30 mg total) by mouth at bedtime as needed for sleep. Patient taking differently: Take 30 mg by mouth at bedtime.  11/16/19  Yes Danford, Earl Lites, MD  ticagrelor (BRILINTA) 60 MG TABS tablet Take 0.5 tablets (30 mg total) by mouth 2 (two) times daily. 11/16/19  Yes Danford, Earl Lites, MD  colchicine 0.6 MG tablet Take 0.6 mg by mouth 2 (two) times daily as needed (for gout flares).     [provider]     Vital Signs: BP 101/70   Pulse (!) 205   Temp 98 F (36.7 C) (Oral)   Resp 14   Ht 6\' 1"  (1.854 m)   Wt 225 lb (102.1 kg)   SpO2 (!) 89%   BMI 29.69 kg/m   Physical Exam Vitals and nursing note reviewed.  Constitutional:      General: He is not in acute distress.    Appearance: Normal appearance.  Cardiovascular:     Rate and Rhythm: Regular rhythm. Tachycardia present.     Heart sounds: Normal heart sounds. No murmur.  Pulmonary:     Effort: Pulmonary effort is normal. No respiratory distress.     Breath sounds: Normal breath sounds. No wheezing.  Skin:    General: Skin is warm and dry.     Comments: Right groin incision soft without active bleeding or hematoma.  Neurological:     Mental Status: He is alert.     Comments: Alert, awake, and oriented x3.  Speech and comprehension intact. PERRL bilaterally. No facial asymmetry. Tongue midline. Can spontaneously move all extremities. No pronator drift. Fine motor and coordination intact and symmetric. Distal pulses 2+ bilaterally (DPs and PTs).  Psychiatric:        Mood and Affect: Mood normal.        Behavior: Behavior normal.     Imaging: No results found.  Labs:  CBC: Recent Labs    11/14/19 0615 11/15/19 0513 11/16/19 0537 11/22/19 0616  WBC 8.8 9.2 7.9 7.5  HGB 16.2 16.4 15.7 15.4  HCT 51.1 52.9* 50.0 49.2  PLT 183 203 214 288    COAGS: Recent Labs    11/13/19 1012 11/22/19 0616  INR 1.1 1.1    BMP: Recent Labs    11/13/19 0453  11/14/19 0615 11/16/19 0537 11/22/19 0616  NA 135 136 137 137  K 4.4 3.8 4.4 4.4  CL 90* 90* 95* 95*  CO2 34* 32 31 27  GLUCOSE 107* 119* 105* 113*  BUN 28* 26* 20 23  CALCIUM 9.8 9.7 9.3 9.6  CREATININE 1.02 1.12 0.97 1.04  GFRNONAA >60 >60 >60 >60  GFRAA >60 >60 >60 >60    LIVER FUNCTION TESTS: Recent Labs    11/05/19 1721 11/05/19 1721 11/05/19 2053 11/06/19 0312 11/08/19 2306 11/10/19 0505  BILITOT 1.5*  --  1.6* 1.3*  --   --   AST 30  --  34 21  --   --   ALT 23  --  19 21  --   --   ALKPHOS 63  --  58 60  --   --   PROT 6.0*  --  5.6* 5.6*  --   --   ALBUMIN 3.8   < > 3.5 3.5 3.8 3.6   < > = values in this interval not displayed.    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 Patient's condition stable- can spontaneously move all extremities, no focal neurologic deficits noted. Right groin incision stable, distal pulses 2+ bilaterally (DPs and PTs). From NIR standpoint, patient stable for discharge. Will await cardiology input on this- if stable from cardiac standpoint will discharge patient today. Upon discharge- patient to begin taking Brilinta 45 mg once daily and Aspirin 81 mg once daily x 2 weeks, then discontinue Brilinta use and proceed with Aspirin 81 mg once daily. Patient to follow-up with Dr. [name] in clinic 2 weeks after discharge, our office to call to set up this appointment. NIR to follow.   Electronically Signed: Earley Abide, PA-C 11/23/2019, 9:21 AM   I spent a total of 25 Minutes at the the patient's bedside AND on the patient's hospital floor or unit, greater than 50% of which was counseling/coordinating care for right MCA bifurcation aneurysm s/p embolization.

## 2019-11-23 NOTE — Discharge Instructions (Signed)
Endovascular Therapy for Cerebral Aneurysm, Care After This sheet gives you information about how to care for yourself after your procedure. Your health care provider may also give you more specific instructions. If you have problems or questions, contact your health care provider. What can I expect after the procedure? After the procedure, it is common to have:  Pain, tenderness, and swelling around your incision.  Headaches. Follow these instructions at home: Medicines  Take Brilinta 45 mg (1/2 tablet) by mouth once daily until 2 week follow-up. Discontinue Aspirin use at this time.  Take Eliquis 5 mg by mouth twice daily per cardiology. Incision care  Follow instructions from your health care provider about how to take care of your incision. Make sure you: ? Wash your hands with soap and water before you change your bandage. ? Ok to remove bandage from right groin tomorrow (4/10).  Check your incision area every day for signs of infection. Check for: ? Redness, swelling, or pain. ? Fluid or blood. ? Warmth. ? Pus or a bad smell. Activity  Advance activity as tolerated.  Do not drive yourself until 2 week follow-up.  Do not stoop, bend, or lift anything that is heavier than 10 lb (4.5 kg) until 2 week follow-up. Eating and drinking  Drink enough fluid to keep your urine pale yellow.  Eat a healthy diet. This includes plenty of fruits and vegetables, whole grains, low-fat dairy products, and lean protein. General instructions  Do not use any products that contain nicotine or tobacco, such as cigarettes and e-cigarettes. If you need help quitting, ask your health care provider.  Ok to shower 48 hours post-procedure. Do not take baths, swim, or use a hot tub for 7 days post-procedure.  Manage your stress. If you need help with this, talk with your health care provider.  Keep all follow-up visits as told by your health care provider. This is important. Contact a health  care provider if:  You have redness, swelling, or pain around your incision.  You have fluid or blood coming from your incision.  Your incision feels warm to the touch.  You have pus or a bad smell coming from your incision.  You have a fever. Get help right away if:  You have: ? Stiffness in your neck. ? Pain, numbness, weakness, or swelling in your legs. ? Severe chest pain. ? Difficulty breathing. ? Confusion.  You have any symptoms of stroke. "BE FAST" is an easy way to remember the main warning signs of stroke: ? B - Balance. Signs are dizziness, sudden trouble walking, or loss of balance. ? E - Eyes. Signs are trouble seeing or a sudden change in vision. ? F - Face. Signs are sudden weakness or numbness of the face, or the face or eyelid drooping on one side. ? A - Arms. Signs are weakness or numbness in an arm. This happens suddenly and usually on one side of the body. ? S - Speech. Signs are sudden trouble speaking, slurred speech, or trouble understanding what people say. ? T - Time. Time to call emergency services. Write down what time symptoms started.  You have other signs of stroke, such as: ? A sudden, severe headache that does not get better with medicine. ? Sudden nausea or vomiting. ? A seizure. These symptoms may represent a serious problem that is an emergency. Do not wait to see if the symptoms will go away. Get medical help right away. Call your local emergency services (911 in  the [place] Do not drive yourself to the hospital. This information is not intended to replace advice given to you by your health care provider. Make sure you discuss any questions you have with your health care provider. Document Revised: 09/22/2018 Document Reviewed: 11/08/2016 Elsevier Patient Education  2020 [employer].

## 2019-11-23 NOTE — Progress Notes (Signed)
Progress Note  Patient Name: Richard Graham Date of Encounter: 11/23/2019  Primary Cardiologist: Armanda Magic, MD   Subjective   Patient feels well and "really wants to go outside and walk and get some vitamin D"  Inpatient Medications    Scheduled Meds: . amiodarone  200 mg Oral BID  . aspirin  81 mg Oral Daily   Or  . aspirin  81 mg Per Tube Daily  . Chlorhexidine Gluconate Cloth  6 each Topical Daily  . ticagrelor  30 mg Oral BID   Or  . ticagrelor  30 mg Per Tube BID   Continuous Infusions: . sodium chloride 75 mL/hr at 11/23/19 1000  . clevidipine     PRN Meds: acetaminophen **OR** acetaminophen (TYLENOL) oral liquid 160 mg/5 mL **OR** acetaminophen, oxyCODONE-acetaminophen **AND** oxyCODONE, temazepam   Vital Signs    Vitals:   11/23/19 0700 11/23/19 0800 11/23/19 0900 11/23/19 1000  BP: 101/70 106/78 103/81 113/90  Pulse: (!) 205 (!) 110 (!) 137 (!) 118  Resp: 14 (!) 24 (!) 23 20  Temp:  98 F (36.7 C)    TempSrc:  Oral    SpO2: (!) 89% 97% 94% 95%  Weight:      Height:        Intake/Output Summary (Last 24 hours) at 11/23/2019 1107 Last data filed at 11/23/2019 1000 Gross per 24 hour  Intake 2599.68 ml  Output 2050 ml  Net 549.68 ml   Last 3 Weights 11/22/2019 11/21/2019 11/16/2019  Weight (lbs) 225 lb 225 lb 223 lb  Weight (kg) 102.059 kg 102.059 kg 101.152 kg      Telemetry    Afib rates 80-110 - Personally Reviewed  ECG    No new - Personally Reviewed  Physical Exam   GEN: No acute distress.   Neck: No JVD Cardiac: iRRR, no murmurs, rubs, or gallops.  Respiratory: Clear to auscultation bilaterally. GI: Soft, nontender, non-distended  MS: No edema; No deformity. Neuro:  Nonfocal  Psych: Normal affect  VASC: groin site stable. No hematoma Labs    High Sensitivity Troponin:   Recent Labs  Lab 11/05/19 1731 11/05/19 1822 11/05/19 2053  TROPONINIHS 11 14 12       Chemistry Recent Labs  Lab 11/22/19 0616 11/23/19 0910  NA 137  135  K 4.4 4.7  CL 95* 99  CO2 27 25  GLUCOSE 113* 137*  BUN 23 15  CREATININE 1.04 0.81  CALCIUM 9.6 9.1  GFRNONAA >60 >60  GFRAA >60 >60  ANIONGAP 15 11     Hematology Recent Labs  Lab 11/22/19 0616 11/23/19 0910  WBC 7.5 10.6*  RBC 5.21 4.80  HGB 15.4 14.2  HCT 49.2 44.9  MCV 94.4 93.5  MCH 29.6 29.6  MCHC 31.3 31.6  RDW 14.2 14.0  PLT 288 259    BNPNo results for input(s): BNP, PROBNP in the last 168 hours.   DDimer No results for input(s): DDIMER in the last 168 hours.   Radiology    No results found.  Cardiac Studies   None this admission  Patient Profile      Assessment & Plan    Richard Graham is a 64 y.o. male with a hx of atrial fibrillation, systolic heart failure, cerebral aneurysm, CAD (CTO of LAD) and prediabetes who is being seen today for the evaluation of A. fib/anticoagulation at the request of Dr. 77.  1.  Atrial fibrillation: Discussed in great detail with patient his wife regarding ,risks, benefits  and alternatives to [facility] for afib. Also discussed bleeding risk on multiple antiplatelet/anticoagulates for various indications.  Initiate eliquis tomorrow morning, 5 mg BID. Continue brilinta per IR. Will hold aspirin until IR follow up in 2 weeks, at which point IR may discontinue brilinta and resume aspirin. I have described for the patient that he will be on indefinite aspirin for secondary prevention of CAD and indefinite Eliquis for stroke risk in atrial fibrillation with. I confirmed with patient and his wife 3 separate times that they understand and agree with this plan.  This patients CHA2DS2-VASc Score and unadjusted Ischemic Stroke Rate (% per year) is equal to 2.2 % stroke rate/year from a score of 2 Above score calculated as 1 point each if present [CHF, HTN, DM, Vascular=MI/PAD/Aortic Plaque, Age if 65-74, or Male] Above score calculated as 2 points each if present [Age > 75, or Stroke/TIA/TE]  2.  Systolic heart failure:  resume home meds upon discharge.  3.  Cerebral aneurysm: Status post embolization using web device.  Management per IR  4.  CAD: Underwent cardiac cath during last hospitalization showing CTO of the LAD.  Plan will be to eventually undergo viability study to determine whether CTO intervention is reasonable. --On aspirin and statin   Pt to go home today per IR.  Follow up has been arranged.   For questions or updates, please contact [place] Please consult [email or web address] for contact info under        Signed, Elouise Munroe, MD  11/23/2019, 11:07 AM

## 2019-11-26 ENCOUNTER — Telehealth (HOSPITAL_COMMUNITY): Payer: Self-pay

## 2019-11-26 NOTE — Telephone Encounter (Signed)
Called to schedule 2 wk f/u, no answer, left vm. AW 

## 2019-12-03 ENCOUNTER — Telehealth (HOSPITAL_COMMUNITY): Payer: Self-pay

## 2019-12-03 NOTE — Telephone Encounter (Signed)
Pt called very upset about his Brilinta. He isn't sure how much he is supposed to be taking. I have sent a message to our PA to give him a call to clarify. Pt agreed with this plan. AW

## 2019-12-07 ENCOUNTER — Ambulatory Visit (HOSPITAL_COMMUNITY)
Admission: RE | Admit: 2019-12-07 | Discharge: 2019-12-07 | Disposition: A | Discharge status: Home / Self Care | Payer: Commercial Managed Care - PPO | Source: Ambulatory Visit | Attending: Student | Admitting: Student

## 2019-12-07 NOTE — Progress Notes (Signed)
Due to CODE STROKE patient's follow-up appointment will need to be rescheduled.  Called and spoke with patient's wife to notify.   Loyce Dys, MS RD PA-C 1:33 PM

## 2019-12-10 ENCOUNTER — Telehealth: Payer: Self-pay | Admitting: Student

## 2019-12-10 NOTE — Telephone Encounter (Signed)
NIR.  Called patient at 1009 to discuss medication changes per Dr. Corliss Skains. Instructed patient to discontinue taking Brilinta 45 mg once daily. Instructed patient to begin taking Aspirin 325 mg once daily. All questions answered and concerns addressed.   Waylan Boga Lianah Peed, PA-C 12/10/2019, 10:14 AM

## 2019-12-10 NOTE — Progress Notes (Signed)
Cardiology Office Note:    Date:  12/11/2019   ID:  [name], DOB [birth year], MRN [id number]  PCP:  Patient, No Pcp Per  Cardiologist:  Armanda Magic, MD    Referring MD: No ref. provider found   Chief Complaint  Patient presents with  . Coronary Artery Disease  . Hypertension  . Hyperlipidemia  . Atrial Fibrillation  . Congestive Heart Failure    History of Present Illness:    Richard Graham is a 64 y.o. male with a hx of atrial fibrillation, systolic heart failure, cerebral aneurysm, CAD (CTO of LAD) and prediabetes.  He was initially seen 11/05/2019 when he presented to ER with significant weight gain, lower extremity edema, orthopnea and dyspnea on exertion.  He was found to be in new onset atrial fibrillation with RVR, and new systolic heart failure with an EF of 20 to 25%.  Underwent cardiac catheterization with chronically occluded mid LAD with left to left collaterals.  Plan was for medical therapy but at some point could consider viability evaluation with possible CTO intervention.  An incidental finding of a cerebral aneurysm was found during this admission.  Stroke was ruled out, IR was consulted and Dr. Corliss Skains planned to perform angiography with possible embolization within a week and therefore not placed on oral anticoagulation.  Per Dr. Beverely Risen given the patient's P2 Y 12 was 7 during admission it was recommended that he be discharged on Brilinta 30 mg twice daily along with aspirin 81 mg daily as well as Amio for rate control of afib.  In regards to his Lasix dosing he was sent home on 80 mg daily with potassium supplement.  He then presented 11/22/2019 for right common carotid arteriogram followed by embolization of the right MCA bifurcation aneurysm by Dr. Bedelia Person with no complications.  Cardiology saw patient and recommended that once safe to go on DOAC would start apixaban and then plan for DCCV 3-4 weeks later.  Plan also was to undergo viability study  of anterior wall as outpt to see if viable myocardium and possible CTO of LAD.    He is here today for followup and is doing well.  He denies any chest pain or pressure, SOB, DOE, PND, orthopnea, LE edema, dizziness, palpitations or syncope. He is compliant with his meds and is tolerating meds with no SE.    Past Medical History:  Diagnosis Date  . CHF (congestive heart failure) (HCC)   . Gout   . New onset atrial fibrillation (HCC)   . Pre-diabetes    diet controlled, no meds    Past Surgical History:  Procedure Laterality Date  . COLONOSCOPY    . INSERTION OF MESH N/A 06/27/2015   Procedure: INSERTION OF MESH;  Surgeon: Axel Filler, MD;  Location: [facility];  Service: General;  Laterality: N/A;  . IR 3D INDEPENDENT WKST  11/14/2019  . IR 3D INDEPENDENT WKST  11/22/2019  . IR ANGIO INTRA EXTRACRAN SEL COM CAROTID INNOMINATE BILAT MOD SED  11/14/2019  . IR ANGIO INTRA EXTRACRAN SEL INTERNAL CAROTID UNI R MOD SED  11/22/2019  . IR ANGIO VERTEBRAL SEL VERTEBRAL BILAT MOD SED  11/14/2019  . IR ANGIOGRAM FOLLOW UP STUDY  11/22/2019  . IR CT HEAD LTD  11/22/2019  . IR NEURO EACH ADD'L AFTER BASIC UNI RIGHT (MS)  11/22/2019  . IR TRANSCATH/EMBOLIZ  11/22/2019  . laceration to fingers     left hand  . MOUTH SURGERY     secondary  to abscess   . RADIOLOGY WITH ANESTHESIA N/A 11/22/2019   Procedure: EMBOLIZATION;  Surgeon: Julieanne Cotton, MD;  Location: [facility];  Service: Radiology;  Laterality: N/A;  . RIGHT/LEFT HEART CATH AND CORONARY ANGIOGRAPHY N/A 11/12/2019   Procedure: RIGHT/LEFT HEART CATH AND CORONARY ANGIOGRAPHY;  Surgeon: Corky Crafts, MD;  Location: [facility] INVASIVE CV LAB;  Service: Cardiovascular;  Laterality: N/A;  . trauma to lip      sutured  . VENTRAL HERNIA REPAIR N/A 06/27/2015   Procedure: LAPAROSCOPIC VENTRAL HERNIA;  Surgeon: Axel Filler, MD;  Location: [facility];  Service: General;  Laterality: N/A;    Current Medications: Current Meds  Medication Sig  . apixaban  (ELIQUIS) 5 MG TABS tablet Take 1 tablet (5 mg total) by mouth 2 (two) times daily.  Marland Kitchen aspirin 325 MG tablet Take 325 mg by mouth daily.  . colchicine 0.6 MG tablet Take 0.6 mg by mouth 2 (two) times daily as needed (for gout flares).   . diclofenac sodium (VOLTAREN) 1 % GEL Apply 2 g topically 2 (two) times daily as needed (to affected sites- for arthritis pain).   . furosemide (LASIX) 40 MG tablet Take 2 tablets (80 mg total) by mouth daily. (Patient taking differently: Take 40 mg by mouth 2 (two) times daily. )  . metoprolol tartrate (LOPRESSOR) 50 MG tablet Take 1 tablet (50 mg total) by mouth 2 (two) times daily.  . Omega-3 Fatty Acids (FISH OIL) 1200 MG CAPS Take 1,200 mg by mouth daily.   Marland Kitchen OVER THE COUNTER MEDICATION Take 1 tablet by mouth daily. Ultimate Omega  . oxyCODONE-acetaminophen (PERCOCET) 10-325 MG tablet Take 1 tablet by mouth every 4 (four) hours as needed for pain.  . potassium chloride SA (KLOR-CON) 20 MEQ tablet Take 1 tablet (20 mEq total) by mouth daily.  . rosuvastatin (CRESTOR) 5 MG tablet Take 1 tablet (5 mg total) by mouth daily.  . temazepam (RESTORIL) 30 MG capsule Take 1 capsule (30 mg total) by mouth at bedtime as needed for sleep. (Patient taking differently: Take 30 mg by mouth at bedtime. )     Allergies:   Patient has no known allergies.   Social History   Socioeconomic History  . Marital status: Married    Spouse name: Not on file  . Number of children: Not on file  . Years of education: Not on file  . Highest education level: Not on file  Occupational History  . Not on file  Tobacco Use  . Smoking status: Former Smoker    Packs/day: 0.25    Years: 3.00    Pack years: 0.75    Types: Cigarettes    Quit date: 08/17/2011    Years since quitting: 8.3  . Smokeless tobacco: Never Used  Substance and Sexual Activity  . Alcohol use: Yes    Comment: occasional wine  . Drug use: No  . Sexual activity: Yes  Other Topics Concern  . Not on file  Social  History Narrative  . Not on file   Social Determinants of Health   Financial Resource Strain:   . Difficulty of Paying Living Expenses:   Food Insecurity:   . Worried About Programme researcher, broadcasting/film/video in the Last Year:   . Barista in the Last Year:   Transportation Needs:   . Freight forwarder (Medical):   Marland Kitchen Lack of Transportation (Non-Medical):   Physical Activity:   . Days of Exercise per Week:   . Minutes  of Exercise per Session:   Stress:   . Feeling of Stress :   Social Connections:   . Frequency of Communication with Friends and Family:   . Frequency of Social Gatherings with Friends and Family:   . Attends Religious Services:   . Active Member of Clubs or Organizations:   . Attends Archivist Meetings:   Marland Kitchen Marital Status:      Family History: The patient's family history is not on file.  ROS:   Please see the history of present illness.    ROS  All other systems reviewed and negative.   EKGs/Labs/Other Studies Reviewed:    The following studies were reviewed today: Cardiac cath, 2D echo  Relevant CV Studies:  Cath: 11/12/19   Mid LAD lesion is 100% stenosed. Chronically occluded with left to left collaterals.  LV end diastolic pressure is mildly elevated.  There is severe left ventricular systolic dysfunction.  The left ventricular ejection fraction is less than 25% by visual estimate.  There is no aortic valve stenosis.  Hemodynamic findings consistent with mild pulmonary hypertension.  Consider viability study of anterior wall. If viable, would plan for CTO PCI of LAD. If no chance of recovery, then medical therapy without revascularization.   Diagnostic Dominance: Left  Echo: 11/06/19  IMPRESSIONS    1. LV function is severely reduced, 20-25%. The mid anteroseptum into  apex is akinetic, almost aneurysmal, consistent with prior LAD infarction  (Q waves on EKG). The myocardium is thinned, futher supporting prior    infarction. On contrast imaging, there  is no LV thrombus seen.  2. Left ventricular ejection fraction, by estimation, is 20 to 25%. The  left ventricle has severely decreased function. The left ventricle  demonstrates regional wall motion abnormalities (see scoring  diagram/findings for description). There is mild  concentric left ventricular hypertrophy. Left ventricular diastolic  function could not be evaluated.  3. Right ventricular systolic function is mildly reduced. The right  ventricular size is normal. There is mildly elevated pulmonary artery  systolic pressure. The estimated right ventricular systolic pressure is  78.2 mmHg.  4. Left atrial size was mildly dilated.  5. The mitral valve is grossly normal. Trivial mitral valve  regurgitation. No evidence of mitral stenosis.  6. The aortic valve is tricuspid. Aortic valve regurgitation is not  visualized. No aortic stenosis is present.    EKG:  EKG is not ordered today.   Recent Labs: 11/05/2019: B Natriuretic Peptide 318.7; TSH 1.067 11/06/2019: ALT 21 11/14/2019: Magnesium 2.3 11/23/2019: BUN 15; Creatinine, Ser 0.81; Hemoglobin 14.2; Platelets 259; Potassium 4.7; Sodium 135   Recent Lipid Panel    Component Value Date/Time   CHOL 103 11/07/2019 0500   TRIG 36 11/07/2019 0500   HDL 40 (L) 11/07/2019 0500   CHOLHDL 2.6 11/07/2019 0500   VLDL 7 11/07/2019 0500   LDLCALC 56 11/07/2019 0500    Physical Exam:    VS:  BP 110/78   Pulse 67   Ht 6\' 1"  (1.854 m)   Wt 239 lb (108.4 kg)   SpO2 97%   BMI 31.53 kg/m     Wt Readings from Last 3 Encounters:  12/11/19 239 lb (108.4 kg)  11/22/19 225 lb (102.1 kg)  11/16/19 223 lb (101.2 kg)     GEN:  Well nourished, well developed in no acute distress HEENT: Normal NECK: No JVD; No carotid bruits LYMPHATICS: No lymphadenopathy CARDIAC: RRR, no murmurs, rubs, gallops RESPIRATORY:  Clear to auscultation without  rales, wheezing or rhonchi  ABDOMEN: Soft,  non-tender, non-distended MUSCULOSKELETAL:  No edema; No deformity  SKIN: Warm and dry NEUROLOGIC:  Alert and oriented x 3 PSYCHIATRIC:  Normal affect   ASSESSMENT:    1. New onset a-fib (HCC)   2. Chronic systolic heart failure (HCC)   3. Brain aneurysm   4. Coronary artery disease due to lipid rich plaque   5. Hyperlipidemia LDL goal <70    PLAN:    In order of problems listed above:  1.  Atrial fibrillation:  -This was found during his recent hospitalization and started on Amio 200mg  BID for rate control.  -Decision was made to hold DOAC at that time as he would require invasive therapy for his cerebral aneurysm. -he is now on Apixaban 5mg  BID which was started on 11/23/2019 -will refer to afib clinic for guidance on further AAD therapy.  Given his young age, would prefer to get him off Amio -once he has had 4 weeks of anticoagulation with no missed doses then plan DCCV pending what afib clinic wants to do with AADT. Will leave timing of DCCV up to them once AADT has been decided  2. Chronic  Systolic heart failure:  -EF noted to be 20-25% during prior admission.   -he appears euvolemic on exam today -continue Lopressor 50mg  BID for now and consider changing to Carvedilol once back in NSR -start Entresto 24-26mg  BID and Spiro 12.5mg  daily -will cut Lasix back to 40mg  daily since he does not look volume overloaded and allow more Bp room for uptitration of HF meds -followup with PHarmD in 2 weeks for uptitration of HF meds -repeat 2D echo after he is on max med therapy and has had CTO of LAD -BMET in 1 week  3.  Cerebral aneurysm:  -Status post embolization using web device.   -continue ASA 325mg  daily per Interventional neurorads  4.  CAD:  -s/p  cath during last hospitalization showing CTO of the LAD.   -will order viability study to determined if CTO intervention on LAD would be of benefit -continue statin, BB and ASA -will check with Dr. if we can decrease  ASA to 81mg  daily given need to be on DOAC  5.  HLD -LDL goal < 70 -continue Crestor 5mg  daily -LDL 56 on labs in March    Medication Adjustments/Labs and Tests Ordered: Current medicines are reviewed at length with the patient today.  Concerns regarding medicines are outlined above.  No orders of the defined types were placed in this encounter.  No orders of the defined types were placed in this encounter.   Signed, , MD  12/11/2019 10:21 AM    [facility]

## 2019-12-11 ENCOUNTER — Ambulatory Visit (INDEPENDENT_AMBULATORY_CARE_PROVIDER_SITE_OTHER): Payer: Commercial Managed Care - PPO | Admitting: Cardiology

## 2019-12-11 ENCOUNTER — Encounter: Payer: Self-pay | Admitting: Cardiology

## 2019-12-11 ENCOUNTER — Other Ambulatory Visit: Payer: Self-pay | Admitting: Physician Assistant

## 2019-12-11 ENCOUNTER — Telehealth: Payer: Self-pay | Admitting: *Deleted

## 2019-12-11 ENCOUNTER — Other Ambulatory Visit: Payer: Self-pay

## 2019-12-11 VITALS — BP 110/78 | HR 67 | Ht 73.0 in | Wt 239.0 lb

## 2019-12-11 DIAGNOSIS — I2510 Atherosclerotic heart disease of native coronary artery without angina pectoris: Secondary | ICD-10-CM

## 2019-12-11 DIAGNOSIS — I671 Cerebral aneurysm, nonruptured: Secondary | ICD-10-CM | POA: Diagnosis not present

## 2019-12-11 DIAGNOSIS — I4891 Unspecified atrial fibrillation: Secondary | ICD-10-CM | POA: Diagnosis not present

## 2019-12-11 DIAGNOSIS — I2583 Coronary atherosclerosis due to lipid rich plaque: Secondary | ICD-10-CM

## 2019-12-11 DIAGNOSIS — I5022 Chronic systolic (congestive) heart failure: Secondary | ICD-10-CM

## 2019-12-11 DIAGNOSIS — E785 Hyperlipidemia, unspecified: Secondary | ICD-10-CM

## 2019-12-11 MED ORDER — AMIODARONE HCL 200 MG PO TABS: 200.0000 mg | ORAL_TABLET | Freq: Every day | ORAL | 1 refills | Status: DC

## 2019-12-11 MED ORDER — FUROSEMIDE 40 MG PO TABS: 40.0000 mg | ORAL_TABLET | Freq: Every day | ORAL | 3 refills | Status: DC

## 2019-12-11 MED ORDER — SPIRONOLACTONE 25 MG PO TABS: 12.5000 mg | ORAL_TABLET | Freq: Every day | ORAL | 3 refills | Status: DC

## 2019-12-11 MED ORDER — ENTRESTO 24-26 MG PO TABS: 1.0000 | ORAL_TABLET | Freq: Two times a day (BID) | ORAL | 1 refills | Status: DC

## 2019-12-11 NOTE — Patient Instructions (Signed)
Medication Instructions:  Your physician has recommended you make the following change in your medication:  1) START taking Entresto 24-26 mg twice per day  2) START taking spironolactone 12.5 mg (1/2 tablet) daily  3) DECREASE Lasix (furosemide) to 40 mg daily   *If you need a refill on your cardiac medications before your next appointment, please call your pharmacy*   Lab Work: BMET in one week If you have labs (blood work) drawn today and your tests are completely normal, you will receive your results only by: Marland Kitchen MyChart Message (if you have MyChart) OR . A paper copy in the mail If you have any lab test that is abnormal or we need to change your treatment, we will call you to review the results.   Testing/Procedures: Your provider has requested that you have an MRI viability study   Follow-Up: At [facility], you and your health needs are our priority.  As part of our continuing mission to provide you with exceptional heart care, we have created designated Provider Care Teams.  These Care Teams include your primary Cardiologist (physician) and Advanced Practice Providers (APPs -  Physician Assistants and Nurse Practitioners) who all work together to provide you with the care you need, when you need it.  Your next appointment:   2 month(s)  The format for your next appointment:   In Person  Provider:   Armanda Magic, MD   Other Instructions You have been referred to see our PharmD in the Hypertension Clinic in two weeks for uptitration of your heart failure medications.   You have been referred to the Atrial Fibrillation Clinic.

## 2019-12-11 NOTE — Telephone Encounter (Signed)
-----   Message from Theresia Majors, RN sent at 12/11/2019 11:39 AM EDT ----- Home sleep test has been ordered.  Thanks!

## 2019-12-11 NOTE — Telephone Encounter (Signed)
Mr. Petrelli called because he has some concerns about how to take his medications.  1.  He wants to know if he is still supposed to be taking the amiodarone. -Upon review of Dr. Norris Cross note, she does not believe amiodarone is a long-term drug, but did not say anything about stopping it right now. -He got a 14-day supply that should be running out, so I sent in a prescription for amiodarone 200 mg daily, 30 tablets. -Advised him this may not be a long-term medication so he should make sure to ask the EP specialist if he is supposed to keep taking it.  2.  Medication timing -The spironolactone and Entresto are new, he wonders if he can take those medications along with his other drugs -Advised him that it will be a good idea to space out the medications as he is able at first.  Suggested that he take the twice daily drugs at 8 AM/8 PM and at 10 AM/10 PM at first. -Once his body tolerates the medications, it is okay to take them all at once -He prefers to split them up at first to make sure that he tolerates them -He is encouraged to contact us if he has any additional questions or concerns.  Theodore Demark, PA-C 12/11/2019 7:04 PM

## 2019-12-11 NOTE — Addendum Note (Signed)
Addended by: Theresia Majors on: 12/11/2019 04:40 PM   Modules accepted: Orders

## 2019-12-12 ENCOUNTER — Ambulatory Visit (HOSPITAL_COMMUNITY)
Admission: RE | Admit: 2019-12-12 | Discharge: 2019-12-12 | Disposition: A | Discharge status: Home / Self Care | Payer: Commercial Managed Care - PPO | Source: Ambulatory Visit | Attending: Student | Admitting: Student

## 2019-12-12 ENCOUNTER — Telehealth: Payer: Self-pay | Admitting: Cardiology

## 2019-12-12 DIAGNOSIS — I671 Cerebral aneurysm, nonruptured: Secondary | ICD-10-CM

## 2019-12-12 NOTE — Telephone Encounter (Signed)
Patient calling to speak with Carly, Dr. Norris Cross nurse. He states he spoke to her earlier and was told if he had anymore questions to call back. Please advise.

## 2019-12-12 NOTE — Progress Notes (Signed)
Chief Complaint: Patient was seen in consultation today for right MCA bifurcation aneurysm s/p embolization.  Referring Physician(s): Nelson Chimes, Ankit Chirag  Supervising Physician: Julieanne Cotton  Patient Status: [facility] - Out-pt  History of Present Illness: Richard Graham is a 64 y.o. male with a past medical history as below, with pertinent past medical history including atrial fibrillation on chronic anticoagulation with Eliquis, HF, pre-diabetes, and OSA. He is known to [facility] and has been followed by Dr. Corliss Skains since 10/2019. He first presented to our department at the request of Dr. Nelson Chimes for management of an incidental finding of a right MCA bifurcation aneurysm, seen on MR brain during work-up for confusion. He underwent an image-guided cerebral arteriogram with embolization of right MCA bifurcation aneurysm using WEB device 11/22/2019 by Dr. Corliss Skains. He was discharged home 11/23/2019 in stable condition.  Patient presents today for follow-up regarding his recent procedure 11/22/2019. Patient awake and alert sitting in chair. Complains of intermittent dull headaches. Complains of bruising of right groin that has "spread". States it has improved (not as dark, not as painful), but has migrated down his leg. Denies weakness, numbness/tingling, dizziness, vision changes, hearing changes, tinnitus, or speech difficulty.  Currently taking Aspirin 325 mg once daily and Eliquis 5 mg twice daily.   Past Medical History:  Diagnosis Date  . CHF (congestive heart failure) (HCC)   . Gout   . New onset atrial fibrillation (HCC)   . Pre-diabetes    diet controlled, no meds    Past Surgical History:  Procedure Laterality Date  . COLONOSCOPY    . INSERTION OF MESH N/A 06/27/2015   Procedure: INSERTION OF MESH;  Surgeon: Axel Filler, MD;  Location: [facility] ORS;  Service: General;  Laterality: N/A;  . IR 3D INDEPENDENT WKST  11/14/2019  . IR 3D INDEPENDENT WKST  11/22/2019  . IR ANGIO INTRA  EXTRACRAN SEL COM CAROTID INNOMINATE BILAT MOD SED  11/14/2019  . IR ANGIO INTRA EXTRACRAN SEL INTERNAL CAROTID UNI R MOD SED  11/22/2019  . IR ANGIO VERTEBRAL SEL VERTEBRAL BILAT MOD SED  11/14/2019  . IR ANGIOGRAM FOLLOW UP STUDY  11/22/2019  . IR CT HEAD LTD  11/22/2019  . IR NEURO EACH ADD'L AFTER BASIC UNI RIGHT (MS)  11/22/2019  . IR TRANSCATH/EMBOLIZ  11/22/2019  . laceration to fingers     left hand  . MOUTH SURGERY     secondary to abscess   . RADIOLOGY WITH ANESTHESIA N/A 11/22/2019   Procedure: EMBOLIZATION;  Surgeon: Julieanne Cotton, MD;  Location: [facility] OR;  Service: Radiology;  Laterality: N/A;  . RIGHT/LEFT HEART CATH AND CORONARY ANGIOGRAPHY N/A 11/12/2019   Procedure: RIGHT/LEFT HEART CATH AND CORONARY ANGIOGRAPHY;  Surgeon: Corky Crafts, MD;  Location: [facility] INVASIVE CV LAB;  Service: Cardiovascular;  Laterality: N/A;  . trauma to lip      sutured  . VENTRAL HERNIA REPAIR N/A 06/27/2015   Procedure: LAPAROSCOPIC VENTRAL HERNIA;  Surgeon: Axel Filler, MD;  Location: [facility] ORS;  Service: General;  Laterality: N/A;    Allergies: Patient has no known allergies.  Medications: Prior to Admission medications   Medication Sig Start Date End Date Taking? Authorizing Provider  amiodarone (PACERONE) 200 MG tablet Take 1 tablet (200 mg total) by mouth 2 (two) times daily for 14 days. 11/16/19 11/30/19  Danford, Earl Lites, MD  amiodarone (PACERONE) 200 MG tablet Take 1 tablet (200 mg total) by mouth daily. 12/11/19   Barrett, Joline Salt, PA-C  apixaban (ELIQUIS) 5 MG  TABS tablet Take 1 tablet (5 mg total) by mouth 2 (two) times daily. 11/23/19   Julieanne Cotton, MD  aspirin 325 MG tablet Take 325 mg by mouth daily.    [provider]  colchicine 0.6 MG tablet Take 0.6 mg by mouth 2 (two) times daily as needed (for gout flares).     [provider]  diclofenac sodium (VOLTAREN) 1 % GEL Apply 2 g topically 2 (two) times daily as needed (to affected sites- for arthritis  pain).     [provider]  furosemide (LASIX) 40 MG tablet Take 1 tablet (40 mg total) by mouth daily. 12/11/19   Quintella Reichert, MD  metoprolol tartrate (LOPRESSOR) 50 MG tablet Take 1 tablet (50 mg total) by mouth 2 (two) times daily. 11/16/19   Danford, Earl Lites, MD  Omega-3 Fatty Acids (FISH OIL) 1200 MG CAPS Take 1,200 mg by mouth daily.     [provider]  OVER THE COUNTER MEDICATION Take 1 tablet by mouth daily. Ultimate Omega    [provider]  oxyCODONE-acetaminophen (PERCOCET) 10-325 MG tablet Take 1 tablet by mouth every 4 (four) hours as needed for pain. 06/27/15   Axel Filler, MD  potassium chloride SA (KLOR-CON) 20 MEQ tablet Take 1 tablet (20 mEq total) by mouth daily. 11/16/19   Danford, Earl Lites, MD  rosuvastatin (CRESTOR) 5 MG tablet Take 1 tablet (5 mg total) by mouth daily. 11/17/19   Danford, Earl Lites, MD  sacubitril-valsartan (ENTRESTO) 24-26 MG Take 1 tablet by mouth 2 (two) times daily. 12/11/19   Quintella Reichert, MD  spironolactone (ALDACTONE) 25 MG tablet Take 0.5 tablets (12.5 mg total) by mouth daily. 12/11/19   Quintella Reichert, MD  temazepam (RESTORIL) 30 MG capsule Take 1 capsule (30 mg total) by mouth at bedtime as needed for sleep. Patient taking differently: Take 30 mg by mouth at bedtime.  11/16/19   Danford, Earl Lites, MD     No family history on file.  Social History   Socioeconomic History  . Marital status: Married    Spouse name: Not on file  . Number of children: Not on file  . Years of education: Not on file  . Highest education level: Not on file  Occupational History  . Not on file  Tobacco Use  . Smoking status: Former Smoker    Packs/day: 0.25    Years: 3.00    Pack years: 0.75    Types: Cigarettes    Quit date: 08/17/2011    Years since quitting: 8.3  . Smokeless tobacco: Never Used  Substance and Sexual Activity  . Alcohol use: Yes    Comment: occasional wine  . Drug use: No  . Sexual  activity: Yes  Other Topics Concern  . Not on file  Social History Narrative  . Not on file   Social Determinants of Health   Financial Resource Strain:   . Difficulty of Paying Living Expenses:   Food Insecurity:   . Worried About Programme researcher, broadcasting/film/video in the Last Year:   . Barista in the Last Year:   Transportation Needs:   . Freight forwarder (Medical):   Marland Kitchen Lack of Transportation (Non-Medical):   Physical Activity:   . Days of Exercise per Week:   . Minutes of Exercise per Session:   Stress:   . Feeling of Stress :   Social Connections:   . Frequency of Communication with Friends and Family:   .  Frequency of Social Gatherings with Friends and Family:   . Attends Religious Services:   . Active Member of Clubs or Organizations:   . Attends Banker Meetings:   Marland Kitchen Marital Status:      Review of Systems: A 12 point ROS discussed and pertinent positives are indicated in the HPI above.  All other systems are negative.  Review of Systems  Constitutional: Negative for chills and fever.  HENT: Negative for hearing loss and tinnitus.   Eyes: Negative for visual disturbance.  Respiratory: Negative for shortness of breath and wheezing.   Cardiovascular: Negative for chest pain and palpitations.  Neurological: Positive for headaches. Negative for dizziness, speech difficulty, weakness and numbness.  Psychiatric/Behavioral: Negative for behavioral problems and confusion.    Vital Signs: There were no vitals taken for this visit.  Physical Exam Constitutional:      General: He is not in acute distress.    Appearance: Normal appearance.  Pulmonary:     Effort: Pulmonary effort is normal. No respiratory distress.  Skin:    General: Skin is warm and dry.     Comments: Right groin incision (at femoral access site) soft without tenderness, active bleeding, or hematoma. Approximate fist size ecchymosis on medial/posterior distal thigh above popliteal fossa,  no tenderness or hematoma.  Neurological:     Mental Status: He is alert and oriented to person, place, and time.  Psychiatric:        Mood and Affect: Mood normal.        Behavior: Behavior normal.      Imaging: IR Transcath/Emboliz  Result Date: 11/26/2019 CLINICAL DATA:  Discovery of a large right middle cerebral artery aneurysm during recent hospitalization for congestive heart failure. Family history of intracranial aneurysms. Patient with atrial fibrillation requiring anticoagulation. EXAM: TRANSCATHETER THERAPY EMBOLIZATION; IR ANGIO INTRA EXTRACRAN SEL INTERNAL CAROTID UNI RIGHT MOD SED; IR NEURO EACH ADD'L AFTER BASIC UNI RIGHT (MS); WORKSTATION 3D RECONSTRUCTION; ARTERIOGRAPHY; IR CT HEAD LIMITED COMPARISON:  Diagnostic arteriogram of November 14, 2019. MEDICATIONS: Heparin 4,500 units IV; Ancef 2 g IV antibiotic was administered within 1 hour of the procedure. ANESTHESIA/SEDATION: General anesthesia. CONTRAST:  Isovue 300 approximately 100 mL. FLUOROSCOPY TIME:  Fluoroscopy Time: 49 minutes 12 seconds (3548 mGy). COMPLICATIONS: None immediate. TECHNIQUE: Informed written consent was obtained from the patient after a thorough discussion of the procedural risks, benefits and alternatives. All questions were addressed. Maximal Sterile Barrier Technique was utilized including caps, mask, sterile gowns, sterile gloves, sterile drape, hand hygiene and skin antiseptic. A timeout was performed prior to the initiation of the procedure. The right groin was prepped and draped in the usual sterile fashion. Thereafter using modified Seldinger technique, transfemoral access into the right common femoral artery was obtained without difficulty. Over a 0.035 inch guidewire, a 5 French Pinnacle sheath was inserted. Through this, and also over 0.035 inch guidewire, a 5 [place] JB 1 catheter was advanced to the aortic arch region and selectively positioned in the right common carotid artery. FINDINGS: The right  common carotid arteriogram demonstrates the right external carotid artery and its major branches to be widely patent. The right internal carotid artery at the bulb to the cranial skull base also demonstrates wide patency. The petrous, the cavernous and the supraclinoid segments are widely patent. There is a mild focal fusiform dilatation of the distal cavernous segment of the right internal carotid artery. The right middle cerebral artery and the right anterior cerebral artery opacify into the capillary and venous  phases. Cross filling via the anterior communicating artery of the left anterior cerebral artery distally is seen. The right middle cerebral artery bifurcation region again demonstrates the presence of the large wide neck saccular aneurysm projecting inferiorly and laterally. A 3D rotational arteriogram was then performed centered over the aneurysm. This again demonstrates the wide neck aneurysm with the superior and inferior divisions arising just proximal at the neck of the aneurysm. The DSA measurements of the aneurysm were approximately 7.63 mm x 7.26 vs 6.47 mm. ENDOVASCULAR TREATMENT OF THE RIGHT MIDDLE CEREBRAL ARTERY WIDE NECK RIGHT MIDDLE CEREBRAL ARTERY ANEURYSM The diagnostic JB 1 catheter in the right common carotid artery was then exchanged over a 0.035 inch 300 cm Rosen exchange guidewire for an 8 French short Pinnacle sheath in the right groin which was then connected to continuous heparinized saline infusion. Over the [facility] exchange guidewire, a 90 cm 8 [place] Neuron Max sheath was advanced initially in the right common carotid bifurcation. The guidewire was removed. Good aspiration obtained from the hub of the Neuron Max sheath. Using biplane roadmap technique and constant fluoroscopic guidance, over a 0.035 inch Roadrunner guidewire, a 6 French 115 cm Sofia guide catheter was then advanced to the proximal cavernous segment of the right internal carotid artery. The guidewire was removed.  Good aspiration obtained from the hub of the 6 Chad guide catheter. A control arteriogram performed at this site demonstrated no change in the extracranial or intracranial circulation of the right cerebral hemisphere. An 027 Via microcatheter was then advanced over a 0.014 inch standard Synchro micro guidewire with a J configuration to avoid dissections and inducing spasm to the supraclinoid right ICA. The 6 Chad guide catheter was advanced into the proximal cavernous segment of the right internal carotid artery. The micro guidewire was then gently advanced using a torque device, into the middle of the saccular aneurysm followed by the microcatheter. The wire was then gently retrieved under constant fluoroscopic guidance ensuring no movement of the microcatheter tip. Free aspiration was obtained from the hub of the microcatheter which was connected to continuous heparinized saline infusion. Initially, a 9 mm x 7 mm WEB device was chosen. This was prepped and purged of air immersed in a saline bath. This was then loaded onto the microcatheter using biplane roadmap technique and consult fluoroscopic guidance. Slow advancement of the device was performed to where the distal marker on the microcatheter was then lined with the distal marker on the device. With slight forward traction with the right hand on the delivery micro guidewire with the left hand the delivery microcatheter was gently and slowly unsheathed until a flat configuration of the device was formed. Thereafter, the combination of the microcatheter and the device combination was advanced gently until the device was seen to form within the entirety of the aneurysm. However, on the lateral projection it was felt the device was slightly over sized. The device was then recaptured into the microcatheter with the microcatheter in the middle half of the aneurysm and retrieved and removed. A smaller device measuring 8 mm x 6 mm was again prepped and  purged with heparinized saline infusion immersed in water. Thereafter, it was recaptured in the delivery microcatheter, was advanced in a coaxial manner and with constant heparinized saline infusion to the distal end of the intra-aneurysmal microcatheter. The entire system was then straightened to relieve forward inertia. Thereafter, with the tip of the microcatheter in the mid aneurysm, and slight forward gentle traction with right  hand on the delivery micro guidewire, with the left hand the microcatheter was retrieved gently unsheathing the device until 70% of the device had been unsheathed. With a flat configuration now obtained, the combination of the microcatheter and the device was slightly gently advanced forward until complete with the aneurysm had been achieved by the device, and stable configuration of the device was ascertained on the native floor images. Slight traction was applied to encourage the flat configuration formation at the neck of the wide neck aneurysm. Once achieved, the microcatheter was gently retrieved until the gap was achieved between the proximal markers on the device, and also the distal microcatheter. The separation of the microcatheter from the device was witnessed on fluoroscopy. The microcatheter and the delivery micro guidewire were then gently retrieved and removed. A control arteriogram was then performed immediately at 15 and 35 minutes post deployment. These continued to demonstrate excellent stasis within the entirety of the aneurysm with no change in the configuration device itself. The runs continued to demonstrate no evidence of intraluminal filling defects within the superior and inferior divisions. The patient's ACT was maintained in the region of approximately 200 seconds. The Dyna CT the brain was undertaken in the usual manner with hand injection of 1-2% concentration of contrast. Reconstructed images demonstrated the stable configuration of the device, with no  impingement or coverage of the origins of the superior or the inferior divisions. A final control arteriogram performed through the 6 Chad guide catheter in the right internal carotid artery continued to demonstrate complete stasis of contrast within the web device into the delayed venous phases. No evidence of abnormal filling defects were seen in superior or the inferior division of the right middle cerebral artery, or of the anterior cerebral artery. The 6 Chad guide catheter, and the Neuron Max sheath were retrieved and removed. The were right groin Pinnacle sheath was removed with the successful application of an 8 French Angio-Seal closure device. Distal pulses remained palpable in the dorsalis pedis, and the posterior tibial regions bilaterally. Patient was then extubated. Upon recovery, the patient denied any headaches, nausea, vomiting, visual or motor symptoms. No pronation drift of his outstretched hands. Patient was then transferred to the neuro ICU to continue on low-dose IV heparin, and close neurologic monitoring and control of blood pressure. Overnight the patient's stay was unremarkable. The patient was also evaluated by cardiology as per previous plan. The following day, the patient remained neurologically stable and intact. The right groin appeared soft. Patient was able to tolerate oral feeds without difficulty. The patient was then ambulated and assessed prior to discharge home following cardiology clearance with special instructions to continue on baby aspirin indefinitely, and to take 45 mg of Brilinta for 2 weeks. The patient will be followed in the clinic in approximately 2 weeks post discharge. He was asked to maintain adequate hydration. He was also encouraged to steadily increase his level of activity as tolerated given his cardiac problems. 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  PLAN: Follow-up in the clinic 2 weeks post discharge. Electronically Signed   By: Julieanne Cotton M.D.   On: 11/23/2019 10:54   IR Angiogram Follow Up Study  Result Date: 11/26/2019 CLINICAL DATA:  Discovery of a large right middle cerebral artery aneurysm during recent hospitalization for congestive heart failure. Family history of intracranial aneurysms. Patient with atrial fibrillation requiring anticoagulation. EXAM: TRANSCATHETER THERAPY EMBOLIZATION; IR ANGIO INTRA  EXTRACRAN SEL INTERNAL CAROTID UNI RIGHT MOD SED; IR NEURO EACH ADD'L AFTER BASIC UNI RIGHT (MS); WORKSTATION 3D RECONSTRUCTION; ARTERIOGRAPHY; IR CT HEAD LIMITED COMPARISON:  Diagnostic arteriogram of November 14, 2019. MEDICATIONS: Heparin 4,500 units IV; Ancef 2 g IV antibiotic was administered within 1 hour of the procedure. ANESTHESIA/SEDATION: General anesthesia. CONTRAST:  Isovue 300 approximately 100 mL. FLUOROSCOPY TIME:  Fluoroscopy Time: 49 minutes 12 seconds (3548 mGy). COMPLICATIONS: None immediate. TECHNIQUE: Informed written consent was obtained from the patient after a thorough discussion of the procedural risks, benefits and alternatives. All questions were addressed. Maximal Sterile Barrier Technique was utilized including caps, mask, sterile gowns, sterile gloves, sterile drape, hand hygiene and skin antiseptic. A timeout was performed prior to the initiation of the procedure. The right groin was prepped and draped in the usual sterile fashion. Thereafter using modified Seldinger technique, transfemoral access into the right common femoral artery was obtained without difficulty. Over a 0.035 inch guidewire, a 5 French Pinnacle sheath was inserted. Through this, and also over 0.035 inch guidewire, a 5 [place] JB 1 catheter was advanced to the aortic arch region and selectively positioned in the right common carotid artery. FINDINGS: The right common carotid arteriogram demonstrates the right external carotid artery and its major  branches to be widely patent. The right internal carotid artery at the bulb to the cranial skull base also demonstrates wide patency. The petrous, the cavernous and the supraclinoid segments are widely patent. There is a mild focal fusiform dilatation of the distal cavernous segment of the right internal carotid artery. The right middle cerebral artery and the right anterior cerebral artery opacify into the capillary and venous phases. Cross filling via the anterior communicating artery of the left anterior cerebral artery distally is seen. The right middle cerebral artery bifurcation region again demonstrates the presence of the large wide neck saccular aneurysm projecting inferiorly and laterally. A 3D rotational arteriogram was then performed centered over the aneurysm. This again demonstrates the wide neck aneurysm with the superior and inferior divisions arising just proximal at the neck of the aneurysm. The DSA measurements of the aneurysm were approximately 7.63 mm x 7.26 vs 6.47 mm. ENDOVASCULAR TREATMENT OF THE RIGHT MIDDLE CEREBRAL ARTERY WIDE NECK RIGHT MIDDLE CEREBRAL ARTERY ANEURYSM The diagnostic JB 1 catheter in the right common carotid artery was then exchanged over a 0.035 inch 300 cm Rosen exchange guidewire for an 8 French short Pinnacle sheath in the right groin which was then connected to continuous heparinized saline infusion. Over the [facility] exchange guidewire, a 90 cm 8 [place] Neuron Max sheath was advanced initially in the right common carotid bifurcation. The guidewire was removed. Good aspiration obtained from the hub of the Neuron Max sheath. Using biplane roadmap technique and constant fluoroscopic guidance, over a 0.035 inch Roadrunner guidewire, a 6 French 115 cm Sofia guide catheter was then advanced to the proximal cavernous segment of the right internal carotid artery. The guidewire was removed. Good aspiration obtained from the hub of the 6 Chad guide catheter. A control  arteriogram performed at this site demonstrated no change in the extracranial or intracranial circulation of the right cerebral hemisphere. An 027 Via microcatheter was then advanced over a 0.014 inch standard Synchro micro guidewire with a J configuration to avoid dissections and inducing spasm to the supraclinoid right ICA. The 6 Chad guide catheter was advanced into the proximal cavernous segment of the right internal carotid artery. The micro guidewire was then gently advanced using a  torque device, into the middle of the saccular aneurysm followed by the microcatheter. The wire was then gently retrieved under constant fluoroscopic guidance ensuring no movement of the microcatheter tip. Free aspiration was obtained from the hub of the microcatheter which was connected to continuous heparinized saline infusion. Initially, a 9 mm x 7 mm WEB device was chosen. This was prepped and purged of air immersed in a saline bath. This was then loaded onto the microcatheter using biplane roadmap technique and consult fluoroscopic guidance. Slow advancement of the device was performed to where the distal marker on the microcatheter was then lined with the distal marker on the device. With slight forward traction with the right hand on the delivery micro guidewire with the left hand the delivery microcatheter was gently and slowly unsheathed until a flat configuration of the device was formed. Thereafter, the combination of the microcatheter and the device combination was advanced gently until the device was seen to form within the entirety of the aneurysm. However, on the lateral projection it was felt the device was slightly over sized. The device was then recaptured into the microcatheter with the microcatheter in the middle half of the aneurysm and retrieved and removed. A smaller device measuring 8 mm x 6 mm was again prepped and purged with heparinized saline infusion immersed in water. Thereafter, it was  recaptured in the delivery microcatheter, was advanced in a coaxial manner and with constant heparinized saline infusion to the distal end of the intra-aneurysmal microcatheter. The entire system was then straightened to relieve forward inertia. Thereafter, with the tip of the microcatheter in the mid aneurysm, and slight forward gentle traction with right hand on the delivery micro guidewire, with the left hand the microcatheter was retrieved gently unsheathing the device until 70% of the device had been unsheathed. With a flat configuration now obtained, the combination of the microcatheter and the device was slightly gently advanced forward until complete with the aneurysm had been achieved by the device, and stable configuration of the device was ascertained on the native floor images. Slight traction was applied to encourage the flat configuration formation at the neck of the wide neck aneurysm. Once achieved, the microcatheter was gently retrieved until the gap was achieved between the proximal markers on the device, and also the distal microcatheter. The separation of the microcatheter from the device was witnessed on fluoroscopy. The microcatheter and the delivery micro guidewire were then gently retrieved and removed. A control arteriogram was then performed immediately at 15 and 35 minutes post deployment. These continued to demonstrate excellent stasis within the entirety of the aneurysm with no change in the configuration device itself. The runs continued to demonstrate no evidence of intraluminal filling defects within the superior and inferior divisions. The patient's ACT was maintained in the region of approximately 200 seconds. The Dyna CT the brain was undertaken in the usual manner with hand injection of 1-2% concentration of contrast. Reconstructed images demonstrated the stable configuration of the device, with no impingement or coverage of the origins of the superior or the inferior divisions. A  final control arteriogram performed through the 6 Chad guide catheter in the right internal carotid artery continued to demonstrate complete stasis of contrast within the web device into the delayed venous phases. No evidence of abnormal filling defects were seen in superior or the inferior division of the right middle cerebral artery, or of the anterior cerebral artery. The 6 Chad guide catheter, and the Neuron Max sheath were  retrieved and removed. The were right groin Pinnacle sheath was removed with the successful application of an 8 French Angio-Seal closure device. Distal pulses remained palpable in the dorsalis pedis, and the posterior tibial regions bilaterally. Patient was then extubated. Upon recovery, the patient denied any headaches, nausea, vomiting, visual or motor symptoms. No pronation drift of his outstretched hands. Patient was then transferred to the neuro ICU to continue on low-dose IV heparin, and close neurologic monitoring and control of blood pressure. Overnight the patient's stay was unremarkable. The patient was also evaluated by cardiology as per previous plan. The following day, the patient remained neurologically stable and intact. The right groin appeared soft. Patient was able to tolerate oral feeds without difficulty. The patient was then ambulated and assessed prior to discharge home following cardiology clearance with special instructions to continue on baby aspirin indefinitely, and to take 45 mg of Brilinta for 2 weeks. The patient will be followed in the clinic in approximately 2 weeks post discharge. He was asked to maintain adequate hydration. He was also encouraged to steadily increase his level of activity as tolerated given his cardiac problems. 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 PLAN: Follow-up in the clinic 2 weeks post discharge. Electronically Signed   By:  Julieanne Cotton M.D.   On: 11/23/2019 10:54   CARDIAC CATHETERIZATION  Result Date: 11/12/2019  Mid LAD lesion is 100% stenosed. Chronically occluded with left to left collaterals.  LV end diastolic pressure is mildly elevated.  There is severe left ventricular systolic dysfunction.  The left ventricular ejection fraction is less than 25% by visual estimate.  There is no aortic valve stenosis.  Hemodynamic findings consistent with mild pulmonary hypertension.  Consider viability study of anterior wall.  If viable, would plan for CTO PCI of LAD.  If no chance of recovery, then medical therapy without revascularization. Explained to wife, Waynetta Sandy [phone]   IR 3D Independent Annabell Sabal  Result Date: 11/26/2019 CLINICAL DATA:  Discovery of a large right middle cerebral artery aneurysm during recent hospitalization for congestive heart failure. Family history of intracranial aneurysms. Patient with atrial fibrillation requiring anticoagulation. EXAM: TRANSCATHETER THERAPY EMBOLIZATION; IR ANGIO INTRA EXTRACRAN SEL INTERNAL CAROTID UNI RIGHT MOD SED; IR NEURO EACH ADD'L AFTER BASIC UNI RIGHT (MS); WORKSTATION 3D RECONSTRUCTION; ARTERIOGRAPHY; IR CT HEAD LIMITED COMPARISON:  Diagnostic arteriogram of November 14, 2019. MEDICATIONS: Heparin 4,500 units IV; Ancef 2 g IV antibiotic was administered within 1 hour of the procedure. ANESTHESIA/SEDATION: General anesthesia. CONTRAST:  Isovue 300 approximately 100 mL. FLUOROSCOPY TIME:  Fluoroscopy Time: 49 minutes 12 seconds (3548 mGy). COMPLICATIONS: None immediate. TECHNIQUE: Informed written consent was obtained from the patient after a thorough discussion of the procedural risks, benefits and alternatives. All questions were addressed. Maximal Sterile Barrier Technique was utilized including caps, mask, sterile gowns, sterile gloves, sterile drape, hand hygiene and skin antiseptic. A timeout was performed prior to the initiation of the procedure. The right groin was  prepped and draped in the usual sterile fashion. Thereafter using modified Seldinger technique, transfemoral access into the right common femoral artery was obtained without difficulty. Over a 0.035 inch guidewire, a 5 French Pinnacle sheath was inserted. Through this, and also over 0.035 inch guidewire, a 5 [place] JB 1 catheter was advanced to the aortic arch region and selectively positioned in the right common carotid artery. FINDINGS: The right common carotid arteriogram demonstrates the right external carotid artery and  its major branches to be widely patent. The right internal carotid artery at the bulb to the cranial skull base also demonstrates wide patency. The petrous, the cavernous and the supraclinoid segments are widely patent. There is a mild focal fusiform dilatation of the distal cavernous segment of the right internal carotid artery. The right middle cerebral artery and the right anterior cerebral artery opacify into the capillary and venous phases. Cross filling via the anterior communicating artery of the left anterior cerebral artery distally is seen. The right middle cerebral artery bifurcation region again demonstrates the presence of the large wide neck saccular aneurysm projecting inferiorly and laterally. A 3D rotational arteriogram was then performed centered over the aneurysm. This again demonstrates the wide neck aneurysm with the superior and inferior divisions arising just proximal at the neck of the aneurysm. The DSA measurements of the aneurysm were approximately 7.63 mm x 7.26 vs 6.47 mm. ENDOVASCULAR TREATMENT OF THE RIGHT MIDDLE CEREBRAL ARTERY WIDE NECK RIGHT MIDDLE CEREBRAL ARTERY ANEURYSM The diagnostic JB 1 catheter in the right common carotid artery was then exchanged over a 0.035 inch 300 cm Rosen exchange guidewire for an 8 French short Pinnacle sheath in the right groin which was then connected to continuous heparinized saline infusion. Over the [facility] exchange guidewire, a  90 cm 8 [place] Neuron Max sheath was advanced initially in the right common carotid bifurcation. The guidewire was removed. Good aspiration obtained from the hub of the Neuron Max sheath. Using biplane roadmap technique and constant fluoroscopic guidance, over a 0.035 inch Roadrunner guidewire, a 6 French 115 cm Sofia guide catheter was then advanced to the proximal cavernous segment of the right internal carotid artery. The guidewire was removed. Good aspiration obtained from the hub of the 6 Chad guide catheter. A control arteriogram performed at this site demonstrated no change in the extracranial or intracranial circulation of the right cerebral hemisphere. An 027 Via microcatheter was then advanced over a 0.014 inch standard Synchro micro guidewire with a J configuration to avoid dissections and inducing spasm to the supraclinoid right ICA. The 6 Chad guide catheter was advanced into the proximal cavernous segment of the right internal carotid artery. The micro guidewire was then gently advanced using a torque device, into the middle of the saccular aneurysm followed by the microcatheter. The wire was then gently retrieved under constant fluoroscopic guidance ensuring no movement of the microcatheter tip. Free aspiration was obtained from the hub of the microcatheter which was connected to continuous heparinized saline infusion. Initially, a 9 mm x 7 mm WEB device was chosen. This was prepped and purged of air immersed in a saline bath. This was then loaded onto the microcatheter using biplane roadmap technique and consult fluoroscopic guidance. Slow advancement of the device was performed to where the distal marker on the microcatheter was then lined with the distal marker on the device. With slight forward traction with the right hand on the delivery micro guidewire with the left hand the delivery microcatheter was gently and slowly unsheathed until a flat configuration of the device was  formed. Thereafter, the combination of the microcatheter and the device combination was advanced gently until the device was seen to form within the entirety of the aneurysm. However, on the lateral projection it was felt the device was slightly over sized. The device was then recaptured into the microcatheter with the microcatheter in the middle half of the aneurysm and retrieved and removed. A smaller device measuring 8 mm x  6 mm was again prepped and purged with heparinized saline infusion immersed in water. Thereafter, it was recaptured in the delivery microcatheter, was advanced in a coaxial manner and with constant heparinized saline infusion to the distal end of the intra-aneurysmal microcatheter. The entire system was then straightened to relieve forward inertia. Thereafter, with the tip of the microcatheter in the mid aneurysm, and slight forward gentle traction with right hand on the delivery micro guidewire, with the left hand the microcatheter was retrieved gently unsheathing the device until 70% of the device had been unsheathed. With a flat configuration now obtained, the combination of the microcatheter and the device was slightly gently advanced forward until complete with the aneurysm had been achieved by the device, and stable configuration of the device was ascertained on the native floor images. Slight traction was applied to encourage the flat configuration formation at the neck of the wide neck aneurysm. Once achieved, the microcatheter was gently retrieved until the gap was achieved between the proximal markers on the device, and also the distal microcatheter. The separation of the microcatheter from the device was witnessed on fluoroscopy. The microcatheter and the delivery micro guidewire were then gently retrieved and removed. A control arteriogram was then performed immediately at 15 and 35 minutes post deployment. These continued to demonstrate excellent stasis within the entirety of the  aneurysm with no change in the configuration device itself. The runs continued to demonstrate no evidence of intraluminal filling defects within the superior and inferior divisions. The patient's ACT was maintained in the region of approximately 200 seconds. The Dyna CT the brain was undertaken in the usual manner with hand injection of 1-2% concentration of contrast. Reconstructed images demonstrated the stable configuration of the device, with no impingement or coverage of the origins of the superior or the inferior divisions. A final control arteriogram performed through the 6 Chad guide catheter in the right internal carotid artery continued to demonstrate complete stasis of contrast within the web device into the delayed venous phases. No evidence of abnormal filling defects were seen in superior or the inferior division of the right middle cerebral artery, or of the anterior cerebral artery. The 6 Chad guide catheter, and the Neuron Max sheath were retrieved and removed. The were right groin Pinnacle sheath was removed with the successful application of an 8 French Angio-Seal closure device. Distal pulses remained palpable in the dorsalis pedis, and the posterior tibial regions bilaterally. Patient was then extubated. Upon recovery, the patient denied any headaches, nausea, vomiting, visual or motor symptoms. No pronation drift of his outstretched hands. Patient was then transferred to the neuro ICU to continue on low-dose IV heparin, and close neurologic monitoring and control of blood pressure. Overnight the patient's stay was unremarkable. The patient was also evaluated by cardiology as per previous plan. The following day, the patient remained neurologically stable and intact. The right groin appeared soft. Patient was able to tolerate oral feeds without difficulty. The patient was then ambulated and assessed prior to discharge home following cardiology clearance with special instructions  to continue on baby aspirin indefinitely, and to take 45 mg of Brilinta for 2 weeks. The patient will be followed in the clinic in approximately 2 weeks post discharge. He was asked to maintain adequate hydration. He was also encouraged to steadily increase his level of activity as tolerated given his cardiac problems. 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 PLAN:  Follow-up in the clinic 2 weeks post discharge. Electronically Signed   By: Julieanne Cotton M.D.   On: 11/23/2019 10:54   IR 3D Independent Annabell Sabal  Result Date: 11/19/2019 CLINICAL DATA:  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 EXAM: BILATERAL COMMON CAROTID AND INNOMINATE ANGIOGRAPHY COMPARISON:  MRI MRA examination of November 07, 2019. MEDICATIONS: Heparin 1000 units IV. No antibiotic was administered within 1 hour of the procedure. ANESTHESIA/SEDATION: Versed 0.5 mg IV; Fentanyl 12.5 mcg IV Moderate Sedation Time:  48 minutes The patient was continuously monitored during the procedure by the interventional radiology nurse under my direct supervision. CONTRAST:  Isovue 300 approximately 90 mL. FLUOROSCOPY TIME:  Fluoroscopy Time: 11 minutes 30 seconds (1622 mGy). COMPLICATIONS: None immediate. TECHNIQUE: Informed written consent was obtained from the patient after a thorough discussion of the procedural risks, benefits and alternatives. All questions were addressed. Maximal Sterile Barrier Technique was utilized including caps, mask, sterile gowns, sterile gloves, sterile drape, hand hygiene and skin antiseptic. A timeout was performed prior to the initiation of the procedure. The right groin was prepped and draped in the usual sterile fashion. Thereafter using modified Seldinger technique, transfemoral access into the right common femoral artery was obtained without difficulty. Over a 0.035 inch guidewire, a 5 French  Pinnacle sheath was inserted. Through this, and also over 0.035 inch guidewire, a 5 [place] JB 1 catheter was advanced to the aortic arch region and selectively positioned in the right common carotid artery, the right vertebral artery, the left common carotid artery and the left vertebral artery. Also performed was a 3D rotational arteriogram of the right anterior circulation via the right common carotid artery injection with subsequent 3D reconstruction. FINDINGS: The innominate arteriogram demonstrates the right subclavian artery and the right common carotid artery to be widely patent. The right common carotid arteriogram demonstrates the right external carotid artery and its major branches to be widely patent. The right internal carotid artery at the bulb to the cranial skull base demonstrates wide patency. The right middle cerebral artery and the right anterior cerebral artery opacify into the capillary and venous phases. Mild atherosclerotic disease is seen in the caval cavernous segment of the right internal carotid artery. Arising in the right middle cerebral artery bifurcation region is a large saccular aneurysm with a wide neck projecting inferiorly and slightly laterally. A 3D rotational and imaging with subsequent reconstruction on a separate workstation confirms the presence of an approximately 8 mm x 8.7 mm saccular aneurysm with a wide neck. The superior and inferior divisions of the right middle cerebral artery arise at the neck of the aneurysm. Cross-filling via the anterior communicating artery of the left anterior cerebral A2 segment is noted. The origin the right vertebral artery is widely patent. The vessel is seen to opacify to the cranial skull base. Wide patency is seen of the right vertebrobasilar junction and the right posterior-inferior cerebellar artery. The opacified portion of the basilar artery, the posterior cerebral arteries, the superior cerebellar arteries and anterior-inferior  cerebellar arteries is seen into the capillary and venous phases. Unopacified blood is seen in the proximal basilar artery from the contralateral vertebral artery. The left common carotid arteriogram demonstrates the origin of the left external carotid artery and its major branches to be widely patent. The left internal carotid artery at the bulb to the cranial skull base is widely patent. The petrous, cavernous and the supraclinoid segments are widely patent. The left middle cerebral artery and the  left anterior cerebral artery opacify into the capillary and venous phases. Cross-filling via the anterior communicating artery of the right anterior cerebral A2 segment is seen. The left vertebral artery origin is widely patent. The vessel has a moderate tortuous course in its proximal aspect. More distally the vessel is seen to opacify to the left vertebrobasilar junction. Opacification of the left posterior-inferior cerebellar artery is seen. The opacified portion of the basilar artery, the posterior cerebral arteries, the superior cerebellar arteries is seen into the capillary and venous phases. Unopacified blood is seen in the basilar artery from the contralateral vertebral artery. IMPRESSION: Approximately 8 mm x 8.7 mm wide neck saccular aneurysm arising in the right MCA bifurcation region. PLAN: Findings reviewed with the patient and the patient's referring physician. Endovascular treatment options considered and agreed upon. Endovascular treatment will be scheduled as soon as possible. Electronically Signed   By: Julieanne Cotton M.D.   On: 11/16/2019 16:08   IR CT Head Ltd  Result Date: 11/26/2019 CLINICAL DATA:  Discovery of a large right middle cerebral artery aneurysm during recent hospitalization for congestive heart failure. Family history of intracranial aneurysms. Patient with atrial fibrillation requiring anticoagulation. EXAM: TRANSCATHETER THERAPY EMBOLIZATION; IR ANGIO INTRA EXTRACRAN SEL  INTERNAL CAROTID UNI RIGHT MOD SED; IR NEURO EACH ADD'L AFTER BASIC UNI RIGHT (MS); WORKSTATION 3D RECONSTRUCTION; ARTERIOGRAPHY; IR CT HEAD LIMITED COMPARISON:  Diagnostic arteriogram of November 14, 2019. MEDICATIONS: Heparin 4,500 units IV; Ancef 2 g IV antibiotic was administered within 1 hour of the procedure. ANESTHESIA/SEDATION: General anesthesia. CONTRAST:  Isovue 300 approximately 100 mL. FLUOROSCOPY TIME:  Fluoroscopy Time: 49 minutes 12 seconds (3548 mGy). COMPLICATIONS: None immediate. TECHNIQUE: Informed written consent was obtained from the patient after a thorough discussion of the procedural risks, benefits and alternatives. All questions were addressed. Maximal Sterile Barrier Technique was utilized including caps, mask, sterile gowns, sterile gloves, sterile drape, hand hygiene and skin antiseptic. A timeout was performed prior to the initiation of the procedure. The right groin was prepped and draped in the usual sterile fashion. Thereafter using modified Seldinger technique, transfemoral access into the right common femoral artery was obtained without difficulty. Over a 0.035 inch guidewire, a 5 French Pinnacle sheath was inserted. Through this, and also over 0.035 inch guidewire, a 5 [place] JB 1 catheter was advanced to the aortic arch region and selectively positioned in the right common carotid artery. FINDINGS: The right common carotid arteriogram demonstrates the right external carotid artery and its major branches to be widely patent. The right internal carotid artery at the bulb to the cranial skull base also demonstrates wide patency. The petrous, the cavernous and the supraclinoid segments are widely patent. There is a mild focal fusiform dilatation of the distal cavernous segment of the right internal carotid artery. The right middle cerebral artery and the right anterior cerebral artery opacify into the capillary and venous phases. Cross filling via the anterior communicating artery of  the left anterior cerebral artery distally is seen. The right middle cerebral artery bifurcation region again demonstrates the presence of the large wide neck saccular aneurysm projecting inferiorly and laterally. A 3D rotational arteriogram was then performed centered over the aneurysm. This again demonstrates the wide neck aneurysm with the superior and inferior divisions arising just proximal at the neck of the aneurysm. The DSA measurements of the aneurysm were approximately 7.63 mm x 7.26 vs 6.47 mm. ENDOVASCULAR TREATMENT OF THE RIGHT MIDDLE CEREBRAL ARTERY WIDE NECK RIGHT MIDDLE CEREBRAL ARTERY ANEURYSM The diagnostic JB  1 catheter in the right common carotid artery was then exchanged over a 0.035 inch 300 cm Rosen exchange guidewire for an 8 French short Pinnacle sheath in the right groin which was then connected to continuous heparinized saline infusion. Over the [facility] guidewire, a 90 cm 8 [place] Neuron Max sheath was advanced initially in the right common carotid bifurcation. The guidewire was removed. Good aspiration obtained from the hub of the Neuron Max sheath. Using biplane roadmap technique and constant fluoroscopic guidance, over a 0.035 inch Roadrunner guidewire, a 6 French 115 cm Sofia guide catheter was then advanced to the proximal cavernous segment of the right internal carotid artery. The guidewire was removed. Good aspiration obtained from the hub of the 6 ChadFrench Sofia guide catheter. A control arteriogram performed at this site demonstrated no change in the extracranial or intracranial circulation of the right cerebral hemisphere. An 027 Via microcatheter was then advanced over a 0.014 inch standard Synchro micro guidewire with a J configuration to avoid dissections and inducing spasm to the supraclinoid right ICA. The 6 ChadFrench Sofia guide catheter was advanced into the proximal cavernous segment of the right internal carotid artery. The micro guidewire was then gently advanced  using a torque device, into the middle of the saccular aneurysm followed by the microcatheter. The wire was then gently retrieved under constant fluoroscopic guidance ensuring no movement of the microcatheter tip. Free aspiration was obtained from the hub of the microcatheter which was connected to continuous heparinized saline infusion. Initially, a 9 mm x 7 mm WEB device was chosen. This was prepped and purged of air immersed in a saline bath. This was then loaded onto the microcatheter using biplane roadmap technique and consult fluoroscopic guidance. Slow advancement of the device was performed to where the distal marker on the microcatheter was then lined with the distal marker on the device. With slight forward traction with the right hand on the delivery micro guidewire with the left hand the delivery microcatheter was gently and slowly unsheathed until a flat configuration of the device was formed. Thereafter, the combination of the microcatheter and the device combination was advanced gently until the device was seen to form within the entirety of the aneurysm. However, on the lateral projection it was felt the device was slightly over sized. The device was then recaptured into the microcatheter with the microcatheter in the middle half of the aneurysm and retrieved and removed. A smaller device measuring 8 mm x 6 mm was again prepped and purged with heparinized saline infusion immersed in water. Thereafter, it was recaptured in the delivery microcatheter, was advanced in a coaxial manner and with constant heparinized saline infusion to the distal end of the intra-aneurysmal microcatheter. The entire system was then straightened to relieve forward inertia. Thereafter, with the tip of the microcatheter in the mid aneurysm, and slight forward gentle traction with right hand on the delivery micro guidewire, with the left hand the microcatheter was retrieved gently unsheathing the device until 70% of the device  had been unsheathed. With a flat configuration now obtained, the combination of the microcatheter and the device was slightly gently advanced forward until complete with the aneurysm had been achieved by the device, and stable configuration of the device was ascertained on the native floor images. Slight traction was applied to encourage the flat configuration formation at the neck of the wide neck aneurysm. Once achieved, the microcatheter was gently retrieved until the gap was achieved between the proximal markers on the  device, and also the distal microcatheter. The separation of the microcatheter from the device was witnessed on fluoroscopy. The microcatheter and the delivery micro guidewire were then gently retrieved and removed. A control arteriogram was then performed immediately at 15 and 35 minutes post deployment. These continued to demonstrate excellent stasis within the entirety of the aneurysm with no change in the configuration device itself. The runs continued to demonstrate no evidence of intraluminal filling defects within the superior and inferior divisions. The patient's ACT was maintained in the region of approximately 200 seconds. The Dyna CT the brain was undertaken in the usual manner with hand injection of 1-2% concentration of contrast. Reconstructed images demonstrated the stable configuration of the device, with no impingement or coverage of the origins of the superior or the inferior divisions. A final control arteriogram performed through the 6 Chad guide catheter in the right internal carotid artery continued to demonstrate complete stasis of contrast within the web device into the delayed venous phases. No evidence of abnormal filling defects were seen in superior or the inferior division of the right middle cerebral artery, or of the anterior cerebral artery. The 6 Chad guide catheter, and the Neuron Max sheath were retrieved and removed. The were right groin Pinnacle  sheath was removed with the successful application of an 8 French Angio-Seal closure device. Distal pulses remained palpable in the dorsalis pedis, and the posterior tibial regions bilaterally. Patient was then extubated. Upon recovery, the patient denied any headaches, nausea, vomiting, visual or motor symptoms. No pronation drift of his outstretched hands. Patient was then transferred to the neuro ICU to continue on low-dose IV heparin, and close neurologic monitoring and control of blood pressure. Overnight the patient's stay was unremarkable. The patient was also evaluated by cardiology as per previous plan. The following day, the patient remained neurologically stable and intact. The right groin appeared soft. Patient was able to tolerate oral feeds without difficulty. The patient was then ambulated and assessed prior to discharge home following cardiology clearance with special instructions to continue on baby aspirin indefinitely, and to take 45 mg of Brilinta for 2 weeks. The patient will be followed in the clinic in approximately 2 weeks post discharge. He was asked to maintain adequate hydration. He was also encouraged to steadily increase his level of activity as tolerated given his cardiac problems. 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 PLAN: Follow-up in the clinic 2 weeks post discharge. Electronically Signed   By: Julieanne Cotton M.D.   On: 11/23/2019 10:54   IR ANGIO INTRA EXTRACRAN SEL COM CAROTID INNOMINATE BILAT MOD SED  Result Date: 11/19/2019 CLINICAL DATA:  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 EXAM: BILATERAL COMMON CAROTID AND INNOMINATE ANGIOGRAPHY COMPARISON:  MRI MRA examination of November 07, 2019. MEDICATIONS: Heparin 1000 units IV. No antibiotic was administered within 1 hour of the procedure. ANESTHESIA/SEDATION: Versed 0.5 mg IV; Fentanyl 12.5 mcg  IV Moderate Sedation Time:  48 minutes The patient was continuously monitored during the procedure by the interventional radiology nurse under my direct supervision. CONTRAST:  Isovue 300 approximately 90 mL. FLUOROSCOPY TIME:  Fluoroscopy Time: 11 minutes 30 seconds (1622 mGy). COMPLICATIONS: None immediate. TECHNIQUE: Informed written consent was obtained from the patient after a thorough discussion of the procedural risks, benefits and alternatives. All questions were addressed. Maximal Sterile Barrier Technique was utilized including caps, mask, sterile gowns, sterile gloves,  sterile drape, hand hygiene and skin antiseptic. A timeout was performed prior to the initiation of the procedure. The right groin was prepped and draped in the usual sterile fashion. Thereafter using modified Seldinger technique, transfemoral access into the right common femoral artery was obtained without difficulty. Over a 0.035 inch guidewire, a 5 French Pinnacle sheath was inserted. Through this, and also over 0.035 inch guidewire, a 5 [place] JB 1 catheter was advanced to the aortic arch region and selectively positioned in the right common carotid artery, the right vertebral artery, the left common carotid artery and the left vertebral artery. Also performed was a 3D rotational arteriogram of the right anterior circulation via the right common carotid artery injection with subsequent 3D reconstruction. FINDINGS: The innominate arteriogram demonstrates the right subclavian artery and the right common carotid artery to be widely patent. The right common carotid arteriogram demonstrates the right external carotid artery and its major branches to be widely patent. The right internal carotid artery at the bulb to the cranial skull base demonstrates wide patency. The right middle cerebral artery and the right anterior cerebral artery opacify into the capillary and venous phases. Mild atherosclerotic disease is seen in the caval cavernous  segment of the right internal carotid artery. Arising in the right middle cerebral artery bifurcation region is a large saccular aneurysm with a wide neck projecting inferiorly and slightly laterally. A 3D rotational and imaging with subsequent reconstruction on a separate workstation confirms the presence of an approximately 8 mm x 8.7 mm saccular aneurysm with a wide neck. The superior and inferior divisions of the right middle cerebral artery arise at the neck of the aneurysm. Cross-filling via the anterior communicating artery of the left anterior cerebral A2 segment is noted. The origin the right vertebral artery is widely patent. The vessel is seen to opacify to the cranial skull base. Wide patency is seen of the right vertebrobasilar junction and the right posterior-inferior cerebellar artery. The opacified portion of the basilar artery, the posterior cerebral arteries, the superior cerebellar arteries and anterior-inferior cerebellar arteries is seen into the capillary and venous phases. Unopacified blood is seen in the proximal basilar artery from the contralateral vertebral artery. The left common carotid arteriogram demonstrates the origin of the left external carotid artery and its major branches to be widely patent. The left internal carotid artery at the bulb to the cranial skull base is widely patent. The petrous, cavernous and the supraclinoid segments are widely patent. The left middle cerebral artery and the left anterior cerebral artery opacify into the capillary and venous phases. Cross-filling via the anterior communicating artery of the right anterior cerebral A2 segment is seen. The left vertebral artery origin is widely patent. The vessel has a moderate tortuous course in its proximal aspect. More distally the vessel is seen to opacify to the left vertebrobasilar junction. Opacification of the left posterior-inferior cerebellar artery is seen. The opacified portion of the basilar artery, the  posterior cerebral arteries, the superior cerebellar arteries is seen into the capillary and venous phases. Unopacified blood is seen in the basilar artery from the contralateral vertebral artery. IMPRESSION: Approximately 8 mm x 8.7 mm wide neck saccular aneurysm arising in the right MCA bifurcation region. PLAN: Findings reviewed with the patient and the patient's referring physician. Endovascular treatment options considered and agreed upon. Endovascular treatment will be scheduled as soon as possible. Electronically Signed   By: Julieanne Cotton M.D.   On: 11/16/2019 16:08   IR ANGIO INTRA EXTRACRAN  SEL INTERNAL CAROTID UNI R MOD SED  Result Date: 11/26/2019 CLINICAL DATA:  Discovery of a large right middle cerebral artery aneurysm during recent hospitalization for congestive heart failure. Family history of intracranial aneurysms. Patient with atrial fibrillation requiring anticoagulation. EXAM: TRANSCATHETER THERAPY EMBOLIZATION; IR ANGIO INTRA EXTRACRAN SEL INTERNAL CAROTID UNI RIGHT MOD SED; IR NEURO EACH ADD'L AFTER BASIC UNI RIGHT (MS); WORKSTATION 3D RECONSTRUCTION; ARTERIOGRAPHY; IR CT HEAD LIMITED COMPARISON:  Diagnostic arteriogram of November 14, 2019. MEDICATIONS: Heparin 4,500 units IV; Ancef 2 g IV antibiotic was administered within 1 hour of the procedure. ANESTHESIA/SEDATION: General anesthesia. CONTRAST:  Isovue 300 approximately 100 mL. FLUOROSCOPY TIME:  Fluoroscopy Time: 49 minutes 12 seconds (3548 mGy). COMPLICATIONS: None immediate. TECHNIQUE: Informed written consent was obtained from the patient after a thorough discussion of the procedural risks, benefits and alternatives. All questions were addressed. Maximal Sterile Barrier Technique was utilized including caps, mask, sterile gowns, sterile gloves, sterile drape, hand hygiene and skin antiseptic. A timeout was performed prior to the initiation of the procedure. The right groin was prepped and draped in the usual sterile fashion.  Thereafter using modified Seldinger technique, transfemoral access into the right common femoral artery was obtained without difficulty. Over a 0.035 inch guidewire, a 5 French Pinnacle sheath was inserted. Through this, and also over 0.035 inch guidewire, a 5 [place] JB 1 catheter was advanced to the aortic arch region and selectively positioned in the right common carotid artery. FINDINGS: The right common carotid arteriogram demonstrates the right external carotid artery and its major branches to be widely patent. The right internal carotid artery at the bulb to the cranial skull base also demonstrates wide patency. The petrous, the cavernous and the supraclinoid segments are widely patent. There is a mild focal fusiform dilatation of the distal cavernous segment of the right internal carotid artery. The right middle cerebral artery and the right anterior cerebral artery opacify into the capillary and venous phases. Cross filling via the anterior communicating artery of the left anterior cerebral artery distally is seen. The right middle cerebral artery bifurcation region again demonstrates the presence of the large wide neck saccular aneurysm projecting inferiorly and laterally. A 3D rotational arteriogram was then performed centered over the aneurysm. This again demonstrates the wide neck aneurysm with the superior and inferior divisions arising just proximal at the neck of the aneurysm. The DSA measurements of the aneurysm were approximately 7.63 mm x 7.26 vs 6.47 mm. ENDOVASCULAR TREATMENT OF THE RIGHT MIDDLE CEREBRAL ARTERY WIDE NECK RIGHT MIDDLE CEREBRAL ARTERY ANEURYSM The diagnostic JB 1 catheter in the right common carotid artery was then exchanged over a 0.035 inch 300 cm Rosen exchange guidewire for an 8 French short Pinnacle sheath in the right groin which was then connected to continuous heparinized saline infusion. Over the [facility] exchange guidewire, a 90 cm 8 [place] Neuron Max sheath was advanced  initially in the right common carotid bifurcation. The guidewire was removed. Good aspiration obtained from the hub of the Neuron Max sheath. Using biplane roadmap technique and constant fluoroscopic guidance, over a 0.035 inch Roadrunner guidewire, a 6 French 115 cm Sofia guide catheter was then advanced to the proximal cavernous segment of the right internal carotid artery. The guidewire was removed. Good aspiration obtained from the hub of the 6 Chad guide catheter. A control arteriogram performed at this site demonstrated no change in the extracranial or intracranial circulation of the right cerebral hemisphere. An 027 Via microcatheter was then advanced over a 0.014  inch standard Synchro micro guidewire with a J configuration to avoid dissections and inducing spasm to the supraclinoid right ICA. The 6 Chad guide catheter was advanced into the proximal cavernous segment of the right internal carotid artery. The micro guidewire was then gently advanced using a torque device, into the middle of the saccular aneurysm followed by the microcatheter. The wire was then gently retrieved under constant fluoroscopic guidance ensuring no movement of the microcatheter tip. Free aspiration was obtained from the hub of the microcatheter which was connected to continuous heparinized saline infusion. Initially, a 9 mm x 7 mm WEB device was chosen. This was prepped and purged of air immersed in a saline bath. This was then loaded onto the microcatheter using biplane roadmap technique and consult fluoroscopic guidance. Slow advancement of the device was performed to where the distal marker on the microcatheter was then lined with the distal marker on the device. With slight forward traction with the right hand on the delivery micro guidewire with the left hand the delivery microcatheter was gently and slowly unsheathed until a flat configuration of the device was formed. Thereafter, the combination of the  microcatheter and the device combination was advanced gently until the device was seen to form within the entirety of the aneurysm. However, on the lateral projection it was felt the device was slightly over sized. The device was then recaptured into the microcatheter with the microcatheter in the middle half of the aneurysm and retrieved and removed. A smaller device measuring 8 mm x 6 mm was again prepped and purged with heparinized saline infusion immersed in water. Thereafter, it was recaptured in the delivery microcatheter, was advanced in a coaxial manner and with constant heparinized saline infusion to the distal end of the intra-aneurysmal microcatheter. The entire system was then straightened to relieve forward inertia. Thereafter, with the tip of the microcatheter in the mid aneurysm, and slight forward gentle traction with right hand on the delivery micro guidewire, with the left hand the microcatheter was retrieved gently unsheathing the device until 70% of the device had been unsheathed. With a flat configuration now obtained, the combination of the microcatheter and the device was slightly gently advanced forward until complete with the aneurysm had been achieved by the device, and stable configuration of the device was ascertained on the native floor images. Slight traction was applied to encourage the flat configuration formation at the neck of the wide neck aneurysm. Once achieved, the microcatheter was gently retrieved until the gap was achieved between the proximal markers on the device, and also the distal microcatheter. The separation of the microcatheter from the device was witnessed on fluoroscopy. The microcatheter and the delivery micro guidewire were then gently retrieved and removed. A control arteriogram was then performed immediately at 15 and 35 minutes post deployment. These continued to demonstrate excellent stasis within the entirety of the aneurysm with no change in the  configuration device itself. The runs continued to demonstrate no evidence of intraluminal filling defects within the superior and inferior divisions. The patient's ACT was maintained in the region of approximately 200 seconds. The Dyna CT the brain was undertaken in the usual manner with hand injection of 1-2% concentration of contrast. Reconstructed images demonstrated the stable configuration of the device, with no impingement or coverage of the origins of the superior or the inferior divisions. A final control arteriogram performed through the 6 Chad guide catheter in the right internal carotid artery continued to demonstrate complete stasis of  contrast within the web device into the delayed venous phases. No evidence of abnormal filling defects were seen in superior or the inferior division of the right middle cerebral artery, or of the anterior cerebral artery. The 6 Chad guide catheter, and the Neuron Max sheath were retrieved and removed. The were right groin Pinnacle sheath was removed with the successful application of an 8 French Angio-Seal closure device. Distal pulses remained palpable in the dorsalis pedis, and the posterior tibial regions bilaterally. Patient was then extubated. Upon recovery, the patient denied any headaches, nausea, vomiting, visual or motor symptoms. No pronation drift of his outstretched hands. Patient was then transferred to the neuro ICU to continue on low-dose IV heparin, and close neurologic monitoring and control of blood pressure. Overnight the patient's stay was unremarkable. The patient was also evaluated by cardiology as per previous plan. The following day, the patient remained neurologically stable and intact. The right groin appeared soft. Patient was able to tolerate oral feeds without difficulty. The patient was then ambulated and assessed prior to discharge home following cardiology clearance with special instructions to continue on baby aspirin  indefinitely, and to take 45 mg of Brilinta for 2 weeks. The patient will be followed in the clinic in approximately 2 weeks post discharge. He was asked to maintain adequate hydration. He was also encouraged to steadily increase his level of activity as tolerated given his cardiac problems. 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 PLAN: Follow-up in the clinic 2 weeks post discharge. Electronically Signed   By: Julieanne Cotton M.D.   On: 11/23/2019 10:54   IR NEURO EACH ADD'L AFTER BASIC UNI RIGHT (MS)  Result Date: 11/26/2019 CLINICAL DATA:  Discovery of a large right middle cerebral artery aneurysm during recent hospitalization for congestive heart failure. Family history of intracranial aneurysms. Patient with atrial fibrillation requiring anticoagulation. EXAM: TRANSCATHETER THERAPY EMBOLIZATION; IR ANGIO INTRA EXTRACRAN SEL INTERNAL CAROTID UNI RIGHT MOD SED; IR NEURO EACH ADD'L AFTER BASIC UNI RIGHT (MS); WORKSTATION 3D RECONSTRUCTION; ARTERIOGRAPHY; IR CT HEAD LIMITED COMPARISON:  Diagnostic arteriogram of November 14, 2019. MEDICATIONS: Heparin 4,500 units IV; Ancef 2 g IV antibiotic was administered within 1 hour of the procedure. ANESTHESIA/SEDATION: General anesthesia. CONTRAST:  Isovue 300 approximately 100 mL. FLUOROSCOPY TIME:  Fluoroscopy Time: 49 minutes 12 seconds (3548 mGy). COMPLICATIONS: None immediate. TECHNIQUE: Informed written consent was obtained from the patient after a thorough discussion of the procedural risks, benefits and alternatives. All questions were addressed. Maximal Sterile Barrier Technique was utilized including caps, mask, sterile gowns, sterile gloves, sterile drape, hand hygiene and skin antiseptic. A timeout was performed prior to the initiation of the procedure. The right groin was prepped and draped in the usual sterile fashion. Thereafter using modified Seldinger technique,  transfemoral access into the right common femoral artery was obtained without difficulty. Over a 0.035 inch guidewire, a 5 French Pinnacle sheath was inserted. Through this, and also over 0.035 inch guidewire, a 5 [place] JB 1 catheter was advanced to the aortic arch region and selectively positioned in the right common carotid artery. FINDINGS: The right common carotid arteriogram demonstrates the right external carotid artery and its major branches to be widely patent. The right internal carotid artery at the bulb to the cranial skull base also demonstrates wide patency. The petrous, the cavernous and the supraclinoid segments are widely patent. There is a mild focal fusiform dilatation of the distal cavernous segment of  the right internal carotid artery. The right middle cerebral artery and the right anterior cerebral artery opacify into the capillary and venous phases. Cross filling via the anterior communicating artery of the left anterior cerebral artery distally is seen. The right middle cerebral artery bifurcation region again demonstrates the presence of the large wide neck saccular aneurysm projecting inferiorly and laterally. A 3D rotational arteriogram was then performed centered over the aneurysm. This again demonstrates the wide neck aneurysm with the superior and inferior divisions arising just proximal at the neck of the aneurysm. The DSA measurements of the aneurysm were approximately 7.63 mm x 7.26 vs 6.47 mm. ENDOVASCULAR TREATMENT OF THE RIGHT MIDDLE CEREBRAL ARTERY WIDE NECK RIGHT MIDDLE CEREBRAL ARTERY ANEURYSM The diagnostic JB 1 catheter in the right common carotid artery was then exchanged over a 0.035 inch 300 cm Rosen exchange guidewire for an 8 French short Pinnacle sheath in the right groin which was then connected to continuous heparinized saline infusion. Over the [facility] exchange guidewire, a 90 cm 8 [place] Neuron Max sheath was advanced initially in the right common carotid bifurcation.  The guidewire was removed. Good aspiration obtained from the hub of the Neuron Max sheath. Using biplane roadmap technique and constant fluoroscopic guidance, over a 0.035 inch Roadrunner guidewire, a 6 French 115 cm Sofia guide catheter was then advanced to the proximal cavernous segment of the right internal carotid artery. The guidewire was removed. Good aspiration obtained from the hub of the 6 Chad guide catheter. A control arteriogram performed at this site demonstrated no change in the extracranial or intracranial circulation of the right cerebral hemisphere. An 027 Via microcatheter was then advanced over a 0.014 inch standard Synchro micro guidewire with a J configuration to avoid dissections and inducing spasm to the supraclinoid right ICA. The 6 Chad guide catheter was advanced into the proximal cavernous segment of the right internal carotid artery. The micro guidewire was then gently advanced using a torque device, into the middle of the saccular aneurysm followed by the microcatheter. The wire was then gently retrieved under constant fluoroscopic guidance ensuring no movement of the microcatheter tip. Free aspiration was obtained from the hub of the microcatheter which was connected to continuous heparinized saline infusion. Initially, a 9 mm x 7 mm WEB device was chosen. This was prepped and purged of air immersed in a saline bath. This was then loaded onto the microcatheter using biplane roadmap technique and consult fluoroscopic guidance. Slow advancement of the device was performed to where the distal marker on the microcatheter was then lined with the distal marker on the device. With slight forward traction with the right hand on the delivery micro guidewire with the left hand the delivery microcatheter was gently and slowly unsheathed until a flat configuration of the device was formed. Thereafter, the combination of the microcatheter and the device combination was advanced  gently until the device was seen to form within the entirety of the aneurysm. However, on the lateral projection it was felt the device was slightly over sized. The device was then recaptured into the microcatheter with the microcatheter in the middle half of the aneurysm and retrieved and removed. A smaller device measuring 8 mm x 6 mm was again prepped and purged with heparinized saline infusion immersed in water. Thereafter, it was recaptured in the delivery microcatheter, was advanced in a coaxial manner and with constant heparinized saline infusion to the distal end of the intra-aneurysmal microcatheter. The entire system was then straightened  to relieve forward inertia. Thereafter, with the tip of the microcatheter in the mid aneurysm, and slight forward gentle traction with right hand on the delivery micro guidewire, with the left hand the microcatheter was retrieved gently unsheathing the device until 70% of the device had been unsheathed. With a flat configuration now obtained, the combination of the microcatheter and the device was slightly gently advanced forward until complete with the aneurysm had been achieved by the device, and stable configuration of the device was ascertained on the native floor images. Slight traction was applied to encourage the flat configuration formation at the neck of the wide neck aneurysm. Once achieved, the microcatheter was gently retrieved until the gap was achieved between the proximal markers on the device, and also the distal microcatheter. The separation of the microcatheter from the device was witnessed on fluoroscopy. The microcatheter and the delivery micro guidewire were then gently retrieved and removed. A control arteriogram was then performed immediately at 15 and 35 minutes post deployment. These continued to demonstrate excellent stasis within the entirety of the aneurysm with no change in the configuration device itself. The runs continued to demonstrate no  evidence of intraluminal filling defects within the superior and inferior divisions. The patient's ACT was maintained in the region of approximately 200 seconds. The Dyna CT the brain was undertaken in the usual manner with hand injection of 1-2% concentration of contrast. Reconstructed images demonstrated the stable configuration of the device, with no impingement or coverage of the origins of the superior or the inferior divisions. A final control arteriogram performed through the [place] guide catheter in the right internal carotid artery continued to demonstrate complete stasis of contrast within the web device into the delayed venous phases. No evidence of abnormal filling defects were seen in superior or the inferior division of the right middle cerebral artery, or of the anterior cerebral artery. The [place] guide catheter, and the Neuron Max sheath were retrieved and removed. The were right groin Pinnacle sheath was removed with the successful application of an 8 French Angio-Seal closure device. Distal pulses remained palpable in the dorsalis pedis, and the posterior tibial regions bilaterally. Patient was then extubated. Upon recovery, the patient denied any headaches, nausea, vomiting, visual or motor symptoms. No pronation drift of his outstretched hands. Patient was then transferred to the neuro ICU to continue on low-dose IV heparin, and close neurologic monitoring and control of blood pressure. Overnight the patient's stay was unremarkable. The patient was also evaluated by cardiology as per previous plan. The following day, the patient remained neurologically stable and intact. The right groin appeared soft. Patient was able to tolerate oral feeds without difficulty. The patient was then ambulated and assessed prior to discharge home following cardiology clearance with special instructions to continue on baby aspirin indefinitely, and to take 45 mg of Brilinta for 2 weeks. The patient  will be followed in the clinic in approximately 2 weeks post discharge. He was asked to maintain adequate hydration. He was also encouraged to steadily increase his level of activity as tolerated given his cardiac problems. 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 PLAN: Follow-up in the clinic 2 weeks post discharge. Electronically Signed   By: Luanne Bras M.D.   On: 11/23/2019 10:54    Labs:  CBC: Recent Labs    11/15/19 0513 11/16/19 0537 11/22/19 0616 11/23/19 0910  WBC 9.2 7.9 7.5 10.6*  HGB 16.4 15.7 15.4 14.2  HCT 52.9* 50.0 49.2 44.9  PLT 203 214 288 259    COAGS: Recent Labs    11/13/19 1012 11/22/19 0616  INR 1.1 1.1    BMP: Recent Labs    11/14/19 0615 11/16/19 0537 11/22/19 0616 11/23/19 0910  NA 136 137 137 135  K 3.8 4.4 4.4 4.7  CL 90* 95* 95* 99  CO2 32 31 27 25   GLUCOSE 119* 105* 113* 137*  BUN 26* 20 23 15   CALCIUM 9.7 9.3 9.6 9.1  CREATININE 1.12 0.97 1.04 0.81  GFRNONAA >60 >60 >60 >60  GFRAA >60 >60 >60 >60    LIVER FUNCTION TESTS: Recent Labs    11/05/19 1721 11/05/19 1721 11/05/19 2053 11/06/19 0312 11/08/19 2306 11/10/19 0505  BILITOT 1.5*  --  1.6* 1.3*  --   --   AST 30  --  34 21  --   --   ALT 23  --  19 21  --   --   ALKPHOS 63  --  58 60  --   --   PROT 6.0*  --  5.6* 5.6*  --   --   ALBUMIN 3.8   < > 3.5 3.5 3.8 3.6   < > = values in this interval not displayed.     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 Corliss Skains. Dr. Corliss Skains was present for consultation.  Discussed recent procedure. Explained that access site is stable and his bruising is normal progression of healing, and will subside in a few weeks. Explained the best course of management for his WEB device at this time is with a diagnostic cerebral arteriogram to accurately evaluate aneurysm. Plan for  follow-up with an image-guided diagnostic cerebral arteriogram 6 months from recent procedure 11/22/2019. Informed patient that our schedulers will call him to set up this appointment. Instructed patient to discontinue taking Aspirin 325 mg and begin taking Aspirin 81 mg once daily. Instructed patient to continue taking Eliquis 5 mg twice daily. Instructed patient to follow-up with his cardiologist regularly.  All questions answered and concerns addressed. Patient conveys understanding and agrees with plan.  Thank you for this interesting consult.  I greatly enjoyed meeting Richard Graham and look forward to participating in their care.  A copy of this report was sent to the requesting provider on this date.  Electronically Signed: Elwin Mocha, PA-C 12/12/2019, 8:49 AM   I spent a total of 40 Minutes in face to face in clinical consultation, greater than 50% of which was counseling/coordinating care for right MCA bifurcation aneurysm s/p embolization.

## 2019-12-12 NOTE — Telephone Encounter (Signed)
Spoke with the patient and advised them that he has been cleared for the MRI. He should be expecting a call to have it scheduled.

## 2019-12-12 NOTE — Addendum Note (Signed)
Addended by: Theresia Majors on: 12/12/2019 10:59 AM   Modules accepted: Orders

## 2019-12-13 ENCOUNTER — Telehealth: Payer: Self-pay | Admitting: Cardiology

## 2019-12-13 ENCOUNTER — Telehealth: Payer: Self-pay | Admitting: *Deleted

## 2019-12-13 NOTE — Telephone Encounter (Signed)
Spoke with the patient who had concerns about taking Entresto with potassium. He spoke with his pharmacist earlier today who advised them that his medications are okay to take together and that she commonly sees his regimen prescribed together. I advised the patient that we were monitoring his blood work and that we would be checking his potassium levels so if there were any issues then we would make adjustments accordingly. The patient verbalized understanding.

## 2019-12-13 NOTE — Telephone Encounter (Signed)
Spoke with patient regarding appointment for Cardiac MRI scheduled Tuesday 12/18/19 at 8:00 am---arrival time is 7:15 am 1st floor radiology.  Patient informed we have requested the prior authorization from his insurance for this testing.  I informed him I will let him know if there is an issue and if the prior authorization is not received prior to his appointment date---we will need to reschedule the study.

## 2019-12-13 NOTE — Telephone Encounter (Signed)
New Message    Pt c/o medication issue:  1. Name of Medication: sacubitril-valsartan (ENTRESTO) 24-26 MG  2. How are you currently taking this medication (dosage and times per day)? Started yesterday 1 tablet 2 x daily   3. Are you having a reaction (difficulty breathing--STAT)? No   4. What is your medication issue? Pt says he has some concerns on this medication after reading the warning sheet from the pharmacy  He thinks taking this with Potassium is a contradiction

## 2019-12-17 ENCOUNTER — Telehealth (HOSPITAL_COMMUNITY): Payer: Self-pay | Admitting: Emergency Medicine

## 2019-12-17 NOTE — Telephone Encounter (Signed)
Reaching out to patient to offer assistance regarding upcoming cardiac imaging study; pt verbalizes understanding of appt date/time, parking situation and where to check in, and verified current allergies; name and call back number provided for further questions should they arise Gale Klar RN Navigator Cardiac Imaging [facility] [phone] office [phone] cell 

## 2019-12-17 NOTE — Telephone Encounter (Deleted)
Attempted to call patient regarding upcoming cardiac CT appointment. °Left message on voicemail with name and callback number °Dontarius Sheley RN Navigator Cardiac Imaging °[facility] °[phone] Office °[phone] Cell ° °

## 2019-12-18 ENCOUNTER — Other Ambulatory Visit: Payer: Self-pay

## 2019-12-18 ENCOUNTER — Ambulatory Visit (HOSPITAL_COMMUNITY)
Admission: RE | Admit: 2019-12-18 | Discharge: 2019-12-18 | Disposition: A | Discharge status: Home / Self Care | Payer: Commercial Managed Care - PPO | Source: Ambulatory Visit | Attending: Cardiology | Admitting: Cardiology

## 2019-12-18 DIAGNOSIS — I2583 Coronary atherosclerosis due to lipid rich plaque: Secondary | ICD-10-CM | POA: Insufficient documentation

## 2019-12-18 DIAGNOSIS — E785 Hyperlipidemia, unspecified: Secondary | ICD-10-CM | POA: Diagnosis present

## 2019-12-18 DIAGNOSIS — I2510 Atherosclerotic heart disease of native coronary artery without angina pectoris: Secondary | ICD-10-CM | POA: Diagnosis present

## 2019-12-18 DIAGNOSIS — I671 Cerebral aneurysm, nonruptured: Secondary | ICD-10-CM | POA: Diagnosis present

## 2019-12-18 DIAGNOSIS — I4891 Unspecified atrial fibrillation: Secondary | ICD-10-CM | POA: Diagnosis not present

## 2019-12-18 DIAGNOSIS — I5022 Chronic systolic (congestive) heart failure: Secondary | ICD-10-CM

## 2019-12-18 IMAGING — MR MR CARD MORPHOLOGY WO/W CM
41 of 43 series · 41 of 43 positions shown · IV contrast (gadavist)
Comparison: none

CLINICAL DATA: 63-year-old male with ischemic cardiomyopathy with
mid LAD occlusion. Evaluate for LAD viability.

EXAM:
CARDIAC MRI
TECHNIQUE: The patient was scanned on a 1.5 Tesla GE magnet. A dedicated
cardiac coil was used. Functional imaging was done using Fiesta
sequences. [DATE], and 4 chamber views were done to assess for RWMA's.
Modified HAMAYAK rule using a short axis stack was used to
calculate an ejection fraction on a dedicated work station using
Circle software. The patient received 10 cc of Gadavist. After 10
minutes inversion recovery sequences were used to assess for
infiltration and scar tissue.
CONTRAST:  10 cc  of Gadavist

[Series 6: ax haste · axial · 5.0mm · 1.19mm/px · 1 of 36 slices shown]
[im 1/36]
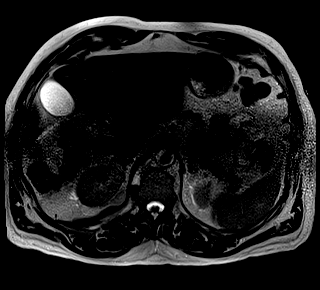

[Series 7: bSSFP · oblique · 8.0mm · 1.61mm/px · 1 of 25 slices shown (1 of 22)]
[im 1/25]
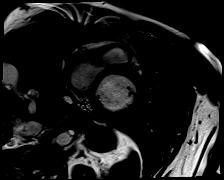

[Series 7: bSSFP · oblique · 8.0mm · 1.61mm/px · 1 of 25 slices shown (2 of 22)]
[im 1/25]
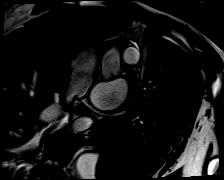

[Series 7: bSSFP · oblique · 8.0mm · 1.61mm/px · 1 of 25 slices shown (3 of 22)]
[im 1/25]
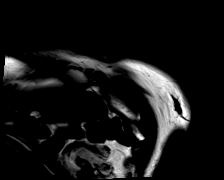

[Series 7: bSSFP · oblique · 8.0mm · 1.61mm/px · 1 of 25 slices shown (4 of 22)]
[im 1/25]
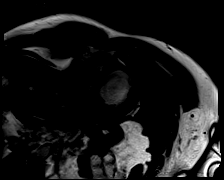

[Series 7: bSSFP · oblique · 8.0mm · 1.61mm/px · 1 of 25 slices shown (5 of 22)]
[im 1/25]
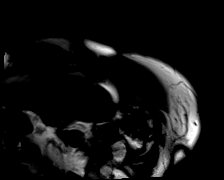

[Series 7: bSSFP · oblique · 8.0mm · 1.61mm/px · 1 of 25 slices shown (6 of 22)]
[im 1/25]
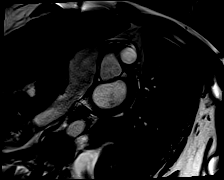

[Series 7: bSSFP · oblique · 8.0mm · 1.61mm/px · 1 of 25 slices shown (7 of 22)]
[im 1/25]
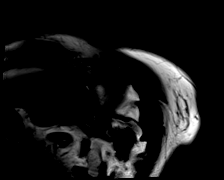

[Series 7: bSSFP · oblique · 8.0mm · 1.61mm/px · 1 of 25 slices shown (8 of 22)]
[im 1/25]
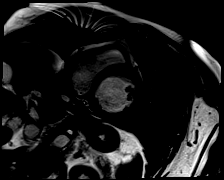

[Series 7: bSSFP · oblique · 8.0mm · 1.61mm/px · 1 of 25 slices shown (9 of 22)]
[im 1/25]
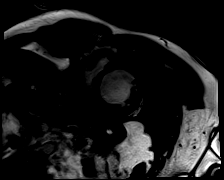

[Series 7: bSSFP · oblique · 8.0mm · 1.61mm/px · 1 of 25 slices shown (10 of 22)]
[im 1/25]
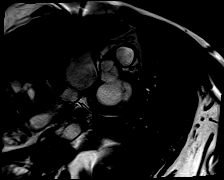

[Series 7: bSSFP · oblique · 8.0mm · 1.61mm/px · 1 of 25 slices shown (11 of 22)]
[im 1/25]
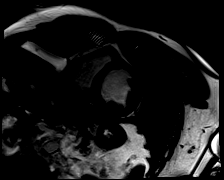

[Series 7: bSSFP · oblique · 8.0mm · 1.61mm/px · 1 of 25 slices shown (12 of 22)]
[im 1/25]
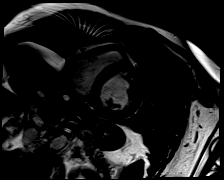

[Series 7: bSSFP · oblique · 8.0mm · 1.61mm/px · 1 of 25 slices shown (13 of 22)]
[im 1/25]
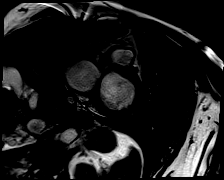

[Series 7: bSSFP · oblique · 8.0mm · 1.61mm/px · 1 of 25 slices shown (14 of 22)]
[im 1/25]
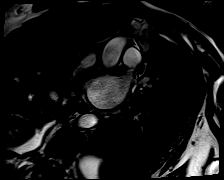

[Series 7: bSSFP · oblique · 8.0mm · 1.61mm/px · 1 of 25 slices shown (15 of 22)]
[im 1/25]
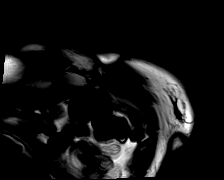

[Series 7: bSSFP · oblique · 8.0mm · 1.61mm/px · 1 of 25 slices shown (16 of 22)]
[im 1/25]
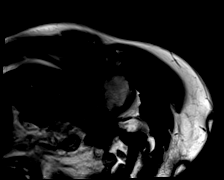

[Series 7: bSSFP · oblique · 8.0mm · 1.61mm/px · 1 of 25 slices shown (17 of 22)]
[im 1/25]
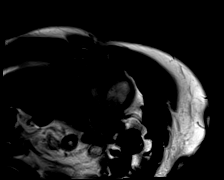

[Series 7: bSSFP · oblique · 8.0mm · 1.61mm/px · 1 of 25 slices shown (18 of 22)]
[im 1/25]
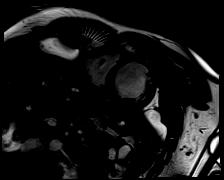

[Series 7: bSSFP · oblique · 8.0mm · 1.61mm/px · 1 of 25 slices shown (19 of 22)]
[im 1/25]
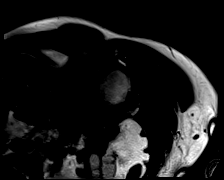

[Series 11: (id)_long_t1 · coronal · 8.0mm · 1.41mm/px · 1 of 24 slices shown]
[im 1/24]
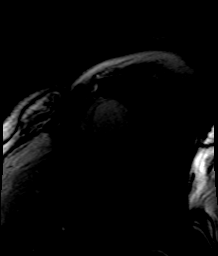

[Series 12: (id)_long_t1_moco · coronal · 8.0mm · 1.41mm/px · 1 of 24 slices shown]
[im 1/24]
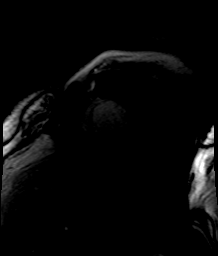

[Series 13: (id)_long_t1_moco_t1 · coronal · 8.0mm · 1.41mm/px · 1 of 6 slices shown]
[im 1/6]
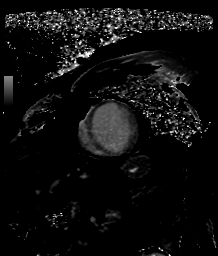

[Series 15: bSSFP · axial · 6.0mm · 1.41mm/px · 1 of 25 slices shown (20 of 22)]
[im 1/25]
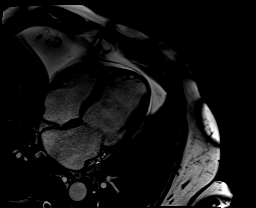

[Series 16: bSSFP · sagittal · 6.0mm · 1.41mm/px · 1 of 25 slices shown (21 of 22)]
[im 1/25]
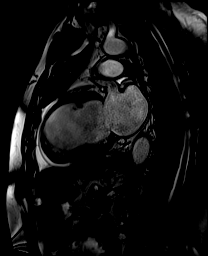

[Series 17: bSSFP · oblique · 6.0mm · 1.41mm/px · 1 of 25 slices shown (22 of 22)]
[im 1/25]
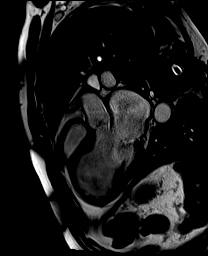

[Series 18: (id)_trufi · coronal · 8.0mm · 1.88mm/px · 1 of 9 slices shown]
[im 1/9]
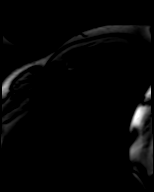

[Series 19: (id)_trufi_moco · coronal · 8.0mm · 1.88mm/px · 1 of 9 slices shown]
[im 1/9]
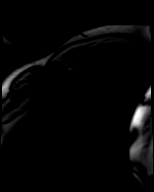

[Series 20: (id)_trufi_moco_t2 · coronal · 8.0mm · 1.88mm/px · 1 of 3 slices shown]
[im 1/3]
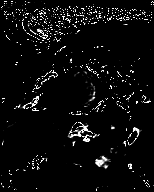

[Series 22: t2_stir_db_radial ((date)ch) · axial · 6.0mm · 1.73mm/px · 1 of 3 slices shown]
[im 1/3]
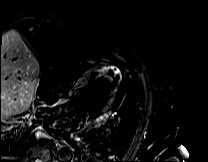

[Series 28: lge_single shot sa · oblique · 8.0mm · 1.98mm/px · 1 of 19 slices shown (1 of 2)]
[im 1/19]
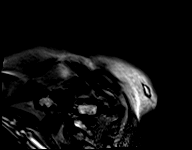

[Series 29: lge_single shot sa · oblique · 8.0mm · 1.98mm/px · 1 of 19 slices shown (2 of 2)]
[im 1/19]
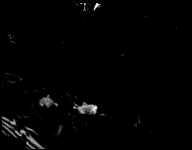

[Series 30: lge_single shot radial_mag · axial · 6.0mm · 2.08mm/px · 1 of 1 slices shown]
[im 1/1]
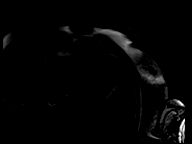

[Series 31: lge_single shot radial_psir · axial · 6.0mm · 2.08mm/px · 1 of 1 slices shown]
[im 1/1]
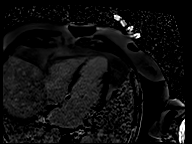

[Series 36: (id)_short_t1 · coronal · 8.0mm · 1.41mm/px · 1 of 27 slices shown]
[im 1/27]
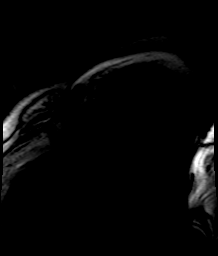

[Series 37: (id)_short_t1_moco · coronal · 8.0mm · 1.41mm/px · 1 of 27 slices shown]
[im 1/27]
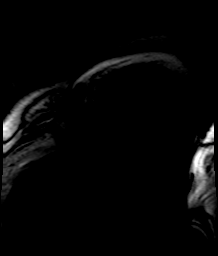

[Series 38: (id)_short_t1_moco_t1 · coronal · 8.0mm · 1.41mm/px · 1 of 6 slices shown]
[im 1/6]
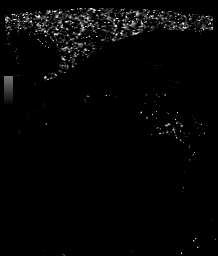

[Series 43: lge short axis_mag · oblique · 8.0mm · 1.70mm/px · 1 of 19 slices shown]
[im 1/19]
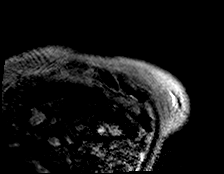

[Series 44: lge short axis_psir · oblique · 8.0mm · 1.70mm/px · 1 of 19 slices shown]
[im 1/19]
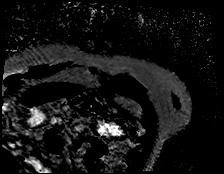

[Series 45: lge radial ((date)ch)_mag · axial · 6.0mm · 1.61mm/px · 1 of 1 slices shown]
[im 1/1]
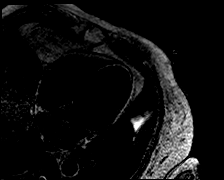

[Series 46: lge radial ((date)ch)_psir · axial · 6.0mm · 1.61mm/px · 1 of 1 slices shown]
[im 1/1]
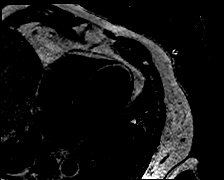

[41 of 43 positions shown; findings below may reference images not displayed]

FINDINGS: 1. Severely dilated left ventricle with mild concentric hypertrophy
and severely impaired systolic function (LVEF = 22%). There is
apical aneurysm involving all of the apical segments and mid
anterior wall. There is paradoxical septal motion. Late gadolinium
enhancement is seen in the mid anteroseptal wall (25-50%
transmurality with good chance of recovery), mid anterior, apical
septal, anterior, inferior walls and in the true apex (75-100%
transmurality with poor chance of recovery), apical lateral wall
(25-50%).

LVEDD: 69 mm

LVESD: 59 mm

LVEDV: 266 ml

LVESV: 210 ml

SV: 56 ml

CO: 4.6 L/min

Myocardial mass: 164 g

2. Normal right ventricular size, thickness and moderately decreased
systolic function (LVEF = 31%). There are no regional wall motion
abnormalities.

3. Severely dilated left atrium (54 mm) and mildly dilated right
atrium.

4. Normal size of the aortic root, ascending aorta and upper normal
size of the pulmonary artery (30 mm).

5.  Mild mitral and tricuspid regurgitation.

6. Normal pericardium.  Trivial pericardial effusion.
IMPRESSION: 1. Severely dilated left ventricle with mild concentric hypertrophy
and severely impaired systolic function (LVEF = 22%). There is
apical aneurysm involving all of the apical segments and mid
anterior wall. There is paradoxical septal motion. Late gadolinium
enhancement is seen in the mid anteroseptal wall (25-50%
transmurality with good chance of recovery), mid anterior, apical
septal, anterior, inferior walls and in the true apex (75-100%
transmurality with poor chance of recovery), apical lateral wall
(25-50%).

2. Normal right ventricular size, thickness and moderately decreased
systolic function (LVEF = 31%). There are no regional wall motion
abnormalities.

3. Severely dilated left atrium (54 mm) and mildly dilated right
atrium.

4. Mild mitral and tricuspid regurgitation.

These findings are consistent with ischemic cardiomyopathy with
severe LV systolic dysfunction and no viability in most of the
segments supplied by mid and apical LAD (mid anterior and apical
septal, anterior, interior walls and in the true apex - total of 5
segments).

## 2019-12-18 MED ORDER — GADOBUTROL 1 MMOL/ML IV SOLN
10.0000 mL | Freq: Once | INTRAVENOUS | Status: AC | PRN
  Administered 2019-12-18: 10 mL via INTRAVENOUS

## 2019-12-20 ENCOUNTER — Encounter (HOSPITAL_COMMUNITY): Payer: Self-pay | Admitting: Nurse Practitioner

## 2019-12-20 ENCOUNTER — Telehealth: Payer: Self-pay

## 2019-12-20 ENCOUNTER — Other Ambulatory Visit: Payer: Commercial Managed Care - PPO

## 2019-12-20 ENCOUNTER — Other Ambulatory Visit: Payer: Self-pay

## 2019-12-20 ENCOUNTER — Ambulatory Visit (HOSPITAL_COMMUNITY)
Admission: RE | Admit: 2019-12-20 | Discharge: 2019-12-20 | Disposition: A | Discharge status: Home / Self Care | Payer: Commercial Managed Care - PPO | Source: Ambulatory Visit | Attending: Nurse Practitioner | Admitting: Nurse Practitioner

## 2019-12-20 VITALS — BP 140/80 | HR 93 | Ht 73.0 in | Wt 243.4 lb

## 2019-12-20 DIAGNOSIS — M109 Gout, unspecified: Secondary | ICD-10-CM | POA: Diagnosis not present

## 2019-12-20 DIAGNOSIS — R7303 Prediabetes: Secondary | ICD-10-CM | POA: Insufficient documentation

## 2019-12-20 DIAGNOSIS — I4891 Unspecified atrial fibrillation: Secondary | ICD-10-CM | POA: Diagnosis not present

## 2019-12-20 DIAGNOSIS — Z7901 Long term (current) use of anticoagulants: Secondary | ICD-10-CM | POA: Diagnosis not present

## 2019-12-20 DIAGNOSIS — I5022 Chronic systolic (congestive) heart failure: Secondary | ICD-10-CM | POA: Diagnosis not present

## 2019-12-20 DIAGNOSIS — I671 Cerebral aneurysm, nonruptured: Secondary | ICD-10-CM | POA: Diagnosis not present

## 2019-12-20 DIAGNOSIS — Z87891 Personal history of nicotine dependence: Secondary | ICD-10-CM | POA: Insufficient documentation

## 2019-12-20 DIAGNOSIS — Z79899 Other long term (current) drug therapy: Secondary | ICD-10-CM | POA: Insufficient documentation

## 2019-12-20 DIAGNOSIS — I2510 Atherosclerotic heart disease of native coronary artery without angina pectoris: Secondary | ICD-10-CM | POA: Insufficient documentation

## 2019-12-20 DIAGNOSIS — D6869 Other thrombophilia: Secondary | ICD-10-CM | POA: Diagnosis not present

## 2019-12-20 LAB — CBC
HCT: 48.7 % (ref 39.0–52.0)
Hemoglobin: 14.9 g/dL (ref 13.0–17.0)
MCH: 29.3 pg (ref 26.0–34.0)
MCHC: 30.6 g/dL (ref 30.0–36.0)
MCV: 95.7 fL (ref 80.0–100.0)
Platelets: 182 10*3/uL (ref 150–400)
RBC: 5.09 MIL/uL (ref 4.22–5.81)
RDW: 15.3 % (ref 11.5–15.5)
WBC: 5.7 10*3/uL (ref 4.0–10.5)
nRBC: 0 % (ref 0.0–0.2)

## 2019-12-20 LAB — BASIC METABOLIC PANEL
Anion gap: 10 (ref 5–15)
BUN: 15 mg/dL (ref 8–23)
CO2: 25 mmol/L (ref 22–32)
Calcium: 9.1 mg/dL (ref 8.9–10.3)
Chloride: 101 mmol/L (ref 98–111)
Creatinine, Ser: 1.01 mg/dL (ref 0.61–1.24)
GFR calc Af Amer: >60 mL/min (ref >60)
GFR calc non Af Amer: >60 mL/min (ref >60)
Glucose, Bld: 118 mg/dL — ABNORMAL HIGH (ref 70–99)
Potassium: 5.3 mmol/L — ABNORMAL HIGH (ref 3.5–5.1)
Sodium: 136 mmol/L (ref 135–145)

## 2019-12-20 MED ORDER — APIXABAN 5 MG PO TABS: 5.0000 mg | ORAL_TABLET | Freq: Two times a day (BID) | ORAL | 5 refills | Status: DC

## 2019-12-20 NOTE — Progress Notes (Signed)
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 male with a h/o atrial fibrillation,systolic heart failure, cerebral aneurysm, CAD (CTO of LAD)and prediabetes.  He was initially seen 11/05/2019 when he presented to ER with significant weight gain, lower extremity edema, orthopnea and dyspnea on exertion. He was found to be in new onset atrial fibrillation with RVR, and new systolic heart failure with an EF of 20 to 25%. Underwent cardiac catheterization with chronically occluded mid LAD with left to left collaterals. Plan was for medical therapy but at some point could consider viability evaluation with possible CTO intervention. An incidental finding of a cerebral aneurysm was found during this admission. Stroke was ruled out, IR was consulted and Dr. Rebeca Alert to perform angiography with possible embolization within a week and therefore not placed on oral anticoagulation. He went on to have that procedure done 4/8 and  was stated on anticoagulation 4/9, eliquis 5 mg bid with a CHA2DS2VASc score of 2(chf, pre DM), in anticipation of cardioversion.   Pt is now in the afib clinic for scheduling of cardioversion. He notes that he has not missed any doses of eliquis.   He was laoded on amiodarone at time of hospitalization in March and is now on 200 mg daily. I would recommend continuing  on amiodarone for 3-6 months after cardioversion and then  repeating echo in this interim. If shows improvement in left atrial size, could refer for ablation. His last echo reported out mild dilation of left atrium, but revealed a left atrial size of 5.3 cm and volume of 62,  which indicates to me severely dilated. This would have to improve to make him a good candidate for ablation.   He has one cup of coffee  a day, 1-2 glasses of wine per night and reports witnessed  snoring .  Today, he denies symptoms of palpitations, chest pain, shortness  of breath, orthopnea, PND, lower extremity edema, dizziness, presyncope, syncope, or neurologic sequela. The patient is tolerating medications without difficulties and is otherwise without complaint today.   Past Medical History:  Diagnosis Date  . CHF (congestive heart failure) (HCC)   . Gout   . New onset atrial fibrillation (HCC)   . Pre-diabetes    diet controlled, no meds   Past Surgical History:  Procedure Laterality Date  . COLONOSCOPY    . INSERTION OF MESH N/A 06/27/2015   Procedure: INSERTION OF MESH;  Surgeon: Axel Filler, MD;  Location: [facility];  Service: General;  Laterality: N/A;  . IR 3D INDEPENDENT [facility]KST  11/14/2019  . IR 3D INDEPENDENT [facility]KST  11/22/2019  . IR ANGIO INTRA EXTRACRAN SEL COM CAROTID INNOMINATE BILAT MOD SED  11/14/2019  . IR ANGIO INTRA EXTRACRAN SEL INTERNAL CAROTID UNI R MOD SED  11/22/2019  . IR ANGIO VERTEBRAL SEL VERTEBRAL BILAT MOD SED  11/14/2019  . IR ANGIOGRAM FOLLOW UP STUDY  11/22/2019  . IR CT HEAD LTD  11/22/2019  . IR NEURO EACH ADD'L AFTER BASIC UNI RIGHT (MS)  11/22/2019  . IR TRANSCATH/EMBOLIZ  11/22/2019  . laceration to fingers     left hand  . MOUTH SURGERY     secondary to abscess   . RADIOLOGY WITH ANESTHESIA N/A 11/22/2019   Procedure: EMBOLIZATION;  Surgeon: Julieanne Cotton, MD;  Location: [facility];  Service: Radiology;  Laterality: N/A;  . RIGHT/LEFT HEART CATH AND CORONARY ANGIOGRAPHY N/A 11/12/2019   Procedure: RIGHT/LEFT HEART CATH  AND CORONARY ANGIOGRAPHY;  Surgeon: Corky Crafts, MD;  Location: [facility];  Service: Cardiovascular;  Laterality: N/A;  . trauma to lip      sutured  . VENTRAL HERNIA REPAIR N/A 06/27/2015   Procedure: LAPAROSCOPIC VENTRAL HERNIA;  Surgeon: Axel Filler, MD;  Location: [facility];  Service: General;  Laterality: N/A;    Current Outpatient Medications  Medication Sig Dispense Refill  . amiodarone (PACERONE) 200 MG tablet Take 1 tablet (200 mg total) by mouth daily. 30 tablet 1  . colchicine  0.6 MG tablet Take 0.6 mg by mouth 2 (two) times daily as needed (for gout flares).     . diclofenac sodium (VOLTAREN) 1 % GEL Apply 2 g topically 2 (two) times daily as needed (to affected sites- for arthritis pain).     . furosemide (LASIX) 40 MG tablet Take 1 tablet (40 mg total) by mouth daily. 90 tablet 3  . metoprolol tartrate (LOPRESSOR) 50 MG tablet Take 1 tablet (50 mg total) by mouth 2 (two) times daily. 60 tablet 3  . oxyCODONE-acetaminophen (PERCOCET) 10-325 MG tablet Take 1 tablet by mouth every 4 (four) hours as needed for pain. 30 tablet 0  . potassium chloride SA (KLOR-CON) 20 MEQ tablet Take 1 tablet (20 mEq total) by mouth daily. 30 tablet 3  . rosuvastatin (CRESTOR) 5 MG tablet Take 1 tablet (5 mg total) by mouth daily. 30 tablet 3  . sacubitril-valsartan (ENTRESTO) 24-26 MG Take 1 tablet by mouth 2 (two) times daily. 45 tablet 1  . spironolactone (ALDACTONE) 25 MG tablet Take 0.5 tablets (12.5 mg total) by mouth daily. 45 tablet 3  . temazepam (RESTORIL) 30 MG capsule Take 1 capsule (30 mg total) by mouth at bedtime as needed for sleep. (Patient taking differently: Take 30 mg by mouth at bedtime. ) 5 capsule 0  . apixaban (ELIQUIS) 5 MG TABS tablet Take 1 tablet (5 mg total) by mouth 2 (two) times daily. 60 tablet 5   No current facility-administered medications for this encounter.    No Known Allergies  Social History   Socioeconomic History  . Marital status: Married    Spouse name: Not on file  . Number of children: Not on file  . Years of education: Not on file  . Highest education level: Not on file  Occupational History  . Not on file  Tobacco Use  . Smoking status: Former Smoker    Packs/day: 0.25    Years: 3.00    Pack years: 0.75    Types: Cigarettes    Quit date: 08/17/2011    Years since quitting: 8.3  . Smokeless tobacco: Never Used  Substance and Sexual Activity  . Alcohol use: Yes    Alcohol/week: 1.0 standard drinks    Types: 1 Glasses of wine  per week    Comment: occasional wine  . Drug use: No  . Sexual activity: Yes  Other Topics Concern  . Not on file  Social History Narrative  . Not on file   Social Determinants of Health   Financial Resource Strain:   . Difficulty of Paying Living Expenses:   Food Insecurity:   . Worried About Programme researcher, broadcasting/film/video in the Last Year:   . Barista in the Last Year:   Transportation Needs:   . Freight forwarder (Medical):   Marland Kitchen Lack of Transportation (Non-Medical):   Physical Activity:   . Days of Exercise per Week:   . Minutes  of Exercise per Session:   Stress:   . Feeling of Stress :   Social Connections:   . Frequency of Communication with Friends and Family:   . Frequency of Social Gatherings with Friends and Family:   . Attends Religious Services:   . Active Member of Clubs or Organizations:   . Attends Banker Meetings:   Marland Kitchen Marital Status:   Intimate Partner Violence:   . Fear of Current or Ex-Partner:   . Emotionally Abused:   Marland Kitchen Physically Abused:   . Sexually Abused:     No family history on file.  ROS- All systems are reviewed and negative except as per the HPI above  Physical Exam: Vitals:   12/20/19 1002  BP: 140/80  Pulse: 93  Weight: 110.4 kg  Height: 6\' 1"  (1.854 m)   Wt Readings from Last 3 Encounters:  12/20/19 110.4 kg  12/11/19 108.4 kg  11/22/19 102.1 kg    Labs: Lab Results  Component Value Date   NA 136 12/20/2019   K 5.3 (H) 12/20/2019   CL 101 12/20/2019   CO2 25 12/20/2019   GLUCOSE 118 (H) 12/20/2019   BUN 15 12/20/2019   CREATININE 1.01 12/20/2019   CALCIUM 9.1 12/20/2019   PHOS 3.8 11/10/2019   MG 2.3 11/14/2019   Lab Results  Component Value Date   INR 1.1 11/22/2019   Lab Results  Component Value Date   CHOL 103 11/07/2019   HDL 40 (L) 11/07/2019   LDLCALC 56 11/07/2019   TRIG 36 11/07/2019     GEN- The patient is well appearing, alert and oriented x 3 today.   Head- normocephalic,  atraumatic Eyes-  Sclera clear, conjunctiva pink Ears- hearing intact Oropharynx- clear Neck- supple, no JVP Lymph- no cervical lymphadenopathy Lungs- Clear to ausculation bilaterally, normal work of breathing Heart- irregular rate and rhythm, no murmurs, rubs or gallops, PMI not laterally displaced GI- soft, NT, ND, + BS Extremities- no clubbing, cyanosis, or edema MS- no significant deformity or atrophy Skin- no rash or lesion Psych- euthymic mood, full affect Neuro- strength and sensation are intact  EKG- afib at 93 bpm, RBBB, LAFB,at 93 bpm, qrs 104 ms, qtc 502 ms  Echo- LV function is severely reduced, 20-25%. The mid anteroseptum into  apex is akinetic, almost aneurysmal, consistent with prior LAD infarction  (Q waves on EKG). The myocardium is thinned, futher supporting prior  infarction. On contrast imaging, there  is no LV thrombus seen.  2. Left ventricular ejection fraction, by estimation, is 20 to 25%. The  left ventricle has severely decreased function. The left ventricle  demonstrates regional wall motion abnormalities (see scoring  diagram/findings for description). There is mild  concentric left ventricular hypertrophy. Left ventricular diastolic  function could not be evaluated.  3. Right ventricular systolic function is mildly reduced. The right  ventricular size is normal. There is mildly elevated pulmonary artery  systolic pressure. The estimated right ventricular systolic pressure is  42.2 mmHg.  4. Left atrial size was mildly dilated.  5. The mitral valve is grossly normal. Trivial mitral valve  regurgitation. No evidence of mitral stenosis.  6. The aortic valve is tricuspid. Aortic valve regurgitation is not  visualized. No aortic stenosis is present.    Assessment and Plan: 1. New onset  afib  March 2021 Loaded on amiodarone at that time Now on 200 mg qd Continue metoprolol tartrate 50 mg bid  Will plan for cardioversion I advise keeping on  amiodarone 3-6 months and repeating echo at 3 months to see if improvement in left atrial size and if so could be referred for ablation and amio stopped after ablation If no improvement in left atrial size, can try the 3 month washout and transition to Tikosyn Referred for sleep study  Advised no more than 2 glasses of wine a week  Bmet/cbc today   2. CHA2DS2VASc score of 2 Continue eliquis 5 mg bid  Anticoagulation had  to be delayed until cerebral aneurysm could be embolized  He has now been on anticoagulation since 4/9 without missed doses Will plan for cardioversion, no missed doses Risk vrs benefit explained and pt wants to proceed   I will see back one week after cardioversion  Butch Penny C. Luken Shadowens, [facility] [address] [place], [place] [address] [phone]

## 2019-12-20 NOTE — Telephone Encounter (Signed)
Pt calling requesting a refill on Eliquis and pt also wanted a call back at [phone], concerning this matter. Please address

## 2019-12-20 NOTE — Telephone Encounter (Signed)
Prescription refill request for Eliquis received.  Last office visit: Richard Graham 12/20/2019, Richard Graham 12/11/2019 Scr: 1.01, 12/20/2019 Age: 64 y.o. Weight: 110.4 kg   Prescription refill sent.

## 2019-12-20 NOTE — Telephone Encounter (Signed)
-----   Message from Quintella Reichert, MD sent at 12/20/2019  2:36 PM EDT ----- Discussed the results of the cardiac MRI with patient.  There is no significant viability in the anterior and apical walls from chronically occluded LAD.  No indication for PCI of the LAD. He has an apical aneurysm as well.  He is already anticoagulated for his afib.  Please set up EP eval with Dr. Ladona Ridgel for AICD.

## 2019-12-20 NOTE — Patient Instructions (Signed)
Cardioversion scheduled for Friday, May 14th  - Arrive at the [facility] and go to admitting at 9:30AM  -Do not eat or drink anything after midnight the night prior to your procedure.  - Take all your morning medication with a sip of water prior to arrival.  - You will not be able to drive home after your procedure.

## 2019-12-20 NOTE — Telephone Encounter (Signed)
Referral to EP has been placed.

## 2019-12-25 ENCOUNTER — Other Ambulatory Visit (HOSPITAL_COMMUNITY)
Admission: RE | Admit: 2019-12-25 | Discharge: 2019-12-25 | Disposition: A | Discharge status: Home / Self Care | Payer: Commercial Managed Care - PPO | Source: Ambulatory Visit | Attending: Cardiovascular Disease | Admitting: Cardiovascular Disease

## 2019-12-25 DIAGNOSIS — Z01812 Encounter for preprocedural laboratory examination: Secondary | ICD-10-CM | POA: Diagnosis not present

## 2019-12-25 DIAGNOSIS — Z20822 Contact with and (suspected) exposure to covid-19: Secondary | ICD-10-CM | POA: Diagnosis not present

## 2019-12-25 LAB — SARS CORONAVIRUS 2 (TAT 6-24 HRS): SARS Coronavirus 2: NEGATIVE

## 2019-12-26 ENCOUNTER — Encounter: Payer: Self-pay | Admitting: Internal Medicine

## 2019-12-26 ENCOUNTER — Other Ambulatory Visit: Payer: Self-pay

## 2019-12-26 ENCOUNTER — Ambulatory Visit (INDEPENDENT_AMBULATORY_CARE_PROVIDER_SITE_OTHER): Payer: Commercial Managed Care - PPO | Admitting: Internal Medicine

## 2019-12-26 DIAGNOSIS — I5022 Chronic systolic (congestive) heart failure: Secondary | ICD-10-CM | POA: Insufficient documentation

## 2019-12-26 NOTE — Progress Notes (Signed)
HPI Mr. Dorsey is referred today by Dr. Mayford Knife for consideration of ICD insertion. The patient is a pleasant 64 yo man with persistent atrial fib, RBBB, chronic systolic heart failure, s/p anterior MI with an occluded LAD. He has had a middle cerebral artery aneurysm, s/p clipping. He has not had syncope. He has class 2 CHF symptoms and has been on maximal medical therapy. He has been treated with amiodarone for his atrial fib. He has not been vaccinated for Covid.  No Known Allergies   Current Outpatient Medications  Medication Sig Dispense Refill  . amiodarone (PACERONE) 200 MG tablet Take 1 tablet (200 mg total) by mouth daily. 30 tablet 1  . apixaban (ELIQUIS) 5 MG TABS tablet Take 1 tablet (5 mg total) by mouth 2 (two) times daily. 60 tablet 5  . aspirin EC 81 MG tablet Take 81 mg by mouth daily.    . colchicine 0.6 MG tablet Take 0.6 mg by mouth 2 (two) times daily as needed (for gout flares).     . diclofenac sodium (VOLTAREN) 1 % GEL Apply 2 g topically 2 (two) times daily as needed (to affected sites- for arthritis pain).     . furosemide (LASIX) 40 MG tablet Take 1 tablet (40 mg total) by mouth daily. 90 tablet 3  . HYDROcodone-acetaminophen (NORCO) 10-325 MG tablet Take 1 tablet by mouth 2 (two) times daily as needed for moderate pain.    . metoprolol tartrate (LOPRESSOR) 50 MG tablet Take 1 tablet (50 mg total) by mouth 2 (two) times daily. 60 tablet 3  . potassium chloride SA (KLOR-CON) 20 MEQ tablet Take 1 tablet (20 mEq total) by mouth daily. 30 tablet 3  . rosuvastatin (CRESTOR) 5 MG tablet Take 1 tablet (5 mg total) by mouth daily. 30 tablet 3  . sacubitril-valsartan (ENTRESTO) 24-26 MG Take 1 tablet by mouth 2 (two) times daily. 45 tablet 1  . spironolactone (ALDACTONE) 25 MG tablet Take 0.5 tablets (12.5 mg total) by mouth daily. 45 tablet 3   No current facility-administered medications for this visit.     Past Medical History:  Diagnosis Date  . CHF  (congestive heart failure) (HCC)   . Gout   . New onset atrial fibrillation (HCC)   . Pre-diabetes    diet controlled, no meds    ROS:   All systems reviewed and negative except as noted in the HPI.   Past Surgical History:  Procedure Laterality Date  . COLONOSCOPY    . INSERTION OF MESH N/A 06/27/2015   Procedure: INSERTION OF MESH;  Surgeon: Axel Filler, MD;  Location: [facility];  Service: General;  Laterality: N/A;  . IR 3D INDEPENDENT [facility]KST  11/14/2019  . IR 3D INDEPENDENT [facility]KST  11/22/2019  . IR ANGIO INTRA EXTRACRAN SEL COM CAROTID INNOMINATE BILAT MOD SED  11/14/2019  . IR ANGIO INTRA EXTRACRAN SEL INTERNAL CAROTID UNI R MOD SED  11/22/2019  . IR ANGIO VERTEBRAL SEL VERTEBRAL BILAT MOD SED  11/14/2019  . IR ANGIOGRAM FOLLOW UP STUDY  11/22/2019  . IR CT HEAD LTD  11/22/2019  . IR NEURO EACH ADD'L AFTER BASIC UNI RIGHT (MS)  11/22/2019  . IR TRANSCATH/EMBOLIZ  11/22/2019  . laceration to fingers     left hand  . MOUTH SURGERY     secondary to abscess   . RADIOLOGY WITH ANESTHESIA N/A 11/22/2019   Procedure: EMBOLIZATION;  Surgeon: Julieanne Cotton, MD;  Location: [facility];  Service: Radiology;  Laterality: N/A;  . RIGHT/LEFT HEART CATH AND CORONARY ANGIOGRAPHY N/A 11/12/2019   Procedure: RIGHT/LEFT HEART CATH AND CORONARY ANGIOGRAPHY;  Surgeon: Jettie Booze, MD;  Location: [facility];  Service: Cardiovascular;  Laterality: N/A;  . trauma to lip      sutured  . VENTRAL HERNIA REPAIR N/A 06/27/2015   Procedure: LAPAROSCOPIC VENTRAL HERNIA;  Surgeon: Ralene Ok, MD;  Location: [facility];  Service: General;  Laterality: N/A;     No family history on file.   Social History   Socioeconomic History  . Marital status: Married    Spouse name: Not on file  . Number of children: Not on file  . Years of education: Not on file  . Highest education level: Not on file  Occupational History  . Not on file  Tobacco Use  . Smoking status: Former Smoker    Packs/day: 0.25     Years: 3.00    Pack years: 0.75    Types: Cigarettes    Quit date: 08/17/2011    Years since quitting: 8.3  . Smokeless tobacco: Never Used  Substance and Sexual Activity  . Alcohol use: Yes    Alcohol/week: 1.0 standard drinks    Types: 1 Glasses of wine per week    Comment: occasional wine  . Drug use: No  . Sexual activity: Yes  Other Topics Concern  . Not on file  Social History Narrative  . Not on file   Social Determinants of Health   Financial Resource Strain:   . Difficulty of Paying Living Expenses:   Food Insecurity:   . Worried About Charity fundraiser in the Last Year:   . Arboriculturist in the Last Year:   Transportation Needs:   . Film/video editor (Medical):   Marland Kitchen Lack of Transportation (Non-Medical):   Physical Activity:   . Days of Exercise per Week:   . Minutes of Exercise per Session:   Stress:   . Feeling of Stress :   Social Connections:   . Frequency of Communication with Friends and Family:   . Frequency of Social Gatherings with Friends and Family:   . Attends Religious Services:   . Active Member of Clubs or Organizations:   . Attends Archivist Meetings:   Marland Kitchen Marital Status:   Intimate Partner Violence:   . Fear of Current or Ex-Partner:   . Emotionally Abused:   Marland Kitchen Physically Abused:   . Sexually Abused:      BP 112/72   Pulse 92   Ht 6\' 1"  (1.854 m)   Wt 244 lb (110.7 kg)   SpO2 98%   BMI 32.19 kg/m   Physical Exam:  Well appearing NAD HEENT: Unremarkable Neck:  No JVD, no thyromegally Lymphatics:  No adenopathy Back:  No CVA tenderness Lungs:  Clear with no wheezes HEART:  Regular rate rhythm, no murmurs, no rubs, no clicks Abd:  soft, positive bowel sounds, no organomegally, no rebound, no guarding Ext:  2 plus pulses, no edema, no cyanosis, no clubbing Skin:  No rashes no nodules Neuro:  CN II through XII intact, motor grossly intact  EKG - atrial fib with RBBB   Assess/Plan: 1. Atrial fib with a RVR -  I have discussed the treatment options with the patient and recommended he undergo DCCV in the coming weeks.  2. Chronic systolic heart failure -his EF is 25% by could be in part this low due to the atrial fib.  I would suggest we repeat the 2D echo 6 weeks or so after he has been cardioverted. If his EF remains less than or equal to 35%, I would recommend he undergo ICD insertion.  3. CAD - he is s/p MI with an occluded LAD. He has some collaterals. Unclear how much if any viability he has. I will defer work up of this to Dr. Arliss Journey. 4. Covid - he has not gotten covid and has not been vaccinated. I have strongly encouraged the patient to get at least one Covid vaccine dose.  5. Dyslipidemia - he will continue crestor.  Leonia Reeves.D.

## 2019-12-26 NOTE — Patient Instructions (Addendum)
Medication Instructions:  Your physician recommends that you continue on your current medications as directed. Please refer to the Current Medication list given to you today.  Labwork: None ordered.  Testing/Procedures: Your physician has requested that you have an echocardiogram. Echocardiography is a painless test that uses sound waves to create images of your heart. It provides your doctor with information about the size and shape of your heart and how well your heart's chambers and valves are working. This procedure takes approximately one hour. There are no restrictions for this procedure.  Please schedule for ECHO end of JUNE  Follow-Up: Your physician wants you to follow-up in: July with Dr. Ladona Ridgel.     Any Other Special Instructions Will Be Listed Below (If Applicable).  If you need a refill on your cardiac medications before your next appointment, please call your pharmacy.

## 2019-12-27 ENCOUNTER — Other Ambulatory Visit: Payer: Self-pay

## 2019-12-27 ENCOUNTER — Ambulatory Visit (INDEPENDENT_AMBULATORY_CARE_PROVIDER_SITE_OTHER): Payer: Commercial Managed Care - PPO | Admitting: Cardiology

## 2019-12-27 VITALS — BP 88/64 | HR 63 | Wt 245.2 lb

## 2019-12-27 DIAGNOSIS — I5022 Chronic systolic (congestive) heart failure: Secondary | ICD-10-CM | POA: Diagnosis not present

## 2019-12-27 LAB — BASIC METABOLIC PANEL
BUN/Creatinine Ratio: 12 (ref 10–24)
BUN: 13 mg/dL (ref 8–27)
CO2: 26 mmol/L (ref 20–29)
Calcium: 9.4 mg/dL (ref 8.6–10.2)
Chloride: 96 mmol/L (ref 96–106)
Creatinine, Ser: 1.07 mg/dL (ref 0.76–1.27)
GFR calc Af Amer: 86 mL/min/{1.73_m2} (ref >59)
GFR calc non Af Amer: 75 mL/min/{1.73_m2} (ref >59)
Glucose: 147 mg/dL — ABNORMAL HIGH (ref 65–99)
Potassium: 4.6 mmol/L (ref 3.5–5.2)
Sodium: 137 mmol/L (ref 134–144)

## 2019-12-27 NOTE — Patient Instructions (Addendum)
It was great meeting you today!  I recommend purchasing an [employer] blood pressure cuff (Bronze model or 3 series.)  Please check your blood pressure at least once a day an hour or 2 after waking.  Continue taking metoprolol 50 mg twice daily, spironolactone 12.5 mg once daily, furosemide 40 mg daily, and your Entresto 24/26 twice daily.    We will check your blood work today to verify your potassium level.    Please weigh yourself every morning and keep a log.  Limit your fluid intake to less than 2 liters a day.  Monitor your salt intake and exercise as tolerated  Please call us with your blood pressure readings   Phone: [phone] 12/27/2019 10:17 AM

## 2019-12-27 NOTE — Progress Notes (Signed)
Patient ID: Richard Graham                 DOB: May 18, 1956                      MRN: [id number]     HPI: Richard Graham is a 64 y.o. male referred by Dr. Radford Pax to HTN clinic. PMH is significant for A Fib diagnosed in March 2021, RBBB, CH (EF 20-25%), post MI (unknown date), pre DM, CAD and a middle cerebral artery aneurysm.  Seen by A Fib clinic on 12/20/19 and advised to remain on amiodarone 200mg  for 3-6 months and Eliquis 5 mg BID.  Scheduled for cardioversion tomorrow, 5/14.  Potassium elevated (5.3) on 12/20/19 and was advised to cut potassium supplement in half but unclear if should be on 10 or 20 mEq daily.  Is on rosuvastatin 5 mg daily for CAD.  Past in office BP readings were 110/78 on 12/11/19 and 112/72 on 12/26/19.  Presents today for first visit in HTN clinic.  Reports medication compliance and no adverse effects.  Was called and instructed to decrease his potassium chloride but reports he did not get message.  Has put on weight since being discharged from hospital and is trying to watch his diet.  Is eating more vegetables and trying to limit salt and sugar.  Was eating more fruit salad and grapefruit but discontinued due to sugar content.  Is trying to be more physically active but is limited by knee soreness.  Has been practicing meditation and prayer to help with blood pressure and sleep.  Reports occasional fatigue. Denies dizziness/lightheadedness.  Current HTN meds: metoprolol tartrate 50 mg BID, Entresto 24/26 BID, furosemide 40 mg daily, spironolactone 12.5 mg daily Previously tried: None listed BP goal: <130/80  Family History: No history on file  Social History: Former smoker, quit in 2013.  Occasional wine drinker  Diet: trying to cut down on salt. brussel sprouts, caulifower  Exercise: walked this morning, is trying to increase activity  Home BP readings: Is not testing at home  Wt Readings from Last 3 Encounters:  12/26/19 244 lb (110.7 kg)  12/20/19 243 lb 6.4 oz  (110.4 kg)  12/11/19 239 lb (108.4 kg)   BP Readings from Last 3 Encounters:  12/26/19 112/72  12/20/19 140/80  12/11/19 110/78   Pulse Readings from Last 3 Encounters:  12/26/19 92  12/20/19 93  12/11/19 67    Renal function: Estimated Creatinine Clearance: 100.2 mL/min (by C-G formula based on SCr of 1.01 mg/dL).  Past Medical History:  Diagnosis Date  . CHF (congestive heart failure) ([facility])   . Gout   . New onset atrial fibrillation ([facility])   . Pre-diabetes    diet controlled, no meds    Current Outpatient Medications on File Prior to Visit  Medication Sig Dispense Refill  . amiodarone (PACERONE) 200 MG tablet Take 1 tablet (200 mg total) by mouth daily. 30 tablet 1  . apixaban (ELIQUIS) 5 MG TABS tablet Take 1 tablet (5 mg total) by mouth 2 (two) times daily. 60 tablet 5  . aspirin EC 81 MG tablet Take 81 mg by mouth daily.    . colchicine 0.6 MG tablet Take 0.6 mg by mouth 2 (two) times daily as needed (for gout flares).     . diclofenac sodium (VOLTAREN) 1 % GEL Apply 2 g topically 2 (two) times daily as needed (to affected sites- for arthritis pain).     Marland Kitchen  furosemide (LASIX) 40 MG tablet Take 1 tablet (40 mg total) by mouth daily. 90 tablet 3  . HYDROcodone-acetaminophen (NORCO) 10-325 MG tablet Take 1 tablet by mouth 2 (two) times daily as needed for moderate pain.    . metoprolol tartrate (LOPRESSOR) 50 MG tablet Take 1 tablet (50 mg total) by mouth 2 (two) times daily. 60 tablet 3  . potassium chloride SA (KLOR-CON) 20 MEQ tablet Take 1 tablet (20 mEq total) by mouth daily. 30 tablet 3  . rosuvastatin (CRESTOR) 5 MG tablet Take 1 tablet (5 mg total) by mouth daily. 30 tablet 3  . sacubitril-valsartan (ENTRESTO) 24-26 MG Take 1 tablet by mouth 2 (two) times daily. 45 tablet 1  . spironolactone (ALDACTONE) 25 MG tablet Take 0.5 tablets (12.5 mg total) by mouth daily. 45 tablet 3   No current facility-administered medications on file prior to visit.    No Known  Allergies   Assessment/Plan:  1. Hypertension - Patient's blood pressure today 88/64 and 90/60 on recheck with no signs of dizziness.  This is lower than his previous readings and patient does not think it has been this low before.  HR 63.  Recommended patient pick up home BP monitoring cuff such as Omron Bronze or series 3 to check BP at home and to call with readings.  Recommended patient begin weighing himself daily when he wakes up to monitor for fluid retention and to call if he gains 3# or more overnight or 5# during the week.  Counseled patient to limit salt in diet and to keep fluid intake to 2L a day or less.  BMP ordered today to check potassium level to verify if Klor-Con needs to be decreased.  Will follow up with patient over the phone on Monday 5/17 to check BP readings.  Laural Golden, PharmD, BCACP, CDCES [facility] [address] [place], [place] [address] Phone: [phone]; Fax: [phone] 12/27/2019 11:06 AM

## 2019-12-28 ENCOUNTER — Encounter (HOSPITAL_COMMUNITY): Payer: Self-pay | Admitting: Cardiovascular Disease

## 2019-12-28 ENCOUNTER — Encounter (HOSPITAL_COMMUNITY)
Admission: RE | Disposition: A | Discharge status: Home / Self Care | Payer: Self-pay | Source: Home / Self Care | Attending: Cardiovascular Disease

## 2019-12-28 ENCOUNTER — Ambulatory Visit (HOSPITAL_COMMUNITY)
Admission: RE | Admit: 2019-12-28 | Discharge: 2019-12-28 | Disposition: A | Discharge status: Home / Self Care | Payer: Commercial Managed Care - PPO | Attending: Cardiovascular Disease | Admitting: Cardiovascular Disease

## 2019-12-28 ENCOUNTER — Ambulatory Visit (HOSPITAL_COMMUNITY): Payer: Commercial Managed Care - PPO | Admitting: Certified Registered Nurse Anesthetist

## 2019-12-28 DIAGNOSIS — Z79899 Other long term (current) drug therapy: Secondary | ICD-10-CM | POA: Insufficient documentation

## 2019-12-28 DIAGNOSIS — Z7901 Long term (current) use of anticoagulants: Secondary | ICD-10-CM | POA: Diagnosis not present

## 2019-12-28 DIAGNOSIS — I4891 Unspecified atrial fibrillation: Secondary | ICD-10-CM | POA: Diagnosis not present

## 2019-12-28 DIAGNOSIS — I671 Cerebral aneurysm, nonruptured: Secondary | ICD-10-CM | POA: Insufficient documentation

## 2019-12-28 DIAGNOSIS — I2510 Atherosclerotic heart disease of native coronary artery without angina pectoris: Secondary | ICD-10-CM | POA: Insufficient documentation

## 2019-12-28 DIAGNOSIS — I4819 Other persistent atrial fibrillation: Secondary | ICD-10-CM

## 2019-12-28 DIAGNOSIS — I5022 Chronic systolic (congestive) heart failure: Secondary | ICD-10-CM | POA: Insufficient documentation

## 2019-12-28 DIAGNOSIS — M109 Gout, unspecified: Secondary | ICD-10-CM | POA: Diagnosis not present

## 2019-12-28 DIAGNOSIS — Z87891 Personal history of nicotine dependence: Secondary | ICD-10-CM | POA: Diagnosis not present

## 2019-12-28 DIAGNOSIS — R7303 Prediabetes: Secondary | ICD-10-CM | POA: Diagnosis not present

## 2019-12-28 HISTORY — PX: CARDIOVERSION: SHX1299

## 2019-12-28 SURGERY — CARDIOVERSION
Anesthesia: General

## 2019-12-28 MED ORDER — SODIUM CHLORIDE 0.9 % IV SOLN
INTRAVENOUS | Status: AC | PRN
  Administered 2019-12-28: 500 mL via INTRAVENOUS

## 2019-12-28 MED ORDER — PROPOFOL 10 MG/ML IV BOLUS
INTRAVENOUS | Status: DC | PRN
  Administered 2019-12-28: 70 mg via INTRAVENOUS
  Administered 2019-12-28: 30 mg via INTRAVENOUS

## 2019-12-28 MED ORDER — LIDOCAINE 2% (20 MG/ML) 5 ML SYRINGE
INTRAMUSCULAR | Status: DC | PRN
  Administered 2019-12-28: 40 mg via INTRAVENOUS

## 2019-12-28 NOTE — CV Procedure (Signed)
Electrical Cardioversion Procedure Note CHAUNCE WINKELS [id number] [birth year]  Procedure: Electrical Cardioversion Indications:  Atrial Fibrillation  Procedure Details Consent: Risks of procedure as well as the alternatives and risks of each were explained to the (patient/caregiver).  Consent for procedure obtained. Time Out: Verified patient identification, verified procedure, site/side was marked, verified correct patient position, special equipment/implants available, medications/allergies/relevent history reviewed, required imaging and test results available.  Performed  Patient placed on cardiac monitor, pulse oximetry, supplemental oxygen as necessary.  Sedation given: prop Pacer pads placed anterior and posterior chest.  Cardioverted 2 time(s).  Cardioverted at 150J unsuccessful.  200J successful.  Evaluation Findings: Post procedure EKG shows: NSR Complications: None Patient did tolerate procedure well.   Chilton Si, MD 12/28/2019, 10:04 AM

## 2019-12-28 NOTE — Transfer of Care (Signed)
Immediate Anesthesia Transfer of Care Note  Patient: Richard Graham  Procedure(s) Performed: CARDIOVERSION (N/A )  Patient Location: PACU  Anesthesia Type:General  Level of Consciousness: awake  Airway & Oxygen Therapy: Patient Spontanous Breathing  Post-op Assessment: Report given to RN and Post -op Vital signs reviewed and stable  Post vital signs: Reviewed and stable  Last Vitals:  Vitals Value Taken Time  BP 94/67 12/28/19 1006  Temp 36.7 C 12/28/19 1004  Pulse 58 12/28/19 1007  Resp 14 12/28/19 1007  SpO2 98 % 12/28/19 1007  Vitals shown include unvalidated device data.  Last Pain:  Vitals:   12/28/19 1004  TempSrc: Temporal  PainSc: 0-No pain         Complications: No apparent anesthesia complications

## 2019-12-28 NOTE — Anesthesia Preprocedure Evaluation (Signed)
Anesthesia Evaluation  Patient identified by MRN, date of birth, ID band Patient awake    Reviewed: Allergy & Precautions, NPO status , Patient's Chart, lab work & pertinent test results  Airway Mallampati: II  TM Distance: >3 FB     Dental  (+) Teeth Intact   Pulmonary former smoker,    breath sounds clear to auscultation       Cardiovascular  Rhythm:Irregular Rate:Normal     Neuro/Psych    GI/Hepatic   Endo/Other    Renal/GU      Musculoskeletal   Abdominal   Peds  Hematology   Anesthesia Other Findings   Reproductive/Obstetrics                             Anesthesia Physical Anesthesia Plan  ASA: III  Anesthesia Plan: General   Post-op Pain Management:    Induction: Intravenous  PONV Risk Score and Plan:   Airway Management Planned: Mask  Additional Equipment:   Intra-op Plan:   Post-operative Plan:   Informed Consent: I have reviewed the patients History and Physical, chart, labs and discussed the procedure including the risks, benefits and alternatives for the proposed anesthesia with the patient or authorized representative who has indicated his/her understanding and acceptance.       Plan Discussed with: CRNA and Anesthesiologist  Anesthesia Plan Comments:         Anesthesia Quick Evaluation

## 2019-12-28 NOTE — Anesthesia Postprocedure Evaluation (Signed)
Anesthesia Post Note  Patient: Richard Graham  Procedure(s) Performed: CARDIOVERSION (N/A )     Patient location during evaluation: Endoscopy Anesthesia Type: General Level of consciousness: awake and alert Pain management: pain level controlled Vital Signs Assessment: post-procedure vital signs reviewed and stable Respiratory status: spontaneous breathing, nonlabored ventilation, respiratory function stable and patient connected to nasal cannula oxygen Cardiovascular status: blood pressure returned to baseline and stable Postop Assessment: no apparent nausea or vomiting Anesthetic complications: no    Last Vitals:  Vitals:   12/28/19 1030 12/28/19 1045  BP: 93/67 90/67  Pulse: (!) 57 62  Resp: (!) 22 15  Temp:    SpO2: 100% 100%    Last Pain:  Vitals:   12/28/19 1045  TempSrc:   PainSc: 0-No pain                 Lilee Aldea COKER

## 2019-12-28 NOTE — Discharge Instructions (Signed)
Electrical Cardioversion   What can I expect after the procedure?  Your blood pressure, heart rate, breathing rate, and blood oxygen level will be monitored until you leave the hospital or clinic.  Your heart rhythm will be watched to make sure it does not change.  You may have some redness on the skin where the shocks were given. Follow these instructions at home:  Do not drive for 24 hours if you were given a sedative during your procedure.  Take over-the-counter and prescription medicines only as told by your health care provider.  Ask your health care provider how to check your pulse. Check it often.  Rest for 48 hours after the procedure or as told by your health care provider.  Avoid or limit your caffeine use as told by your health care provider.  Keep all follow-up visits as told by your health care provider. This is important. Contact a health care provider if:  You feel like your heart is beating too quickly or your pulse is not regular.  You have a serious muscle cramp that does not go away. Get help right away if:  You have discomfort in your chest.  You are dizzy or you feel faint.  You have trouble breathing or you are short of breath.  Your speech is slurred.  You have trouble moving an arm or leg on one side of your body.  Your fingers or toes turn cold or blue. Summary  Electrical cardioversion is the delivery of a jolt of electricity to restore a normal rhythm to the heart.  This procedure may be done right away in an emergency or may be a scheduled procedure if the condition is not an emergency.  Generally, this is a safe procedure.  After the procedure, check your pulse often as told by your health care provider. This information is not intended to replace advice given to you by your health care provider. Make sure you discuss any questions you have with your health care provider. Document Revised: 03/05/2019 Document Reviewed:  03/05/2019 Elsevier Patient Education  2020 Elsevier Inc.  

## 2019-12-31 NOTE — H&P (Signed)
History and Physical Interval Note:    Richard Graham is a 64 y.o. male has presented today for surgery, with the diagnosis of Atrial Fibrillation The various methods of treatment have been discussed with the patient and family. After consideration of risks, benefits and other options for treatment, the patient has consented to Procedure(s): CARDIOVERSION (N/A) as a surgical intervention . The patient's history has been reviewed, patient examined, no change in status, stable for surgery. I have reviewed the patient's chart and labs. Questions were answered to the patient's satisfaction.    Richard Graham C. Duke Salvia, MD, [facility]  12/28/19 12:45 PM

## 2020-01-03 ENCOUNTER — Telehealth: Payer: Self-pay | Admitting: Pharmacist

## 2020-01-03 NOTE — Telephone Encounter (Signed)
Spoke with patient's wife to check on patient's BP on 5/17 but patient was at dentist.  Asked for call back.  Called today again and patient not in.  Asked for call back.

## 2020-01-04 ENCOUNTER — Other Ambulatory Visit: Payer: Self-pay

## 2020-01-04 ENCOUNTER — Encounter (HOSPITAL_COMMUNITY): Payer: Self-pay | Admitting: Nurse Practitioner

## 2020-01-04 ENCOUNTER — Ambulatory Visit: Payer: Commercial Managed Care - PPO | Admitting: General Practice

## 2020-01-04 ENCOUNTER — Ambulatory Visit (HOSPITAL_COMMUNITY)
Admission: RE | Admit: 2020-01-04 | Discharge: 2020-01-04 | Disposition: A | Discharge status: Home / Self Care | Payer: Commercial Managed Care - PPO | Source: Ambulatory Visit | Attending: Nurse Practitioner | Admitting: Nurse Practitioner

## 2020-01-04 VITALS — BP 98/62 | HR 70 | Ht 73.0 in | Wt 243.6 lb

## 2020-01-04 DIAGNOSIS — I2510 Atherosclerotic heart disease of native coronary artery without angina pectoris: Secondary | ICD-10-CM | POA: Insufficient documentation

## 2020-01-04 DIAGNOSIS — I4891 Unspecified atrial fibrillation: Secondary | ICD-10-CM | POA: Diagnosis not present

## 2020-01-04 DIAGNOSIS — Z87891 Personal history of nicotine dependence: Secondary | ICD-10-CM | POA: Diagnosis not present

## 2020-01-04 DIAGNOSIS — R7303 Prediabetes: Secondary | ICD-10-CM | POA: Insufficient documentation

## 2020-01-04 DIAGNOSIS — I5020 Unspecified systolic (congestive) heart failure: Secondary | ICD-10-CM | POA: Diagnosis not present

## 2020-01-04 DIAGNOSIS — Z7901 Long term (current) use of anticoagulants: Secondary | ICD-10-CM | POA: Insufficient documentation

## 2020-01-04 DIAGNOSIS — Z79899 Other long term (current) drug therapy: Secondary | ICD-10-CM | POA: Diagnosis not present

## 2020-01-04 DIAGNOSIS — D6869 Other thrombophilia: Secondary | ICD-10-CM | POA: Diagnosis not present

## 2020-01-04 DIAGNOSIS — Z7982 Long term (current) use of aspirin: Secondary | ICD-10-CM | POA: Diagnosis not present

## 2020-01-04 NOTE — Progress Notes (Signed)
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 male with a h/o atrial fibrillation,systolic heart failure, cerebral aneurysm, CAD (CTO of LAD)and prediabetes.  He was initially seen 11/05/2019 when he presented to ER with significant weight gain, lower extremity edema, orthopnea and dyspnea on exertion. He was found to be in new onset atrial fibrillation with RVR, and new systolic heart failure with an EF of 20 to 25%. Underwent cardiac catheterization with chronically occluded mid LAD with left to left collaterals. Plan was for medical therapy but at some point could consider viability evaluation with possible CTO intervention. An incidental finding of a cerebral aneurysm was found during this admission. Stroke was ruled out, IR was consulted and Dr. Rebeca Alert to perform angiography with possible embolization within a week and therefore not placed on oral anticoagulation. He went on to have that procedure done 4/8 and  was stated on anticoagulation 4/9, eliquis 5 mg bid with a CHA2DS2VASc score of 2(chf, pre DM), in anticipation of cardioversion.   Pt is now in the afib clinic for scheduling of cardioversion. He notes that he has not missed any doses of eliquis.   He was laoded on amiodarone at time of hospitalization in March and is now on 200 mg daily. I would recommend continuing  on amiodarone  after cardioversion and then  repeating echo. . If shows improvement in left atrial size, could refer for ablation. His last echo reported out mild dilation of left atrium, but revealed a left atrial size of 5.3 cm and volume of 62,  which indicates to me severely dilated. This would have to improve to make him a good candidate for ablation.   He has one cup of coffee  a day, 1-2 glasses of wine per night and reports witnessed  snoring .  F/u in afib clinic, 01/04/20. He had successful cardioversion and remains in sinus rhythm. He  feels improved. He saw Dr. Ladona Ridgel for possibility of an ICD. The plan was to repeat echo after 6 weeks in SR to see if EF improves. He continues on amiodarone 200 mg qd as well as eliquis 5 mg bid.     Today, he denies symptoms of palpitations, chest pain, shortness of breath, orthopnea, PND, lower extremity edema, dizziness, presyncope, syncope, or neurologic sequela. The patient is tolerating medications without difficulties and is otherwise without complaint today.   Past Medical History:  Diagnosis Date  . CHF (congestive heart failure) ([facility])   . Gout   . New onset atrial fibrillation ([facility])   . Pre-diabetes    diet controlled, no meds   Past Surgical History:  Procedure Laterality Date  . CARDIOVERSION N/A 12/28/2019   Procedure: CARDIOVERSION;  Surgeon: Chilton Si, MD;  [facility] ENDOSCOPY;  Service: Cardiovascular;  Laterality: N/A;  . COLONOSCOPY    . INSERTION OF MESH N/A 06/27/2015   Procedure: INSERTION OF MESH;  Surgeon: Axel Filler, MD;  Location: [facility] ORS;  Service: General;  Laterality: N/A;  . IR 3D INDEPENDENT WKST  11/14/2019  . IR 3D INDEPENDENT WKST  11/22/2019  . IR ANGIO INTRA EXTRACRAN SEL COM CAROTID INNOMINATE BILAT MOD SED  11/14/2019  . IR ANGIO INTRA EXTRACRAN SEL INTERNAL CAROTID UNI R MOD SED  11/22/2019  . IR ANGIO VERTEBRAL SEL VERTEBRAL BILAT MOD SED  11/14/2019  . IR ANGIOGRAM FOLLOW UP STUDY  11/22/2019  . IR CT HEAD LTD  11/22/2019  . IR NEURO  EACH ADD'L AFTER BASIC UNI RIGHT (MS)  11/22/2019  . IR TRANSCATH/EMBOLIZ  11/22/2019  . laceration to fingers     left hand  . MOUTH SURGERY     secondary to abscess   . RADIOLOGY WITH ANESTHESIA N/A 11/22/2019   Procedure: EMBOLIZATION;  Surgeon: Julieanne Cotton, MD;  Location: [facility] OR;  Service: Radiology;  Laterality: N/A;  . RIGHT/LEFT HEART CATH AND CORONARY ANGIOGRAPHY N/A 11/12/2019   Procedure: RIGHT/LEFT HEART CATH AND CORONARY ANGIOGRAPHY;  Surgeon: Corky Crafts, MD;  Location: [facility] INVASIVE CV  LAB;  Service: Cardiovascular;  Laterality: N/A;  . trauma to lip      sutured  . VENTRAL HERNIA REPAIR N/A 06/27/2015   Procedure: LAPAROSCOPIC VENTRAL HERNIA;  Surgeon: Axel Filler, MD;  Location: [facility] ORS;  Service: General;  Laterality: N/A;    Current Outpatient Medications  Medication Sig Dispense Refill  . amiodarone (PACERONE) 200 MG tablet Take 1 tablet (200 mg total) by mouth daily. 30 tablet 1  . apixaban (ELIQUIS) 5 MG TABS tablet Take 1 tablet (5 mg total) by mouth 2 (two) times daily. 60 tablet 5  . aspirin EC 81 MG tablet Take 81 mg by mouth daily.    . colchicine 0.6 MG tablet Take 0.6 mg by mouth 2 (two) times daily as needed (for gout flares).     . diclofenac sodium (VOLTAREN) 1 % GEL Apply 2 g topically 2 (two) times daily as needed (to affected sites- for arthritis pain).     . furosemide (LASIX) 40 MG tablet Take 1 tablet (40 mg total) by mouth daily. 90 tablet 3  . HYDROcodone-acetaminophen (NORCO) 10-325 MG tablet Take 1 tablet by mouth 2 (two) times daily as needed for moderate pain.    . metoprolol tartrate (LOPRESSOR) 50 MG tablet Take 1 tablet (50 mg total) by mouth 2 (two) times daily. 60 tablet 3  . potassium chloride SA (KLOR-CON) 20 MEQ tablet Take 1 tablet (20 mEq total) by mouth daily. 30 tablet 3  . rosuvastatin (CRESTOR) 5 MG tablet Take 1 tablet (5 mg total) by mouth daily. 30 tablet 3  . sacubitril-valsartan (ENTRESTO) 24-26 MG Take 1 tablet by mouth 2 (two) times daily. 45 tablet 1  . spironolactone (ALDACTONE) 25 MG tablet Take 0.5 tablets (12.5 mg total) by mouth daily. 45 tablet 3   No current facility-administered medications for this encounter.    No Known Allergies  Social History   Socioeconomic History  . Marital status: Married    Spouse name: Not on file  . Number of children: Not on file  . Years of education: Not on file  . Highest education level: Not on file  Occupational History  . Not on file  Tobacco Use  . Smoking  status: Former Smoker    Packs/day: 0.25    Years: 3.00    Pack years: 0.75    Types: Cigarettes    Quit date: 08/17/2011    Years since quitting: 8.3  . Smokeless tobacco: Never Used  Substance and Sexual Activity  . Alcohol use: Yes    Alcohol/week: 1.0 standard drinks    Types: 1 Glasses of wine per week    Comment: occasional wine  . Drug use: No  . Sexual activity: Yes  Other Topics Concern  . Not on file  Social History Narrative  . Not on file   Social Determinants of Health   Financial Resource Strain:   . Difficulty of Paying Living Expenses:  Food Insecurity:   . Worried About Programme researcher, broadcasting/film/video in the Last Year:   . Barista in the Last Year:   Transportation Needs:   . Freight forwarder (Medical):   Marland Kitchen Lack of Transportation (Non-Medical):   Physical Activity:   . Days of Exercise per Week:   . Minutes of Exercise per Session:   Stress:   . Feeling of Stress :   Social Connections:   . Frequency of Communication with Friends and Family:   . Frequency of Social Gatherings with Friends and Family:   . Attends Religious Services:   . Active Member of Clubs or Organizations:   . Attends Banker Meetings:   Marland Kitchen Marital Status:   Intimate Partner Violence:   . Fear of Current or Ex-Partner:   . Emotionally Abused:   Marland Kitchen Physically Abused:   . Sexually Abused:     No family history on file.  ROS- All systems are reviewed and negative except as per the HPI above  Physical Exam: Vitals:   01/04/20 0940  BP: 98/62  Pulse: 70  Weight: 110.5 kg  Height: 6\' 1"  (1.854 m)   Wt Readings from Last 3 Encounters:  01/04/20 110.5 kg  12/28/19 111.2 kg  12/27/19 111.2 kg    Labs: Lab Results  Component Value Date   NA 137 12/27/2019   K 4.6 12/27/2019   CL 96 12/27/2019   CO2 26 12/27/2019   GLUCOSE 147 (H) 12/27/2019   BUN 13 12/27/2019   CREATININE 1.07 12/27/2019   CALCIUM 9.4 12/27/2019   PHOS 3.8 11/10/2019   MG 2.3  11/14/2019   Lab Results  Component Value Date   INR 1.1 11/22/2019   Lab Results  Component Value Date   CHOL 103 11/07/2019   HDL 40 (L) 11/07/2019   LDLCALC 56 11/07/2019   TRIG 36 11/07/2019     GEN- The patient is well appearing, alert and oriented x 3 today.   Head- normocephalic, atraumatic Eyes-  Sclera clear, conjunctiva pink Ears- hearing intact Oropharynx- clear Neck- supple, no JVP Lymph- no cervical lymphadenopathy Lungs- Clear to ausculation bilaterally, normal work of breathing Heart-regular rate and rhythm, no murmurs, rubs or gallops, PMI not laterally displaced GI- soft, NT, ND, + BS Extremities- no clubbing, cyanosis, or edema MS- no significant deformity or atrophy Skin- no rash or lesion Psych- euthymic mood, full affect Neuro- strength and sensation are intact  EKG- NSR at 70 bpm, pr int 190 ms, qrs int 162 ms, qtc 483 ms  Echo- LV function is severely reduced, 20-25%. The mid anteroseptum into  apex is akinetic, almost aneurysmal, consistent with prior LAD infarction  (Q waves on EKG). The myocardium is thinned, futher supporting prior  infarction. On contrast imaging, there  is no LV thrombus seen.  2. Left ventricular ejection fraction, by estimation, is 20 to 25%. The  left ventricle has severely decreased function. The left ventricle  demonstrates regional wall motion abnormalities (see scoring  diagram/findings for description). There is mild  concentric left ventricular hypertrophy. Left ventricular diastolic  function could not be evaluated.  3. Right ventricular systolic function is mildly reduced. The right  ventricular size is normal. There is mildly elevated pulmonary artery  systolic pressure. The estimated right ventricular systolic pressure is  42.2 mmHg.  4. Left atrial size was mildly dilated.  5. The mitral valve is grossly normal. Trivial mitral valve  regurgitation. No evidence of mitral stenosis.  6. The aortic valve is  tricuspid. Aortic valve regurgitation is not  visualized. No aortic stenosis is present.    Assessment and Plan: 1. New onset  afib  March 2021 Loaded on amiodarone at that time Now on 200 mg qd Successful cardioversion 12/28/19 Continue metoprolol tartrate 50 mg bid  Has been referred for sleep study  Advised no more than 2 glasses of wine a week   2. CHA2DS2VASc score of 2 Continue eliquis 5 mg bid  Anticoagulation had  to be delayed until cerebral aneurysm could be embolized  He has now been on anticoagulation since 4/9    3. EF of 25% Has been evaluated for ICD by Dr. Lovena Le  Echo pending 6/11, to see if SR improves LV function  Can possibly be considered for ablation in the  near future to get off amiodarone  F/u with Dr. Radford Pax 6/21, Dr. Lovena Le  7/15  Richard Graham, [facility] [address] [place], [place] [address] [phone]

## 2020-01-07 ENCOUNTER — Telehealth: Payer: Self-pay | Admitting: Cardiology

## 2020-01-07 NOTE — Telephone Encounter (Signed)
FMLA/disability form received from [employer]. Placed in box for Dr. Mayford Knife to review. 01/07/20 vlm

## 2020-01-08 NOTE — Telephone Encounter (Signed)
Left message for patient to call back  

## 2020-01-09 NOTE — Telephone Encounter (Signed)
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 He has been approved through 06/23 and would an extension for one more month.

## 2020-01-10 ENCOUNTER — Telehealth: Payer: Self-pay

## 2020-01-10 NOTE — Telephone Encounter (Signed)
Patient called requesting a handicap sticker. Patient states that he needs it because of his CHF and pain in his knees due to medications that he is taking. Patient denies any SOB with exertion, states that he just has been recovering slowly and is getting out more often.  Advised patient that I would forward to Dr. Mayford Knife for review.

## 2020-01-10 NOTE — Telephone Encounter (Signed)
Would prefer he get this from his PCP

## 2020-01-15 NOTE — Telephone Encounter (Signed)
Spoke with the patient who states that he is in the process of trying to find a new PCP.

## 2020-01-18 ENCOUNTER — Other Ambulatory Visit: Payer: Self-pay | Admitting: Cardiology

## 2020-01-21 ENCOUNTER — Telehealth: Payer: Self-pay | Admitting: *Deleted

## 2020-01-21 NOTE — Telephone Encounter (Signed)
Staff message sent to Coralee North ok to schedule HST. PA is not required by [facility].

## 2020-01-21 NOTE — Telephone Encounter (Signed)
-----   Message from [name] Overbey, RN sent at 12/11/2019 11:39 AM EDT ----- Home sleep test has been ordered.  Thanks!  

## 2020-01-22 ENCOUNTER — Institutional Professional Consult (permissible substitution): Payer: Commercial Managed Care - PPO | Admitting: Internal Medicine

## 2020-01-24 NOTE — Telephone Encounter (Signed)
Patient is aware and agreeable to Home Sleep Study through [facility]. Patient is scheduled for 04/04/20 at 11:30 to pick up home sleep kit and meet with Respiratory therapist at [facility]. Patient is aware that if this appointment date and time does not work for them they should contact Artis Delay directly at [phone]. Patient is aware that a sleep packet will be sent from [facility] in week. Patient is agreeable to treatment and thankful for call.

## 2020-01-25 ENCOUNTER — Other Ambulatory Visit: Payer: Self-pay

## 2020-01-25 ENCOUNTER — Ambulatory Visit (HOSPITAL_COMMUNITY): Payer: Commercial Managed Care - PPO | Attending: Cardiology

## 2020-01-25 DIAGNOSIS — I5022 Chronic systolic (congestive) heart failure: Secondary | ICD-10-CM | POA: Diagnosis not present

## 2020-01-25 MED ORDER — PERFLUTREN LIPID MICROSPHERE
1.0000 mL | INTRAVENOUS | Status: AC | PRN
  Administered 2020-01-25: 1 mL via INTRAVENOUS

## 2020-01-28 ENCOUNTER — Telehealth: Payer: Self-pay

## 2020-01-28 NOTE — Telephone Encounter (Signed)
New message    The patient is asking can you accepted him as a new patient   Previous patient of Dr. Holley Bouche.   The patient is asking to worked in this week.

## 2020-01-30 ENCOUNTER — Telehealth: Payer: Self-pay | Admitting: Cardiology

## 2020-01-30 NOTE — Telephone Encounter (Signed)
Avraham is calling stating he is returning a call from Dr. Norris Cross office that he just missed. He states no message was left and I was unable to find any documentation. Jaishaun is requesting any time our office calls a message is left due to it causing concern when you do not know why the Doctor is calling. Please advise.

## 2020-02-04 ENCOUNTER — Other Ambulatory Visit: Payer: Self-pay

## 2020-02-04 ENCOUNTER — Ambulatory Visit (INDEPENDENT_AMBULATORY_CARE_PROVIDER_SITE_OTHER): Payer: Commercial Managed Care - PPO | Admitting: Cardiology

## 2020-02-04 ENCOUNTER — Encounter: Payer: Self-pay | Admitting: Cardiology

## 2020-02-04 VITALS — BP 90/60 | HR 58 | Ht 73.0 in | Wt 246.0 lb

## 2020-02-04 DIAGNOSIS — E785 Hyperlipidemia, unspecified: Secondary | ICD-10-CM

## 2020-02-04 DIAGNOSIS — I480 Paroxysmal atrial fibrillation: Secondary | ICD-10-CM | POA: Diagnosis not present

## 2020-02-04 DIAGNOSIS — E7800 Pure hypercholesterolemia, unspecified: Secondary | ICD-10-CM

## 2020-02-04 DIAGNOSIS — I5022 Chronic systolic (congestive) heart failure: Secondary | ICD-10-CM

## 2020-02-04 DIAGNOSIS — I2510 Atherosclerotic heart disease of native coronary artery without angina pectoris: Secondary | ICD-10-CM | POA: Diagnosis not present

## 2020-02-04 DIAGNOSIS — I671 Cerebral aneurysm, nonruptured: Secondary | ICD-10-CM | POA: Diagnosis not present

## 2020-02-04 DIAGNOSIS — I2583 Coronary atherosclerosis due to lipid rich plaque: Secondary | ICD-10-CM

## 2020-02-04 MED ORDER — METOPROLOL SUCCINATE ER 25 MG PO TB24
25.0000 mg | ORAL_TABLET | Freq: Every day | ORAL | 3 refills | Status: DC
Start: 2020-02-04 — End: 2020-07-23

## 2020-02-04 MED ORDER — FUROSEMIDE 20 MG PO TABS
20.0000 mg | ORAL_TABLET | Freq: Every day | ORAL | 3 refills | Status: DC
Start: 2020-02-04 — End: 2020-04-24

## 2020-02-04 NOTE — Progress Notes (Signed)
Cardiology Office Note:    Date:  02/04/2020   ID:  JEDI CATALFAMO, DOB 1956/05/29, MRN [id number]  PCP:  Patient, No Pcp Per  Cardiologist:  Armanda Magic, MD    Referring MD: No ref. provider found   Chief Complaint  Patient presents with  . Coronary Artery Disease  . Hyperlipidemia  . Atrial Fibrillation    History of Present Illness:    Richard Graham is a 64 y.o. male with a hx of atrial fibrillation,systolic heart failure, cerebral aneurysm, CAD (CTO of LAD)and prediabetes.  He was initially seen 11/05/2019 when he presented to ER with significant weight gain, lower extremity edema, orthopnea and dyspnea on exertion. He was found to be in new onset atrial fibrillation with RVR, and new systolic heart failure with an EF of 20 to 25%. Underwent cardiac catheterization with chronically occluded mid LAD with left to left collaterals. Plan was for medical therapy but at some point could consider viability evaluation with possible CTO intervention. An incidental finding of a cerebral aneurysm was found during this admission. Stroke was ruled out, IR was consulted and Dr. Rebeca Alert to perform angiography with possible embolization within a week and therefore not placed on oral anticoagulation. Per Dr. Beverely Risen given the patient's P2Y12 was 7 during admission it was recommended that he be discharged onBrilinta 30mg  twice daily along with aspirin 81 mg daily as well as Amio for rate control of afib. In regards to his Lasix dosing he was sent home on 80 mg daily with potassium supplement.  He then presented 11/22/2019 for right common carotid arteriogram followed by embolization of the right MCA bifurcation aneurysm by Dr. 01/22/2020 with no complications. He was started on Apixaban and after 4 weeks underwent DCCV to NSR on 12/28/2019.  He was seen back in Afib clinic 01/04/20 and continued on Lopressor 50mg  BID and Amio 200mg  daily and was maintaining NSR.  He was  cautioned about ETOH intake.  He also was referred for sleep study which has not been done yet.    Repeat 2D echo showed persistently reduced LVF with EF 20-25% with severe LVE and mid to apical anteroseptal/apical and anterior AK.  There was also moderately reduced RVF and  Moderate pulmonary HTN with PASP 01/06/20.  Cardiac MRI demonstrated severe LVE with severe LV dysfunction with EF 22% and apical aneurysm involving all apical segments and mid anterior wall.  There was no evidence of  viability in most of the segments supplied by mid and apical LAD (mid anterior and apical septal, anterior, interior walls and in the true apex - total of 5segments).  There was also moderate RV dysfunction, and severe LAE.    He comes today with his wife and has an extensive list of questions.  He continues to feel fatigue and gets dizzy and SOB when walking up stairs which he attributes to weight gain.  He gets dizzy when going from sitting to standing.  He denies any chest pain or pressure, PND, orthopnea, LE edema,  palpitations or syncope. He is compliant with his meds and is tolerating meds with no SE.    Past Medical History:  Diagnosis Date  . CAD (coronary artery disease), native coronary artery    chronically occluded mid LAD with left to left collaterals.   . Cerebral aneurysm    s/p embolization  . Chronic systolic (congestive) heart failure (HCC)    biventricular HF with moderate RV dysfunction and severe LV dysfunction by Cardiac MRI with  EF 22% no viability in the apical septal/apical/inferior,mid anterior and true apex.    . Gout   . HLD (hyperlipidemia)   . PAF (paroxysmal atrial fibrillation) (HCC)    s/p DCCV 12/2019  . Pre-diabetes    diet controlled, no meds    Past Surgical History:  Procedure Laterality Date  . CARDIOVERSION N/A 12/28/2019   Procedure: CARDIOVERSION;  Surgeon: Chilton Si, MD;  Location: [facility] ENDOSCOPY;  Service: Cardiovascular;  Laterality: N/A;  . COLONOSCOPY     . INSERTION OF MESH N/A 06/27/2015   Procedure: INSERTION OF MESH;  Surgeon: Axel Filler, MD;  Location: [facility] ORS;  Service: General;  Laterality: N/A;  . IR 3D INDEPENDENT WKST  11/14/2019  . IR 3D INDEPENDENT WKST  11/22/2019  . IR ANGIO INTRA EXTRACRAN SEL COM CAROTID INNOMINATE BILAT MOD SED  11/14/2019  . IR ANGIO INTRA EXTRACRAN SEL INTERNAL CAROTID UNI R MOD SED  11/22/2019  . IR ANGIO VERTEBRAL SEL VERTEBRAL BILAT MOD SED  11/14/2019  . IR ANGIOGRAM FOLLOW UP STUDY  11/22/2019  . IR CT HEAD LTD  11/22/2019  . IR NEURO EACH ADD'L AFTER BASIC UNI RIGHT (MS)  11/22/2019  . IR TRANSCATH/EMBOLIZ  11/22/2019  . laceration to fingers     left hand  . MOUTH SURGERY     secondary to abscess   . RADIOLOGY WITH ANESTHESIA N/A 11/22/2019   Procedure: EMBOLIZATION;  Surgeon: Julieanne Cotton, MD;  Location: [facility] OR;  Service: Radiology;  Laterality: N/A;  . RIGHT/LEFT HEART CATH AND CORONARY ANGIOGRAPHY N/A 11/12/2019   Procedure: RIGHT/LEFT HEART CATH AND CORONARY ANGIOGRAPHY;  Surgeon: Corky Crafts, MD;  Location: [facility] INVASIVE CV LAB;  Service: Cardiovascular;  Laterality: N/A;  . trauma to lip      sutured  . VENTRAL HERNIA REPAIR N/A 06/27/2015   Procedure: LAPAROSCOPIC VENTRAL HERNIA;  Surgeon: Axel Filler, MD;  Location: [facility] ORS;  Service: General;  Laterality: N/A;    Current Medications: Current Meds  Medication Sig  . amiodarone (PACERONE) 200 MG tablet Take 1 tablet (200 mg total) by mouth daily.  Marland Kitchen apixaban (ELIQUIS) 5 MG TABS tablet Take 1 tablet (5 mg total) by mouth 2 (two) times daily.  Marland Kitchen aspirin EC 81 MG tablet Take 81 mg by mouth daily.  . colchicine 0.6 MG tablet Take 0.6 mg by mouth 2 (two) times daily as needed (for gout flares).   . diclofenac sodium (VOLTAREN) 1 % GEL Apply 2 g topically 2 (two) times daily as needed (to affected sites- for arthritis pain).   Marland Kitchen HYDROcodone-acetaminophen (NORCO) 10-325 MG tablet Take 1 tablet by mouth 2 (two) times daily as needed for  moderate pain.  . potassium chloride SA (KLOR-CON) 20 MEQ tablet Take 1 tablet (20 mEq total) by mouth daily.  . rosuvastatin (CRESTOR) 5 MG tablet Take 1 tablet (5 mg total) by mouth daily.  . sacubitril-valsartan (ENTRESTO) 24-26 MG Take 1 tablet by mouth 2 (two) times daily.  Marland Kitchen spironolactone (ALDACTONE) 25 MG tablet Take 0.5 tablets (12.5 mg total) by mouth daily.  . [DISCONTINUED] furosemide (LASIX) 40 MG tablet Take 1 tablet (40 mg total) by mouth daily.  . [DISCONTINUED] metoprolol tartrate (LOPRESSOR) 50 MG tablet Take 1 tablet (50 mg total) by mouth 2 (two) times daily.     Allergies:   Patient has no known allergies.   Social History   Socioeconomic History  . Marital status: Married    Spouse name: Not on file  . Number of  children: Not on file  . Years of education: Not on file  . Highest education level: Not on file  Occupational History  . Not on file  Tobacco Use  . Smoking status: Former Smoker    Packs/day: 0.25    Years: 3.00    Pack years: 0.75    Types: Cigarettes    Quit date: 08/17/2011    Years since quitting: 8.4  . Smokeless tobacco: Never Used  Vaping Use  . Vaping Use: Never used  Substance and Sexual Activity  . Alcohol use: Yes    Alcohol/week: 1.0 standard drink    Types: 1 Glasses of wine per week    Comment: occasional wine  . Drug use: No  . Sexual activity: Yes  Other Topics Concern  . Not on file  Social History Narrative  . Not on file   Social Determinants of Health   Financial Resource Strain:   . Difficulty of Paying Living Expenses:   Food Insecurity:   . Worried About Programme researcher, broadcasting/film/video in the Last Year:   . Barista in the Last Year:   Transportation Needs:   . Freight forwarder (Medical):   Marland Kitchen Lack of Transportation (Non-Medical):   Physical Activity:   . Days of Exercise per Week:   . Minutes of Exercise per Session:   Stress:   . Feeling of Stress :   Social Connections:   . Frequency of Communication  with Friends and Family:   . Frequency of Social Gatherings with Friends and Family:   . Attends Religious Services:   . Active Member of Clubs or Organizations:   . Attends Banker Meetings:   Marland Kitchen Marital Status:      Family History: The patient's family history is not on file.  ROS:   Please see the history of present illness.    ROS  All other systems reviewed and negative.   EKGs/Labs/Other Studies Reviewed:    The following studies were reviewed today: 2D echo DCCV op note  EKG:  EKG is not ordered today.    Recent Labs: 11/05/2019: B Natriuretic Peptide 318.7; TSH 1.067 11/06/2019: ALT 21 11/14/2019: Magnesium 2.3 12/20/2019: Hemoglobin 14.9; Platelets 182 12/27/2019: BUN 13; Creatinine, Ser 1.07; Potassium 4.6; Sodium 137   Recent Lipid Panel    Component Value Date/Time   CHOL 103 11/07/2019 0500   TRIG 36 11/07/2019 0500   HDL 40 (L) 11/07/2019 0500   CHOLHDL 2.6 11/07/2019 0500   VLDL 7 11/07/2019 0500   LDLCALC 56 11/07/2019 0500    Physical Exam:    VS:  BP 90/60   Pulse (!) 58   Ht 6\' 1"  (1.854 m)   Wt 246 lb (111.6 kg)   SpO2 98%   BMI 32.46 kg/m     Wt Readings from Last 3 Encounters:  02/04/20 246 lb (111.6 kg)  01/04/20 243 lb 9.6 oz (110.5 kg)  12/28/19 245 lb 3.2 oz (111.2 kg)     GEN:  Well nourished, well developed in no acute distress HEENT: Normal NECK: No JVD; No carotid bruits LYMPHATICS: No lymphadenopathy CARDIAC: RRR, no murmurs, rubs, gallops RESPIRATORY:  Clear to auscultation without rales, wheezing or rhonchi  ABDOMEN: Soft, non-tender, non-distended MUSCULOSKELETAL:  No edema; No deformity  SKIN: Warm and dry NEUROLOGIC:  Alert and oriented x 3 PSYCHIATRIC:  Normal affect   ASSESSMENT:    1. Paroxysmal atrial fibrillation (HCC)   2. Chronic systolic heart failure (HCC)  3. Brain aneurysm   4. Coronary artery disease due to lipid rich plaque   5. Hyperlipidemia LDL goal <70   6. Chronic systolic  (congestive) heart failure (HCC)   7. PAF (paroxysmal atrial fibrillation) (HCC)   8. Cerebral aneurysm   9. Pure hypercholesterolemia    PLAN:    In order of problems listed above:  1. Atrial fibrillation: -This was found during his recent hospitalization and started on Amio 200mg  BID for rate control.  -s/p DCCV to NSR 01/04/20 -now followed in afib clinic and maintaining NSR -they are recommending remaining on Amio for now with plans in the future to transition to a different AAD given his young age -continue apixaban 5mg  BID -now on Amio 200mg  daily -continue low dose BB  2. Chronic Systolic heart failure: -EF noted to be 20-25% during prior admission.  -repeat 2D echo after DCCV shows no improvement in EF at 20-25% with anterior and apical wall motion abnormalities. -Cardiac MRI 12/2019 showed EF 22% with severe LVE and no viability in segments supplied by mid and apical LAD (mid anterior and apical septal, anterior, interior walls and in the true apex - total of 5segments).  There was also moderate RV dysfunction,  -he appears euvolemic on exam today -BP is soft and he is bradycardic -change Lopressor to Toprol XL 25mg  daily (decreased dose due to soft BP and brady) -continue Entresto 24-26mg  BID and Spiro 12.5mg  daily -decrease lasix to 20mg  daily due to soft BP -I have stressed the importance of ETOH avoidance.  He has drank ETOH in the past and unclear the amount but several drinks a day in the past.  He has evidence of biventricular dysfunction so ? Whether he has a component of alcohol toxicity.  His LVF did not improve with restoring NSR.  -I have recommended referral to AHF service to see if he would benefit from advanced therapies given that he is on max dose HF meds that his BP and HR will allow and feels fatigued as well as SOB -he had an appt with Dr. Ladona Ridgelaylor to discuss ICD implant next month.    3. Cerebral aneurysm: -Status post embolization using web device.   -continue ASA 81mg  daily per Interventional neurorads  4. CAD: -s/p cath during last hospitalization showing CTO of the LAD.  -no viability in segments supplied by mid and apical LAD (mid anterior and apical septal, anterior, interior walls and in the true apex - total of 5segments). -denies any angina -continue statin, BB and ASA  5.  HLD -LDL goal < 70 -continue Crestor 5mg  daily -LDL 56 on labs in March  I have spent a total of 40 minutes with patient reviewing cardiac MRI and echo with patient and wife , EKGs, labs and examining patient as well as establishing an assessment and plan that was discussed with the patient.  > 50% of time was spent in direct patient care.     Medication Adjustments/Labs and Tests Ordered: Current medicines are reviewed at length with the patient today.  Concerns regarding medicines are outlined above.  Orders Placed This Encounter  Procedures  . AMB referral to CHF clinic   Meds ordered this encounter  Medications  . metoprolol succinate (TOPROL XL) 25 MG 24 hr tablet    Sig: Take 1 tablet (25 mg total) by mouth daily.    Dispense:  90 tablet    Refill:  3  . furosemide (LASIX) 20 MG tablet    Sig: Take 1  tablet (20 mg total) by mouth daily.    Dispense:  90 tablet    Refill:  3    Signed, Fransico Him, MD  02/04/2020 1:04 PM    [place]

## 2020-02-04 NOTE — Patient Instructions (Addendum)
Dr. Mayford Knife would like for you to weigh yourself daily, first thing in the morning and keep a log of your weights.   Medication Instructions:  Your physician has recommended you make the following change in your medication:  1) STOP taking Lopressor (metoprolol tartrate) 2) START taking Toprol XL (metoprolol succinate) 3) DECREASE Lasix (furosemide) to 20 mg daily  *If you need a refill on your cardiac medications before your next appointment, please call your pharmacy*   Lab Work:  If you have labs (blood work) drawn today and your tests are completely normal, you will receive your results only by: Marland Kitchen MyChart Message (if you have MyChart) OR . A paper copy in the mail If you have any lab test that is abnormal or we need to change your treatment, we will call you to review the results.   Follow-Up: At [facility], you and your health needs are our priority.  As part of our continuing mission to provide you with exceptional heart care, we have created designated Provider Care Teams.  These Care Teams include your primary Cardiologist (physician) and Advanced Practice Providers (APPs -  Physician Assistants and Nurse Practitioners) who all work together to provide you with the care you need, when you need it.  We recommend signing up for the patient portal called "MyChart".  Sign up information is provided on this After Visit Summary.  MyChart is used to connect with patients for Virtual Visits (Telemedicine).  Patients are able to view lab/test results, encounter notes, upcoming appointments, etc.  Non-urgent messages can be sent to your provider as well.   To learn more about what you can do with MyChart, go to [email or web address].    Your next appointment:   3 month(s)  The format for your next appointment:   In Person  Provider:   You may see Armanda Magic, MD or one of the following Advanced Practice Providers on your designated Care Team:    Ronie Spies, PA-C  Jacolyn Reedy, PA-C  Other Instructions: You have been referred to the Advanced Heart Failure Clinic.

## 2020-02-06 NOTE — Telephone Encounter (Signed)
  Are you open to seeing patient as new patient

## 2020-02-09 ENCOUNTER — Other Ambulatory Visit: Payer: Self-pay | Admitting: Physician Assistant

## 2020-02-15 ENCOUNTER — Telehealth: Payer: Self-pay

## 2020-02-15 NOTE — Telephone Encounter (Signed)
-----   Message from Marinus Maw, MD sent at 02/06/2020  9:51 PM EDT ----- Severe LV dysfunction, unchanged from echo in March. ICD would be indicated.

## 2020-02-15 NOTE — Telephone Encounter (Signed)
Pt has f/u appt with Dr. Ladona Ridgel on February 28, 2020 to discuss ICD

## 2020-02-28 ENCOUNTER — Ambulatory Visit (INDEPENDENT_AMBULATORY_CARE_PROVIDER_SITE_OTHER): Payer: Commercial Managed Care - PPO | Admitting: Internal Medicine

## 2020-02-28 ENCOUNTER — Encounter: Payer: Self-pay | Admitting: Internal Medicine

## 2020-02-28 ENCOUNTER — Other Ambulatory Visit: Payer: Self-pay

## 2020-02-28 VITALS — BP 110/78 | HR 84 | Ht 73.0 in | Wt 235.0 lb

## 2020-02-28 DIAGNOSIS — I5022 Chronic systolic (congestive) heart failure: Secondary | ICD-10-CM | POA: Diagnosis not present

## 2020-02-28 LAB — CBC WITH DIFFERENTIAL/PLATELET
Basophils Absolute: 0 10*3/uL (ref 0.0–0.2)
Basos: 0 %
EOS (ABSOLUTE): 0.2 10*3/uL (ref 0.0–0.4)
Eos: 2 %
Hematocrit: 44.6 % (ref 37.5–51.0)
Hemoglobin: 15 g/dL (ref 13.0–17.7)
Immature Grans (Abs): 0 10*3/uL (ref 0.0–0.1)
Immature Granulocytes: 0 %
Lymphocytes Absolute: 1.8 10*3/uL (ref 0.7–3.1)
Lymphs: 24 %
MCH: 31.8 pg (ref 26.6–33.0)
MCHC: 33.6 g/dL (ref 31.5–35.7)
MCV: 95 fL (ref 79–97)
Monocytes Absolute: 0.8 10*3/uL (ref 0.1–0.9)
Monocytes: 10 %
Neutrophils Absolute: 4.9 10*3/uL (ref 1.4–7.0)
Neutrophils: 64 %
Platelets: 260 10*3/uL (ref 150–450)
RBC: 4.71 x10E6/uL (ref 4.14–5.80)
RDW: 14.2 % (ref 11.6–15.4)
WBC: 7.7 10*3/uL (ref 3.4–10.8)

## 2020-02-28 LAB — BASIC METABOLIC PANEL
BUN/Creatinine Ratio: 12 (ref 10–24)
BUN: 12 mg/dL (ref 8–27)
CO2: 24 mmol/L (ref 20–29)
Calcium: 9.6 mg/dL (ref 8.6–10.2)
Chloride: 98 mmol/L (ref 96–106)
Creatinine, Ser: 0.99 mg/dL (ref 0.76–1.27)
GFR calc Af Amer: 95 mL/min/{1.73_m2} (ref >59)
GFR calc non Af Amer: 82 mL/min/{1.73_m2} (ref >59)
Glucose: 101 mg/dL — ABNORMAL HIGH (ref 65–99)
Potassium: 5 mmol/L (ref 3.5–5.2)
Sodium: 136 mmol/L (ref 134–144)

## 2020-02-28 NOTE — Progress Notes (Signed)
HPI [name] returns today for followup. He is a pleasant 64 yo man with a h/o CAD ,s/p MI and PAF on amiodarone. He has not been vaccinated. He denies chest pain and has class 2, approaching 3 CHF. He admits to some non-compliance. He denies palpitations.  No Known Allergies   Current Outpatient Medications  Medication Sig Dispense Refill  . amiodarone (PACERONE) 200 MG tablet TAKE 1 TABLET(200 MG) BY MOUTH DAILY 30 tablet 11  . apixaban (ELIQUIS) 5 MG TABS tablet Take 1 tablet (5 mg total) by mouth 2 (two) times daily. 60 tablet 5  . aspirin EC 81 MG tablet Take 81 mg by mouth daily.    . colchicine 0.6 MG tablet Take 0.6 mg by mouth 2 (two) times daily as needed (for gout flares).     . diclofenac sodium (VOLTAREN) 1 % GEL Apply 2 g topically 2 (two) times daily as needed (to affected sites- for arthritis pain).     . furosemide (LASIX) 20 MG tablet Take 1 tablet (20 mg total) by mouth daily. 90 tablet 3  . HYDROcodone-acetaminophen (NORCO) 10-325 MG tablet Take 1 tablet by mouth 2 (two) times daily as needed for moderate pain.    . metoprolol succinate (TOPROL XL) 25 MG 24 hr tablet Take 1 tablet (25 mg total) by mouth daily. 90 tablet 3  . potassium chloride SA (KLOR-CON) 20 MEQ tablet Take 1 tablet (20 mEq total) by mouth daily. 30 tablet 3  . rosuvastatin (CRESTOR) 5 MG tablet Take 1 tablet (5 mg total) by mouth daily. 30 tablet 3  . sacubitril-valsartan (ENTRESTO) 24-26 MG Take 1 tablet by mouth 2 (two) times daily. 60 tablet 6  . spironolactone (ALDACTONE) 25 MG tablet Take 0.5 tablets (12.5 mg total) by mouth daily. 45 tablet 3   No current facility-administered medications for this visit.     Past Medical History:  Diagnosis Date  . CAD (coronary artery disease), native coronary artery    chronically occluded mid LAD with left to left collaterals.   . Cerebral aneurysm    s/p embolization  . Chronic systolic (congestive) heart failure (HCC)    biventricular HF  with moderate RV dysfunction and severe LV dysfunction by Cardiac MRI with EF 22% no viability in the apical septal/apical/inferior,mid anterior and true apex.    . Gout   . HLD (hyperlipidemia)   . PAF (paroxysmal atrial fibrillation) (HCC)    s/p DCCV 12/2019  . Pre-diabetes    diet controlled, no meds    ROS:   All systems reviewed and negative except as noted in the HPI.   Past Surgical History:  Procedure Laterality Date  . CARDIOVERSION N/A 12/28/2019   Procedure: CARDIOVERSION;  Surgeon: Chilton Si, MD;  Location: [facility] ENDOSCOPY;  Service: Cardiovascular;  Laterality: N/A;  . COLONOSCOPY    . INSERTION OF MESH N/A 06/27/2015   Procedure: INSERTION OF MESH;  Surgeon: Axel Filler, MD;  Location: [facility] ORS;  Service: General;  Laterality: N/A;  . IR 3D INDEPENDENT WKST  11/14/2019  . IR 3D INDEPENDENT WKST  11/22/2019  . IR ANGIO INTRA EXTRACRAN SEL COM CAROTID INNOMINATE BILAT MOD SED  11/14/2019  . IR ANGIO INTRA EXTRACRAN SEL INTERNAL CAROTID UNI R MOD SED  11/22/2019  . IR ANGIO VERTEBRAL SEL VERTEBRAL BILAT MOD SED  11/14/2019  . IR ANGIOGRAM FOLLOW UP STUDY  11/22/2019  . IR CT HEAD LTD  11/22/2019  . IR NEURO EACH ADD'L AFTER  BASIC UNI RIGHT (MS)  11/22/2019  . IR TRANSCATH/EMBOLIZ  11/22/2019  . laceration to fingers     left hand  . MOUTH SURGERY     secondary to abscess   . RADIOLOGY WITH ANESTHESIA N/A 11/22/2019   Procedure: EMBOLIZATION;  Surgeon: Julieanne Cotton, MD;  Location: [facility] OR;  Service: Radiology;  Laterality: N/A;  . RIGHT/LEFT HEART CATH AND CORONARY ANGIOGRAPHY N/A 11/12/2019   Procedure: RIGHT/LEFT HEART CATH AND CORONARY ANGIOGRAPHY;  Surgeon: Corky Crafts, MD;  Location: [facility] INVASIVE CV LAB;  Service: Cardiovascular;  Laterality: N/A;  . trauma to lip      sutured  . VENTRAL HERNIA REPAIR N/A 06/27/2015   Procedure: LAPAROSCOPIC VENTRAL HERNIA;  Surgeon: Axel Filler, MD;  Location: [facility] ORS;  Service: General;  Laterality: N/A;     History  reviewed. No pertinent family history.   Social History   Socioeconomic History  . Marital status: Married    Spouse name: Not on file  . Number of children: Not on file  . Years of education: Not on file  . Highest education level: Not on file  Occupational History  . Not on file  Tobacco Use  . Smoking status: Former Smoker    Packs/day: 0.25    Years: 3.00    Pack years: 0.75    Types: Cigarettes    Quit date: 08/17/2011    Years since quitting: 8.5  . Smokeless tobacco: Never Used  Vaping Use  . Vaping Use: Never used  Substance and Sexual Activity  . Alcohol use: Yes    Alcohol/week: 1.0 standard drink    Types: 1 Glasses of wine per week    Comment: occasional wine  . Drug use: No  . Sexual activity: Yes  Other Topics Concern  . Not on file  Social History Narrative  . Not on file   Social Determinants of Health   Financial Resource Strain:   . Difficulty of Paying Living Expenses:   Food Insecurity:   . Worried About Programme researcher, broadcasting/film/video in the Last Year:   . Barista in the Last Year:   Transportation Needs:   . Freight forwarder (Medical):   Marland Kitchen Lack of Transportation (Non-Medical):   Physical Activity:   . Days of Exercise per Week:   . Minutes of Exercise per Session:   Stress:   . Feeling of Stress :   Social Connections:   . Frequency of Communication with Friends and Family:   . Frequency of Social Gatherings with Friends and Family:   . Attends Religious Services:   . Active Member of Clubs or Organizations:   . Attends Banker Meetings:   Marland Kitchen Marital Status:   Intimate Partner Violence:   . Fear of Current or Ex-Partner:   . Emotionally Abused:   Marland Kitchen Physically Abused:   . Sexually Abused:      BP 110/78   Pulse 84   Ht 6\' 1"  (1.854 m)   Wt 235 lb (106.6 kg)   SpO2 91%   BMI 31.00 kg/m   Physical Exam:  Well appearing NAD HEENT: Unremarkable Neck:  No JVD, no thyromegally Lymphatics:  No adenopathy Back:   No CVA tenderness Lungs:  Clear HEART:  Regular rate rhythm, no murmurs, no rubs, no clicks Abd:  soft, positive bowel sounds, no organomegally, no rebound, no guarding Ext:  2 plus pulses, no edema, no cyanosis, no clubbing Skin:  No rashes no nodules Neuro:  CN II through XII intact, motor grossly intact  EKG - NSR with RBBB   Assess/Plan: 1. ICM - he has an occluded LAD with no viability. His EF is 25%. He has been on maximal medical therapy but he does admit to occaisionally missing a dose or two of his meds. He denies anginal symptoms.  We discussed insertion of an ICD and I discussed the indictions/risks/benefits/goals/expectations and he will call us if he wishes to proceed. 2. Chronic systolic heart failure - he has class 2-3 symptoms. He will continue his meds and a low sodium diet. 3. PAF - he is on amiodarone. I would imagine weaning the dose down over time. 4. HTN - his bp is well controlled.   Gaspar Fowle,M.D. 

## 2020-02-28 NOTE — Patient Instructions (Addendum)
Medication Instructions:  Your physician recommends that you continue on your current medications as directed. Please refer to the Current Medication list given to you today.  Labwork: You will get lab work today:  BMP and CBC  Testing/Procedures: None ordered.  Follow-Up:  SEE INSTRUCTION LETTER   Any Other Special Instructions Will Be Listed Below (If Applicable).  If you need a refill on your cardiac medications before your next appointment, please call your pharmacy.    Cardioverter Defibrillator Implantation  An implantable cardioverter defibrillator (ICD) is a small device that is placed under the skin in the chest or abdomen. An ICD consists of a battery, a small computer (pulse generator), and wires (leads) that go into the heart. An ICD is used to detect and correct two types of dangerous irregular heartbeats (arrhythmias):  A rapid heart rhythm (tachycardia).  An arrhythmia in which the lower chambers of the heart (ventricles) contract in an uncoordinated way (fibrillation). When an ICD detects tachycardia, it sends a low-energy shock to the heart to restore the heartbeat to normal (cardioversion). This signal is usually painless. If cardioversion does not work or if the ICD detects fibrillation, it delivers a high-energy shock to the heart (defibrillation) to restart the heart. This shock may feel like a strong jolt in the chest. Your health care provider may prescribe an ICD if:  You have had an arrhythmia that originated in the ventricles.  Your heart has been damaged by a disease or heart condition. Sometimes, ICDs are programmed to act as a device called a pacemaker. Pacemakers can be used to treat a slow heartbeat (bradycardia) or tachycardia by taking over the heart rate with electrical impulses. Tell a health care provider about:  Any allergies you have.  All medicines you are taking, including vitamins, herbs, eye drops, creams, and over-the-counter  medicines.  Any problems you or family members have had with anesthetic medicines.  Any blood disorders you have.  Any surgeries you have had.  Any medical conditions you have.  Whether you are pregnant or may be pregnant. What are the risks? Generally, this is a safe procedure. However, problems may occur, including:  Swelling, bleeding, or bruising.  Infection.  Blood clots.  Damage to other structures or organs, such as nerves, blood vessels, or the heart.  Allergic reactions to medicines used during the procedure. What happens before the procedure? Staying hydrated Follow instructions from your health care provider about hydration, which may include:  Up to 2 hours before the procedure - you may continue to drink clear liquids, such as water, clear fruit juice, black coffee, and plain tea. Eating and drinking restrictions Follow instructions from your health care provider about eating and drinking, which may include:  8 hours before the procedure - stop eating heavy meals or foods such as meat, fried foods, or fatty foods.  6 hours before the procedure - stop eating light meals or foods, such as toast or cereal.  6 hours before the procedure - stop drinking milk or drinks that contain milk.  2 hours before the procedure - stop drinking clear liquids. Medicine Ask your health care provider about:  Changing or stopping your normal medicines. This is important if you take diabetes medicines or blood thinners.  Taking medicines such as aspirin and ibuprofen. These medicines can thin your blood. Do not take these medicines before your procedure if your doctor tells you not to. Tests  You may have blood tests.  You may have a test to  check the electrical signals in your heart (electrocardiogram, ECG).  You may have imaging tests, such as a chest X-ray. General instructions  For 24 hours before the procedure, stop using products that contain nicotine or tobacco, such  as cigarettes and e-cigarettes. If you need help quitting, ask your health care provider.  Plan to have someone take you home from the hospital or clinic.  You may be asked to shower with a germ-killing soap. What happens during the procedure?  To reduce your risk of infection: ? Your health care team will wash or sanitize their hands. ? Your skin will be washed with soap. ? Hair may be removed from the surgical area.  Small monitors will be put on your body. They will be used to check your heart, blood pressure, and oxygen level.  An IV tube will be inserted into one of your veins.  You will be given one or more of the following: ? A medicine to help you relax (sedative). ? A medicine to numb the area (local anesthetic). ? A medicine to make you fall asleep (general anesthetic).  Leads will be guided through a blood vessel into your heart and attached to your heart muscles. Depending on the ICD, the leads may go into one ventricle or they may go into both ventricles and into an upper chamber of the heart. An X-ray machine (fluoroscope) will be usedto help guide the leads.  A small incision will be made to create a deep pocket under your skin.  The pulse generator will be placed into the pocket.  The ICD will be tested.  The incision will be closed with stitches (sutures), skin glue, or staples.  A bandage (dressing) will be placed over the incision. This procedure may vary among health care providers and hospitals. What happens after the procedure?  Your blood pressure, heart rate, breathing rate, and blood oxygen level will be monitored often until the medicines you were given have worn off.  A chest X-ray will be taken to check that the ICD is in the right place.  You will need to stay in the hospital for 1-2 days so your health care provider can make sure your ICD is working.  Do not drive for 24 hours if you received a sedative. Ask your health care provider when it is  safe for you to drive.  You may be given an identification card explaining that you have an ICD. Summary  An implantable cardioverter defibrillator (ICD) is a small device that is placed under the skin in the chest or abdomen. It is used to detect and correct dangerous irregular heartbeats (arrhythmias).  An ICD consists of a battery, a small computer (pulse generator), and wires (leads) that go into the heart.  When an ICD detects rapid heart rhythm (tachycardia), it sends a low-energy shock to the heart to restore the heartbeat to normal (cardioversion). If cardioversion does not work or if the ICD detects uncoordinated heart contractions (fibrillation), it delivers a high-energy shock to the heart (defibrillation) to restart the heart.  You will need to stay in the hospital for 1-2 days to make sure your ICD is working. This information is not intended to replace advice given to you by your health care provider. Make sure you discuss any questions you have with your health care provider. Document Revised: 07/15/2017 Document Reviewed: 08/11/2016 Elsevier Patient Education  2020 [employer].

## 2020-02-28 NOTE — H&P (View-Only) (Signed)
HPI Richard Graham returns today for followup. He is a pleasant 64 yo man with a h/o CAD ,s/p MI and PAF on amiodarone. He has not been vaccinated. He denies chest pain and has class 2, approaching 3 CHF. He admits to some non-compliance. He denies palpitations.  No Known Allergies   Current Outpatient Medications  Medication Sig Dispense Refill  . amiodarone (PACERONE) 200 MG tablet TAKE 1 TABLET(200 MG) BY MOUTH DAILY 30 tablet 11  . apixaban (ELIQUIS) 5 MG TABS tablet Take 1 tablet (5 mg total) by mouth 2 (two) times daily. 60 tablet 5  . aspirin EC 81 MG tablet Take 81 mg by mouth daily.    . colchicine 0.6 MG tablet Take 0.6 mg by mouth 2 (two) times daily as needed (for gout flares).     . diclofenac sodium (VOLTAREN) 1 % GEL Apply 2 g topically 2 (two) times daily as needed (to affected sites- for arthritis pain).     . furosemide (LASIX) 20 MG tablet Take 1 tablet (20 mg total) by mouth daily. 90 tablet 3  . HYDROcodone-acetaminophen (NORCO) 10-325 MG tablet Take 1 tablet by mouth 2 (two) times daily as needed for moderate pain.    . metoprolol succinate (TOPROL XL) 25 MG 24 hr tablet Take 1 tablet (25 mg total) by mouth daily. 90 tablet 3  . potassium chloride SA (KLOR-CON) 20 MEQ tablet Take 1 tablet (20 mEq total) by mouth daily. 30 tablet 3  . rosuvastatin (CRESTOR) 5 MG tablet Take 1 tablet (5 mg total) by mouth daily. 30 tablet 3  . sacubitril-valsartan (ENTRESTO) 24-26 MG Take 1 tablet by mouth 2 (two) times daily. 60 tablet 6  . spironolactone (ALDACTONE) 25 MG tablet Take 0.5 tablets (12.5 mg total) by mouth daily. 45 tablet 3   No current facility-administered medications for this visit.     Past Medical History:  Diagnosis Date  . CAD (coronary artery disease), native coronary artery    chronically occluded mid LAD with left to left collaterals.   . Cerebral aneurysm    s/p embolization  . Chronic systolic (congestive) heart failure (HCC)    biventricular HF  with moderate RV dysfunction and severe LV dysfunction by Cardiac MRI with EF 22% no viability in the apical septal/apical/inferior,mid anterior and true apex.    . Gout   . HLD (hyperlipidemia)   . PAF (paroxysmal atrial fibrillation) (HCC)    s/p DCCV 12/2019  . Pre-diabetes    diet controlled, no meds    ROS:   All systems reviewed and negative except as noted in the HPI.   Past Surgical History:  Procedure Laterality Date  . CARDIOVERSION N/A 12/28/2019   Procedure: CARDIOVERSION;  Surgeon: Chilton Si, MD;  Location: [facility] ENDOSCOPY;  Service: Cardiovascular;  Laterality: N/A;  . COLONOSCOPY    . INSERTION OF MESH N/A 06/27/2015   Procedure: INSERTION OF MESH;  Surgeon: Axel Filler, MD;  Location: [facility] ORS;  Service: General;  Laterality: N/A;  . IR 3D INDEPENDENT WKST  11/14/2019  . IR 3D INDEPENDENT WKST  11/22/2019  . IR ANGIO INTRA EXTRACRAN SEL COM CAROTID INNOMINATE BILAT MOD SED  11/14/2019  . IR ANGIO INTRA EXTRACRAN SEL INTERNAL CAROTID UNI R MOD SED  11/22/2019  . IR ANGIO VERTEBRAL SEL VERTEBRAL BILAT MOD SED  11/14/2019  . IR ANGIOGRAM FOLLOW UP STUDY  11/22/2019  . IR CT HEAD LTD  11/22/2019  . IR NEURO EACH ADD'L AFTER  BASIC UNI RIGHT (MS)  11/22/2019  . IR TRANSCATH/EMBOLIZ  11/22/2019  . laceration to fingers     left hand  . MOUTH SURGERY     secondary to abscess   . RADIOLOGY WITH ANESTHESIA N/A 11/22/2019   Procedure: EMBOLIZATION;  Surgeon: Julieanne Cotton, MD;  Location: [facility] OR;  Service: Radiology;  Laterality: N/A;  . RIGHT/LEFT HEART CATH AND CORONARY ANGIOGRAPHY N/A 11/12/2019   Procedure: RIGHT/LEFT HEART CATH AND CORONARY ANGIOGRAPHY;  Surgeon: Corky Crafts, MD;  Location: [facility] INVASIVE CV LAB;  Service: Cardiovascular;  Laterality: N/A;  . trauma to lip      sutured  . VENTRAL HERNIA REPAIR N/A 06/27/2015   Procedure: LAPAROSCOPIC VENTRAL HERNIA;  Surgeon: Axel Filler, MD;  Location: [facility] ORS;  Service: General;  Laterality: N/A;     History  reviewed. No pertinent family history.   Social History   Socioeconomic History  . Marital status: Married    Spouse name: Not on file  . Number of children: Not on file  . Years of education: Not on file  . Highest education level: Not on file  Occupational History  . Not on file  Tobacco Use  . Smoking status: Former Smoker    Packs/day: 0.25    Years: 3.00    Pack years: 0.75    Types: Cigarettes    Quit date: 08/17/2011    Years since quitting: 8.5  . Smokeless tobacco: Never Used  Vaping Use  . Vaping Use: Never used  Substance and Sexual Activity  . Alcohol use: Yes    Alcohol/week: 1.0 standard drink    Types: 1 Glasses of wine per week    Comment: occasional wine  . Drug use: No  . Sexual activity: Yes  Other Topics Concern  . Not on file  Social History Narrative  . Not on file   Social Determinants of Health   Financial Resource Strain:   . Difficulty of Paying Living Expenses:   Food Insecurity:   . Worried About Programme researcher, broadcasting/film/video in the Last Year:   . Barista in the Last Year:   Transportation Needs:   . Freight forwarder (Medical):   Marland Kitchen Lack of Transportation (Non-Medical):   Physical Activity:   . Days of Exercise per Week:   . Minutes of Exercise per Session:   Stress:   . Feeling of Stress :   Social Connections:   . Frequency of Communication with Friends and Family:   . Frequency of Social Gatherings with Friends and Family:   . Attends Religious Services:   . Active Member of Clubs or Organizations:   . Attends Banker Meetings:   Marland Kitchen Marital Status:   Intimate Partner Violence:   . Fear of Current or Ex-Partner:   . Emotionally Abused:   Marland Kitchen Physically Abused:   . Sexually Abused:      BP 110/78   Pulse 84   Ht 6\' 1"  (1.854 m)   Wt 235 lb (106.6 kg)   SpO2 91%   BMI 31.00 kg/m   Physical Exam:  Well appearing NAD HEENT: Unremarkable Neck:  No JVD, no thyromegally Lymphatics:  No adenopathy Back:   No CVA tenderness Lungs:  Clear HEART:  Regular rate rhythm, no murmurs, no rubs, no clicks Abd:  soft, positive bowel sounds, no organomegally, no rebound, no guarding Ext:  2 plus pulses, no edema, no cyanosis, no clubbing Skin:  No rashes no nodules Neuro:  CN II through XII intact, motor grossly intact  EKG - NSR with RBBB   Assess/Plan: 1. ICM - he has an occluded LAD with no viability. His EF is 25%. He has been on maximal medical therapy but he does admit to occaisionally missing a dose or two of his meds. He denies anginal symptoms.  We discussed insertion of an ICD and I discussed the indictions/risks/benefits/goals/expectations and he will call us if he wishes to proceed. 2. Chronic systolic heart failure - he has class 2-3 symptoms. He will continue his meds and a low sodium diet. 3. PAF - he is on amiodarone. I would imagine weaning the dose down over time. 4. HTN - his bp is well controlled.   Leonia Reeves.D.

## 2020-03-05 NOTE — Telephone Encounter (Signed)
Unfortunately, I'm not able to accept any more new patients at this time.  I'm sorry! Thank you!  

## 2020-03-06 NOTE — Telephone Encounter (Signed)
   Scheduler left message advising patient Dr Posey Rea unable to establish

## 2020-03-17 ENCOUNTER — Telehealth: Payer: Self-pay | Admitting: Internal Medicine

## 2020-03-17 NOTE — Telephone Encounter (Signed)
New Message  Patient is calling in because he is supposed to be getting an ICD implant on 03/20/20. Patient would like to know if he is supposed to get COVID tested prior. Please call and advise.

## 2020-03-17 NOTE — Telephone Encounter (Signed)
Call returned to Pt.  Advised he did not need a covid test.

## 2020-03-19 ENCOUNTER — Telehealth: Payer: Self-pay | Admitting: Cardiology

## 2020-03-19 MED ORDER — ROSUVASTATIN CALCIUM 5 MG PO TABS: 5.0000 mg | ORAL_TABLET | Freq: Every day | ORAL | 3 refills | Status: DC

## 2020-03-19 MED ORDER — POTASSIUM CHLORIDE CRYS ER 20 MEQ PO TBCR: 20.0000 meq | EXTENDED_RELEASE_TABLET | Freq: Every day | ORAL | 3 refills | Status: DC

## 2020-03-19 NOTE — Telephone Encounter (Signed)
*  STAT* If patient is at the pharmacy, call can be transferred to refill team.   1. Which medications need to be refilled? (please list name of each medication and dose if known)  metoprolol succinate (TOPROL XL) 25 MG 24 hr tablet rosuvastatin (CRESTOR) 5 MG tablet potassium chloride SA (KLOR-CON) 20 MEQ tablet  2. Which pharmacy/location (including street and city if local pharmacy) is medication to be sent to?  [facility] [phone] - [place], [place] - [address]  3. Do they need a 30 day or 90 day supply? 90   Patient says the doctor in the hospital prescribed these medications and needs them to be written by Dr. Mayford Knife.  Patient is out of medication

## 2020-03-19 NOTE — Telephone Encounter (Signed)
Pt's medications were sent to pt's pharmacy as requested. Confirmation received.  

## 2020-03-20 ENCOUNTER — Other Ambulatory Visit: Payer: Self-pay

## 2020-03-20 ENCOUNTER — Ambulatory Visit (HOSPITAL_COMMUNITY)
Admission: RE | Disposition: A | Discharge status: Home / Self Care | Payer: Commercial Managed Care - PPO | Source: Home / Self Care | Attending: Internal Medicine

## 2020-03-20 ENCOUNTER — Ambulatory Visit (HOSPITAL_COMMUNITY)
Admission: RE | Admit: 2020-03-20 | Discharge: 2020-03-20 | Disposition: A | Discharge status: Home / Self Care | Payer: Commercial Managed Care - PPO | Attending: Internal Medicine | Admitting: Internal Medicine

## 2020-03-20 ENCOUNTER — Ambulatory Visit (HOSPITAL_COMMUNITY): Payer: Commercial Managed Care - PPO

## 2020-03-20 DIAGNOSIS — Z7982 Long term (current) use of aspirin: Secondary | ICD-10-CM | POA: Insufficient documentation

## 2020-03-20 DIAGNOSIS — I2510 Atherosclerotic heart disease of native coronary artery without angina pectoris: Secondary | ICD-10-CM | POA: Insufficient documentation

## 2020-03-20 DIAGNOSIS — I5022 Chronic systolic (congestive) heart failure: Secondary | ICD-10-CM | POA: Diagnosis not present

## 2020-03-20 DIAGNOSIS — I480 Paroxysmal atrial fibrillation: Secondary | ICD-10-CM | POA: Diagnosis not present

## 2020-03-20 DIAGNOSIS — I252 Old myocardial infarction: Secondary | ICD-10-CM | POA: Insufficient documentation

## 2020-03-20 DIAGNOSIS — I110 Hypertensive heart disease with heart failure: Secondary | ICD-10-CM | POA: Insufficient documentation

## 2020-03-20 DIAGNOSIS — Z7901 Long term (current) use of anticoagulants: Secondary | ICD-10-CM | POA: Insufficient documentation

## 2020-03-20 DIAGNOSIS — M109 Gout, unspecified: Secondary | ICD-10-CM | POA: Insufficient documentation

## 2020-03-20 DIAGNOSIS — Z006 Encounter for examination for normal comparison and control in clinical research program: Secondary | ICD-10-CM | POA: Diagnosis not present

## 2020-03-20 DIAGNOSIS — Z87891 Personal history of nicotine dependence: Secondary | ICD-10-CM | POA: Insufficient documentation

## 2020-03-20 DIAGNOSIS — I5082 Biventricular heart failure: Secondary | ICD-10-CM | POA: Insufficient documentation

## 2020-03-20 DIAGNOSIS — Z79899 Other long term (current) drug therapy: Secondary | ICD-10-CM | POA: Diagnosis not present

## 2020-03-20 DIAGNOSIS — I4892 Unspecified atrial flutter: Secondary | ICD-10-CM | POA: Diagnosis not present

## 2020-03-20 DIAGNOSIS — Z20822 Contact with and (suspected) exposure to covid-19: Secondary | ICD-10-CM | POA: Diagnosis not present

## 2020-03-20 DIAGNOSIS — E785 Hyperlipidemia, unspecified: Secondary | ICD-10-CM | POA: Insufficient documentation

## 2020-03-20 DIAGNOSIS — I255 Ischemic cardiomyopathy: Secondary | ICD-10-CM | POA: Diagnosis not present

## 2020-03-20 DIAGNOSIS — Z9119 Patient's noncompliance with other medical treatment and regimen: Secondary | ICD-10-CM | POA: Insufficient documentation

## 2020-03-20 DIAGNOSIS — Z95810 Presence of automatic (implantable) cardiac defibrillator: Secondary | ICD-10-CM

## 2020-03-20 DIAGNOSIS — I671 Cerebral aneurysm, nonruptured: Secondary | ICD-10-CM | POA: Insufficient documentation

## 2020-03-20 DIAGNOSIS — R7303 Prediabetes: Secondary | ICD-10-CM | POA: Insufficient documentation

## 2020-03-20 HISTORY — PX: ICD IMPLANT: EP1208

## 2020-03-20 LAB — SARS CORONAVIRUS 2 BY RT PCR (HOSPITAL ORDER, PERFORMED IN ~~LOC~~ HOSPITAL LAB): SARS Coronavirus 2: NEGATIVE

## 2020-03-20 IMAGING — CR DG CHEST 2V
2 series · 2 of 2 positions shown · non-contrast
Comparison: [DATE]

CLINICAL DATA: ICD in place.

EXAM:
CHEST - 2 VIEW

[w chest pa]
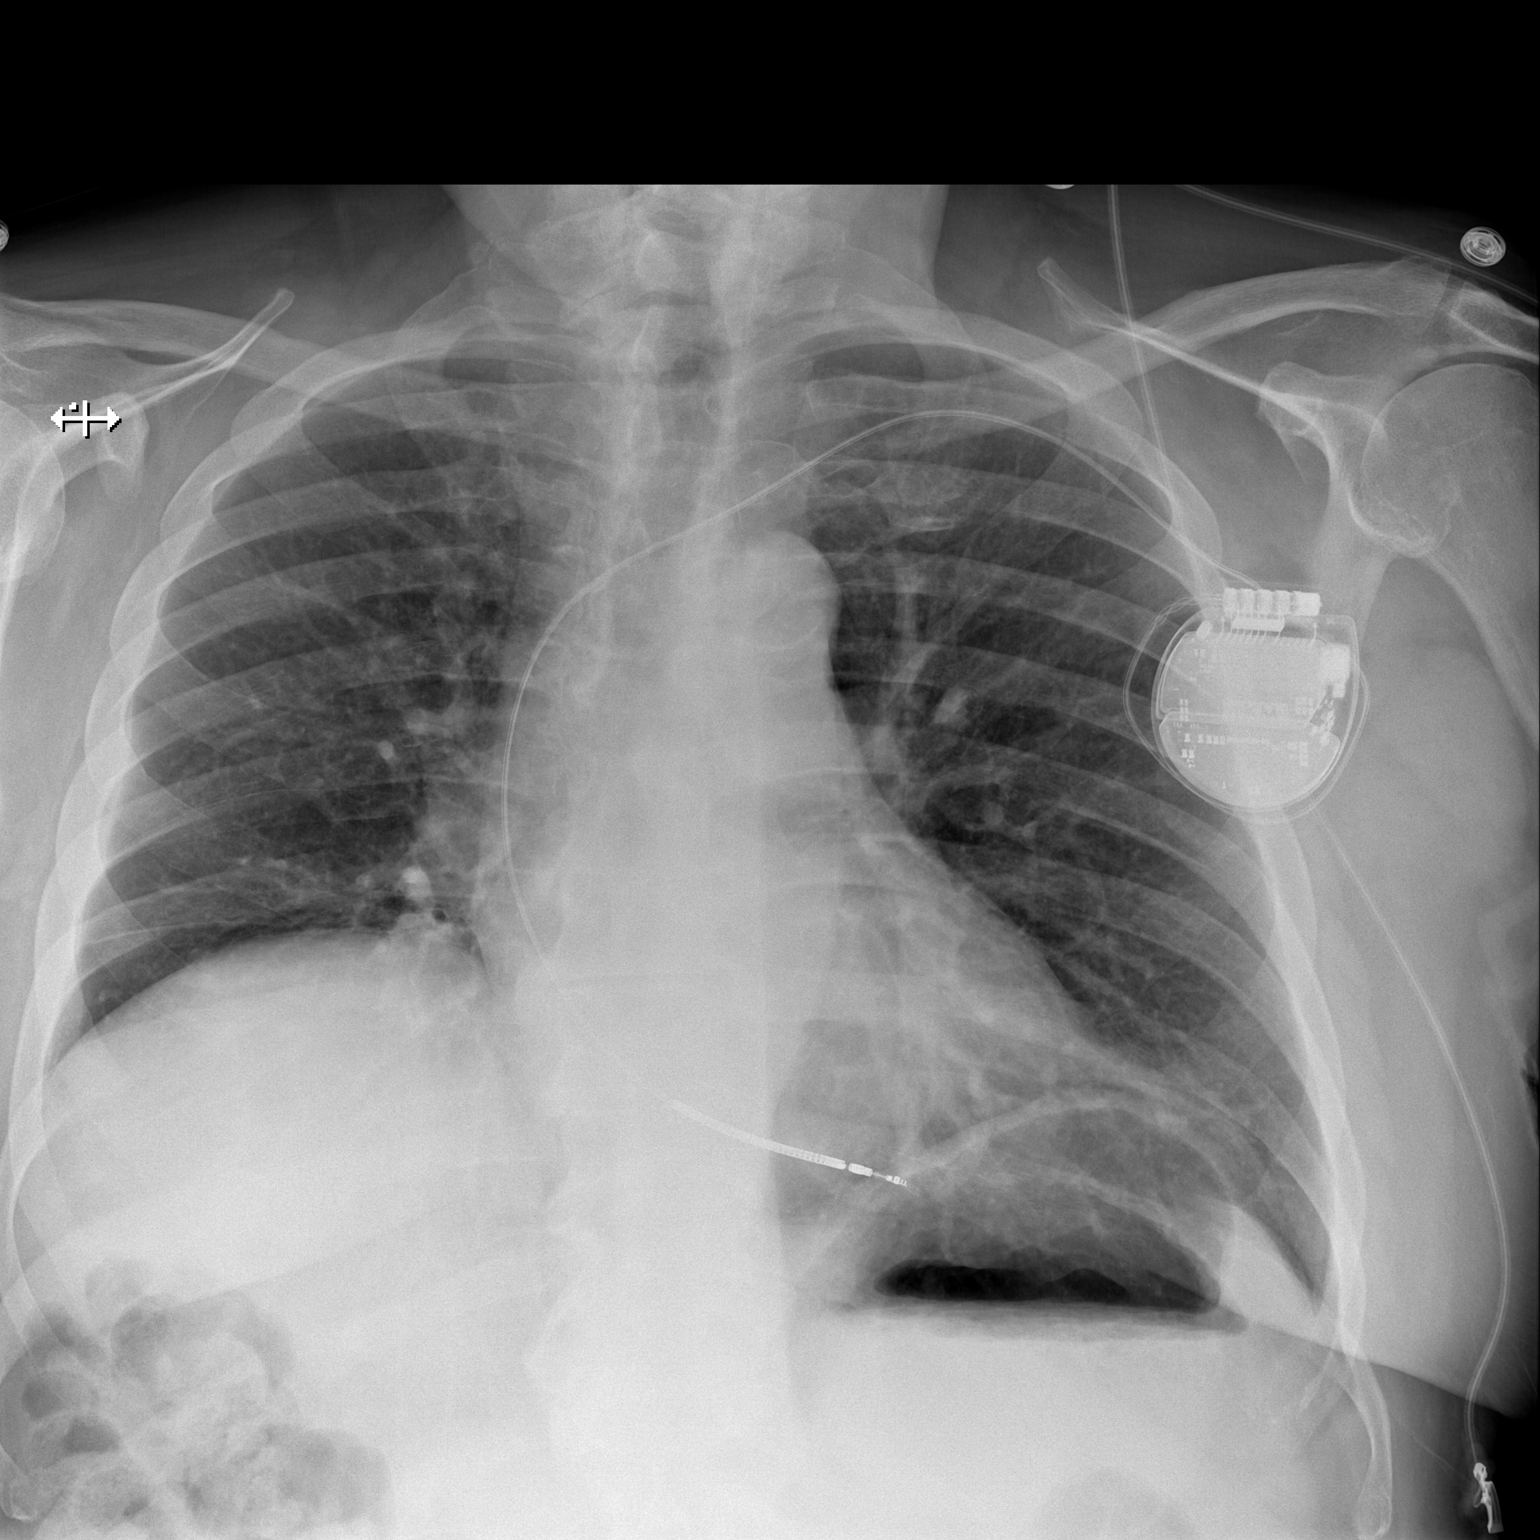

[w chest lat]
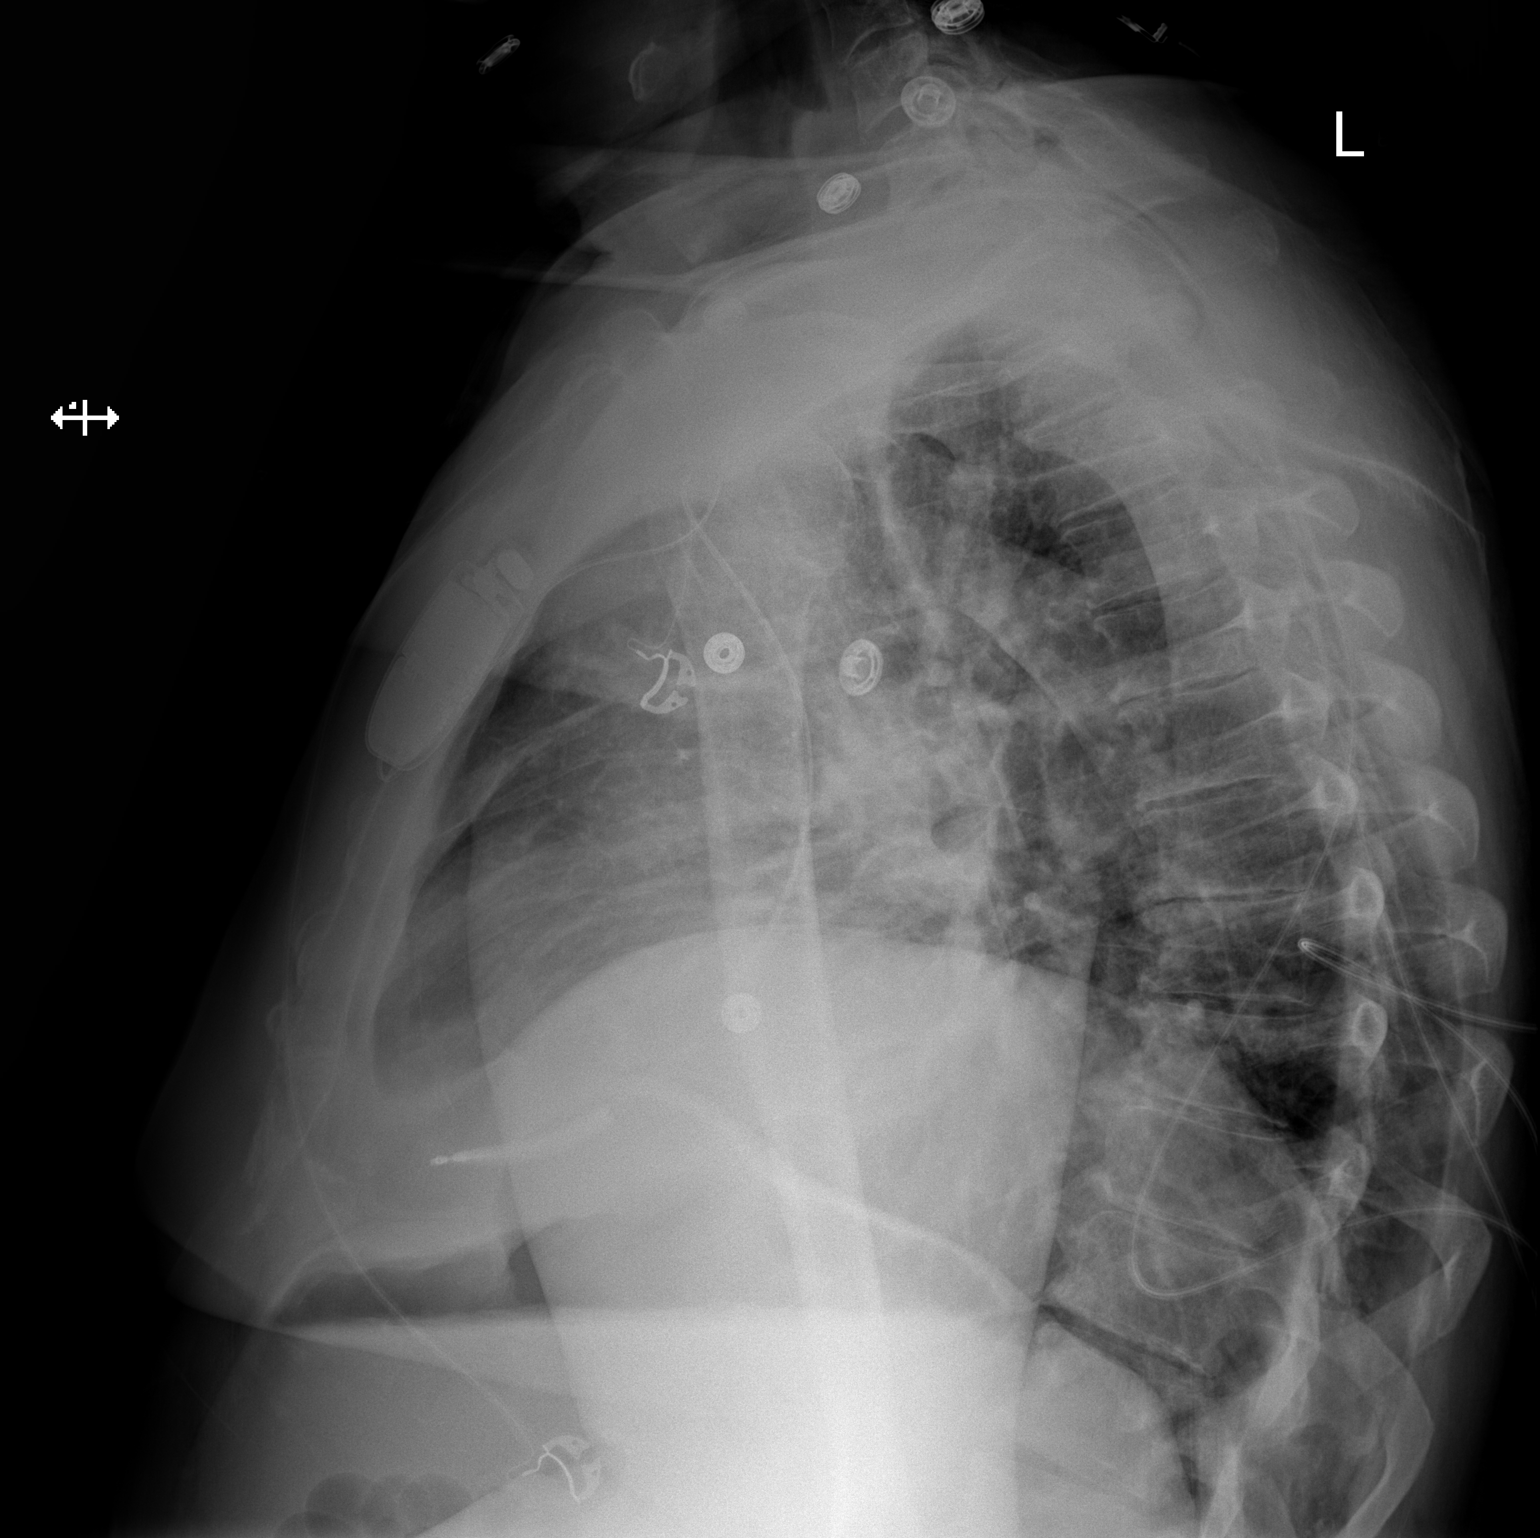

[2 of 2 positions shown; findings below may reference images not displayed]

FINDINGS: A single lead ventricular pacer is noted with appropriate lead
positioning. This represents a new finding when compared to the
prior study. There is no evidence of acute infiltrate, pleural
effusion or pneumothorax. Stable elevation of the right
hemidiaphragm is seen. The heart size and mediastinal contours are
within normal limits. There is mild calcification of the aortic
arch. The visualized skeletal structures are unremarkable.
IMPRESSION: Interval ventricular pacer placement since the prior study.

## 2020-03-20 SURGERY — ICD IMPLANT
Anesthesia: LOCAL

## 2020-03-20 MED ORDER — CHLORHEXIDINE GLUCONATE 4 % EX LIQD: 4.0000 "application " | Freq: Once | CUTANEOUS | Status: DC

## 2020-03-20 MED ORDER — LIDOCAINE HCL 1 % IJ SOLN
INTRAMUSCULAR | Status: AC
  Filled 2020-03-20: qty 20

## 2020-03-20 MED ORDER — FENTANYL CITRATE (PF) 100 MCG/2ML IJ SOLN
INTRAMUSCULAR | Status: AC
  Filled 2020-03-20: qty 2

## 2020-03-20 MED ORDER — SODIUM CHLORIDE 0.9 % IV SOLN
80.0000 mg | INTRAVENOUS | Status: AC
  Administered 2020-03-20: 80 mg
  Filled 2020-03-20: qty 2

## 2020-03-20 MED ORDER — ONDANSETRON HCL 4 MG/2ML IJ SOLN: 4.0000 mg | Freq: Four times a day (QID) | INTRAMUSCULAR | Status: DC | PRN

## 2020-03-20 MED ORDER — OXYCODONE-ACETAMINOPHEN 5-325 MG PO TABS: 1.0000 | ORAL_TABLET | Freq: Three times a day (TID) | ORAL | Status: DC | PRN

## 2020-03-20 MED ORDER — SODIUM CHLORIDE 0.9 % IV BOLUS
250.0000 mL | Freq: Once | INTRAVENOUS | Status: AC
  Administered 2020-03-20: 250 mL via INTRAVENOUS

## 2020-03-20 MED ORDER — SODIUM CHLORIDE 0.9 % IV SOLN
INTRAVENOUS | Status: AC
  Filled 2020-03-20: qty 2

## 2020-03-20 MED ORDER — HEPARIN (PORCINE) IN NACL 1000-0.9 UT/500ML-% IV SOLN
INTRAVENOUS | Status: DC | PRN
  Administered 2020-03-20: 500 mL

## 2020-03-20 MED ORDER — MIDAZOLAM HCL 5 MG/5ML IJ SOLN
INTRAMUSCULAR | Status: AC
  Filled 2020-03-20: qty 5

## 2020-03-20 MED ORDER — LIDOCAINE HCL (PF) 1 % IJ SOLN
INTRAMUSCULAR | Status: DC | PRN
  Administered 2020-03-20: 50 mL

## 2020-03-20 MED ORDER — CEFAZOLIN SODIUM-DEXTROSE 2-4 GM/100ML-% IV SOLN
INTRAVENOUS | Status: AC
  Filled 2020-03-20: qty 100

## 2020-03-20 MED ORDER — MIDAZOLAM HCL 5 MG/5ML IJ SOLN
INTRAMUSCULAR | Status: DC | PRN
  Administered 2020-03-20 (×5): 2 mg via INTRAVENOUS
  Administered 2020-03-20: 1 mg via INTRAVENOUS
  Administered 2020-03-20: 2 mg via INTRAVENOUS

## 2020-03-20 MED ORDER — CEFAZOLIN SODIUM-DEXTROSE 1-4 GM/50ML-% IV SOLN
1.0000 g | Freq: Once | INTRAVENOUS | Status: AC
  Administered 2020-03-20: 1 g via INTRAVENOUS
  Filled 2020-03-20: qty 50

## 2020-03-20 MED ORDER — SODIUM CHLORIDE 0.9 % IV SOLN: INTRAVENOUS | Status: DC

## 2020-03-20 MED ORDER — OXYCODONE-ACETAMINOPHEN 5-325 MG PO TABS: 1.0000 | ORAL_TABLET | Freq: Four times a day (QID) | ORAL | 0 refills | Status: AC | PRN

## 2020-03-20 MED ORDER — HEPARIN (PORCINE) IN NACL 1000-0.9 UT/500ML-% IV SOLN
INTRAVENOUS | Status: AC
  Filled 2020-03-20: qty 500

## 2020-03-20 MED ORDER — ACETAMINOPHEN 325 MG PO TABS
325.0000 mg | ORAL_TABLET | ORAL | Status: DC | PRN
  Administered 2020-03-20: 650 mg via ORAL
  Filled 2020-03-20 (×2): qty 2

## 2020-03-20 MED ORDER — FENTANYL CITRATE (PF) 100 MCG/2ML IJ SOLN
INTRAMUSCULAR | Status: DC | PRN
  Administered 2020-03-20 (×4): 25 ug via INTRAVENOUS

## 2020-03-20 MED ORDER — CEFAZOLIN SODIUM-DEXTROSE 2-4 GM/100ML-% IV SOLN
2.0000 g | INTRAVENOUS | Status: AC
  Administered 2020-03-20: 2 g via INTRAVENOUS

## 2020-03-20 MED ORDER — ACETAMINOPHEN 325 MG PO TABS
ORAL_TABLET | ORAL | Status: AC
  Filled 2020-03-20: qty 2

## 2020-03-20 SURGICAL SUPPLY — 6 items
CABLE SURGICAL S-101-97-12 (CABLE) ×2 IMPLANT
ICD GALLANT VR CDVRA500Q (ICD Generator) ×2 IMPLANT
LEAD DURATA 7122Q-65CM (Lead) ×2 IMPLANT
PAD PRO RADIOLUCENT 2001M-C (PAD) ×2 IMPLANT
SHEATH 7FR PRELUDE SNAP 13 (SHEATH) ×2 IMPLANT
TRAY PACEMAKER INSERTION (PACKS) ×2 IMPLANT

## 2020-03-20 NOTE — Progress Notes (Signed)
Was notified earlier the patient since post procedure with SBP 80's and asymptomatic Was eating talking, and feeling well.  Hs is s/p 250mg  IVB Patient continues to deny any symptoms other then chronic knee pain and site discomfort. He tells me he has been out of his pain meds for quite a while and asks for percocet , and denies tylenol. He took one of his usual AM pills today, thinks was his He denies any CP, SOB. His skin is warm, dry, sats 100% on RA Manual BP done x2 96/68 and 90/60  HR 70's  Monitor, no changes No pain meds  Sherryll Burger, PA-C

## 2020-03-20 NOTE — Discharge Instructions (Signed)
    Supplemental Discharge Instructions for  Pacemaker/Defibrillator Patients  Tomorrow, 03/21/2020, PLEASE SEND A REMOTE DEVICE TRANSMISSION   Activity No heavy lifting or vigorous activity with your left/right arm for 6 to 8 weeks.  Do not raise your left/right arm above your head for one week.  Gradually raise your affected arm as drawn below.             03/24/2020                   03/25/2020                03/26/2020              03/27/2020  __  NO DRIVING for 1 week   ; you may begin driving on  1/61/0960   .  WOUND CARE - Keep the wound area clean and dry.  Do not get this area wet for one week. No showers for one week; you may shower on 03/27/2020 . - Tomorrow, 03/21/2020, remove the arm sling - Tomorrow, 03/21/2020, remove the outer plastic bandage.  Underneath there are steri strips, DO NOT remove these - The tape/steri-strips on your wound will fall off; do not pull them off.  No bandage is needed on the site.  DO  NOT apply any creams, oils, or ointments to the wound area. - If you notice any drainage or discharge from the wound, any swelling or bruising at the site, or you develop a fever > 101? F after you are discharged home, call the office at once.  Special Instructions - You are still able to use cellular telephones; use the ear opposite the side where you have your pacemaker/defibrillator.  Avoid carrying your cellular phone near your device. - When traveling through airports, show security personnel your identification card to avoid being screened in the metal detectors.  Ask the security personnel to use the hand wand. - Avoid arc welding equipment, MRI testing (magnetic resonance imaging), TENS units (transcutaneous nerve stimulators).  Call the office for questions about other devices. - Avoid electrical appliances that are in poor condition or are not properly grounded. - Microwave ovens are safe to be near or to operate.  Additional information for defibrillator patients  should your device go off: - If your device goes off ONCE and you feel fine afterward, notify the device clinic nurses. - If your device goes off ONCE and you do not feel well afterward, call 911. - If your device goes off TWICE, call 911. - If your device goes off THREE times in one day, call 911.  DO NOT DRIVE YOURSELF OR A FAMILY MEMBER WITH A DEFIBRILLATOR TO THE HOSPITAL--CALL 911.

## 2020-03-20 NOTE — Progress Notes (Addendum)
Off floor for CXR Dr reviewed cxr and patient/ ready for discharge

## 2020-03-20 NOTE — Interval H&P Note (Signed)
History and Physical Interval Note:  03/20/2020 9:09 AM  Richard Graham  has presented today for surgery, with the diagnosis of cardiomyopathy.  The various methods of treatment have been discussed with the patient and family. After consideration of risks, benefits and other options for treatment, the patient has consented to  Procedure(s): ICD IMPLANT (N/A) as a surgical intervention.  The patient's history has been reviewed, patient examined, no change in status, stable for surgery.  I have reviewed the patient's chart and labs.  Questions were answered to the patient's satisfaction.     Richard Graham

## 2020-03-20 NOTE — Progress Notes (Signed)
Pt arrived after procedure with hypotension, fluids encouraged, pt is sleepy but no mentation problems, I paged Merita Norton PA/ EP to update. Current  bp 85/57 ///bp on arrival pre procedure was 107/71 Re paged, call returned, orders followed

## 2020-03-21 ENCOUNTER — Encounter (HOSPITAL_COMMUNITY): Payer: Self-pay | Admitting: Internal Medicine

## 2020-03-21 ENCOUNTER — Telehealth: Payer: Self-pay

## 2020-03-21 MED FILL — Lidocaine HCl Local Inj 1%: INTRAMUSCULAR | Qty: 60 | Status: AC

## 2020-03-21 NOTE — Telephone Encounter (Signed)
Spoke with the patient who called to confirm his dose of metoprolol. Advised patient that he should be taking Toprol XL (metoprolol succinate) 25 mg daily.  His Lopressor (metoprolol tartrate) was discontinued 06/21. Patient verbalized understanding.

## 2020-03-31 ENCOUNTER — Telehealth: Payer: Self-pay | Admitting: Internal Medicine

## 2020-03-31 NOTE — Telephone Encounter (Signed)
Patient calling back stating he is faxing over information for is disability. He would like a call back letting him know when it's been received.

## 2020-03-31 NOTE — Telephone Encounter (Signed)
New Message   Patient is calling and requesting that Dr. Ladona Ridgel nurse returns his call. He would not provide any information other than its about his health. Please call to discuss.

## 2020-03-31 NOTE — Telephone Encounter (Signed)
Pt is calling to see if Dr. 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    Pt has a lawyer that will start the disability process if Dr. Ladona Ridgel will provide documentation.  Advised to clarify what lawyer may need and will discuss with Dr. Ladona Ridgel tomorrow, Pt coming in for wound check.

## 2020-04-01 ENCOUNTER — Other Ambulatory Visit: Payer: Self-pay

## 2020-04-01 ENCOUNTER — Ambulatory Visit (INDEPENDENT_AMBULATORY_CARE_PROVIDER_SITE_OTHER): Payer: Commercial Managed Care - PPO | Admitting: Emergency Medicine

## 2020-04-01 DIAGNOSIS — I5022 Chronic systolic (congestive) heart failure: Secondary | ICD-10-CM

## 2020-04-01 DIAGNOSIS — I255 Ischemic cardiomyopathy: Secondary | ICD-10-CM

## 2020-04-01 DIAGNOSIS — Z95810 Presence of automatic (implantable) cardiac defibrillator: Secondary | ICD-10-CM

## 2020-04-01 NOTE — Telephone Encounter (Signed)
Initial paperwork required for lawyer related to disability completed by Dr. Ladona Ridgel and given to Pt.  Per Dr. Bernita Buffy disability paperwork should be completed by general cardiologist (Dr. Mayford Knife).  Advised Pt.  Also advised covering nurse of potential paperwork.

## 2020-04-04 ENCOUNTER — Ambulatory Visit (HOSPITAL_BASED_OUTPATIENT_CLINIC_OR_DEPARTMENT_OTHER): Payer: Commercial Managed Care - PPO

## 2020-04-09 ENCOUNTER — Ambulatory Visit (HOSPITAL_BASED_OUTPATIENT_CLINIC_OR_DEPARTMENT_OTHER): Payer: Commercial Managed Care - PPO | Admitting: Cardiology

## 2020-04-09 ENCOUNTER — Other Ambulatory Visit: Payer: Self-pay

## 2020-04-09 LAB — CUP PACEART INCLINIC DEVICE CHECK
Battery Remaining Longevity: 105 mo
Brady Statistic RV Percent Paced: 0 %
Date Time Interrogation Session: 20210817114500
HighPow Impedance: 65.25 Ohm
Implantable Lead Implant Date: 20210805
Implantable Lead Location: 753860
Implantable Pulse Generator Implant Date: 20210805
Lead Channel Impedance Value: 462.5 Ohm
Lead Channel Pacing Threshold Amplitude: 0.75 V
Lead Channel Pacing Threshold Pulse Width: 0.5 ms
Lead Channel Sensing Intrinsic Amplitude: 11.7 mV
Lead Channel Setting Pacing Amplitude: 3.5 V
Lead Channel Setting Pacing Pulse Width: 0.5 ms
Lead Channel Setting Sensing Sensitivity: 0.5 mV
Pulse Gen Serial Number: 111025604

## 2020-04-09 NOTE — Progress Notes (Signed)
Wound check appointment. Steri-strips removed. Wound without redness or edema. Incision edges approximated, wound well healed. Normal device function. Thresholds, sensing, and impedances consistent with implant measurements. Device programmed at 3.5V for extra safety margin until 3 month visit. Histogram distribution appropriate for patient and level of activity. No ventricular arrhythmias noted. Patient educated about wound care, arm mobility, lifting restrictions, shock plan. ROV with Dr Ladona Ridgel 06/24/20. Enrolled in remote monitoring and next remote 06/24/20.

## 2020-04-09 NOTE — H&P (Signed)
ICD Criteria  Current LVEF:22%. Within 12 months prior to implant: Yes   Heart failure history: Yes, Class III  Cardiomyopathy history: Yes, Ischemic Cardiomyopathy - Prior MI.  Atrial Fibrillation/Atrial Flutter: No.  Ventricular tachycardia history: No.  Cardiac arrest history: No.  History of syndromes with risk of sudden death: No.  Previous ICD: No.  Current ICD indication: Primary  PPM indication: No.  Class I or II Bradycardia indication present: No  Beta Blocker therapy for 3 or more months: Yes, prescribed.   Ace Inhibitor/ARB therapy for 3 or more months: Yes, prescribed.    I have seen Richard Graham is a 64 y.o. malepre-procedural and has been referred by Dr. Mayford Knife for consideration of ICD implant for primary prevention of sudden death.  The patient's chart has been reviewed and they meet criteria for ICD implant.  I have had a thorough discussion with the patient reviewing options.  The patient and their family (if available) have had opportunities to ask questions and have them answered. The patient and I have decided together through the [facility] Heart Care Share Decision Support Tool to proceed with ICD at this time.  Risks, benefits, alternatives to ICD implantation were discussed in detail with the patient today. The patient  understands that the risks include but are not limited to bleeding, infection, pneumothorax, perforation, tamponade, vascular damage, renal failure, MI, stroke, death, inappropriate shocks, and lead dislodgement and he wishes to proceed.

## 2020-04-24 ENCOUNTER — Telehealth: Payer: Self-pay | Admitting: *Deleted

## 2020-04-24 ENCOUNTER — Telehealth: Payer: Self-pay | Admitting: Cardiology

## 2020-04-24 DIAGNOSIS — I5022 Chronic systolic (congestive) heart failure: Secondary | ICD-10-CM

## 2020-04-24 DIAGNOSIS — R0683 Snoring: Secondary | ICD-10-CM

## 2020-04-24 DIAGNOSIS — I2510 Atherosclerotic heart disease of native coronary artery without angina pectoris: Secondary | ICD-10-CM

## 2020-04-24 DIAGNOSIS — I4891 Unspecified atrial fibrillation: Secondary | ICD-10-CM

## 2020-04-24 MED ORDER — FUROSEMIDE 40 MG PO TABS
40.0000 mg | ORAL_TABLET | Freq: Every day | ORAL | 3 refills | Status: DC
Start: 2020-04-24 — End: 2020-06-10

## 2020-04-24 NOTE — Telephone Encounter (Addendum)
Sleep lab [facility] called to say patient failed his home sleep study and needs a in lab study ordered. Order has been placed to sleep pool for an in lab.

## 2020-04-24 NOTE — Telephone Encounter (Signed)
Spoke with the patient who states that he is now going to have an in lab sleep study. He states that he has been having a lot of trouble breathing and wants to know if he can go ahead and get the CPAP. I advised him that he will need to have the study done first.  Patient also has concerns about returning to work.  Patient also reports that he has been having increased swelling in his ankles. He states that he has gained 5 lbs in the past week. He states that he is a little SOB. Denies any other symptoms. Continues to take lasix 20 mg daily and spironolactone 12.5 mg daily. Advised patient to elevate his feet and avoid salt.

## 2020-04-24 NOTE — Telephone Encounter (Signed)
Spoke with the patient and he will increase Lasix as advised by Dr. Mayford Knife. He will come in next Friday for lab work and call back if swelling does not improve.

## 2020-04-24 NOTE — Telephone Encounter (Signed)
Increase Lasix to 40mg  BID for 2 days and then 40mg  daily.  Check BMET in 1 week.  Call if weight and swelling do not decrease

## 2020-04-24 NOTE — Telephone Encounter (Signed)
Richard Graham is calling requesting to speak with [name]. He did not advise what it was in regards to beside being about his heart condition.

## 2020-04-25 ENCOUNTER — Encounter (HOSPITAL_COMMUNITY): Payer: Commercial Managed Care - PPO | Admitting: Cardiology

## 2020-04-25 NOTE — Addendum Note (Signed)
Addended by: Reesa Chew on: 04/25/2020 03:58 PM   Modules accepted: Orders

## 2020-04-26 NOTE — Progress Notes (Signed)
This encounter was created in error - please disregard.

## 2020-04-26 NOTE — Procedures (Signed)
Erroneous encounter

## 2020-05-02 ENCOUNTER — Other Ambulatory Visit: Payer: Self-pay

## 2020-05-02 ENCOUNTER — Other Ambulatory Visit: Payer: Commercial Managed Care - PPO | Admitting: *Deleted

## 2020-05-02 DIAGNOSIS — I5022 Chronic systolic (congestive) heart failure: Secondary | ICD-10-CM

## 2020-05-03 LAB — BASIC METABOLIC PANEL
BUN/Creatinine Ratio: 22 (ref 10–24)
BUN: 20 mg/dL (ref 8–27)
CO2: 27 mmol/L (ref 20–29)
Calcium: 10.1 mg/dL (ref 8.6–10.2)
Chloride: 95 mmol/L — ABNORMAL LOW (ref 96–106)
Creatinine, Ser: 0.93 mg/dL (ref 0.76–1.27)
GFR calc Af Amer: 101 mL/min/{1.73_m2} (ref >59)
GFR calc non Af Amer: 88 mL/min/{1.73_m2} (ref >59)
Glucose: 117 mg/dL — ABNORMAL HIGH (ref 65–99)
Potassium: 4.9 mmol/L (ref 3.5–5.2)
Sodium: 137 mmol/L (ref 134–144)

## 2020-05-06 NOTE — Addendum Note (Signed)
Addended by: Reesa Chew on: 05/06/2020 12:01 PM   Modules accepted: Orders

## 2020-05-07 ENCOUNTER — Telehealth: Payer: Self-pay | Admitting: *Deleted

## 2020-05-07 NOTE — Telephone Encounter (Signed)
PA submitted to [facility] via phone for sleep study. Clinicals faxed to [phone].

## 2020-05-12 ENCOUNTER — Telehealth: Payer: Self-pay | Admitting: *Deleted

## 2020-05-12 NOTE — Telephone Encounter (Signed)
Telephoned [facility] to check the status of sleep study PA request. Was informed it is still pending. Case # [id number]. Call reference: [id number].

## 2020-05-20 ENCOUNTER — Telehealth: Payer: Self-pay | Admitting: *Deleted

## 2020-05-20 NOTE — Telephone Encounter (Signed)
Patient has called several times wanting to know why he does not have a date for his in lab study after his Home Sleep test failed. The in lab test has been submitted but is still in pending status. Patient states he feels like he is dying, he can't breathe, he can't sleep, he has no energy and he ask if Dr Mayford Knife could just write him a Rx for a cpap. I explained the sleep study gives the settings for the cpap. I reached out to [place] at the sleep lab to see how we might be able to help this patient. Marita [place] suggested since the first home sleep study failed he will try a second home sleep study on 05/21/20 at 10 am. Pt is aware and agreeable to treatment.

## 2020-05-20 NOTE — Telephone Encounter (Signed)
Called [facility] to get updated status on patient's PA for in lab auth. Was told it is still in review. He doesn't see that anything else is needed. They have 14 bssiness days to complete it. States that it has only been 10 days. Call reference # [id number].

## 2020-05-21 ENCOUNTER — Ambulatory Visit (HOSPITAL_BASED_OUTPATIENT_CLINIC_OR_DEPARTMENT_OTHER): Payer: Commercial Managed Care - PPO | Attending: Cardiology | Admitting: Cardiology

## 2020-05-21 ENCOUNTER — Other Ambulatory Visit: Payer: Self-pay

## 2020-05-21 VITALS — Ht 74.0 in | Wt 242.0 lb

## 2020-05-21 DIAGNOSIS — G4733 Obstructive sleep apnea (adult) (pediatric): Secondary | ICD-10-CM

## 2020-05-21 DIAGNOSIS — I4891 Unspecified atrial fibrillation: Secondary | ICD-10-CM | POA: Diagnosis present

## 2020-05-28 ENCOUNTER — Telehealth: Payer: Self-pay | Admitting: Cardiology

## 2020-05-28 NOTE — Telephone Encounter (Signed)
Patient calling about sleep study results. He states he really needs his cpap machine because he has not been able to sleep in months.

## 2020-05-29 NOTE — Telephone Encounter (Signed)
I reached out to patient and informed him dr Mayford Knife has several sleep studies to read and once I get his result I will call him. Pt is agreeable to treatment.

## 2020-05-31 NOTE — Procedures (Signed)
   Patient Name: Richard Graham, Richard Graham Date: 05/21/2020 Gender: Male D.O.B: 1955-11-30 Age (years): 28 Referring Provider: Armanda Magic MD, ABSM Height (inches): 74 Interpreting Physician: Armanda Magic MD, ABSM Weight (lbs): 242 RPSGT: Kilmichael Sink BMI: 31 MRN: [id number] Neck Size: 17.00  CLINICAL INFORMATION Sleep Study Type: HST  Indication for sleep study: N/A  Epworth Sleepiness Score: 5  SLEEP STUDY TECHNIQUE A multi-channel overnight portable sleep study was performed. The channels recorded were: nasal airflow, thoracic respiratory movement, and oxygen saturation with a pulse oximetry. Snoring was also monitored.  MEDICATIONS Patient self administered medications include: N/A.  SLEEP ARCHITECTURE Patient was studied for 410 minutes. The sleep efficiency was 100.0 % and the patient was supine for 15.5%. The arousal index was 0.0 per hour.  RESPIRATORY PARAMETERS The overall AHI was 32.8 per hour, with a central apnea index of 0.0 per hour.  The oxygen nadir was 66% during sleep.  CARDIAC DATA Mean heart rate during sleep was 65.7 bpm.  IMPRESSIONS - Severe obstructive sleep apnea occurred during this study (AHI = 32.8/h). - No significant central sleep apnea occurred during this study (CAI = 0.0/h). - Severe oxygen desaturation was noted during this study (Min O2 = 66%). - Patient snored 22.6% during the sleep.  DIAGNOSIS - Obstructive Sleep Apnea (G47.33) - Nocturnal Hypoxemia (G47.36)  RECOMMENDATIONS - Recommend ResMed auto CPAP from 4-20cm H2O with heated humidity and mask of choice.  - Positional therapy avoiding supine position during sleep. - Avoid alcohol, sedatives and other CNS depressants that may worsen sleep apnea and disrupt normal sleep architecture. - Sleep hygiene should be reviewed to assess factors that may improve sleep quality. - Weight management and regular exercise should be initiated or continued. - Return to Sleep Center in 8  weeks.  [Electronically signed] 05/31/2020 08:01 PM  Armanda Magic MD, ABSM Diplomate, American Board of Sleep Medicine

## 2020-06-02 ENCOUNTER — Telehealth: Payer: Self-pay | Admitting: *Deleted

## 2020-06-02 ENCOUNTER — Telehealth: Payer: Self-pay | Admitting: Cardiology

## 2020-06-02 NOTE — Telephone Encounter (Signed)
-----   Message from Quintella Reichert, MD sent at 05/31/2020  8:05 PM EDT ----- Please let patient know that they have sleep apnea and recommend auto CPAP titration through DME  Orders have been placed in Epic. Please set 8 week OV with me.

## 2020-06-02 NOTE — Telephone Encounter (Signed)
Informed patient of sleep study results and patient understanding was verbalized. Patient understands his sleep study showed they have sleep apnea and recommend auto CPAP titration through DME. Orders have been placed in Epic. Please set 8 week OV with me.   Upon patient request DME selection is [facility]. Patient understands he will be contacted by [facility] to set up his cpap. Patient understands to call if [facility] does not contact him with new setup in a timely manner. Patient understands they will be called once confirmation has been received from [facility] that they have received their new machine to schedule 10 week follow up appointment.  [facility] notified of new cpap order  Please add to airview Patient was grateful for the call and thanked me. 

## 2020-06-02 NOTE — Telephone Encounter (Signed)
Advised patient he can go to the dentist 6 weeks after his implant date which was 03/20/2020. Verbalized understanding.

## 2020-06-02 NOTE — Telephone Encounter (Signed)
Left message for patient and advised him that [facility] would be in contact with him concerning his sleep study results as soon as Dr. Mayford Knife reads the study.

## 2020-06-02 NOTE — Telephone Encounter (Signed)
Pt called and he stated he is not sleeping and wants to know what is going on with his cpap.  He stated he is not sleeping and gaining weight.  He stated would like to know the results of the sleep Study and what the plan is?    He stated he also has a SJ ICD MERLIN-GT and was told he would have to hold off on getting his teeth cleaned.  He would like to know when he would be able to call the dentist to sch that?    Best number -[phone]

## 2020-06-02 NOTE — Telephone Encounter (Signed)
Called patients results, patient is aware and agreeable to his results.

## 2020-06-02 NOTE — Telephone Encounter (Signed)
Pt is calling with 2 different issues. One is for his CPP and the other is for his device. I will send this call to both Veda Canning, CMA for CPAP question as well as I will send to the device clinic in regards to question about his device and dental work.

## 2020-06-03 ENCOUNTER — Other Ambulatory Visit: Payer: Self-pay | Admitting: Cardiology

## 2020-06-03 NOTE — Telephone Encounter (Signed)
Eliquis 5mg  refill request received. Patient is 64 years old, weight-109.8kg, Crea-0.93 on 05/02/2020, Diagnosis-Afib, and last seen by Dr. 05/04/2020 on 02/28/2020. Dose is appropriate based on dosing criteria. Will send in refill to requested pharmacy.

## 2020-06-05 ENCOUNTER — Other Ambulatory Visit (HOSPITAL_COMMUNITY): Payer: Self-pay | Admitting: Interventional Radiology

## 2020-06-05 ENCOUNTER — Telehealth (HOSPITAL_COMMUNITY): Payer: Self-pay | Admitting: Radiology

## 2020-06-05 DIAGNOSIS — I671 Cerebral aneurysm, nonruptured: Secondary | ICD-10-CM

## 2020-06-05 NOTE — Telephone Encounter (Signed)
Called pt, left VM for pt to call back and schedule f/u cerebral angiogram with Deveshwar. JM

## 2020-06-10 ENCOUNTER — Ambulatory Visit (HOSPITAL_COMMUNITY)
Admission: RE | Admit: 2020-06-10 | Discharge: 2020-06-10 | Disposition: A | Discharge status: Home / Self Care | Payer: Commercial Managed Care - PPO | Source: Ambulatory Visit | Attending: Cardiology | Admitting: Cardiology

## 2020-06-10 ENCOUNTER — Telehealth (HOSPITAL_COMMUNITY): Payer: Self-pay

## 2020-06-10 ENCOUNTER — Other Ambulatory Visit: Payer: Self-pay

## 2020-06-10 VITALS — BP 117/83 | HR 96 | Wt 252.6 lb

## 2020-06-10 DIAGNOSIS — Z7901 Long term (current) use of anticoagulants: Secondary | ICD-10-CM | POA: Insufficient documentation

## 2020-06-10 DIAGNOSIS — Z87891 Personal history of nicotine dependence: Secondary | ICD-10-CM | POA: Insufficient documentation

## 2020-06-10 DIAGNOSIS — Z95810 Presence of automatic (implantable) cardiac defibrillator: Secondary | ICD-10-CM | POA: Diagnosis not present

## 2020-06-10 DIAGNOSIS — Z9989 Dependence on other enabling machines and devices: Secondary | ICD-10-CM | POA: Insufficient documentation

## 2020-06-10 DIAGNOSIS — Z7982 Long term (current) use of aspirin: Secondary | ICD-10-CM | POA: Diagnosis not present

## 2020-06-10 DIAGNOSIS — I671 Cerebral aneurysm, nonruptured: Secondary | ICD-10-CM | POA: Insufficient documentation

## 2020-06-10 DIAGNOSIS — Z79899 Other long term (current) drug therapy: Secondary | ICD-10-CM | POA: Insufficient documentation

## 2020-06-10 DIAGNOSIS — I2583 Coronary atherosclerosis due to lipid rich plaque: Secondary | ICD-10-CM

## 2020-06-10 DIAGNOSIS — I480 Paroxysmal atrial fibrillation: Secondary | ICD-10-CM | POA: Insufficient documentation

## 2020-06-10 DIAGNOSIS — I5022 Chronic systolic (congestive) heart failure: Secondary | ICD-10-CM

## 2020-06-10 DIAGNOSIS — I2510 Atherosclerotic heart disease of native coronary artery without angina pectoris: Secondary | ICD-10-CM

## 2020-06-10 DIAGNOSIS — Z9889 Other specified postprocedural states: Secondary | ICD-10-CM | POA: Diagnosis not present

## 2020-06-10 DIAGNOSIS — I255 Ischemic cardiomyopathy: Secondary | ICD-10-CM | POA: Diagnosis not present

## 2020-06-10 DIAGNOSIS — G4733 Obstructive sleep apnea (adult) (pediatric): Secondary | ICD-10-CM | POA: Diagnosis not present

## 2020-06-10 LAB — COMPREHENSIVE METABOLIC PANEL
ALT: 20 U/L (ref 0–44)
AST: 24 U/L (ref 15–41)
Albumin: 4.5 g/dL (ref 3.5–5.0)
Alkaline Phosphatase: 62 U/L (ref 38–126)
Anion gap: 8 (ref 5–15)
BUN: 13 mg/dL (ref 8–23)
CO2: 31 mmol/L (ref 22–32)
Calcium: 9.4 mg/dL (ref 8.9–10.3)
Chloride: 96 mmol/L — ABNORMAL LOW (ref 98–111)
Creatinine, Ser: 0.98 mg/dL (ref 0.61–1.24)
GFR, Estimated: >60 mL/min (ref >60)
Glucose, Bld: 117 mg/dL — ABNORMAL HIGH (ref 70–99)
Potassium: 5 mmol/L (ref 3.5–5.1)
Sodium: 135 mmol/L (ref 135–145)
Total Bilirubin: 0.9 mg/dL (ref 0.3–1.2)
Total Protein: 7.2 g/dL (ref 6.5–8.1)

## 2020-06-10 LAB — LIPID PANEL
Cholesterol: 137 mg/dL (ref 0–200)
HDL: 49 mg/dL (ref >40)
LDL Cholesterol: 68 mg/dL (ref 0–99)
Total CHOL/HDL Ratio: 2.8 RATIO
Triglycerides: 99 mg/dL (ref <150)
VLDL: 20 mg/dL (ref 0–40)

## 2020-06-10 LAB — TSH: TSH: 1.768 u[IU]/mL (ref 0.350–4.500)

## 2020-06-10 MED ORDER — AMIODARONE HCL 200 MG PO TABS
100.0000 mg | ORAL_TABLET | Freq: Every day | ORAL | 11 refills | Status: DC
Start: 2020-06-10 — End: 2021-06-08

## 2020-06-10 MED ORDER — ENTRESTO 49-51 MG PO TABS: 1.0000 | ORAL_TABLET | Freq: Two times a day (BID) | ORAL | 3 refills | Status: DC

## 2020-06-10 MED ORDER — FUROSEMIDE 40 MG PO TABS: ORAL_TABLET | ORAL | 3 refills | Status: DC

## 2020-06-10 NOTE — Telephone Encounter (Signed)
Samara Snide, RN  06/10/2020 4:43 PM EDT Back to Top    Patient advised and verbalized understanding. Med list updated

## 2020-06-10 NOTE — Telephone Encounter (Signed)
-----   Message from Laurey Morale, MD sent at 06/10/2020  4:08 PM EDT ----- Good lipids.  Stop KCl supplement.

## 2020-06-10 NOTE — Patient Instructions (Signed)
Decrease Furosemide to 40 mg (1 tab) in AM and 20 mg (1/2 tab) in PM  Decrease Amiodarone to 100 mg (1/2 tab) Daily  Increase Entresto to 49/51 mg Twice daily   Labs done today, we will call you for abnormal results  Your physician recommends that you return for lab work in: 10 days  You have been referred to Cardiac Rehab, they will call you to schedule this  Your physician has recommended that you have a cardiopulmonary stress test (CPX). CPX testing is a non-invasive measurement of heart and lung function. It replaces a traditional treadmill stress test. This type of test provides a tremendous amount of information that relates not only to your present condition but also for future outcomes. This test combines measurements of you ventilation, respiratory gas exchange in the lungs, electrocardiogram (EKG), blood pressure and physical response before, during, and following an exercise protocol.  Please follow up with our heart failure pharmacist in 3 weeks  Your physician recommends that you schedule a follow-up appointment in: 6 weeks  If you have any questions or concerns before your next appointment please send [place] a message through [place] or call our office at [phone].    TO LEAVE A MESSAGE FOR THE NURSE SELECT OPTION 2, PLEASE LEAVE A MESSAGE INCLUDING:  YOUR NAME  DATE OF BIRTH  CALL BACK NUMBER  REASON FOR CALL**this is important as we prioritize the call backs  YOU WILL RECEIVE A CALL BACK THE SAME DAY AS LONG AS YOU CALL BEFORE 4:00 PM  At the [facility], you and your health needs are our priority. As part of our continuing mission to provide you with exceptional heart care, we have created designated Provider Care Teams. These Care Teams include your primary Cardiologist (physician) and Advanced Practice Providers (APPs- Physician Assistants and Nurse Practitioners) who all work together to provide you with the care you need, when you need it.    You may see any of the following providers on your designated Care Team at your next follow up:  Dr Arvilla Meres  Dr Carron Curie, NP  Robbie Lis, [place]  Karle Plumber, PharmD   Please be sure to bring in all your medications bottles to every appointment.

## 2020-06-10 NOTE — Progress Notes (Signed)
PCP: [name], DO Cardiology: Dr. Mayford Knife HF Cardiology: Dr. Shirlee Latch  64 y.o. with history of CAD, PAF, and ischemic cardiomyopathy was referred by Dr. Mayford Knife for evaluation of CHF.  Patent was admitted to [facility] in 3/21 with CHF and atrial fibrillation/RVR. Echo showed EF 20-25%.  LHC was done, showing occluded mid LAD with collaterals.  Cardiac MRI showed EF 22%, apical aneurysm, no significant LAD territory viability; therefore, patient did not have CABG or PCI. Patient was additionally found to have right MCA aneurysm.  This was treated with embolization in 4/21.  In 5/21, he had DCCV back to NSR.  He has remained in NSR on amiodarone.  Repeat echo in 6/21 showed EF 20-25% with moderately decreased RV systolic function.  St Jude ICD was placed.  Patient has also been diagnosed with OSA and has started CPAP.   Weight trended up since 3/21 hospitalization. Patient had bilateral knee pain and also gout pain, he was not exercising much.  Recently, pain has been under better control and he has been more active.  Currently, no dyspnea walking on flat ground.  No chest pain.  No orthopnea/PND.  No lightheadedness.  No palpitations, he is in NSR today. Overall, feels ok.   ECG (personally reviewed): NSR, RBBB, LAFB, old ASMI.   Labs (9/21): K 4.9, creatinine 0.93  PMH: 1. Cerebral aneurysm: Right MCA aneurysm, embolized in 4/21 by IR.  2. Atrial fibrillation: Paroxysmal.  - DCCV 5/21 3. CAD: LHC (3/21) with totally occluded mid LAD with collaterals.  4. Chronic systolic CHF: Ischemic cardiomyopathy.   - Cardiac MRI (5/21): EF 22%, apical aneurysm, LAD territory scarring without evidence for significant viability. RV EF 31%.  - Echo (6/21): EF 20-25%, moderately decreased RV systolic function.  - St Jude ICD.  5. Gout 6. OSA: Using CPAP.   Social History   Socioeconomic History  . Marital status: Married    Spouse name: Not on file  . Number of children: Not on file  . Years of education:  Not on file  . Highest education level: Not on file  Occupational History  . Not on file  Tobacco Use  . Smoking status: Former Smoker    Packs/day: 0.25    Years: 3.00    Pack years: 0.75    Types: Cigarettes    Quit date: 08/17/2011    Years since quitting: 8.8  . Smokeless tobacco: Never Used  Vaping Use  . Vaping Use: Never used  Substance and Sexual Activity  . Alcohol use: Yes    Alcohol/week: 1.0 standard drink    Types: 1 Glasses of wine per week    Comment: occasional wine  . Drug use: No  . Sexual activity: Yes  Other Topics Concern  . Not on file  Social History Narrative  . Not on file   Social Determinants of Health   Financial Resource Strain:   . Difficulty of Paying Living Expenses: Not on file  Food Insecurity:   . Worried About Programme researcher, broadcasting/film/video in the Last Year: Not on file  . Ran Out of Food in the Last Year: Not on file  Transportation Needs:   . Lack of Transportation (Medical): Not on file  . Lack of Transportation (Non-Medical): Not on file  Physical Activity:   . Days of Exercise per Week: Not on file  . Minutes of Exercise per Session: Not on file  Stress:   . Feeling of Stress : Not on file  Social Connections:   .  Frequency of Communication with Friends and Family: Not on file  . Frequency of Social Gatherings with Friends and Family: Not on file  . Attends Religious Services: Not on file  . Active Member of Clubs or Organizations: Not on file  . Attends Banker Meetings: Not on file  . Marital Status: Not on file  Intimate Partner Violence:   . Fear of Current or Ex-Partner: Not on file  . Emotionally Abused: Not on file  . Physically Abused: Not on file  . Sexually Abused: Not on file   FH: No premature CAD or CHF.   ROS: All systems reviewed and negative except as per HPI.   Current Outpatient Medications  Medication Sig Dispense Refill  . amiodarone (PACERONE) 200 MG tablet Take 0.5 tablets (100 mg total) by  mouth daily. 15 tablet 11  . aspirin EC 81 MG tablet Take 81 mg by mouth daily.    . colchicine 0.6 MG tablet Take 0.6 mg by mouth 2 (two) times daily as needed (for gout flares).     . diclofenac sodium (VOLTAREN) 1 % GEL Apply 2 g topically 2 (two) times daily as needed (to affected sites- for arthritis pain).     Marland Kitchen ELIQUIS 5 MG TABS tablet TAKE 1 TABLET(5 MG) BY MOUTH TWICE DAILY 60 tablet 8  . furosemide (LASIX) 40 MG tablet Take 1 tablet (40 mg total) by mouth in the morning AND 0.5 tablets (20 mg total) every evening. 45 tablet 3  . metoprolol succinate (TOPROL XL) 25 MG 24 hr tablet Take 1 tablet (25 mg total) by mouth daily. 90 tablet 3  . rosuvastatin (CRESTOR) 5 MG tablet Take 1 tablet (5 mg total) by mouth daily. 90 tablet 3  . spironolactone (ALDACTONE) 25 MG tablet Take 0.5 tablets (12.5 mg total) by mouth daily. 45 tablet 3  . sacubitril-valsartan (ENTRESTO) 49-51 MG Take 1 tablet by mouth 2 (two) times daily. 60 tablet 3   No current facility-administered medications for this encounter.   BP 117/83   Pulse 96   Wt 114.6 kg (252 lb 9.6 oz)   SpO2 96%   BMI 32.43 kg/m  General: NAD Neck: Thick. No JVD, no thyromegaly or thyroid nodule.  Lungs: Clear to auscultation bilaterally with normal respiratory effort. CV: Nondisplaced PMI.  Heart regular S1/S2, no S3/S4, no murmur.  No peripheral edema.  No carotid bruit.  Normal pedal pulses.  Abdomen: Soft, nontender, no hepatosplenomegaly, no distention.  Skin: Intact without lesions or rashes.  Neurologic: Alert and oriented x 3.  Psych: Normal affect. Extremities: No clubbing or cyanosis.  HEENT: Normal.   Assessment/Plan: 1. CAD: LHC in 3/21 with occluded mid LAD with collaterals.  Cardiac MRI was not suggestive of LAD territory viability, there was an apical aneurysm but no thrombus.  Patient was managed medically. No chest pain.  - He is on Eliquis for atrial fibrillation and also ASA 81 for cerebral aneurysm embolization.   - Continue Crestor.  He is on a low dose, will check lipids today and increase Crestor to keep LDL < 50 ideally.  2. Atrial fibrillation: Paroxysmal, s/p DCCV to NSR in 5/21. He has been maintained in NSR on amiodarone.  - Continue amiodarone but will decrease to 100 mg daily.  Would consider atrial fibrillation ablation to allow him to avoid long-term amiodarone use.  Check TSH/LFTs today, will need regular eye exam.  - Continue Eliquis.  3. Chronic systolic CHF: Ischemic cardiomyopathy.  Echo in 6/21  with EF 20-25%, moderately decreased RV systolic function.  St Jude ICD, not CRT candidate.  He is not volume overloaded on exam.  NYHA class II symptoms.  - Increase Entresto to 49/51 bid.  BMET today and in 10 days.  - With increase in Entresto, decrease Lasix to 40 qam/20 qpm.  - Continue Toprol XL 25 mg daily.  - Continue spironolactone 12.5 mg daily.  - I will refer for cardiac rehab.  - I will arrange for CPX.  4. Right MCA aneurysm: S/p embolization.  - Following with IR, still on ASA 81 in addition to Eliquis.  Stop ASA when ok with neuro IR.  5. OSA: Using CPAP.   Followup in 3 wks with HF pharmacist for med titration.  See me in 6 wks.   Marca Ancona 06/11/2020

## 2020-06-17 ENCOUNTER — Encounter (HOSPITAL_COMMUNITY): Payer: Self-pay | Admitting: *Deleted

## 2020-06-17 NOTE — Progress Notes (Signed)
Received referral from Dr. Shirlee Latch for this pt to participate in Cardiac rehab with the diagnosis of Chronic Systolic Heart Failure.Clinical review of pt follow up appt on 10/26 with Dr. Shirlee Latch, overnight admission for ICD placement on 8/5; Dr. Ladona Ridgel consult note and Dr. Mayford Knife primary  - cardiologist office note. Pt is making the expected progress in recovery.  Pt ICD placed on 8/5 Pt appropriate for scheduling for on site cardiac rehab and/or enrollment in Virtual Cardiac Rehab.  Pt Covid Risk Score is 5.  Will forward to staff for follow up. Alanson Aly, BSN Cardiac and Emergency planning/management officer

## 2020-06-19 ENCOUNTER — Ambulatory Visit (INDEPENDENT_AMBULATORY_CARE_PROVIDER_SITE_OTHER): Payer: Commercial Managed Care - PPO

## 2020-06-19 ENCOUNTER — Telehealth (HOSPITAL_COMMUNITY): Payer: Self-pay

## 2020-06-19 DIAGNOSIS — I255 Ischemic cardiomyopathy: Secondary | ICD-10-CM

## 2020-06-19 LAB — CUP PACEART REMOTE DEVICE CHECK
Battery Remaining Longevity: 97 mo
Battery Remaining Percentage: 95 %
Battery Voltage: 3.05 V
Brady Statistic RV Percent Paced: 1 %
Date Time Interrogation Session: 20211104020654
HighPow Impedance: 70 Ohm
Implantable Lead Implant Date: 20210805
Implantable Lead Location: 753860
Implantable Pulse Generator Implant Date: 20210805
Lead Channel Impedance Value: 430 Ohm
Lead Channel Pacing Threshold Amplitude: 0.75 V
Lead Channel Pacing Threshold Pulse Width: 0.5 ms
Lead Channel Sensing Intrinsic Amplitude: 11.7 mV
Lead Channel Setting Pacing Amplitude: 3.5 V
Lead Channel Setting Pacing Pulse Width: 0.5 ms
Lead Channel Setting Sensing Sensitivity: 0.5 mV
Pulse Gen Serial Number: 111025604

## 2020-06-19 NOTE — Progress Notes (Signed)
Remote ICD transmission.   

## 2020-06-19 NOTE — Telephone Encounter (Signed)
Pt insurance is active and benefits verified through [place] 0, DED $3,000/$3,000 met, out of pocket $7,500/$7,500 met, co-insurance 20%. no pre-authorization required. Passport, [facility] 06/19/2020'@10' :08am, REF# [id number]  Will contact patient to see if he is interested in the Cardiac Rehab Program. If interested, patient will need to complete follow up appt. Once completed, patient will be contacted for scheduling upon review by the RN Navigator.

## 2020-06-20 ENCOUNTER — Other Ambulatory Visit: Payer: Self-pay

## 2020-06-20 ENCOUNTER — Ambulatory Visit (HOSPITAL_COMMUNITY)
Admission: RE | Admit: 2020-06-20 | Discharge: 2020-06-20 | Disposition: A | Discharge status: Home / Self Care | Payer: Commercial Managed Care - PPO | Source: Ambulatory Visit | Attending: Cardiology | Admitting: Cardiology

## 2020-06-20 DIAGNOSIS — I5022 Chronic systolic (congestive) heart failure: Secondary | ICD-10-CM | POA: Diagnosis not present

## 2020-06-20 LAB — BASIC METABOLIC PANEL
Anion gap: 8 (ref 5–15)
BUN: 17 mg/dL (ref 8–23)
CO2: 28 mmol/L (ref 22–32)
Calcium: 9.3 mg/dL (ref 8.9–10.3)
Chloride: 103 mmol/L (ref 98–111)
Creatinine, Ser: 0.95 mg/dL (ref 0.61–1.24)
GFR, Estimated: >60 mL/min (ref >60)
Glucose, Bld: 103 mg/dL — ABNORMAL HIGH (ref 70–99)
Potassium: 4.2 mmol/L (ref 3.5–5.1)
Sodium: 139 mmol/L (ref 135–145)

## 2020-06-24 ENCOUNTER — Telehealth (HOSPITAL_COMMUNITY): Payer: Self-pay | Admitting: Pharmacy Technician

## 2020-06-24 ENCOUNTER — Encounter: Payer: Self-pay | Admitting: Internal Medicine

## 2020-06-24 ENCOUNTER — Ambulatory Visit (INDEPENDENT_AMBULATORY_CARE_PROVIDER_SITE_OTHER): Payer: Commercial Managed Care - PPO | Admitting: Internal Medicine

## 2020-06-24 ENCOUNTER — Other Ambulatory Visit: Payer: Self-pay

## 2020-06-24 VITALS — BP 112/70 | HR 84 | Ht 74.0 in | Wt 260.2 lb

## 2020-06-24 DIAGNOSIS — Z95810 Presence of automatic (implantable) cardiac defibrillator: Secondary | ICD-10-CM

## 2020-06-24 DIAGNOSIS — I5022 Chronic systolic (congestive) heart failure: Secondary | ICD-10-CM | POA: Diagnosis not present

## 2020-06-24 DIAGNOSIS — I10 Essential (primary) hypertension: Secondary | ICD-10-CM

## 2020-06-24 DIAGNOSIS — I480 Paroxysmal atrial fibrillation: Secondary | ICD-10-CM

## 2020-06-24 NOTE — Telephone Encounter (Signed)
Cardiac Rehab Medication Review by a Pharmacist  Does the patient feel that his/her medications are working for him/her?  yes  Has the patient been experiencing any side effects to the medications prescribed?  no  Does the patient measure his/her own blood pressure or blood glucose at home?  yes - BP averages 110's/70's   Does the patient have any problems obtaining medications due to transportation or finances?   no  Understanding of regimen: good Understanding of indications: good Potential of compliance: good   Pharmacist Intervention: N/A   Richard Graham, PharmD PGY1 Acute Care Pharmacy Resident Phone: [phone] 06/24/2020 4:18 PM  Please check [email or web address] for unit specific pharmacy phone numbers.

## 2020-06-24 NOTE — Progress Notes (Signed)
HPI [name] returns today for followup. He is a pleasant 64 yo man with PAF on amiodarone, an ICM, s/p MI, RBBB, chronic systolic heart failure, s/p ICD insertion. He has had his dose of amiodarone reduced to 100 mg daily. His BMI is increased. He denies angina and he has not had any ICD therapies. No chest pain.  No Known Allergies   Current Outpatient Medications  Medication Sig Dispense Refill   amiodarone (PACERONE) 200 MG tablet Take 0.5 tablets (100 mg total) by mouth daily. 15 tablet 11   aspirin EC 81 MG tablet Take 81 mg by mouth daily.     colchicine 0.6 MG tablet Take 0.6 mg by mouth 2 (two) times daily as needed (for gout flares).      diclofenac sodium (VOLTAREN) 1 % GEL Apply 2 g topically 2 (two) times daily as needed (to affected sites- for arthritis pain).      ELIQUIS 5 MG TABS tablet TAKE 1 TABLET(5 MG) BY MOUTH TWICE DAILY 60 tablet 8   furosemide (LASIX) 40 MG tablet Take 1 tablet (40 mg total) by mouth in the morning AND 0.5 tablets (20 mg total) every evening. 45 tablet 3   metoprolol succinate (TOPROL XL) 25 MG 24 hr tablet Take 1 tablet (25 mg total) by mouth daily. 90 tablet 3   oxyCODONE-acetaminophen (PERCOCET/ROXICET) 5-325 MG tablet Take 1 tablet by mouth in the morning, at noon, and at bedtime.     rosuvastatin (CRESTOR) 5 MG tablet Take 1 tablet (5 mg total) by mouth daily. 90 tablet 3   sacubitril-valsartan (ENTRESTO) 49-51 MG Take 1 tablet by mouth 2 (two) times daily. 60 tablet 3   spironolactone (ALDACTONE) 25 MG tablet Take 0.5 tablets (12.5 mg total) by mouth daily. 45 tablet 3   No current facility-administered medications for this visit.     Past Medical History:  Diagnosis Date   CAD (coronary artery disease), native coronary artery    chronically occluded mid LAD with left to left collaterals.    Cerebral aneurysm    s/p embolization   Chronic systolic (congestive) heart failure (HCC)    biventricular HF with  moderate RV dysfunction and severe LV dysfunction by Cardiac MRI with EF 22% no viability in the apical septal/apical/inferior,mid anterior and true apex.     Gout    HLD (hyperlipidemia)    PAF (paroxysmal atrial fibrillation) (HCC)    s/p DCCV 12/2019   Pre-diabetes    diet controlled, no meds    ROS:   All systems reviewed and negative except as noted in the HPI.   Past Surgical History:  Procedure Laterality Date   CARDIOVERSION N/A 12/28/2019   Procedure: CARDIOVERSION;  Surgeon: Chilton Si, MD;  Location: [facility] ENDOSCOPY;  Service: Cardiovascular;  Laterality: N/A;   COLONOSCOPY     ICD IMPLANT N/A 03/20/2020   Procedure: ICD IMPLANT;  Surgeon: Marinus Maw, MD;  Location: [facility] INVASIVE CV LAB;  Service: Cardiovascular;  Laterality: N/A;   INSERTION OF MESH N/A 06/27/2015   Procedure: INSERTION OF MESH;  Surgeon: Axel Filler, MD;  Location: [facility] ORS;  Service: General;  Laterality: N/A;   IR 3D INDEPENDENT WKST  11/14/2019   IR 3D INDEPENDENT WKST  11/22/2019   IR ANGIO INTRA EXTRACRAN SEL COM CAROTID INNOMINATE BILAT MOD SED  11/14/2019   IR ANGIO INTRA EXTRACRAN SEL INTERNAL CAROTID UNI R MOD SED  11/22/2019   IR ANGIO VERTEBRAL SEL VERTEBRAL BILAT MOD SED  11/14/2019   IR ANGIOGRAM FOLLOW UP STUDY  11/22/2019   IR CT HEAD LTD  11/22/2019   IR NEURO EACH ADD'L AFTER BASIC UNI RIGHT (MS)  11/22/2019   IR TRANSCATH/EMBOLIZ  11/22/2019   laceration to fingers     left hand   MOUTH SURGERY     secondary to abscess    RADIOLOGY WITH ANESTHESIA N/A 11/22/2019   Procedure: EMBOLIZATION;  Surgeon: Julieanne Cotton, MD;  Location: [facility] OR;  Service: Radiology;  Laterality: N/A;   RIGHT/LEFT HEART CATH AND CORONARY ANGIOGRAPHY N/A 11/12/2019   Procedure: RIGHT/LEFT HEART CATH AND CORONARY ANGIOGRAPHY;  Surgeon: Corky Crafts, MD;  Location: [facility] INVASIVE CV LAB;  Service: Cardiovascular;  Laterality: N/A;   trauma to lip      sutured   VENTRAL HERNIA REPAIR N/A  06/27/2015   Procedure: LAPAROSCOPIC VENTRAL HERNIA;  Surgeon: Axel Filler, MD;  Location: [facility] ORS;  Service: General;  Laterality: N/A;     History reviewed. No pertinent family history.   Social History   Socioeconomic History   Marital status: Married    Spouse name: Not on file   Number of children: Not on file   Years of education: Not on file   Highest education level: Not on file  Occupational History   Not on file  Tobacco Use   Smoking status: Former Smoker    Packs/day: 0.25    Years: 3.00    Pack years: 0.75    Types: Cigarettes    Quit date: 08/17/2011    Years since quitting: 8.8   Smokeless tobacco: Never Used  Vaping Use   Vaping Use: Never used  Substance and Sexual Activity   Alcohol use: Yes    Alcohol/week: 1.0 standard drink    Types: 1 Glasses of wine per week    Comment: occasional wine   Drug use: No   Sexual activity: Yes  Other Topics Concern   Not on file  Social History Narrative   Not on file   Social Determinants of Health   Financial Resource Strain:    Difficulty of Paying Living Expenses: Not on file  Food Insecurity:    Worried About Running Out of Food in the Last Year: Not on file   Ran Out of Food in the Last Year: Not on file  Transportation Needs:    Lack of Transportation (Medical): Not on file   Lack of Transportation (Non-Medical): Not on file  Physical Activity:    Days of Exercise per Week: Not on file   Minutes of Exercise per Session: Not on file  Stress:    Feeling of Stress : Not on file  Social Connections:    Frequency of Communication with Friends and Family: Not on file   Frequency of Social Gatherings with Friends and Family: Not on file   Attends Religious Services: Not on file   Active Member of Clubs or Organizations: Not on file   Attends Banker Meetings: Not on file   Marital Status: Not on file  Intimate Partner Violence:    Fear of Current or  Ex-Partner: Not on file   Emotionally Abused: Not on file   Physically Abused: Not on file   Sexually Abused: Not on file     BP 112/70    Pulse 84    Ht 6\' 2"  (1.88 m)    Wt 260 lb 3.2 oz (118 kg)    SpO2 90%    BMI 33.41 kg/m  Physical Exam:  Well appearing NAD HEENT: Unremarkable Neck:  No JVD, no thyromegally Lymphatics:  No adenopathy Back:  No CVA tenderness Lungs:  Clear with no wheezes HEART:  Regular rate rhythm, no murmurs, no rubs, no clicks Abd:  soft, positive bowel sounds, no organomegally, no rebound, no guarding Ext:  2 plus pulses, no edema, no cyanosis, no clubbing Skin:  No rashes no nodules Neuro:  CN II through XII intact, motor grossly intact  EKG - NSR with RBBB  DEVICE  Normal device function.  See PaceArt for details.   Assess/Plan: 1. Chronic systolic heart failure - his symptoms are class 2. He will continue his current meds. 2. PAF - he is maintaining NSR. He will continue low dose amiodarone. I discussed atrial fib ablation. His size makes him not a great candidate for ablation. He needs to lose weight. 3. ICD - his St. Jude ICD is working normaly. 4. Dyslipidemia - he will continue his current meds.  Dorathy Daft.

## 2020-06-24 NOTE — Patient Instructions (Signed)
Medication Instructions:  Your physician recommends that you continue on your current medications as directed. Please refer to the Current Medication list given to you today.  Labwork: None ordered.  Testing/Procedures: None ordered.  Follow-Up: Your physician wants you to follow-up in: one year with Dr. Ladona Ridgel.   You will receive a reminder letter in the mail two months in advance. If you don't receive a letter, please call our office to schedule the follow-up appointment.  Remote monitoring is used to monitor your ICD from home. This monitoring reduces the number of office visits required to check your device to one time per year. It allows [place] to keep an eye on the functioning of your device to ensure it is working properly. You are scheduled for a device check from home on 09/18/2020. You may send your transmission at any time that day. If you have a wireless device, the transmission will be sent automatically. After your physician reviews your transmission, you will receive a postcard with your next transmission date.  Any Other Special Instructions Will Be Listed Below (If Applicable).  If you need a refill on your cardiac medications before your next appointment, please call your pharmacy.

## 2020-06-24 NOTE — Progress Notes (Signed)
PCP: [name], DO Cardiology: Dr. Mayford Knife HF Cardiology: Dr. Shirlee Latch  HPI:  64 y.o. with history of CAD, PAF, and ischemic cardiomyopathy was referred by Dr. Mayford Knife for evaluation of CHF.  Patient was admitted to [facility] in 3/21 with CHF and atrial fibrillation/RVR. Echo showed EF 20-25%.  LHC was done, showing occluded mid LAD with collaterals.  Cardiac MRI showed EF 22%, apical aneurysm, no significant LAD territory viability; therefore, patient did not have CABG or PCI. Patient was additionally found to have right MCA aneurysm.  This was treated with embolization in 4/21.  In 5/21, he had DCCV back to NSR.  He has remained in NSR on amiodarone.  Repeat echo in 6/21 showed EF 20-25% with moderately decreased RV systolic function.  St Jude ICD was placed.  Patient has also been diagnosed with OSA and has started CPAP.   Recently presented to HF Clinic with Dr. Shirlee Latch on 06/10/20. Weight had trended up since 3/21 hospitalization. Patient had bilateral knee pain and also gout pain, he was not exercising much.  Recently, pain had been under better control and he had been more active.  Currently, no dyspnea walking on flat ground.  No chest pain.  No orthopnea/PND.  No lightheadedness.  No palpitations, he was in NSR in clinic. Overall, felt ok.   Today he returns to HF clinic for pharmacist medication titration. At last visit with MD, Sherryll Burger was increased to 49/51 mg BID and furosemide was decreased to 40 mg QAM/20 mg QPM. Additionally, amiodarone was decreased to 100 mg daily and potassium supplementation was discontinued.  Overall he is feeling well today. However, he is concerned about medication affordability because he just lost his job. He will have interim insurance coverage through 08/15/20, but will not have insurance after 08/16/20. No dizziness, lightheadedness, chest pain or palpitations. Notes he has been very sedentary lately, which has led to some weight gain. He will start cardiac rehab this  Wednesday. Now, can walk 50 yards before becoming SOB. Weight has been stable at home. He takes furosemide 40 mg every morning and 20 mg every evening and has not needed any extra. No LEE, PND or orthopnea. Tries to follow a low salt diet.    HF Medications: Metoprolol succinate 25 mg daily Entresto 49/51 mg BID Spironolactone 12.5 mg daily Furosemide 40 mg QAM/20 mg QPM.   Has the patient been experiencing any side effects to the medications prescribed?  no  Does the patient have any problems obtaining medications due to transportation or finances?   Yes - just lost his job (company is closing) so he will no longer have insurance. Has interim coverage through 08/15/20. I had him sign [employer] and [employer] patient assistance applications so we would be prepared to submit for PAP after 08/16/20.   Understanding of regimen: good Understanding of indications: good Potential of compliance: good Patient understands to avoid NSAIDs. Patient understands to avoid decongestants.    Pertinent Lab Values (06/20/20): Marland Kitchen Serum creatinine 0.95, BUN 17, Potassium 4.2, Sodium 139  Vital Signs: . Weight: 257.8 lbs (last clinic weight: 258.4 lbs) . Blood pressure: 110/62  . Heart rate: 87   Assessment: 1. CAD: LHC in 3/21 with occluded mid LAD with collaterals.  Cardiac MRI was not suggestive of LAD territory viability, there was an apical aneurysm but no thrombus.  Patient was managed medically. No chest pain.  - He is on Eliquis for atrial fibrillation and also ASA 81 for cerebral aneurysm embolization.  - Continue Crestor.  Keep LDL < 50 ideally.  2. Atrial fibrillation: Paroxysmal, s/p DCCV to NSR in 5/21. He has been maintained in NSR on amiodarone.  - Continue amiodarone 100 mg daily.  Would consider atrial fibrillation ablation to allow him to avoid long-term amiodarone use.   - Continue Eliquis.  3. Chronic systolic CHF: Ischemic cardiomyopathy.  Echo in 6/21 with EF 20-25%, moderately decreased RV  systolic function.  St Jude ICD, not CRT candidate. -He is not volume overloaded on exam. -NYHA class II symptoms.  - Continue furosemide 40 mg QPM/20 mg QPM - Continue metoprolol XL 25 mg daily. - Continue Entresto 49/51 mg BID.   - Increase spironolactone to 25 mg daily. Repeat BMET in 10-14 days. - Would be good candidate for SGLT2i in the future. Will wait until he has a better understanding of his interim prescription insurance coverage before initiating.  4. Right MCA aneurysm: S/p embolization.  - Following with IR, still on ASA 81 in addition to Eliquis.  Stop ASA when ok with neuro IR.  5. OSA: Using CPAP.    Plan: 1) Medication changes: Based on clinical presentation, vital signs and recent labs will increase spironolactone to 25 mg daily. 2) Follow-up: 2 weeks with Dr. Jonn Shingles, PharmD, BCPS, [facility], CPP Heart Failure Clinic Pharmacist [phone]

## 2020-07-01 ENCOUNTER — Other Ambulatory Visit: Payer: Self-pay

## 2020-07-01 ENCOUNTER — Ambulatory Visit (HOSPITAL_COMMUNITY): Payer: Commercial Managed Care - PPO | Attending: Cardiology

## 2020-07-01 DIAGNOSIS — I5022 Chronic systolic (congestive) heart failure: Secondary | ICD-10-CM

## 2020-07-02 ENCOUNTER — Telehealth (HOSPITAL_COMMUNITY): Payer: Self-pay | Admitting: *Deleted

## 2020-07-03 ENCOUNTER — Telehealth (HOSPITAL_COMMUNITY): Payer: Self-pay | Admitting: *Deleted

## 2020-07-03 ENCOUNTER — Other Ambulatory Visit: Payer: Self-pay

## 2020-07-03 ENCOUNTER — Telehealth (HOSPITAL_COMMUNITY): Payer: Self-pay

## 2020-07-03 ENCOUNTER — Encounter (HOSPITAL_COMMUNITY)
Admission: RE | Admit: 2020-07-03 | Discharge: 2020-07-03 | Disposition: A | Discharge status: Home / Self Care | Payer: Commercial Managed Care - PPO | Source: Ambulatory Visit | Attending: Cardiology | Admitting: Cardiology

## 2020-07-03 VITALS — BP 99/59 | Ht 70.75 in | Wt 258.4 lb

## 2020-07-03 DIAGNOSIS — I5022 Chronic systolic (congestive) heart failure: Secondary | ICD-10-CM | POA: Insufficient documentation

## 2020-07-03 NOTE — Telephone Encounter (Signed)
Called pt insurance [employer] to see if pt was still active. Per Bennie Hind from [facility] pt no longer has active coverage and was terminated on 06/15/2020. They were unable to provide any information as to why pt was terminated. Advised pt cardiac rehab nurse Byrd Hesselbach RN of this information.

## 2020-07-03 NOTE — Telephone Encounter (Addendum)
Spoke with patient. Confirmed orientation appointment with the patient he is coming later as he missed his earlier appointment. Patient reported that he went to get his eliquis filled and was told he had a $600.00 cop ay. The patient said that he has a week worth left of the medication and plans to stop taking it.Richard Graham says that he has enough medication to last until Sunday. I advised the patient not to stop his medicaitons."If I have to choose between taking my medication and eating I am going to eat." The heart failure clinic was called and notified. Richard Graham has an appointment to see the pharmacist on Monday.[name], RN,BSN 07/03/2020 11:41 AM

## 2020-07-04 ENCOUNTER — Encounter (HOSPITAL_COMMUNITY): Payer: Self-pay

## 2020-07-04 ENCOUNTER — Telehealth (HOSPITAL_COMMUNITY): Payer: Self-pay

## 2020-07-04 NOTE — Progress Notes (Signed)
Cardiac Individual Treatment Plan  Patient Details  Name: Richard Graham MRN: [id number] Date of Birth: 03-25-1956 Referring Provider:     CARDIAC REHAB PHASE II ORIENTATION from 07/03/2020 in [facility] CARDIAC REHAB  Referring Provider Laurey Morale, MD      Initial Encounter Date:    CARDIAC REHAB PHASE II ORIENTATION from 07/03/2020 in [facility] CARDIAC REHAB  Date 07/03/20      Visit Diagnosis: Heart failure, chronic systolic (HCC)  Patient's Home Medications on Admission:  Current Outpatient Medications:  .  amiodarone (PACERONE) 200 MG tablet, Take 0.5 tablets (100 mg total) by mouth daily., Disp: 15 tablet, Rfl: 11 .  aspirin EC 81 MG tablet, Take 81 mg by mouth daily., Disp: , Rfl:  .  colchicine 0.6 MG tablet, Take 0.6 mg by mouth 2 (two) times daily as needed (for gout flares). , Disp: , Rfl:  .  diclofenac sodium (VOLTAREN) 1 % GEL, Apply 2 g topically 2 (two) times daily as needed (to affected sites- for arthritis pain). , Disp: , Rfl:  .  ELIQUIS 5 MG TABS tablet, TAKE 1 TABLET(5 MG) BY MOUTH TWICE DAILY (Patient taking differently: Take 5 mg by mouth 2 (two) times daily. ), Disp: 60 tablet, Rfl: 8 .  furosemide (LASIX) 40 MG tablet, Take 1 tablet (40 mg total) by mouth in the morning AND 0.5 tablets (20 mg total) every evening., Disp: 45 tablet, Rfl: 3 .  metoprolol succinate (TOPROL XL) 25 MG 24 hr tablet, Take 1 tablet (25 mg total) by mouth daily., Disp: 90 tablet, Rfl: 3 .  oxyCODONE-acetaminophen (PERCOCET/ROXICET) 5-325 MG tablet, Take 1 tablet by mouth in the morning, at noon, and at bedtime., Disp: , Rfl:  .  rosuvastatin (CRESTOR) 5 MG tablet, Take 1 tablet (5 mg total) by mouth daily., Disp: 90 tablet, Rfl: 3 .  sacubitril-valsartan (ENTRESTO) 49-51 MG, Take 1 tablet by mouth 2 (two) times daily., Disp: 60 tablet, Rfl: 3 .  spironolactone (ALDACTONE) 25 MG tablet, Take 0.5 tablets (12.5 mg total) by mouth daily., Disp:  45 tablet, Rfl: 3  Past Medical History: Past Medical History:  Diagnosis Date  . CAD (coronary artery disease), native coronary artery    chronically occluded mid LAD with left to left collaterals.   . Cerebral aneurysm    s/p embolization  . Chronic systolic (congestive) heart failure (HCC)    biventricular HF with moderate RV dysfunction and severe LV dysfunction by Cardiac MRI with EF 22% no viability in the apical septal/apical/inferior,mid anterior and true apex.    . Gout   . HLD (hyperlipidemia)   . PAF (paroxysmal atrial fibrillation) (HCC)    s/p DCCV 12/2019  . Pre-diabetes    diet controlled, no meds    Tobacco Use: Social History   Tobacco Use  Smoking Status Former Smoker  . Packs/day: 0.25  . Years: 3.00  . Pack years: 0.75  . Types: Cigarettes  . Quit date: 08/17/2011  . Years since quitting: 8.8  Smokeless Tobacco Never Used    Labs: Recent Review Flowsheet Data    Labs for ITP Cardiac and Pulmonary Rehab Latest Ref Rng & Units 11/06/2019 11/07/2019 11/12/2019 11/12/2019 06/10/2020   Cholestrol 0 - 200 mg/dL - 388 - - 828   LDLCALC 0 - 99 mg/dL - 56 - - 68   HDL >00 mg/dL - 34(J) - - 49   Trlycerides <150 mg/dL - 36 - - 99   Hemoglobin  A1c 4.8 - 5.6 % - - - - -   PHART 7.35 - 7.45 7.364 - 7.452(H) - -   PCO2ART 32 - 48 mmHg 81.3(HH) - 51.1(H) - -   HCO3 20.0 - 28.0 mmol/L 44.6(H) - 35.7(H) 37.6(H) -   TCO2 22 - 32 mmol/L - - 37(H) 39(H) -   O2SAT % 93.9 - 97.0 63.0 -      Capillary Blood Glucose: Lab Results  Component Value Date   GLUCAP 152 (H) 11/12/2019   GLUCAP 123 (H) 11/11/2019   GLUCAP 155 (H) 11/11/2019   GLUCAP 127 (H) 11/06/2019     Exercise Target Goals: Exercise Program Goal: Individual exercise prescription set using results from initial 6 min walk test and THRR while considering  patient's activity barriers and safety.   Exercise Prescription Goal: Starting with aerobic activity 30 plus minutes a day, 3 days per week for initial  exercise prescription. Provide home exercise prescription and guidelines that participant acknowledges understanding prior to discharge.  Activity Barriers & Risk Stratification:  Activity Barriers & Cardiac Risk Stratification - 07/03/20 1353      Activity Barriers & Cardiac Risk Stratification   Activity Barriers Arthritis;Joint Problems;Deconditioning;Shortness of Breath;Decreased Ventricular Function;Balance Concerns    Cardiac Risk Stratification High           6 Minute Walk:  6 Minute Walk    Row Name 07/03/20 1223         6 Minute Walk   Phase Initial     Distance 832 feet  Half way pateint given wheelchair to push due to knee pain     Walk Time 6 minutes     # of Rest Breaks 0     MPH 1.57     METS 2.17     RPE 13     Perceived Dyspnea  1     VO2 Peak 7.61     Symptoms Yes (comment)     Comments SOB, RPD =1; Bilateral knee pain (8 out of 10)     Resting HR 87 bpm     Resting BP 99/59     Resting Oxygen Saturation  97 %     Exercise Oxygen Saturation  during 6 min walk 97 %     Max Ex. HR 113 bpm     Max Ex. BP 135/80     2 Minute Post BP 118/82            Oxygen Initial Assessment:   Oxygen Re-Evaluation:   Oxygen Discharge (Final Oxygen Re-Evaluation):   Initial Exercise Prescription:  Initial Exercise Prescription - 07/03/20 1300      Date of Initial Exercise RX and Referring Provider   Date 07/03/20    Referring Provider Laurey Morale, MD    Expected Discharge Date 08/29/20      NuStep   Level 1    SPM 85    Minutes 25    METs 1.7      Prescription Details   Frequency (times per week) 3    Duration Progress to 30 minutes of continuous aerobic without signs/symptoms of physical distress      Intensity   THRR 40-80% of Max Heartrate 63-126    Ratings of Perceived Exertion 11-13    Perceived Dyspnea 0-4      Progression   Progression Continue progressive overload as per policy without signs/symptoms or physical distress.       Resistance Training   Training Prescription Yes  Weight 3 lbs    Reps 10-15           Perform Capillary Blood Glucose checks as needed.  Exercise Prescription Changes:   Exercise Comments:   Exercise Goals and Review:  Exercise Goals    Row Name 07/03/20 1354             Exercise Goals   Increase Physical Activity Yes       Intervention Provide advice, education, support and counseling about physical activity/exercise needs.;Develop an individualized exercise prescription for aerobic and resistive training based on initial evaluation findings, risk stratification, comorbidities and participant's personal goals.       Expected Outcomes Short Term: Attend rehab on a regular basis to increase amount of physical activity.;Long Term: Add in home exercise to make exercise part of routine and to increase amount of physical activity.;Long Term: Exercising regularly at least 3-5 days a week.       Increase Strength and Stamina Yes       Intervention Provide advice, education, support and counseling about physical activity/exercise needs.;Develop an individualized exercise prescription for aerobic and resistive training based on initial evaluation findings, risk stratification, comorbidities and participant's personal goals.       Expected Outcomes Short Term: Perform resistance training exercises routinely during rehab and add in resistance training at home;Short Term: Increase workloads from initial exercise prescription for resistance, speed, and METs.;Long Term: Improve cardiorespiratory fitness, muscular endurance and strength as measured by increased METs and functional capacity ( )       Able to understand and use rate of perceived exertion (RPE) scale Yes       Intervention Provide education and explanation on how to use RPE scale       Expected Outcomes Short Term: Able to use RPE daily in rehab to express subjective intensity level;Long Term:  Able to use RPE to guide intensity  level when exercising independently       Knowledge and understanding of Target Heart Rate Range (THRR) Yes       Intervention Provide education and explanation of THRR including how the numbers were predicted and where they are located for reference       Expected Outcomes Short Term: Able to state/look up THRR;Short Term: Able to use daily as guideline for intensity in rehab;Long Term: Able to use THRR to govern intensity when exercising independently       Understanding of Exercise Prescription Yes       Intervention Provide education, explanation, and written materials on patient's individual exercise prescription       Expected Outcomes Short Term: Able to explain program exercise prescription;Long Term: Able to explain home exercise prescription to exercise independently              Exercise Goals Re-Evaluation :    Discharge Exercise Prescription (Final Exercise Prescription Changes):   Nutrition:  Target Goals: Understanding of nutrition guidelines, daily intake of sodium 1500mg , cholesterol 200mg , calories 30% from fat and 7% or less from saturated fats, daily to have 5 or more servings of fruits and vegetables.  Biometrics:  Pre Biometrics - 07/03/20 1100      Pre Biometrics   Waist Circumference 48 inches    Hip Circumference 47 inches    Waist to Hip Ratio 1.02 %    Triceps Skinfold 37 mm    % Body Fat 37.7 %    Grip Strength 45 kg    Flexibility 11.25 in    Single Leg Stand  2.02 seconds   High risk for Fall           Nutrition Therapy Plan and Nutrition Goals:   Nutrition Assessments:  MEDIFICTS Score Key:  ?70 Need to make dietary changes   40-70 Heart Healthy Diet  ? 40 Therapeutic Level Cholesterol Diet   Picture Your Plate Scores:  <90 Unhealthy dietary pattern with much room for improvement.  41-50 Dietary pattern unlikely to meet recommendations for good health and room for improvement.  51-60 More healthful dietary pattern, with some  room for improvement.   >60 Healthy dietary pattern, although there may be some specific behaviors that could be improved.    Nutrition Goals Re-Evaluation:   Nutrition Goals Discharge (Final Nutrition Goals Re-Evaluation):   Psychosocial: Target Goals: Acknowledge presence or absence of significant depression and/or stress, maximize coping skills, provide positive support system. Participant is able to verbalize types and ability to use techniques and skills needed for reducing stress and depression.  Initial Review & Psychosocial Screening:  Initial Psych Review & Screening - 07/04/20 0807      Initial Review   Current issues with Current Stress Concerns    Source of Stress Concerns Financial;Unable to participate in former interests or hobbies;Unable to perform yard/household activities;Chronic Illness;Retirement/disability    Comments Glen's job recently closed he is concerned about his health insurance and finances      Family Dynamics   Good Support System? Yes      Barriers   Psychosocial barriers to participate in program The patient should benefit from training in stress management and relaxation.      Screening Interventions   Interventions Encouraged to exercise;Provide feedback about the scores to participant;To provide support and resources with identified psychosocial needs    Expected Outcomes Short Term goal: Utilizing psychosocial counselor, staff and physician to assist with identification of specific Stressors or current issues interfering with healing process. Setting desired goal for each stressor or current issue identified.;Long Term Goal: Stressors or current issues are controlled or eliminated.;Short Term goal: Identification and review with participant of any Quality of Life or Depression concerns found by scoring the questionnaire.           Quality of Life Scores:  Quality of Life - 07/03/20 1346      Quality of Life   Select Quality of Life       Quality of Life Scores   Health/Function Pre 18.6 %    Socioeconomic Pre 22.94 %    Psych/Spiritual Pre 22.33 %    Family Pre 22.5 %    GLOBAL Pre 20.8 %          Scores of 19 and below usually indicate a poorer quality of life in these areas.  A difference of  2-3 points is a clinically meaningful difference.  A difference of 2-3 points in the total score of the Quality of Life Index has been associated with significant improvement in overall quality of life, self-image, physical symptoms, and general health in studies assessing change in quality of life.  PHQ-9: Recent Review Flowsheet Data   There is no flowsheet data to display.    Interpretation of Total Score  Total Score Depression Severity:  1-4 = Minimal depression, 5-9 = Mild depression, 10-14 = Moderate depression, 15-19 = Moderately severe depression, 20-27 = Severe depression   Psychosocial Evaluation and Intervention:   Psychosocial Re-Evaluation:   Psychosocial Discharge (Final Psychosocial Re-Evaluation):   Vocational Rehabilitation: Provide vocational rehab assistance to qualifying candidates.  Vocational Rehab Evaluation & Intervention:  Vocational Rehab - 07/04/20 0837      Initial Vocational Rehab Evaluation & Intervention   Assessment shows need for Vocational Rehabilitation No           Education: Education Goals: Education classes will be provided on a weekly basis, covering required topics. Participant will state understanding/return demonstration of topics presented.  Learning Barriers/Preferences:  Learning Barriers/Preferences - 07/03/20 1347      Learning Barriers/Preferences   Learning Barriers Sight   Wears reading glasses   Learning Preferences Audio;Computer/Internet;Group Instruction;Individual Instruction;Pictoral;Written Material;Video;Skilled Demonstration;Verbal Instruction           Education Topics: Hypertension, Hypertension Reduction -Define heart disease and high  blood pressure. Discus how high blood pressure affects the body and ways to reduce high blood pressure.   Exercise and Your Heart -Discuss why it is important to exercise, the FITT principles of exercise, normal and abnormal responses to exercise, and how to exercise safely.   Angina -Discuss definition of angina, causes of angina, treatment of angina, and how to decrease risk of having angina.   Cardiac Medications -Review what the following cardiac medications are used for, how they affect the body, and side effects that may occur when taking the medications.  Medications include Aspirin, Beta blockers, calcium channel blockers, ACE Inhibitors, angiotensin receptor blockers, diuretics, digoxin, and antihyperlipidemics.   Congestive Heart Failure -Discuss the definition of CHF, how to live with CHF, the signs and symptoms of CHF, and how keep track of weight and sodium intake.   Heart Disease and Intimacy -Discus the effect sexual activity has on the heart, how changes occur during intimacy as we age, and safety during sexual activity.   Smoking Cessation / COPD -Discuss different methods to quit smoking, the health benefits of quitting smoking, and the definition of COPD.   Nutrition I: Fats -Discuss the types of cholesterol, what cholesterol does to the heart, and how cholesterol levels can be controlled.   Nutrition II: Labels -Discuss the different components of food labels and how to read food label   Heart Parts/Heart Disease and PAD -Discuss the anatomy of the heart, the pathway of blood circulation through the heart, and these are affected by heart disease.   Stress I: Signs and Symptoms -Discuss the causes of stress, how stress may lead to anxiety and depression, and ways to limit stress.   Stress II: Relaxation -Discuss different types of relaxation techniques to limit stress.   Warning Signs of Stroke / TIA -Discuss definition of a stroke, what the signs  and symptoms are of a stroke, and how to identify when someone is having stroke.   Knowledge Questionnaire Score:  Knowledge Questionnaire Score - 07/03/20 1348      Knowledge Questionnaire Score   Pre Score 21/24           Core Components/Risk Factors/Patient Goals at Admission:  Personal Goals and Risk Factors at Admission - 07/04/20 0837      Core Components/Risk Factors/Patient Goals on Admission    Weight Management Yes;Weight Loss;Obesity    Intervention Weight Management: Develop a combined nutrition and exercise program designed to reach desired caloric intake, while maintaining appropriate intake of nutrient and fiber, sodium and fats, and appropriate energy expenditure required for the weight goal.;Weight Management: Provide education and appropriate resources to help participant work on and attain dietary goals.;Weight Management/Obesity: Establish reasonable short term and long term weight goals.;Obesity: Provide education and appropriate resources to help participant work on and  attain dietary goals.    Admit Weight 258 lb 6.1 oz (117.2 kg)    Expected Outcomes Short Term: Continue to assess and modify interventions until short term weight is achieved;Weight Maintenance: Understanding of the daily nutrition guidelines, which includes 25-35% calories from fat, 7% or less cal from saturated fats, less than 200mg  cholesterol, less than 1.5gm of sodium, & 5 or more servings of fruits and vegetables daily;Long Term: Adherence to nutrition and physical activity/exercise program aimed toward attainment of established weight goal;Weight Loss: Understanding of general recommendations for a balanced deficit meal plan, which promotes 1-2 lb weight loss per week and includes a negative energy balance of [phone] kcal/d;Understanding recommendations for meals to include 15-35% energy as protein, 25-35% energy from fat, 35-60% energy from carbohydrates, less than 200mg  of dietary cholesterol,  20-35 gm of total fiber daily;Understanding of distribution of calorie intake throughout the day with the consumption of 4-5 meals/snacks    Diabetes Yes    Intervention Provide education about signs/symptoms and action to take for hypo/hyperglycemia.;Provide education about proper nutrition, including hydration, and aerobic/resistive exercise prescription along with prescribed medications to achieve blood glucose in normal ranges: Fasting glucose 65-99 mg/dL    Expected Outcomes Short Term: Participant verbalizes understanding of the signs/symptoms and immediate care of hyper/hypoglycemia, proper foot care and importance of medication, aerobic/resistive exercise and nutrition plan for blood glucose control.;Long Term: Attainment of HbA1C < 7%.    Heart Failure Yes    Intervention Provide a combined exercise and nutrition program that is supplemented with education, support and counseling about heart failure. Directed toward relieving symptoms such as shortness of breath, decreased exercise tolerance, and extremity edema.    Expected Outcomes Improve functional capacity of life;Short term: Attendance in program 2-3 days a week with increased exercise capacity. Reported lower sodium intake. Reported increased fruit and vegetable intake. Reports medication compliance.;Short term: Daily weights obtained and reported for increase. Utilizing diuretic protocols set by physician.;Long term: Adoption of self-care skills and reduction of barriers for early signs and symptoms recognition and intervention leading to self-care maintenance.    Lipids Yes    Intervention Provide education and support for participant on nutrition & aerobic/resistive exercise along with prescribed medications to achieve LDL 70mg , HDL >40mg .    Expected Outcomes Short Term: Participant states understanding of desired cholesterol values and is compliant with medications prescribed. Participant is following exercise prescription and  nutrition guidelines.;Long Term: Cholesterol controlled with medications as prescribed, with individualized exercise RX and with personalized nutrition plan. Value goals: LDL < 70mg , HDL > 40 mg.    Stress Yes    Intervention Offer individual and/or small group education and counseling on adjustment to heart disease, stress management and health-related lifestyle change. Teach and support self-help strategies.;Refer participants experiencing significant psychosocial distress to appropriate mental health specialists for further evaluation and treatment. When possible, include family members and significant others in education/counseling sessions.    Expected Outcomes Short Term: Participant demonstrates changes in health-related behavior, relaxation and other stress management skills, ability to obtain effective social support, and compliance with psychotropic medications if prescribed.;Long Term: Emotional wellbeing is indicated by absence of clinically significant psychosocial distress or social isolation.           Core Components/Risk Factors/Patient Goals Review:    Core Components/Risk Factors/Patient Goals at Discharge (Final Review):    ITP Comments:  ITP Comments    Row Name 07/04/20 0734           ITP Comments Dr  Armanda Magic MD, Medical Director              Comments:Glen attended orientation on 07/04/2020 to review rules and guidelines for program.  Completed 6 minute walk test, Intitial ITP, and exercise prescription.  VSS. Telemetry-Sinus Rhythm Bundle Branch Block POD.  Algernon Huxley reported having chronic knee pain 8/10 and was given a wheel chair to push for stability the second half of his walk test.  Patient is deconditioned. Algernon Huxley reported having mild shortness of breath during the walk test.. Safety measures and social distancing in place per CDC guidelines.[name], RN,BSN 07/04/2020 8:45 AM

## 2020-07-04 NOTE — Telephone Encounter (Signed)
Samara Snide, RN  07/04/2020 10:47 AM EST Back to Top    Patient advised and verbalized understanding   Philicia Glade Lloyd, CMA  07/02/2020 12:24 PM EST     [facility]

## 2020-07-04 NOTE — Telephone Encounter (Signed)
-----   Message from Laurey Morale, MD sent at 07/02/2020 10:04 AM EST ----- Low normal functional capacity, suspect there is some deconditioning.

## 2020-07-07 ENCOUNTER — Other Ambulatory Visit: Payer: Self-pay

## 2020-07-07 ENCOUNTER — Ambulatory Visit (HOSPITAL_COMMUNITY)
Admission: RE | Admit: 2020-07-07 | Discharge: 2020-07-07 | Disposition: A | Discharge status: Home / Self Care | Payer: Commercial Managed Care - PPO | Source: Ambulatory Visit | Attending: Pharmacist | Admitting: Pharmacist

## 2020-07-07 VITALS — BP 110/62 | HR 87 | Wt 257.8 lb

## 2020-07-07 DIAGNOSIS — I255 Ischemic cardiomyopathy: Secondary | ICD-10-CM | POA: Diagnosis not present

## 2020-07-07 DIAGNOSIS — I671 Cerebral aneurysm, nonruptured: Secondary | ICD-10-CM | POA: Diagnosis not present

## 2020-07-07 DIAGNOSIS — Z79899 Other long term (current) drug therapy: Secondary | ICD-10-CM | POA: Diagnosis not present

## 2020-07-07 DIAGNOSIS — Z5181 Encounter for therapeutic drug level monitoring: Secondary | ICD-10-CM | POA: Diagnosis not present

## 2020-07-07 DIAGNOSIS — Z7901 Long term (current) use of anticoagulants: Secondary | ICD-10-CM | POA: Diagnosis not present

## 2020-07-07 DIAGNOSIS — I5022 Chronic systolic (congestive) heart failure: Secondary | ICD-10-CM | POA: Insufficient documentation

## 2020-07-07 DIAGNOSIS — I480 Paroxysmal atrial fibrillation: Secondary | ICD-10-CM | POA: Insufficient documentation

## 2020-07-07 DIAGNOSIS — Z7982 Long term (current) use of aspirin: Secondary | ICD-10-CM | POA: Diagnosis not present

## 2020-07-07 DIAGNOSIS — I2510 Atherosclerotic heart disease of native coronary artery without angina pectoris: Secondary | ICD-10-CM | POA: Diagnosis not present

## 2020-07-07 DIAGNOSIS — Z9989 Dependence on other enabling machines and devices: Secondary | ICD-10-CM | POA: Insufficient documentation

## 2020-07-07 DIAGNOSIS — G4733 Obstructive sleep apnea (adult) (pediatric): Secondary | ICD-10-CM | POA: Insufficient documentation

## 2020-07-07 MED ORDER — SPIRONOLACTONE 25 MG PO TABS: 25.0000 mg | ORAL_TABLET | Freq: Every day | ORAL | 3 refills | Status: DC

## 2020-07-07 NOTE — Telephone Encounter (Addendum)
Patient has a 10 week follow up appointment scheduled for 12/9//21. Patient understands he needs to keep this appointment for insurance compliance. Patient was grateful for the call and thanked me.

## 2020-07-07 NOTE — Patient Instructions (Addendum)
It was a pleasure seeing you today!  MEDICATIONS: -We are changing your medications today -Increase spironolactone to 25 mg (1 tablet) daily -Call if you have questions about your medications.  NEXT APPOINTMENT: Return to clinic in 2 weeks with Dr. Shirlee Latch.  In general, to take care of your heart failure: -Limit your fluid intake to 2 Liters (half-gallon) per day.   -Limit your salt intake to ideally 2-3 grams (2000-3000 mg) per day. -Weigh yourself daily and record, and bring that "weight diary" to your next appointment.  (Weight gain of 2-3 pounds in 1 day typically means fluid weight.) -The medications for your heart are to help your heart and help you live longer.   -Please contact us before stopping any of your heart medications.  Call the clinic at [phone] with questions or to reschedule future appointments.

## 2020-07-09 ENCOUNTER — Other Ambulatory Visit: Payer: Self-pay

## 2020-07-09 ENCOUNTER — Encounter (HOSPITAL_COMMUNITY)
Admission: RE | Admit: 2020-07-09 | Discharge: 2020-07-09 | Disposition: A | Discharge status: Home / Self Care | Payer: Commercial Managed Care - PPO | Source: Ambulatory Visit | Attending: Cardiology | Admitting: Cardiology

## 2020-07-09 DIAGNOSIS — I5022 Chronic systolic (congestive) heart failure: Secondary | ICD-10-CM | POA: Diagnosis not present

## 2020-07-09 NOTE — Progress Notes (Signed)
Daily Session Note  Patient Details  Name: Richard Graham MRN: [id number] Date of Birth: [birth year] Referring Provider:     CARDIAC REHAB PHASE II ORIENTATION from 07/03/2020 in [place]  Referring Provider Larey Dresser, MD      Encounter Date: 07/09/2020  Check In:  Session Check In - 07/09/20 1334      Check-In   Supervising physician immediately available to respond to emergencies [facility] immediately available    Physician(s) Dr. Tyrell Antonio    Location [facility]    Staff Present Lesly Rubenstein, MS, EP-C, CCRP;Tatyana Biber Rollene Rotunda, RN, Roque Cash, RN    Virtual Visit No    Medication changes reported     Yes    Fall or balance concerns reported    No    Tobacco Cessation No Change    Warm-up and Cool-down Performed on first and last piece of equipment    Resistance Training Performed No    VAD Patient? No    PAD/SET Patient? No      Pain Assessment   Currently in Pain? No/denies    Multiple Pain Sites No           Capillary Blood Glucose: No results found for this or any previous visit (from the past 24 hour(s)).    Social History   Tobacco Use  Smoking Status Former Smoker  . Packs/day: 0.25  . Years: 3.00  . Pack years: 0.75  . Types: Cigarettes  . Quit date: 08/17/2011  . Years since quitting: 8.9  Smokeless Tobacco Never Used    Goals Met:  Exercise tolerated well Personal goals reviewed No report of cardiac concerns or symptoms Strength training completed today  Goals Unmet:  Not Applicable  Comments: Pt started cardiac rehab today.  Pt tolerated light exercise without difficulty. VSS, telemetry-NSR, R BBB, asymptomatic.  Medication list reconciled. Pt denies barriers to medicaiton compliance.  PSYCHOSOCIAL ASSESSMENT:  PHQ-0. Pt exhibits positive coping skills, hopeful outlook with supportive family. No psychosocial needs identified at this time, no psychosocial interventions  necessary. Pt oriented to exercise equipment and routine. Understanding verbalized.   Dr. Fransico Him is Medical Director for Cardiac Rehab at [facility]

## 2020-07-11 ENCOUNTER — Encounter (HOSPITAL_COMMUNITY): Payer: Commercial Managed Care - PPO

## 2020-07-14 ENCOUNTER — Encounter (HOSPITAL_COMMUNITY)
Admission: RE | Admit: 2020-07-14 | Discharge: 2020-07-14 | Disposition: A | Discharge status: Home / Self Care | Payer: Commercial Managed Care - PPO | Source: Ambulatory Visit | Attending: Cardiology | Admitting: Cardiology

## 2020-07-14 ENCOUNTER — Other Ambulatory Visit: Payer: Self-pay

## 2020-07-14 DIAGNOSIS — I5022 Chronic systolic (congestive) heart failure: Secondary | ICD-10-CM

## 2020-07-14 NOTE — Progress Notes (Signed)
Cardiac Rehab Note:  QUALITY OF LIFE SCORE REVIEW  Pt completed Quality of Life survey as a participant in Cardiac Rehab.  Scores 21.0 or below are considered low.  Pt score very low in several areas Overall 20.80, Health and Function 18.60, socioeconomic 22.94, physiological and spiritual 22.33, family 22.50. Patient quality of life slightly altered by physical constraints which limits ability to perform as prior to recent cardiac illness.  He was working full time until his cardiac event. He is currently on long term disability and having a hard time adjusting to his inability to work at this time. Offered emotional support and reassurance.  Will continue to monitor and intervene as necessary.    Emmons Toth E. Suzie Portela RN, BSN [place]. [facility]  Cardiac and Pulmonary Rehabilitation Phone: [phone] Fax: [phone]

## 2020-07-16 ENCOUNTER — Encounter (HOSPITAL_COMMUNITY)
Admission: RE | Admit: 2020-07-16 | Discharge: 2020-07-16 | Disposition: A | Discharge status: Home / Self Care | Payer: Commercial Managed Care - PPO | Source: Ambulatory Visit | Attending: Cardiology | Admitting: Cardiology

## 2020-07-16 ENCOUNTER — Other Ambulatory Visit: Payer: Self-pay

## 2020-07-16 DIAGNOSIS — I5022 Chronic systolic (congestive) heart failure: Secondary | ICD-10-CM | POA: Insufficient documentation

## 2020-07-18 ENCOUNTER — Other Ambulatory Visit: Payer: Self-pay

## 2020-07-18 ENCOUNTER — Encounter (HOSPITAL_COMMUNITY)
Admission: RE | Admit: 2020-07-18 | Discharge: 2020-07-18 | Disposition: A | Discharge status: Home / Self Care | Payer: Commercial Managed Care - PPO | Source: Ambulatory Visit | Attending: Cardiology | Admitting: Cardiology

## 2020-07-18 VITALS — Wt 257.0 lb

## 2020-07-18 DIAGNOSIS — I5022 Chronic systolic (congestive) heart failure: Secondary | ICD-10-CM | POA: Diagnosis not present

## 2020-07-18 NOTE — Progress Notes (Signed)
Barbaraann Share 64 y.o. male Nutrition Note  Visit Diagnosis: Heart failure, chronic systolic [facility])  Past Medical History:  Diagnosis Date  . CAD (coronary artery disease), native coronary artery    chronically occluded mid LAD with left to left collaterals.   . Cerebral aneurysm    s/p embolization  . Chronic systolic (congestive) heart failure (HCC)    biventricular HF with moderate RV dysfunction and severe LV dysfunction by Cardiac MRI with EF 22% no viability in the apical septal/apical/inferior,mid anterior and true apex.    . Gout   . HLD (hyperlipidemia)   . PAF (paroxysmal atrial fibrillation) (HCC)    s/p DCCV 12/2019  . Pre-diabetes    diet controlled, no meds     Medications reviewed.   Current Outpatient Medications:  .  amiodarone (PACERONE) 200 MG tablet, Take 0.5 tablets (100 mg total) by mouth daily., Disp: 15 tablet, Rfl: 11 .  aspirin EC 81 MG tablet, Take 81 mg by mouth daily., Disp: , Rfl:  .  colchicine 0.6 MG tablet, Take 0.6 mg by mouth 2 (two) times daily as needed (for gout flares). , Disp: , Rfl:  .  diclofenac sodium (VOLTAREN) 1 % GEL, Apply 2 g topically 2 (two) times daily as needed (to affected sites- for arthritis pain). , Disp: , Rfl:  .  ELIQUIS 5 MG TABS tablet, TAKE 1 TABLET(5 MG) BY MOUTH TWICE DAILY (Patient taking differently: Take 5 mg by mouth 2 (two) times daily. ), Disp: 60 tablet, Rfl: 8 .  furosemide (LASIX) 40 MG tablet, Take 1 tablet (40 mg total) by mouth in the morning AND 0.5 tablets (20 mg total) every evening., Disp: 45 tablet, Rfl: 3 .  metoprolol succinate (TOPROL XL) 25 MG 24 hr tablet, Take 1 tablet (25 mg total) by mouth daily., Disp: 90 tablet, Rfl: 3 .  oxyCODONE-acetaminophen (PERCOCET/ROXICET) 5-325 MG tablet, Take 1 tablet by mouth in the morning, at noon, and at bedtime., Disp: , Rfl:  .  rosuvastatin (CRESTOR) 5 MG tablet, Take 1 tablet (5 mg total) by mouth daily., Disp: 90 tablet, Rfl: 3 .  sacubitril-valsartan  (ENTRESTO) 49-51 MG, Take 1 tablet by mouth 2 (two) times daily., Disp: 60 tablet, Rfl: 3 .  spironolactone (ALDACTONE) 25 MG tablet, Take 1 tablet (25 mg total) by mouth daily., Disp: 90 tablet, Rfl: 3   Ht Readings from Last 1 Encounters:  07/03/20 5' 10.75" (1.797 m)     Wt Readings from Last 3 Encounters:  07/07/20 257 lb 12.8 oz (116.9 kg)  07/03/20 258 lb 6.1 oz (117.2 kg)  06/24/20 260 lb 3.2 oz (118 kg)     There is no height or weight on file to calculate BMI.   Social History   Tobacco Use  Smoking Status Former Smoker  . Packs/day: 0.25  . Years: 3.00  . Pack years: 0.75  . Types: Cigarettes  . Quit date: 08/17/2011  . Years since quitting: 8.9  Smokeless Tobacco Never Used     Lab Results  Component Value Date   CHOL 137 06/10/2020   Lab Results  Component Value Date   HDL 49 06/10/2020   Lab Results  Component Value Date   LDLCALC 68 06/10/2020   Lab Results  Component Value Date   TRIG 99 06/10/2020     Lab Results  Component Value Date   HGBA1C 6.0 (H) 11/05/2019     CBG (last 3)  No results for input(s): GLUCAP in the last  72 hours.   Nutrition Note  Spoke with pt. Nutrition Plan and Nutrition Survey goals reviewed with pt.  MEDFICTS shows room for improvement with nutrition.  Pt states financial stress associated with procurement of medications and food.   Pt has Pre-diabetes. Last A1c indicates blood glucose well-controlled.   Pt with dx of CHF. Per discussion, pt does use canned/convenience foods often. Pt does add salt to food (he says small amount). Pt does not eat out frequently.   Reviewed lipid panel and a1c with pt.   Diet intake: He enjoys vegetables and tries to make them a priority.  Recently enjoyed more sweets due to the holidays. He would like to cut back on these. Fluids: orange juice sometimes, water, and 1-2 glasses whole milk daily Fats: he does use butter often - he has decided to switch to butter alternative.    He has not been weighing himself daily or taking daily BP.  He has scale and bp monitor.   Pt expressed understanding of the information reviewed.    Nutrition Diagnosis ? Excessive sodium intake related to over consumption of processed food as evidenced by frequent consumption of convenience food/ canned vegetables and pt report using salt shaker  ? Excessive carbohydrate intake related to food preferences and lack of food related knowledge as evidenced by pt report and A1C 6.0  Nutrition Intervention ? Pt's individual nutrition plan reviewed with pt. ? Benefits of adopting Heart Healthy diet discussed when Medficts reviewed. ? Continue client-centered nutrition education by RD, as part of interdisciplinary care.  Goal(s) ? Pt to build a healthy plate including vegetables, fruits, whole grains, and low-fat dairy products in a heart healthy meal plan. ? Pt to keep daily weight and BP log  ? Pt able to name foods that affect blood glucose ? Pt to reduce sodium intake <2000 mg/day  Plan:   Will provide client-centered nutrition education as part of interdisciplinary care  Monitor and evaluate progress toward nutrition goal with team.   Andrey Campanile, MS, RDN, LDN

## 2020-07-21 ENCOUNTER — Other Ambulatory Visit: Payer: Self-pay

## 2020-07-21 ENCOUNTER — Encounter (HOSPITAL_COMMUNITY)
Admission: RE | Admit: 2020-07-21 | Discharge: 2020-07-21 | Disposition: A | Discharge status: Home / Self Care | Payer: Commercial Managed Care - PPO | Source: Ambulatory Visit | Attending: Cardiology | Admitting: Cardiology

## 2020-07-21 DIAGNOSIS — I5022 Chronic systolic (congestive) heart failure: Secondary | ICD-10-CM | POA: Diagnosis not present

## 2020-07-23 ENCOUNTER — Ambulatory Visit (HOSPITAL_COMMUNITY)
Admission: RE | Admit: 2020-07-23 | Discharge: 2020-07-23 | Disposition: A | Discharge status: Home / Self Care | Payer: Commercial Managed Care - PPO | Source: Ambulatory Visit | Attending: Cardiology | Admitting: Cardiology

## 2020-07-23 ENCOUNTER — Other Ambulatory Visit: Payer: Self-pay | Admitting: Cardiology

## 2020-07-23 ENCOUNTER — Other Ambulatory Visit: Payer: Self-pay

## 2020-07-23 ENCOUNTER — Encounter (HOSPITAL_COMMUNITY): Payer: Self-pay | Admitting: Cardiology

## 2020-07-23 ENCOUNTER — Telehealth (HOSPITAL_COMMUNITY): Payer: Self-pay | Admitting: Cardiology

## 2020-07-23 ENCOUNTER — Encounter (HOSPITAL_COMMUNITY)
Admission: RE | Admit: 2020-07-23 | Discharge: 2020-07-23 | Disposition: A | Discharge status: Home / Self Care | Payer: Commercial Managed Care - PPO | Source: Ambulatory Visit | Attending: Cardiology | Admitting: Cardiology

## 2020-07-23 VITALS — BP 120/80 | HR 84 | Wt 253.8 lb

## 2020-07-23 DIAGNOSIS — I480 Paroxysmal atrial fibrillation: Secondary | ICD-10-CM | POA: Diagnosis not present

## 2020-07-23 DIAGNOSIS — Z79899 Other long term (current) drug therapy: Secondary | ICD-10-CM | POA: Diagnosis not present

## 2020-07-23 DIAGNOSIS — Z7984 Long term (current) use of oral hypoglycemic drugs: Secondary | ICD-10-CM | POA: Insufficient documentation

## 2020-07-23 DIAGNOSIS — Z87891 Personal history of nicotine dependence: Secondary | ICD-10-CM | POA: Diagnosis not present

## 2020-07-23 DIAGNOSIS — Z7901 Long term (current) use of anticoagulants: Secondary | ICD-10-CM | POA: Insufficient documentation

## 2020-07-23 DIAGNOSIS — I5022 Chronic systolic (congestive) heart failure: Secondary | ICD-10-CM

## 2020-07-23 DIAGNOSIS — G4733 Obstructive sleep apnea (adult) (pediatric): Secondary | ICD-10-CM | POA: Diagnosis not present

## 2020-07-23 DIAGNOSIS — I6011 Nontraumatic subarachnoid hemorrhage from right middle cerebral artery: Secondary | ICD-10-CM | POA: Insufficient documentation

## 2020-07-23 DIAGNOSIS — Z7982 Long term (current) use of aspirin: Secondary | ICD-10-CM | POA: Diagnosis not present

## 2020-07-23 DIAGNOSIS — I255 Ischemic cardiomyopathy: Secondary | ICD-10-CM | POA: Diagnosis not present

## 2020-07-23 DIAGNOSIS — I2510 Atherosclerotic heart disease of native coronary artery without angina pectoris: Secondary | ICD-10-CM | POA: Insufficient documentation

## 2020-07-23 DIAGNOSIS — Z86718 Personal history of other venous thrombosis and embolism: Secondary | ICD-10-CM | POA: Diagnosis not present

## 2020-07-23 LAB — BASIC METABOLIC PANEL
Anion gap: 11 (ref 5–15)
BUN: 23 mg/dL (ref 8–23)
CO2: 24 mmol/L (ref 22–32)
Calcium: 9.2 mg/dL (ref 8.9–10.3)
Chloride: 97 mmol/L — ABNORMAL LOW (ref 98–111)
Creatinine, Ser: 0.93 mg/dL (ref 0.61–1.24)
GFR, Estimated: >60 mL/min (ref >60)
Glucose, Bld: 119 mg/dL — ABNORMAL HIGH (ref 70–99)
Potassium: 4.6 mmol/L (ref 3.5–5.1)
Sodium: 132 mmol/L — ABNORMAL LOW (ref 135–145)

## 2020-07-23 MED ORDER — DAPAGLIFLOZIN PROPANEDIOL 10 MG PO TABS: 10.0000 mg | ORAL_TABLET | Freq: Every day | ORAL | 5 refills | Status: DC

## 2020-07-23 MED ORDER — METOPROLOL SUCCINATE ER 50 MG PO TB24
50.0000 mg | ORAL_TABLET | Freq: Every day | ORAL | 5 refills | Status: DC
Start: 2020-07-23 — End: 2020-10-14

## 2020-07-23 NOTE — Progress Notes (Addendum)
CSW consulted to speak with pt about current insurance concerns.  Pt states that he was working until hospital stay in March and has been unable to work since.  Was officially let go from work in October and has been on [employer] since that time which he hasn't had to pay for as company is covering it.  States that starting in January he will likely have to start paying and it will be about $1,000/month.  Is also hoping that his wife will get new job with insurance benefits available- she is currently looking but unsure at this time.  CSW explained that pt only option at this time would be to continue with COBRA benefits or to look into [facility] insurance- information packet supplied.  Explained he can look online to compare plans and see if one might be more affordable for him than continuing COBRA plan.    Patient making too much right now to consider Medicaid (states he is getting $2,000 in long term disability from his work)  Patient also inquired about outstanding bills and bills from [facility] that have gone to collections- pt reports he needs to call [facility] to discuss payment plan and next steps- provided with number.  Also informed pt that his bills since November are stating self-pay so he needs to call billing to inform of his COBRA insurance so that they can bill it properly and hopefully take care of some of his outstanding bills.  No further questions at this time  Burna Sis, LCSW Clinical Social Worker [facility] Desk#: [phone] Cell#: [phone]

## 2020-07-23 NOTE — Patient Instructions (Signed)
START Farxiga 10mg  (1 tab) twice a day   INCREASE Toprol XL to 50mg  (1 tab) daily    Labs today and repeat in 10 days We will only contact you if something comes back abnormal or we need to make some changes. Otherwise no news is good news!   Your physician recommends that you schedule a follow-up appointment in: 1 month with Pharmacist and 2 months with Dr  Please call office at [phone] option 2 if you have any questions or concerns.   At the [facility], you and your health needs are our priority. As part of our continuing mission to provide you with exceptional heart care, we have created designated Provider Care Teams. These Care Teams include your primary Cardiologist (physician) and Advanced Practice Providers (APPs- Physician Assistants and Nurse Practitioners) who all work together to provide you with the care you need, when you need it.   You may see any of the following providers on your designated Care Team at your next follow up: Shirlee Latch Dr [phone] . Dr Marland Kitchen . Arvilla Meres, NP . Marca Ancona, PA . Tonye Becket, PharmD   Please be sure to bring in all your medications bottles to every appointment.

## 2020-07-24 ENCOUNTER — Encounter: Payer: Self-pay | Admitting: Cardiology

## 2020-07-24 ENCOUNTER — Ambulatory Visit (INDEPENDENT_AMBULATORY_CARE_PROVIDER_SITE_OTHER): Payer: Commercial Managed Care - PPO | Admitting: Cardiology

## 2020-07-24 VITALS — BP 110/62 | HR 78 | Ht 70.75 in | Wt 256.4 lb

## 2020-07-24 DIAGNOSIS — G4733 Obstructive sleep apnea (adult) (pediatric): Secondary | ICD-10-CM | POA: Diagnosis not present

## 2020-07-24 DIAGNOSIS — I4819 Other persistent atrial fibrillation: Secondary | ICD-10-CM

## 2020-07-24 NOTE — Patient Instructions (Signed)
Medication Instructions:  Your physician recommends that you continue on your current medications as directed. Please refer to the Current Medication list given to you today.  *If you need a refill on your cardiac medications before your next appointment, please call your pharmacy*  Follow-Up: At [facility], you and your health needs are our priority.  As part of our continuing mission to provide you with exceptional heart care, we have created designated Provider Care Teams.  These Care Teams include your primary Cardiologist (physician) and Advanced Practice Providers (APPs -  Physician Assistants and Nurse Practitioners) who all work together to provide you with the care you need, when you need it.  We recommend signing up for the patient portal called "MyChart".  Sign up information is provided on this After Visit Summary.  MyChart is used to connect with patients for Virtual Visits (Telemedicine).  Patients are able to view lab/test results, encounter notes, upcoming appointments, etc.  Non-urgent messages can be sent to your provider as well.   To learn more about what you can do with MyChart, go to [email or web address].    Your next appointment:   1 year(s)  The format for your next appointment:   In Person  Provider:   You may see Armanda Magic, MD or one of the following Advanced Practice Providers on your designated Care Team:    Ronie Spies, PA-C  Jacolyn Reedy, PA-C   Other Instructions You have been referred to the Healthy Weight and Wellness Program

## 2020-07-24 NOTE — Progress Notes (Signed)
Cardiology Office Note:    Date:  07/24/2020   ID:  Richard Graham, DOB 06-22-1956, MRN [id number]  PCP:  Charlane FerrettiSkakle, [place], DO  Cardiologist:  Armanda Magicraci Payden Bonus, MD    Referring MD: Charlane FerrettiSkakle, [place], DO   Chief Complaint  Patient presents with  . Sleep Apnea  . Atrial Fibrillation    History of Present Illness:    Richard ShareGlenn J Ardito is a 64 y.o. male with a hx of atrial fibrillation,systolic heart failure, cerebral aneurysm, CAD (CTO of LAD)and prediabetes.  He was initially seen 11/05/2019 when he presented to ER with significant weight gain, lower extremity edema, orthopnea and dyspnea on exertion. He was found to be in new onset atrial fibrillation with RVR, and new systolic heart failure with an EF of 20 to 25%. Underwent cardiac catheterization with chronically occluded mid LAD with left to left collaterals. Plan was for medical therapy but at some point could consider viability evaluation with possible CTO intervention. An incidental finding of a cerebral aneurysm was found during this admission.   He then presented 11/22/2019 for right common carotid arteriogram followed by embolization of the right MCA bifurcation aneurysm by Dr. Bedelia Personevenshwar with no complications. He was started on Apixaban and after 4 weeks underwent DCCV to NSR on 12/28/2019.  He was seen back in Afib clinic 01/04/20 and continued on Lopressor 50mg  BID and Amio 200mg  daily and was maintaining NSR.  He was cautioned about ETOH intake.    Repeat 2D echo showed persistently reduced LVF with EF 20-25% with severe LVE and mid to apical anteroseptal/apical and anterior AK.  There was also moderately reduced RVF and  Moderate pulmonary HTN with PASP 42mmHg.  Cardiac MRI demonstrated severe LVE with severe LV dysfunction with EF 22% and apical aneurysm involving all apical segments and mid anterior wall.  There was no evidence of  viability in most of the segments supplied by mid and apical LAD (mid anterior and apical septal,  anterior, interior walls and in the true apex - total of 5segments).  There was also moderate RV dysfunction, and severe LAE.  He is now followed by AHF service.   He is here today for sleep followup.  He underwent split night sleep study in Oct 2021 and showed severe OSA with an AHI of 32.8/hr and nocturnal hypoxemia and was started on CPAP auto from 4 to 20cm H2O.  He is doing well with his CPAP device and thinks that he has gotten used to it. He goes to bed at varying times but tries to be in bed asleep by 11pm-12MN and gets up to use the restroom a few times a night and then gets up around 8-9am.  He tolerates the mask and feels the pressure is adequate.  He tells me that he has been on sleep aides in the past and slept better.  He denies any significant mouth or nasal dryness or nasal congestion.  He does not think that he snores.     Past Medical History:  Diagnosis Date  . CAD (coronary artery disease), native coronary artery    chronically occluded mid LAD with left to left collaterals.   . Cerebral aneurysm    s/p embolization  . Chronic systolic (congestive) heart failure (HCC)    biventricular HF with moderate RV dysfunction and severe LV dysfunction by Cardiac MRI with EF 22% no viability in the apical septal/apical/inferior,mid anterior and true apex.    . Gout   . HLD (hyperlipidemia)   .  PAF (paroxysmal atrial fibrillation) (HCC)    s/p DCCV 12/2019  . Pre-diabetes    diet controlled, no meds    Past Surgical History:  Procedure Laterality Date  . CARDIAC CATHETERIZATION    . CARDIOVERSION N/A 12/28/2019   Procedure: CARDIOVERSION;  Surgeon: Chilton Si, MD;  Location: [facility] ENDOSCOPY;  Service: Cardiovascular;  Laterality: N/A;  . COLONOSCOPY    . ICD IMPLANT N/A 03/20/2020   Procedure: ICD IMPLANT;  Surgeon: Marinus Maw, MD;  Location: [facility] INVASIVE CV LAB;  Service: Cardiovascular;  Laterality: N/A;  . INSERTION OF MESH N/A 06/27/2015   Procedure: INSERTION OF MESH;   Surgeon: Axel Filler, MD;  Location: [facility] ORS;  Service: General;  Laterality: N/A;  . IR 3D INDEPENDENT WKST  11/14/2019  . IR 3D INDEPENDENT WKST  11/22/2019  . IR ANGIO INTRA EXTRACRAN SEL COM CAROTID INNOMINATE BILAT MOD SED  11/14/2019  . IR ANGIO INTRA EXTRACRAN SEL INTERNAL CAROTID UNI R MOD SED  11/22/2019  . IR ANGIO VERTEBRAL SEL VERTEBRAL BILAT MOD SED  11/14/2019  . IR ANGIOGRAM FOLLOW UP STUDY  11/22/2019  . IR CT HEAD LTD  11/22/2019  . IR NEURO EACH ADD'L AFTER BASIC UNI RIGHT (MS)  11/22/2019  . IR TRANSCATH/EMBOLIZ  11/22/2019  . laceration to fingers     left hand  . MOUTH SURGERY     secondary to abscess   . RADIOLOGY WITH ANESTHESIA N/A 11/22/2019   Procedure: EMBOLIZATION;  Surgeon: Julieanne Cotton, MD;  Location: [facility] OR;  Service: Radiology;  Laterality: N/A;  . RIGHT/LEFT HEART CATH AND CORONARY ANGIOGRAPHY N/A 11/12/2019   Procedure: RIGHT/LEFT HEART CATH AND CORONARY ANGIOGRAPHY;  Surgeon: Corky Crafts, MD;  Location: [facility] INVASIVE CV LAB;  Service: Cardiovascular;  Laterality: N/A;  . trauma to lip      sutured  . VENTRAL HERNIA REPAIR N/A 06/27/2015   Procedure: LAPAROSCOPIC VENTRAL HERNIA;  Surgeon: Axel Filler, MD;  Location: [facility] ORS;  Service: General;  Laterality: N/A;    Current Medications: Current Meds  Medication Sig  . amiodarone (PACERONE) 200 MG tablet Take 0.5 tablets (100 mg total) by mouth daily.  Marland Kitchen aspirin EC 81 MG tablet Take 81 mg by mouth daily.  . colchicine 0.6 MG tablet Take 0.6 mg by mouth 2 (two) times daily as needed (for gout flares).   . dapagliflozin propanediol (FARXIGA) 10 MG TABS tablet Take 1 tablet (10 mg total) by mouth daily before breakfast.  . diclofenac sodium (VOLTAREN) 1 % GEL Apply 2 g topically 2 (two) times daily as needed (to affected sites- for arthritis pain).   Marland Kitchen ELIQUIS 5 MG TABS tablet TAKE 1 TABLET(5 MG) BY MOUTH TWICE DAILY (Patient taking differently: Take 5 mg by mouth 2 (two) times daily.)  . furosemide  (LASIX) 40 MG tablet Take 1 tablet (40 mg total) by mouth in the morning AND 0.5 tablets (20 mg total) every evening.  . metoprolol succinate (TOPROL XL) 50 MG 24 hr tablet Take 1 tablet (50 mg total) by mouth daily.  Marland Kitchen oxyCODONE-acetaminophen (PERCOCET/ROXICET) 5-325 MG tablet Take 1 tablet by mouth in the morning, at noon, and at bedtime.  . rosuvastatin (CRESTOR) 5 MG tablet Take 1 tablet (5 mg total) by mouth daily.  . sacubitril-valsartan (ENTRESTO) 49-51 MG Take 1 tablet by mouth 2 (two) times daily.  Marland Kitchen spironolactone (ALDACTONE) 25 MG tablet Take 1 tablet (25 mg total) by mouth daily.     Allergies:   Patient has no known  allergies.   Social History   Socioeconomic History  . Marital status: Married    Spouse name: Not on file  . Number of children: Not on file  . Years of education: Not on file  . Highest education level: Some college, no degree  Occupational History  . Not on file  Tobacco Use  . Smoking status: Former Smoker    Packs/day: 0.25    Years: 3.00    Pack years: 0.75    Types: Cigarettes    Quit date: 08/17/2011    Years since quitting: 8.9  . Smokeless tobacco: Never Used  Vaping Use  . Vaping Use: Never used  Substance and Sexual Activity  . Alcohol use: Yes    Alcohol/week: 1.0 standard drink    Types: 1 Glasses of wine per week    Comment: occasional wine  . Drug use: No  . Sexual activity: Yes  Other Topics Concern  . Not on file  Social History Narrative  . Not on file   Social Determinants of Health   Financial Resource Strain: Not on file  Food Insecurity: Not on file  Transportation Needs: Not on file  Physical Activity: Not on file  Stress: Not on file  Social Connections: Not on file     Family History: The patient's family history is not on file.  ROS:   Please see the history of present illness.    ROS  All other systems reviewed and negative.   EKGs/Labs/Other Studies Reviewed:    The following studies were reviewed  today: Split night sleep study and PAP compliance download  EKG:  EKG is not ordered today.    Recent Labs: 11/05/2019: B Natriuretic Peptide 318.7 11/14/2019: Magnesium 2.3 02/28/2020: Hemoglobin 15.0; Platelets 260 06/10/2020: ALT 20; TSH 1.768 07/23/2020: BUN 23; Creatinine, Ser 0.93; Potassium 4.6; Sodium 132   Recent Lipid Panel    Component Value Date/Time   CHOL 137 06/10/2020 1258   TRIG 99 06/10/2020 1258   HDL 49 06/10/2020 1258   CHOLHDL 2.8 06/10/2020 1258   VLDL 20 06/10/2020 1258   LDLCALC 68 06/10/2020 1258    Physical Exam:    VS:  BP 110/62   Pulse 78   Ht 5' 10.75" (1.797 m)   Wt 256 lb 6.4 oz (116.3 kg)   SpO2 96%   BMI 36.01 kg/m     Wt Readings from Last 3 Encounters:  07/24/20 256 lb 6.4 oz (116.3 kg)  07/23/20 253 lb 12.8 oz (115.1 kg)  07/21/20 257 lb (116.6 kg)     GEN: Well nourished, well developed in no acute distress HEENT: Normal NECK: No JVD; No carotid bruits LYMPHATICS: No lymphadenopathy CARDIAC:RRR, no murmurs, rubs, gallops RESPIRATORY:  Clear to auscultation without rales, wheezing or rhonchi  ABDOMEN: Soft, non-tender, non-distended MUSCULOSKELETAL:  No edema; No deformity  SKIN: Warm and dry NEUROLOGIC:  Alert and oriented x 3 PSYCHIATRIC:  Normal affect    ASSESSMENT:    1. Persistent atrial fibrillation (HCC)   2. OSA (obstructive sleep apnea)   3. Morbid obesity (HCC)    PLAN:    In order of problems listed above:  1. Atrial fibrillation: -This was found during his recent hospitalization and started on Amio 200mg  BID for rate control.  -s/p DCCV to NSR 01/04/20 -now followed in afib clinic and maintaining NSR -they are recommending remaining on Amio for now with plans in the future to transition to a different AAD given his young age -continue apixaban  5mg  BID -now on Amio 100mg  daily -continue low dose BB -continue followup in afib clinic  2.  OSA - The pathophysiology of obstructive sleep apnea , it's  cardiovascular consequences & modes of treatment including CPAP were discused with the patient in detail & they evidenced understanding.  The patient is tolerating PAP therapy well without any problems. The PAP download was reviewed today and showed an AHI of 4.7/hr on auto PAP with 70% compliance in using more than 4 hours nightly.  The patient has been using and benefiting from PAP use and will continue to benefit from therapy.  -he used to use a sleep aide for years but currently not using and has problems maintaining sleep -he has tried meditation through the years and still has problems.   -I am leery to give him a sleep aide to use nightly due to that he is now in his 90's with CHF and other medical problems -I have asked him to talk with his PCP about the insomnia>>would rather he tried behavioral therapy as his main problem is laying in bed worrying about things  3.  Morbid Obesity -I have encouraged him to get into a routine exercise program and cut back on carbs and portions.  -I will refer him to healthy weight and wellness     Medication Adjustments/Labs and Tests Ordered: Current medicines are reviewed at length with the patient today.  Concerns regarding medicines are outlined above.  No orders of the defined types were placed in this encounter.  No orders of the defined types were placed in this encounter.   Signed, , MD  07/24/2020 2:29 PM    [facility]

## 2020-07-24 NOTE — Addendum Note (Signed)
Addended by: Theresia Majors on: 07/24/2020 02:39 PM   Modules accepted: Orders

## 2020-07-24 NOTE — Progress Notes (Signed)
PCP: [name], DO Cardiology: Dr. Mayford Knife HF Cardiology: Dr. Shirlee Latch  64 y.o. with history of CAD, PAF, and ischemic cardiomyopathy was referred by Dr. Mayford Knife for evaluation of CHF.  Patent was admitted to [facility] in 3/21 with CHF and atrial fibrillation/RVR. Echo showed EF 20-25%.  LHC was done, showing occluded mid LAD with collaterals.  Cardiac MRI showed EF 22%, apical aneurysm, no significant LAD territory viability; therefore, patient did not have CABG or PCI. Patient was additionally found to have right MCA aneurysm.  This was treated with embolization in 4/21.  In 5/21, he had DCCV back to NSR.  He has remained in NSR on amiodarone.  Repeat echo in 6/21 showed EF 20-25% with moderately decreased RV systolic function.  St Jude ICD was placed.  Patient has also been diagnosed with OSA and has started CPAP.   CPX 11/21 with no clear HF limitation.   Patient returns for followup of CHF.  Main complaint is bilateral knee pain from OA.  He has been doing cardiac rehab.  Mild dyspnea only with a long walk. He does fatigue by the end of the day.  No chest pain.  Able to do all his ADLs.  Weight stable.   ECG (personally reviewed): NSR, RBBB, LAFB, old ASMI  Labs (9/21): K 4.9, creatinine 0.93 Labs (10/21): LDL 68, HDL 49, LFTs normal, TSH normal Labs (11/21): K 4.2, creatinine 0.95  PMH: 1. Cerebral aneurysm: Right MCA aneurysm, embolized in 4/21 by IR.  2. Atrial fibrillation: Paroxysmal.  - DCCV 5/21 3. CAD: LHC (3/21) with totally occluded mid LAD with collaterals.  4. Chronic systolic CHF: Ischemic cardiomyopathy.   - Cardiac MRI (5/21): EF 22%, apical aneurysm, LAD territory scarring without evidence for significant viability. RV EF 31%.  - Echo (6/21): EF 20-25%, moderately decreased RV systolic function.  - St Jude ICD.  - CPX (11/21): peak VO2 17.9 (81% predicted), VE/VCO2 slope 25, RER 1.17 => no clear HF limitation.  5. Gout 6. OSA: Using CPAP.   Social History    Socioeconomic History  . Marital status: Married    Spouse name: Not on file  . Number of children: Not on file  . Years of education: Not on file  . Highest education level: Some college, no degree  Occupational History  . Not on file  Tobacco Use  . Smoking status: Former Smoker    Packs/day: 0.25    Years: 3.00    Pack years: 0.75    Types: Cigarettes    Quit date: 08/17/2011    Years since quitting: 8.9  . Smokeless tobacco: Never Used  Vaping Use  . Vaping Use: Never used  Substance and Sexual Activity  . Alcohol use: Yes    Alcohol/week: 1.0 standard drink    Types: 1 Glasses of wine per week    Comment: occasional wine  . Drug use: No  . Sexual activity: Yes  Other Topics Concern  . Not on file  Social History Narrative  . Not on file   Social Determinants of Health   Financial Resource Strain: Not on file  Food Insecurity: Not on file  Transportation Needs: Not on file  Physical Activity: Not on file  Stress: Not on file  Social Connections: Not on file  Intimate Partner Violence: Not on file   FH: No premature CAD or CHF.   ROS: All systems reviewed and negative except as per HPI.   Current Outpatient Medications  Medication Sig Dispense Refill  .  amiodarone (PACERONE) 200 MG tablet Take 0.5 tablets (100 mg total) by mouth daily. 15 tablet 11  . aspirin EC 81 MG tablet Take 81 mg by mouth daily.    . colchicine 0.6 MG tablet Take 0.6 mg by mouth 2 (two) times daily as needed (for gout flares).     . diclofenac sodium (VOLTAREN) 1 % GEL Apply 2 g topically 2 (two) times daily as needed (to affected sites- for arthritis pain).     Marland Kitchen ELIQUIS 5 MG TABS tablet TAKE 1 TABLET(5 MG) BY MOUTH TWICE DAILY (Patient taking differently: Take 5 mg by mouth 2 (two) times daily. ) 60 tablet 8  . furosemide (LASIX) 40 MG tablet Take 1 tablet (40 mg total) by mouth in the morning AND 0.5 tablets (20 mg total) every evening. 45 tablet 3  . metoprolol succinate (TOPROL XL)  50 MG 24 hr tablet Take 1 tablet (50 mg total) by mouth daily. 30 tablet 5  . oxyCODONE-acetaminophen (PERCOCET/ROXICET) 5-325 MG tablet Take 1 tablet by mouth in the morning, at noon, and at bedtime.    . rosuvastatin (CRESTOR) 5 MG tablet Take 1 tablet (5 mg total) by mouth daily. 90 tablet 3  . sacubitril-valsartan (ENTRESTO) 49-51 MG Take 1 tablet by mouth 2 (two) times daily. 60 tablet 3  . spironolactone (ALDACTONE) 25 MG tablet Take 1 tablet (25 mg total) by mouth daily. 90 tablet 3  . dapagliflozin propanediol (FARXIGA) 10 MG TABS tablet Take 1 tablet (10 mg total) by mouth daily before breakfast. 30 tablet 5   No current facility-administered medications for this encounter.   BP 120/80   Pulse 84   Wt 115.1 kg (253 lb 12.8 oz)   SpO2 98%   BMI 35.65 kg/m  General: NAD Neck: No JVD, no thyromegaly or thyroid nodule.  Lungs: Clear to auscultation bilaterally with normal respiratory effort. CV: Nondisplaced PMI.  Heart regular S1/S2, no S3/S4, no murmur.  No peripheral edema.  No carotid bruit.  Normal pedal pulses.  Abdomen: Soft, nontender, no hepatosplenomegaly, no distention.  Skin: Intact without lesions or rashes.  Neurologic: Alert and oriented x 3.  Psych: Normal affect. Extremities: No clubbing or cyanosis.  HEENT: Normal.   Assessment/Plan: 1. CAD: LHC in 3/21 with occluded mid LAD with collaterals.  Cardiac MRI was not suggestive of LAD territory viability, there was an apical aneurysm but no thrombus.  Patient was managed medically. No chest pain.  - He is on Eliquis for atrial fibrillation and also ASA 81 for cerebral aneurysm embolization.  - Continue Crestor, good lipids in 10/21.  2. Atrial fibrillation: Paroxysmal, s/p DCCV to NSR in 5/21. He has been maintained in NSR on amiodarone.  - Continue amiodarone 100 mg daily.  Recent LFTs and TSH normal.  Will need regular eye exam.   - Continue Eliquis.  - He discussed atrial fibrillation ablation with Dr. Ladona Ridgel,  who recommended weight loss before considering.  3. Chronic systolic CHF: Ischemic cardiomyopathy.  Echo in 6/21 with EF 20-25%, moderately decreased RV systolic function.  St Jude ICD, not CRT candidate.  CPX in 11/21 with minimal HF limitation. He is not volume overloaded on exam.  NYHA class II symptoms.  - Continue Entresto 49/51 bid.  - Continue Lasix 40 qam/20 qpm.  - Continue spironolactone 25 mg daily.  - Add Farxiga 10 mg daily.  BMET today and in 10 days.  - Increase Toprol XL to 50 mg daily.   4. Right MCA aneurysm:  S/p embolization.  - Following with IR, still on ASA 81 in addition to Eliquis.  Stop ASA when ok with neuro IR.  5. OSA: Using CPAP.   Followup in 4 wks with HF pharmacist for med titration.  See me in 2 months.    Marca Ancona 07/24/2020

## 2020-07-25 ENCOUNTER — Other Ambulatory Visit: Payer: Self-pay

## 2020-07-25 ENCOUNTER — Encounter (HOSPITAL_COMMUNITY)
Admission: RE | Admit: 2020-07-25 | Discharge: 2020-07-25 | Disposition: A | Discharge status: Home / Self Care | Payer: Commercial Managed Care - PPO | Source: Ambulatory Visit | Attending: Cardiology | Admitting: Cardiology

## 2020-07-25 ENCOUNTER — Telehealth: Payer: Self-pay | Admitting: Cardiology

## 2020-07-25 DIAGNOSIS — I5022 Chronic systolic (congestive) heart failure: Secondary | ICD-10-CM | POA: Diagnosis not present

## 2020-07-28 ENCOUNTER — Other Ambulatory Visit: Payer: Self-pay

## 2020-07-28 ENCOUNTER — Encounter (HOSPITAL_COMMUNITY)
Admission: RE | Admit: 2020-07-28 | Discharge: 2020-07-28 | Disposition: A | Discharge status: Home / Self Care | Payer: Commercial Managed Care - PPO | Source: Ambulatory Visit | Attending: Cardiology | Admitting: Cardiology

## 2020-07-28 DIAGNOSIS — I5022 Chronic systolic (congestive) heart failure: Secondary | ICD-10-CM | POA: Diagnosis not present

## 2020-07-29 ENCOUNTER — Ambulatory Visit (HOSPITAL_COMMUNITY)
Admission: RE | Admit: 2020-07-29 | Discharge: 2020-07-29 | Disposition: A | Discharge status: Home / Self Care | Payer: Commercial Managed Care - PPO | Source: Ambulatory Visit | Attending: Cardiology | Admitting: Cardiology

## 2020-07-29 DIAGNOSIS — I5022 Chronic systolic (congestive) heart failure: Secondary | ICD-10-CM | POA: Diagnosis not present

## 2020-07-29 LAB — BASIC METABOLIC PANEL
Anion gap: 12 (ref 5–15)
BUN: 32 mg/dL — ABNORMAL HIGH (ref 8–23)
CO2: 24 mmol/L (ref 22–32)
Calcium: 9.3 mg/dL (ref 8.9–10.3)
Chloride: 98 mmol/L (ref 98–111)
Creatinine, Ser: 1.15 mg/dL (ref 0.61–1.24)
GFR, Estimated: >60 mL/min (ref >60)
Glucose, Bld: 121 mg/dL — ABNORMAL HIGH (ref 70–99)
Potassium: 4.3 mmol/L (ref 3.5–5.1)
Sodium: 134 mmol/L — ABNORMAL LOW (ref 135–145)

## 2020-07-29 NOTE — Progress Notes (Signed)
Cardiac Individual Treatment Plan  Patient Details  Name: Richard Graham MRN: [id number] Date of Birth: [birth year] Referring Provider:   Flowsheet Row CARDIAC REHAB PHASE II ORIENTATION from 07/03/2020 in [place]  Referring Provider Larey Dresser, MD      Initial Encounter Date:  [place] PHASE II ORIENTATION from 07/03/2020 in [place]  Date 07/03/20      Visit Diagnosis: Heart failure, chronic systolic ([place])  Patient's Home Medications on Admission:  Current Outpatient Medications:  .  amiodarone (PACERONE) 200 MG tablet, Take 0.5 tablets (100 mg total) by mouth daily., Disp: 15 tablet, Rfl: 11 .  aspirin EC 81 MG tablet, Take 81 mg by mouth daily., Disp: , Rfl:  .  colchicine 0.6 MG tablet, Take 0.6 mg by mouth 2 (two) times daily as needed (for gout flares). , Disp: , Rfl:  .  dapagliflozin propanediol (FARXIGA) 10 MG TABS tablet, Take 1 tablet (10 mg total) by mouth daily before breakfast., Disp: 30 tablet, Rfl: 5 .  diclofenac sodium (VOLTAREN) 1 % GEL, Apply 2 g topically 2 (two) times daily as needed (to affected sites- for arthritis pain). , Disp: , Rfl:  .  ELIQUIS 5 MG TABS tablet, TAKE 1 TABLET(5 MG) BY MOUTH TWICE DAILY (Patient taking differently: Take 5 mg by mouth 2 (two) times daily.), Disp: 60 tablet, Rfl: 8 .  furosemide (LASIX) 40 MG tablet, Take 1 tablet (40 mg total) by mouth in the morning AND 0.5 tablets (20 mg total) every evening., Disp: 45 tablet, Rfl: 3 .  metoprolol succinate (TOPROL XL) 50 MG 24 hr tablet, Take 1 tablet (50 mg total) by mouth daily., Disp: 30 tablet, Rfl: 5 .  oxyCODONE-acetaminophen (PERCOCET/ROXICET) 5-325 MG tablet, Take 1 tablet by mouth in the morning, at noon, and at bedtime., Disp: , Rfl:  .  rosuvastatin (CRESTOR) 5 MG tablet, Take 1 tablet (5 mg total) by mouth daily., Disp: 90 tablet, Rfl: 3 .  sacubitril-valsartan (ENTRESTO) 49-51 MG,  Take 1 tablet by mouth 2 (two) times daily., Disp: 60 tablet, Rfl: 3 .  spironolactone (ALDACTONE) 25 MG tablet, Take 1 tablet (25 mg total) by mouth daily., Disp: 90 tablet, Rfl: 3  Past Medical History: Past Medical History:  Diagnosis Date  . CAD (coronary artery disease), native coronary artery    chronically occluded mid LAD with left to left collaterals.   . Cerebral aneurysm    s/p embolization  . Chronic systolic (congestive) heart failure (HCC)    biventricular HF with moderate RV dysfunction and severe LV dysfunction by Cardiac MRI with EF 22% no viability in the apical septal/apical/inferior,mid anterior and true apex.    . Gout   . HLD (hyperlipidemia)   . PAF (paroxysmal atrial fibrillation) ([place])    s/p DCCV 12/2019  . Pre-diabetes    diet controlled, no meds    Tobacco Use: Social History   Tobacco Use  Smoking Status Former Smoker  . Packs/day: 0.25  . Years: 3.00  . Pack years: 0.75  . Types: Cigarettes  . Quit date: 08/17/2011  . Years since quitting: 8.9  Smokeless Tobacco Never Used    Labs: Recent Review Flowsheet Data    Labs for ITP Cardiac and Pulmonary Rehab Latest Ref Rng & Units 11/06/2019 11/07/2019 11/12/2019 11/12/2019 06/10/2020   Cholestrol 0 - 200 mg/dL - 103 - - 137   LDLCALC 0 - 99 mg/dL - 56 - -  68   HDL >40 mg/dL - 40(L) - - 49   Trlycerides <150 mg/dL - 36 - - 99   Hemoglobin A1c 4.8 - 5.6 % - - - - -   PHART 7.350 - 7.450 7.364 - 7.452(H) - -   PCO2ART 32.0 - 48.0 mmHg 81.3(HH) - 51.1(H) - -   HCO3 20.0 - 28.0 mmol/L 44.6(H) - 35.7(H) 37.6(H) -   TCO2 22 - 32 mmol/L - - 37(H) 39(H) -   O2SAT % 93.9 - 97.0 63.0 -      Capillary Blood Glucose: Lab Results  Component Value Date   GLUCAP 152 (H) 11/12/2019   GLUCAP 123 (H) 11/11/2019   GLUCAP 155 (H) 11/11/2019   GLUCAP 127 (H) 11/06/2019     Exercise Target Goals: Exercise Program Goal: Individual exercise prescription set using results from initial 6 min walk test and THRR  while considering  patient's activity barriers and safety.   Exercise Prescription Goal: Initial exercise prescription builds to 30-45 minutes a day of aerobic activity, 2-3 days per week.  Home exercise guidelines will be given to patient during program as part of exercise prescription that the participant will acknowledge.  Activity Barriers & Risk Stratification:  Activity Barriers & Cardiac Risk Stratification - 07/03/20 1353      Activity Barriers & Cardiac Risk Stratification   Activity Barriers Arthritis;Joint Problems;Deconditioning;Shortness of Breath;Decreased Ventricular Function;Balance Concerns    Cardiac Risk Stratification High           6 Minute Walk:  6 Minute Walk    Row Name 07/03/20 1223         6 Minute Walk   Phase Initial     Distance 832 feet  Half way pateint given wheelchair to push due to knee pain     Walk Time 6 minutes     # of Rest Breaks 0     MPH 1.57     METS 2.17     RPE 13     Perceived Dyspnea  1     VO2 Peak 7.61     Symptoms Yes (comment)     Comments SOB, RPD =1; Bilateral knee pain (8 out of 10)     Resting HR 87 bpm     Resting BP 99/59     Resting Oxygen Saturation  97 %     Exercise Oxygen Saturation  during 6 min walk 97 %     Max Ex. HR 113 bpm     Max Ex. BP 135/80     2 Minute Post BP 118/82            Oxygen Initial Assessment:   Oxygen Re-Evaluation:   Oxygen Discharge (Final Oxygen Re-Evaluation):   Initial Exercise Prescription:  Initial Exercise Prescription - 07/03/20 1300      Date of Initial Exercise RX and Referring Provider   Date 07/03/20    Referring Provider Larey Dresser, MD    Expected Discharge Date 08/29/20      NuStep   Level 1    SPM 85    Minutes 25    METs 1.7      Prescription Details   Frequency (times per week) 3    Duration Progress to 30 minutes of continuous aerobic without signs/symptoms of physical distress      Intensity   THRR 40-80% of Max Heartrate 63-126     Ratings of Perceived Exertion 11-13    Perceived Dyspnea 0-4  Progression   Progression Continue progressive overload as per policy without signs/symptoms or physical distress.      Resistance Training   Training Prescription Yes    Weight 3 lbs    Reps 10-15           Perform Capillary Blood Glucose checks as needed.  Exercise Prescription Changes:  Exercise Prescription Changes    Row Name 07/09/20 1600 07/25/20 1430           Response to Exercise   Blood Pressure (Admit) 108/60 102/60      Blood Pressure (Exercise) 124/78 116/70      Blood Pressure (Exit) 112/74 97/62      Heart Rate (Admit) 73 bpm 99 bpm      Heart Rate (Exercise) 99 bpm 110 bpm      Heart Rate (Exit) 78 bpm 88 bpm      Rating of Perceived Exertion (Exercise) 11 11      Symptoms None None      Comments Pt's first day of exerrcise in the CRP2 program Reviewed METs      Duration Progress to 30 minutes of  aerobic without signs/symptoms of physical distress Continue with 30 min of aerobic exercise without signs/symptoms of physical distress.      Intensity THRR unchanged THRR unchanged             Progression   Progression Continue to progress workloads to maintain intensity without signs/symptoms of physical distress. Continue to progress workloads to maintain intensity without signs/symptoms of physical distress.      Average METs 2 2.7             Resistance Training   Training Prescription Yes Yes      Weight --  No weights done on Wednesdays 3 lbs      Reps -- 10-15      Time -- 10 Minutes             Interval Training   Interval Training No No             NuStep   Level 1 4      SPM 85 85      Minutes 30 30      METs 2 2.7             Exercise Comments:  Exercise Comments    Row Name 07/09/20 1619 07/25/20 1433         Exercise Comments Pt's first day of exercise in the CRP2 program. Pt tolerated the session well. Reviewed METS. Pt is making progress in the CRP2  program. Pt demonstrates good attendence and is motivated to improve his CV fitness level. Will contiue to work with patient to progress MET level with exercise.             Exercise Goals and Review:  Exercise Goals    Row Name 07/03/20 1354             Exercise Goals   Increase Physical Activity Yes       Intervention Provide advice, education, support and counseling about physical activity/exercise needs.;Develop an individualized exercise prescription for aerobic and resistive training based on initial evaluation findings, risk stratification, comorbidities and participant's personal goals.       Expected Outcomes Short Term: Attend rehab on a regular basis to increase amount of physical activity.;Long Term: Add in home exercise to make exercise part of routine and to increase amount of physical activity.;Long Term: Exercising  regularly at least 3-5 days a week.       Increase Strength and Stamina Yes       Intervention Provide advice, education, support and counseling about physical activity/exercise needs.;Develop an individualized exercise prescription for aerobic and resistive training based on initial evaluation findings, risk stratification, comorbidities and participant's personal goals.       Expected Outcomes Short Term: Perform resistance training exercises routinely during rehab and add in resistance training at home;Short Term: Increase workloads from initial exercise prescription for resistance, speed, and METs.;Long Term: Improve cardiorespiratory fitness, muscular endurance and strength as measured by increased METs and functional capacity (6MWT)       Able to understand and use rate of perceived exertion (RPE) scale Yes       Intervention Provide education and explanation on how to use RPE scale       Expected Outcomes Short Term: Able to use RPE daily in rehab to express subjective intensity level;Long Term:  Able to use RPE to guide intensity level when exercising  independently       Knowledge and understanding of Target Heart Rate Range (THRR) Yes       Intervention Provide education and explanation of THRR including how the numbers were predicted and where they are located for reference       Expected Outcomes Short Term: Able to state/look up THRR;Short Term: Able to use daily as guideline for intensity in rehab;Long Term: Able to use THRR to govern intensity when exercising independently       Understanding of Exercise Prescription Yes       Intervention Provide education, explanation, and written materials on patient's individual exercise prescription       Expected Outcomes Short Term: Able to explain program exercise prescription;Long Term: Able to explain home exercise prescription to exercise independently              Exercise Goals Re-Evaluation :  Exercise Goals Re-Evaluation    Row Name 07/09/20 1616             Exercise Goal Re-Evaluation   Exercise Goals Review Increase Physical Activity;Increase Strength and Stamina;Able to understand and use rate of perceived exertion (RPE) scale;Knowledge and understanding of Target Heart Rate Range (THRR);Understanding of Exercise Prescription       Comments Pt's first day of exercise in the CRP2 program. Pt tolerated the exercise session well and had no complaints. Pt understnads the THRR, RPE scale and Exercise Rx.       Expected Outcomes Will continue to monitor and progress pat as tolerated.              Discharge Exercise Prescription (Final Exercise Prescription Changes):  Exercise Prescription Changes - 07/25/20 1430      Response to Exercise   Blood Pressure (Admit) 102/60    Blood Pressure (Exercise) 116/70    Blood Pressure (Exit) 97/62    Heart Rate (Admit) 99 bpm    Heart Rate (Exercise) 110 bpm    Heart Rate (Exit) 88 bpm    Rating of Perceived Exertion (Exercise) 11    Symptoms None    Comments Reviewed METs    Duration Continue with 30 min of aerobic exercise without  signs/symptoms of physical distress.    Intensity THRR unchanged      Progression   Progression Continue to progress workloads to maintain intensity without signs/symptoms of physical distress.    Average METs 2.7      Resistance Training   Training  Prescription Yes    Weight 3 lbs    Reps 10-15    Time 10 Minutes      Interval Training   Interval Training No      NuStep   Level 4    SPM 85    Minutes 30    METs 2.7           Nutrition:  Target Goals: Understanding of nutrition guidelines, daily intake of sodium <1588m, cholesterol <2084m calories 30% from fat and 7% or less from saturated fats, daily to have 5 or more servings of fruits and vegetables.  Biometrics:  Pre Biometrics - 07/03/20 1100      Pre Biometrics   Waist Circumference 48 inches    Hip Circumference 47 inches    Waist to Hip Ratio 1.02 %    Triceps Skinfold 37 mm    % Body Fat 37.7 %    Grip Strength 45 kg    Flexibility 11.25 in    Single Leg Stand 2.02 seconds   High risk for Fall           Nutrition Therapy Plan and Nutrition Goals:  Nutrition Therapy & Goals - 07/21/20 1034      Nutrition Therapy   Diet Low sodium, TLC      Personal Nutrition Goals   Nutrition Goal Pt to build a healthy plate including vegetables, fruits, whole grains, and low-fat dairy products in a heart healthy meal plan.    Personal Goal #2 Pt to keep daily weight and BP log    Personal Goal #3 Pt able to name foods that affect blood glucose    Personal Goal #4 Pt to reduce sodium intake <2000 mg/day      Intervention Plan   Intervention Prescribe, educate and counsel regarding individualized specific dietary modifications aiming towards targeted core components such as weight, hypertension, lipid management, diabetes, heart failure and other comorbidities.;Nutrition handout(s) given to patient.    Expected Outcomes Long Term Goal: Adherence to prescribed nutrition plan.;Short Term Goal: A plan has been  developed with personal nutrition goals set during dietitian appointment.           Nutrition Assessments:  Nutrition Assessments - 07/18/20 1521      MEDFICTS Scores   Pre Score 83          MEDIFICTS Score Key:  ?70 Need to make dietary changes   40-70 Heart Healthy Diet  ? 40 Therapeutic Level Cholesterol Diet    Picture Your Plate Scores:  <4<16nhealthy dietary pattern with much room for improvement.  41-50 Dietary pattern unlikely to meet recommendations for good health and room for improvement.  51-60 More healthful dietary pattern, with some room for improvement.   >60 Healthy dietary pattern, although there may be some specific behaviors that could be improved.    Nutrition Goals Re-Evaluation:  Nutrition Goals Re-Evaluation    [place] 07/21/20 1036             Goals   Current Weight 257 lb (116.6 kg)       Nutrition Goal Pt to build a healthy plate including vegetables, fruits, whole grains, and low-fat dairy products in a heart healthy meal plan.               Personal Goal #2 Re-Evaluation   Personal Goal #2 Pt to keep daily weight and BP log               Personal Goal #3  Re-Evaluation   Personal Goal #3 Pt able to name foods that affect blood glucose               Personal Goal #4 Re-Evaluation   Personal Goal #4 Pt to reduce sodium intake <2000 mg/day              Nutrition Goals Re-Evaluation:  Nutrition Goals Re-Evaluation    [place] Name 07/21/20 1036             Goals   Current Weight 257 lb (116.6 kg)       Nutrition Goal Pt to build a healthy plate including vegetables, fruits, whole grains, and low-fat dairy products in a heart healthy meal plan.               Personal Goal #2 Re-Evaluation   Personal Goal #2 Pt to keep daily weight and BP log               Personal Goal #3 Re-Evaluation   Personal Goal #3 Pt able to name foods that affect blood glucose               Personal Goal #4 Re-Evaluation   Personal Goal #4  Pt to reduce sodium intake <2000 mg/day              Nutrition Goals Discharge (Final Nutrition Goals Re-Evaluation):  Nutrition Goals Re-Evaluation - 07/21/20 1036      Goals   Current Weight 257 lb (116.6 kg)    Nutrition Goal Pt to build a healthy plate including vegetables, fruits, whole grains, and low-fat dairy products in a heart healthy meal plan.      Personal Goal #2 Re-Evaluation   Personal Goal #2 Pt to keep daily weight and BP log      Personal Goal #3 Re-Evaluation   Personal Goal #3 Pt able to name foods that affect blood glucose      Personal Goal #4 Re-Evaluation   Personal Goal #4 Pt to reduce sodium intake <2000 mg/day           Psychosocial: Target Goals: Acknowledge presence or absence of significant depression and/or stress, maximize coping skills, provide positive support system. Participant is able to verbalize types and ability to use techniques and skills needed for reducing stress and depression.  Initial Review & Psychosocial Screening:  Initial Psych Review & Screening - 07/04/20 0807      Initial Review   Current issues with Current Stress Concerns    Source of Stress Concerns Financial;Unable to participate in former interests or hobbies;Unable to perform yard/household activities;Chronic Illness;Retirement/disability    Comments Glen's job recently closed he is concerned about his health insurance and finances      [place]? Yes      Barriers   Psychosocial barriers to participate in program The patient should benefit from training in stress management and relaxation.      Screening Interventions   Interventions Encouraged to exercise;Provide feedback about the scores to participant;To provide support and resources with identified psychosocial needs    Expected Outcomes Short Term goal: Utilizing psychosocial counselor, staff and physician to assist with identification of specific Stressors or current issues  interfering with healing process. Setting desired goal for each stressor or current issue identified.;Long Term Goal: Stressors or current issues are controlled or eliminated.;Short Term goal: Identification and review with participant of any Quality of Life or Depression concerns found by scoring the questionnaire.  Quality of Life Scores:  Quality of Life - 07/03/20 1346      Quality of Life   Select Quality of Life      Quality of Life Scores   Health/Function Pre 18.6 %    Socioeconomic Pre 22.94 %    Psych/Spiritual Pre 22.33 %    Family Pre 22.5 %    GLOBAL Pre 20.8 %          Scores of 19 and below usually indicate a poorer quality of life in these areas.  A difference of  2-3 points is a clinically meaningful difference.  A difference of 2-3 points in the total score of the Quality of Life Index has been associated with significant improvement in overall quality of life, self-image, physical symptoms, and general health in studies assessing change in quality of life.  PHQ-9: Recent Review Flowsheet Data    Depression screen [facility] 2/9 07/04/2020   Decreased Interest 0   Down, Depressed, Hopeless 0   PHQ - 2 Score 0     Interpretation of Total Score  Total Score Depression Severity:  1-4 = Minimal depression, 5-9 = Mild depression, 10-14 = Moderate depression, 15-19 = Moderately severe depression, 20-27 = Severe depression   Psychosocial Evaluation and Intervention:  Psychosocial Evaluation - 07/29/20 0733      Psychosocial Evaluation & Interventions   Expected Outcomes Pa           Psychosocial Re-Evaluation:  Psychosocial Re-Evaluation    Row Name 07/29/20 5732             Psychosocial Re-Evaluation   Current issues with Current Stress Concerns       Comments Mr. Yeagle admists to stress secondary to his health. He is no longer working and concerned about his finances and health insurance. He is currently Cobra status. He admits to having a  good support system. Enjoys reading, walking, fishing, and participating in [place] activities. Feels as his health and functional capacity improves so will his stress. No psychosocial barriers to participation in cardiac rehab identified. No interventions needed. Will continue to closely monitor.       Expected Outcomes tient will continue to have a positive attitude and outlook. He will continue to participate in CR. He will utilize hobbies to manage stress in a healthy way.       Interventions Encouraged to attend Cardiac Rehabilitation for the exercise;Stress management education       Continue Psychosocial Services  Follow up required by staff              Psychosocial Discharge (Final Psychosocial Re-Evaluation):  Psychosocial Re-Evaluation - 07/29/20 0733      Psychosocial Re-Evaluation   Current issues with Current Stress Concerns    Comments Mr. Nuno admists to stress secondary to his health. He is no longer working and concerned about his finances and health insurance. He is currently Cobra status. He admits to having a good support system. Enjoys reading, walking, fishing, and participating in [place] activities. Feels as his health and functional capacity improves so will his stress. No psychosocial barriers to participation in cardiac rehab identified. No interventions needed. Will continue to closely monitor.    Expected Outcomes tient will continue to have a positive attitude and outlook. He will continue to participate in CR. He will utilize hobbies to manage stress in a healthy way.    Interventions Encouraged to attend Cardiac Rehabilitation for the exercise;Stress management education    Continue Psychosocial Services  Follow up required by staff           Vocational Rehabilitation: Provide vocational rehab assistance to qualifying candidates.   Vocational Rehab Evaluation & Intervention:  Vocational Rehab - 07/04/20 0837      Initial Vocational Rehab Evaluation &  Intervention   Assessment shows need for Vocational Rehabilitation No           Education: Education Goals: Education classes will be provided on a weekly basis, covering required topics. Participant will state understanding/return demonstration of topics presented.  Learning Barriers/Preferences:  Learning Barriers/Preferences - 07/03/20 1347      Learning Barriers/Preferences   Learning Barriers Sight   Wears reading glasses   Learning Preferences Audio;Computer/Internet;Group Instruction;Individual Instruction;Pictoral;Written Material;Video;Skilled Demonstration;Verbal Instruction           Education Topics: Count Your Pulse:  -Group instruction provided by verbal instruction, demonstration, patient participation and written materials to support subject.  Instructors address importance of being able to find your pulse and how to count your pulse when at home without a heart monitor.  Patients get hands on experience counting their pulse with staff help and individually.   Heart Attack, Angina, and Risk Factor Modification:  -Group instruction provided by verbal instruction, video, and written materials to support subject.  Instructors address signs and symptoms of angina and heart attacks.    Also discuss risk factors for heart disease and how to make changes to improve heart health risk factors.   Functional Fitness:  -Group instruction provided by verbal instruction, demonstration, patient participation, and written materials to support subject.  Instructors address safety measures for doing things around the house.  Discuss how to get up and down off the floor, how to pick things up properly, how to safely get out of a chair without assistance, and balance training.   Meditation and Mindfulness:  -Group instruction provided by verbal instruction, patient participation, and written materials to support subject.  Instructor addresses importance of mindfulness and meditation  practice to help reduce stress and improve awareness.  Instructor also leads participants through a meditation exercise.    Stretching for Flexibility and Mobility:  -Group instruction provided by verbal instruction, patient participation, and written materials to support subject.  Instructors lead participants through series of stretches that are designed to increase flexibility thus improving mobility.  These stretches are additional exercise for major muscle groups that are typically performed during regular warm up and cool down.   Hands Only CPR:  -Group verbal, video, and participation provides a basic overview of AHA guidelines for community CPR. Role-play of emergencies allow participants the opportunity to practice calling for help and chest compression technique with discussion of AED use.   Hypertension: -Group verbal and written instruction that provides a basic overview of hypertension including the most recent diagnostic guidelines, risk factor reduction with self-care instructions and medication management.    Nutrition I class: Heart Healthy Eating:  -Group instruction provided by PowerPoint slides, verbal discussion, and written materials to support subject matter. The instructor gives an explanation and review of the Therapeutic Lifestyle Changes diet recommendations, which includes a discussion on lipid goals, dietary fat, sodium, fiber, plant stanol/sterol esters, sugar, and the components of a well-balanced, healthy diet.   Nutrition II class: Lifestyle Skills:  -Group instruction provided by PowerPoint slides, verbal discussion, and written materials to support subject matter. The instructor gives an explanation and review of label reading, grocery shopping for heart health, heart healthy recipe modifications, and ways to make healthier  choices when eating out.   Diabetes Question & Answer:  -Group instruction provided by PowerPoint slides, verbal discussion, and  written materials to support subject matter. The instructor gives an explanation and review of diabetes co-morbidities, pre- and post-prandial blood glucose goals, pre-exercise blood glucose goals, signs, symptoms, and treatment of hypoglycemia and hyperglycemia, and foot care basics.   Diabetes Blitz:  -Group instruction provided by PowerPoint slides, verbal discussion, and written materials to support subject matter. The instructor gives an explanation and review of the physiology behind type 1 and type 2 diabetes, diabetes medications and rational behind using different medications, pre- and post-prandial blood glucose recommendations and Hemoglobin A1c goals, diabetes diet, and exercise including blood glucose guidelines for exercising safely.    Portion Distortion:  -Group instruction provided by PowerPoint slides, verbal discussion, written materials, and food models to support subject matter. The instructor gives an explanation of serving size versus portion size, changes in portions sizes over the last 20 years, and what consists of a serving from each food group.   Stress Management:  -Group instruction provided by verbal instruction, video, and written materials to support subject matter.  Instructors review role of stress in heart disease and how to cope with stress positively.     Exercising on Your Own:  -Group instruction provided by verbal instruction, power point, and written materials to support subject.  Instructors discuss benefits of exercise, components of exercise, frequency and intensity of exercise, and end points for exercise.  Also discuss use of nitroglycerin and activating EMS.  Review options of places to exercise outside of rehab.  Review guidelines for sex with heart disease.   Cardiac Drugs I:  -Group instruction provided by verbal instruction and written materials to support subject.  Instructor reviews cardiac drug classes: antiplatelets, anticoagulants, beta  blockers, and statins.  Instructor discusses reasons, side effects, and lifestyle considerations for each drug class.   Cardiac Drugs II:  -Group instruction provided by verbal instruction and written materials to support subject.  Instructor reviews cardiac drug classes: angiotensin converting enzyme inhibitors (ACE-I), angiotensin II receptor blockers (ARBs), nitrates, and calcium channel blockers.  Instructor discusses reasons, side effects, and lifestyle considerations for each drug class.   Anatomy and Physiology of the Circulatory System:  Group verbal and written instruction and models provide basic cardiac anatomy and physiology, with the coronary electrical and arterial systems. Review of: AMI, Angina, Valve disease, Heart Failure, Peripheral Artery Disease, Cardiac Arrhythmia, Pacemakers, and the ICD.   Other Education:  -Group or individual verbal, written, or video instructions that support the educational goals of the cardiac rehab program.   Holiday Eating Survival Tips:  -Group instruction provided by PowerPoint slides, verbal discussion, and written materials to support subject matter. The instructor gives patients tips, tricks, and techniques to help them not only survive but enjoy the holidays despite the onslaught of food that accompanies the holidays.   Knowledge Questionnaire Score:  Knowledge Questionnaire Score - 07/03/20 1348      Knowledge Questionnaire Score   Pre Score 21/24           Core Components/Risk Factors/Patient Goals at Admission:  Personal Goals and Risk Factors at Admission - 07/04/20 0837      Core Components/Risk Factors/Patient Goals on Admission    Weight Management Yes;Weight Loss;Obesity    Intervention Weight Management: Develop a combined nutrition and exercise program designed to reach desired caloric intake, while maintaining appropriate intake of nutrient and fiber, sodium and fats, and appropriate energy  expenditure required for the  weight goal.;Weight Management: Provide education and appropriate resources to help participant work on and attain dietary goals.;Weight Management/Obesity: Establish reasonable short term and long term weight goals.;Obesity: Provide education and appropriate resources to help participant work on and attain dietary goals.    Admit Weight 258 lb 6.1 oz (117.2 kg)    Expected Outcomes Short Term: Continue to assess and modify interventions until short term weight is achieved;Weight Maintenance: Understanding of the daily nutrition guidelines, which includes 25-35% calories from fat, 7% or less cal from saturated fats, less than 288m cholesterol, less than 1.5gm of sodium, & 5 or more servings of fruits and vegetables daily;Long Term: Adherence to nutrition and physical activity/exercise program aimed toward attainment of established weight goal;Weight Loss: Understanding of general recommendations for a balanced deficit meal plan, which promotes 1-2 lb weight loss per week and includes a negative energy balance of [phone] kcal/d;Understanding recommendations for meals to include 15-35% energy as protein, 25-35% energy from fat, 35-60% energy from carbohydrates, less than 2033mof dietary cholesterol, 20-35 gm of total fiber daily;Understanding of distribution of calorie intake throughout the day with the consumption of 4-5 meals/snacks    Diabetes Yes    Intervention Provide education about signs/symptoms and action to take for hypo/hyperglycemia.;Provide education about proper nutrition, including hydration, and aerobic/resistive exercise prescription along with prescribed medications to achieve blood glucose in normal ranges: Fasting glucose 65-99 mg/dL    Expected Outcomes Short Term: Participant verbalizes understanding of the signs/symptoms and immediate care of hyper/hypoglycemia, proper foot care and importance of medication, aerobic/resistive exercise and nutrition plan for blood glucose control.;Long  Term: Attainment of HbA1C < 7%.    Heart Failure Yes    Intervention Provide a combined exercise and nutrition program that is supplemented with education, support and counseling about heart failure. Directed toward relieving symptoms such as shortness of breath, decreased exercise tolerance, and extremity edema.    Expected Outcomes Improve functional capacity of life;Short term: Attendance in program 2-3 days a week with increased exercise capacity. Reported lower sodium intake. Reported increased fruit and vegetable intake. Reports medication compliance.;Short term: Daily weights obtained and reported for increase. Utilizing diuretic protocols set by physician.;Long term: Adoption of self-care skills and reduction of barriers for early signs and symptoms recognition and intervention leading to self-care maintenance.    Lipids Yes    Intervention Provide education and support for participant on nutrition & aerobic/resistive exercise along with prescribed medications to achieve LDL <7044mHDL >41m68m  Expected Outcomes Short Term: Participant states understanding of desired cholesterol values and is compliant with medications prescribed. Participant is following exercise prescription and nutrition guidelines.;Long Term: Cholesterol controlled with medications as prescribed, with individualized exercise RX and with personalized nutrition plan. Value goals: LDL < 70mg78mL > 40 mg.    Stress Yes    Intervention Offer individual and/or small group education and counseling on adjustment to heart disease, stress management and health-related lifestyle change. Teach and support self-help strategies.;Refer participants experiencing significant psychosocial distress to appropriate mental health specialists for further evaluation and treatment. When possible, include family members and significant others in education/counseling sessions.    Expected Outcomes Short Term: Participant demonstrates changes in  health-related behavior, relaxation and other stress management skills, ability to obtain effective social support, and compliance with psychotropic medications if prescribed.;Long Term: Emotional wellbeing is indicated by absence of clinically significant psychosocial distress or social isolation.           Core  Components/Risk Factors/Patient Goals Review:   Goals and Risk Factor Review    Row Name 07/09/20 1450 07/29/20 0735           Core Components/Risk Factors/Patient Goals Review   Personal Goals Review Weight Management/Obesity;Lipids;Diabetes;Stress;Heart Failure Weight Management/Obesity;Lipids;Diabetes;Stress;Heart Failure      Review Mr. Hibler has multiple CAD risk factors. He is eager to participate in cardiac rehab for exercise and risk factor education. He hopes to regain stamina and strength while in the program and "get back to baseling". Expect to see progression towards goals over next 30 days. Mr. Belvedere has multiple CAD risk factors. He is eager to continue to  participate in cardiac rehab for exercise and risk factor education. He is taking his medications as prescribed and following his CHF action plan. He utilizes his family and friends and faith in God to manage his stress levels. Denies current symptoms of stress including depression, anxiety, and sleeplessness. He has met with RD on several occasions for nutrition education including low NA, carb modified diet.      Expected Outcomes Patient will continue to participate in CR for risk factor modifications Patient will continue to participate in CR for risk factor modifications             Core Components/Risk Factors/Patient Goals at Discharge (Final Review):   Goals and Risk Factor Review - 07/29/20 0735      Core Components/Risk Factors/Patient Goals Review   Personal Goals Review Weight Management/Obesity;Lipids;Diabetes;Stress;Heart Failure    Review Mr. Maloof has multiple CAD risk factors. He is  eager to continue to  participate in cardiac rehab for exercise and risk factor education. He is taking his medications as prescribed and following his CHF action plan. He utilizes his family and friends and faith in God to manage his stress levels. Denies current symptoms of stress including depression, anxiety, and sleeplessness. He has met with RD on several occasions for nutrition education including low NA, carb modified diet.    Expected Outcomes Patient will continue to participate in CR for risk factor modifications           ITP Comments:  ITP Comments    Row Name 07/04/20 0734 07/09/20 1445 07/29/20 0720       ITP Comments Dr Fransico Him MD, Medical Director Mr. Reinard completed his first cardiac rehab exercise session today and tolerated very well. Worked to an RPE of 11. Mets 2.0. Nutrition consult with RD. VSS. Denied complaints. Patient goals are to increase his stamina and strength and to "get back to baseline". Mr. Harriott has completed 8 cardiac rehab exercise session since admission. He is tolerating workload increases and currently averaging 2.7 mets. VSS. States he is adhering to his CHF action plan by taking all medications and weighing daily at home. He is struggling with his HF low NA diet although his weight is consistantly decreasing since the addition of farxiga. First day of exercise weight 117.2, most recent weight 114.6. He continues to work towards a goal of increasing his stamina and strength and "getting back to baseline". Mr. Takeshita feels he is progressing towards his goals and should see more progression over the next 30 days.            Comments: see ITP and RF/Goals comments

## 2020-07-30 ENCOUNTER — Encounter (HOSPITAL_COMMUNITY)
Admission: RE | Admit: 2020-07-30 | Discharge: 2020-07-30 | Disposition: A | Discharge status: Home / Self Care | Payer: Commercial Managed Care - PPO | Source: Ambulatory Visit | Attending: Cardiology | Admitting: Cardiology

## 2020-07-30 ENCOUNTER — Other Ambulatory Visit: Payer: Self-pay

## 2020-07-30 DIAGNOSIS — I5022 Chronic systolic (congestive) heart failure: Secondary | ICD-10-CM

## 2020-08-01 ENCOUNTER — Encounter (HOSPITAL_COMMUNITY)
Admission: RE | Admit: 2020-08-01 | Discharge: 2020-08-01 | Disposition: A | Discharge status: Home / Self Care | Payer: Commercial Managed Care - PPO | Source: Ambulatory Visit | Attending: Cardiology | Admitting: Cardiology

## 2020-08-01 ENCOUNTER — Other Ambulatory Visit: Payer: Self-pay

## 2020-08-01 DIAGNOSIS — I5022 Chronic systolic (congestive) heart failure: Secondary | ICD-10-CM

## 2020-08-04 ENCOUNTER — Other Ambulatory Visit: Payer: Self-pay

## 2020-08-04 ENCOUNTER — Encounter (HOSPITAL_COMMUNITY)
Admission: RE | Admit: 2020-08-04 | Discharge: 2020-08-04 | Disposition: A | Discharge status: Home / Self Care | Payer: Commercial Managed Care - PPO | Source: Ambulatory Visit | Attending: Cardiology | Admitting: Cardiology

## 2020-08-04 DIAGNOSIS — I5022 Chronic systolic (congestive) heart failure: Secondary | ICD-10-CM | POA: Diagnosis not present

## 2020-08-04 NOTE — Progress Notes (Signed)
Reviewed home exercise Rx with patient today. Pt has completed 2 of 3 injections in his knees to reduce pain and improve his mobility. Pt voices that he is ready to do some exercise at home. The patient will walk or ride his bike. He will exercise 2 x/week for 30 minutes in addition to the CRP2 program. The importance of warm-up, cool-down, and stretching was reviewed with him. THRR of 63-126 discussed as well keeping his RPE level (11-13). Fluids encouraged before, during and after exercise. Weather parameters for temperature and humidity reviewed. Reviewed S/S that would require patient to terminate exercise and when to call 911 vs MD. Pt encouraged to carry cell phone if exercising outdoors. Pt verbalized understanding of the home exercise Rx and was provided copy.  Lorin Picket MS, ACSM-EP-C, CCRP

## 2020-08-06 ENCOUNTER — Encounter (HOSPITAL_COMMUNITY)
Admission: RE | Admit: 2020-08-06 | Discharge: 2020-08-06 | Disposition: A | Discharge status: Home / Self Care | Payer: Commercial Managed Care - PPO | Source: Ambulatory Visit | Attending: Cardiology | Admitting: Cardiology

## 2020-08-06 ENCOUNTER — Other Ambulatory Visit: Payer: Self-pay

## 2020-08-06 DIAGNOSIS — I5022 Chronic systolic (congestive) heart failure: Secondary | ICD-10-CM

## 2020-08-11 ENCOUNTER — Other Ambulatory Visit: Payer: Self-pay

## 2020-08-11 ENCOUNTER — Encounter (HOSPITAL_COMMUNITY)
Admission: RE | Admit: 2020-08-11 | Discharge: 2020-08-11 | Disposition: A | Discharge status: Home / Self Care | Payer: Commercial Managed Care - PPO | Source: Ambulatory Visit | Attending: Cardiology | Admitting: Cardiology

## 2020-08-11 DIAGNOSIS — I5022 Chronic systolic (congestive) heart failure: Secondary | ICD-10-CM

## 2020-08-13 ENCOUNTER — Other Ambulatory Visit: Payer: Self-pay

## 2020-08-13 ENCOUNTER — Encounter (HOSPITAL_COMMUNITY)
Admission: RE | Admit: 2020-08-13 | Discharge: 2020-08-13 | Disposition: A | Discharge status: Home / Self Care | Payer: Commercial Managed Care - PPO | Source: Ambulatory Visit | Attending: Cardiology | Admitting: Cardiology

## 2020-08-13 DIAGNOSIS — I5022 Chronic systolic (congestive) heart failure: Secondary | ICD-10-CM | POA: Diagnosis not present

## 2020-08-13 NOTE — Progress Notes (Signed)
PCP: [name], DO Cardiology: Dr. Mayford Knife HF Cardiology: Dr. Shirlee Latch  HPI:  64 y.o. with history of CAD, PAF, and ischemic cardiomyopathy was referred by Dr. Mayford Knife for evaluation of CHF.  Patient was admitted to [facility] in 3/21 with CHF and atrial fibrillation/RVR. Echo showed EF 20-25%.  LHC was done, showing occluded mid LAD with collaterals.  Cardiac MRI showed EF 22%, apical aneurysm, no significant LAD territory viability; therefore, patient did not have CABG or PCI. Patient was additionally found to have right MCA aneurysm.  This was treated with embolization in 4/21.  In 5/21, he had DCCV back to NSR.  He has remained in NSR on amiodarone.  Repeat echo in 6/21 showed EF 20-25% with moderately decreased RV systolic function.  St Jude ICD was placed.  Patient has also been diagnosed with OSA and has started CPAP.   CPX 11/21 with no clear HF limitation.   Recently presented to HF Clinic with Dr. Shirlee Latch on 07/23/20.  Main complaint was bilateral knee pain from OA.  He had been doing cardiac rehab.  Mild dyspnea only with a long walk. He does fatigue by the end of the day.  No chest pain.  Able to do all his ADLs.  Weight was stable.   Today he returns to HF clinic for pharmacist medication titration. At last visit with MD, Marcelline Deist 10 mg daily was initiated. Overall he is feeling well today. No dizziness, lightheadedness, chest pain or palpitations. Feels like he has been less active due to OA and knee injections. No SOB/DOE. Takes furosemide 40 mg QAM/20 mg QPM and has not needed any extra. No LEE, PND or orthopnea. Got COBRA insurance starting January 1st. We are working on medication affordability as he now has a high deductible.    HF Medications: Metoprolol succinate 25 mg daily Entresto 49/51 mg BID Spironolactone 25 mg daily Farxiga 10 mg daily Furosemide 40 mg QAM/20 mg QPM.   Has the patient been experiencing any side effects to the medications prescribed?  no  Does the patient  have any problems obtaining medications due to transportation or finances?   Yes - just lost his job (company is closing) so was forced to get [employer] starting August 16, 2020. I had him sign [employer], [employer] and [employer] patient assistance applications so we would be prepared to submit for PAP if copay cards are unable to drop his copay to a reasonable level.   Understanding of regimen: good Understanding of indications: good Potential of compliance: good Patient understands to avoid NSAIDs. Patient understands to avoid decongestants.    Pertinent Lab Values (07/29/2020): Marland Kitchen Serum creatinine 1.15, BUN 32, Potassium 4.3, Sodium 134  Vital Signs:  . Weight: 253.2 lbs (last clinic weight: 256.4 lbs) . Blood pressure: 98/60 . Heart rate: 100   Assessment: 1. CAD: LHC in 3/21 with occluded mid LAD with collaterals.  Cardiac MRI was not suggestive of LAD territory viability, there was an apical aneurysm but no thrombus.  Patient was managed medically. No chest pain.  - He is on Eliquis for atrial fibrillation and also ASA 81 for cerebral aneurysm embolization.  - Continue Crestor. Keep LDL < 50 ideally.  2. Atrial fibrillation: Paroxysmal, s/p DCCV to NSR in 5/21. He has been maintained in NSR on amiodarone.  - Continue amiodarone 100 mg daily.  Recent LFTs and TSH normal.  Will need regular eye exam.   - Continue Eliquis.  - He discussed atrial fibrillation ablation with Dr. Ladona Ridgel, who  recommended weight loss before considering.  3. Chronic systolic CHF: Ischemic cardiomyopathy.  Echo in 6/21 with EF 20-25%, moderately decreased RV systolic function.  St Jude ICD, not CRT candidate. -He is not volume overloaded on exam. -NYHA class II symptoms.  - Continue furosemide 40 mg QPM/20 mg QPM - Continue metoprolol XL 25 mg daily. - Continue Entresto 49/51 mg BID.  Unable to uptitrate today given low BP.   - Continue spironolactone 25 mg daily. - Continue Farxiga 10 mg daily 4. Right MCA  aneurysm: S/p embolization.  - Following with IR, still on ASA 81 in addition to Eliquis.  Stop ASA when ok with neuro IR.  5. OSA: Using CPAP.    Plan: 1) Medication changes: Based on clinical presentation, vital signs and recent labs will make no medication changes today given low BP in clinic.  2) Follow-up: 1 month with Dr. Jonn Shingles, PharmD, BCPS, [facility], CPP Heart Failure Clinic Pharmacist [phone]

## 2020-08-18 ENCOUNTER — Encounter (HOSPITAL_COMMUNITY)
Admission: RE | Admit: 2020-08-18 | Discharge: 2020-08-18 | Disposition: A | Discharge status: Home / Self Care | Payer: Commercial Managed Care - PPO | Source: Ambulatory Visit | Attending: Cardiology | Admitting: Cardiology

## 2020-08-18 ENCOUNTER — Other Ambulatory Visit: Payer: Self-pay

## 2020-08-18 DIAGNOSIS — Z87891 Personal history of nicotine dependence: Secondary | ICD-10-CM | POA: Insufficient documentation

## 2020-08-18 DIAGNOSIS — I5022 Chronic systolic (congestive) heart failure: Secondary | ICD-10-CM

## 2020-08-18 DIAGNOSIS — R7303 Prediabetes: Secondary | ICD-10-CM | POA: Diagnosis not present

## 2020-08-18 DIAGNOSIS — Z7982 Long term (current) use of aspirin: Secondary | ICD-10-CM | POA: Diagnosis not present

## 2020-08-18 DIAGNOSIS — E785 Hyperlipidemia, unspecified: Secondary | ICD-10-CM | POA: Insufficient documentation

## 2020-08-18 DIAGNOSIS — Z79891 Long term (current) use of opiate analgesic: Secondary | ICD-10-CM | POA: Insufficient documentation

## 2020-08-18 DIAGNOSIS — Z79899 Other long term (current) drug therapy: Secondary | ICD-10-CM | POA: Insufficient documentation

## 2020-08-18 DIAGNOSIS — Z7901 Long term (current) use of anticoagulants: Secondary | ICD-10-CM | POA: Diagnosis not present

## 2020-08-20 ENCOUNTER — Other Ambulatory Visit: Payer: Self-pay

## 2020-08-20 ENCOUNTER — Encounter (HOSPITAL_COMMUNITY)
Admission: RE | Admit: 2020-08-20 | Discharge: 2020-08-20 | Disposition: A | Discharge status: Home / Self Care | Payer: Commercial Managed Care - PPO | Source: Ambulatory Visit | Attending: Cardiology | Admitting: Cardiology

## 2020-08-20 DIAGNOSIS — I5022 Chronic systolic (congestive) heart failure: Secondary | ICD-10-CM | POA: Diagnosis not present

## 2020-08-22 ENCOUNTER — Encounter (HOSPITAL_COMMUNITY)
Admission: RE | Admit: 2020-08-22 | Discharge: 2020-08-22 | Disposition: A | Discharge status: Home / Self Care | Payer: Commercial Managed Care - PPO | Source: Ambulatory Visit | Attending: Cardiology | Admitting: Cardiology

## 2020-08-22 ENCOUNTER — Other Ambulatory Visit: Payer: Self-pay

## 2020-08-22 DIAGNOSIS — I5022 Chronic systolic (congestive) heart failure: Secondary | ICD-10-CM | POA: Diagnosis not present

## 2020-08-22 NOTE — Progress Notes (Signed)
Cardiac Individual Treatment Plan  Patient Details  Name: ELLINGTON GREENSLADE MRN: [id number] Date of Birth: July 28, 1956 Referring Provider:   Flowsheet Row CARDIAC REHAB PHASE II ORIENTATION from 07/03/2020 in [place]  Referring Provider Larey Dresser, MD      Initial Encounter Date:  [place] PHASE II ORIENTATION from 07/03/2020 in [place]  Date 07/03/20      Visit Diagnosis: Heart failure, chronic systolic ([place])  Patient's Home Medications on Admission:  Current Outpatient Medications:    amiodarone (PACERONE) 200 MG tablet, Take 0.5 tablets (100 mg total) by mouth daily., Disp: 15 tablet, Rfl: 11   aspirin EC 81 MG tablet, Take 81 mg by mouth daily., Disp: , Rfl:    colchicine 0.6 MG tablet, Take 0.6 mg by mouth 2 (two) times daily as needed (for gout flares). , Disp: , Rfl:    dapagliflozin propanediol (FARXIGA) 10 MG TABS tablet, Take 1 tablet (10 mg total) by mouth daily before breakfast., Disp: 30 tablet, Rfl: 5   diclofenac sodium (VOLTAREN) 1 % GEL, Apply 2 g topically 2 (two) times daily as needed (to affected sites- for arthritis pain). , Disp: , Rfl:    ELIQUIS 5 MG TABS tablet, TAKE 1 TABLET(5 MG) BY MOUTH TWICE DAILY (Patient taking differently: Take 5 mg by mouth 2 (two) times daily.), Disp: 60 tablet, Rfl: 8   furosemide (LASIX) 40 MG tablet, Take 1 tablet (40 mg total) by mouth in the morning AND 0.5 tablets (20 mg total) every evening., Disp: 45 tablet, Rfl: 3   metoprolol succinate (TOPROL XL) 50 MG 24 hr tablet, Take 1 tablet (50 mg total) by mouth daily., Disp: 30 tablet, Rfl: 5   oxyCODONE-acetaminophen (PERCOCET/ROXICET) 5-325 MG tablet, Take 1 tablet by mouth in the morning, at noon, and at bedtime., Disp: , Rfl:    rosuvastatin (CRESTOR) 5 MG tablet, Take 1 tablet (5 mg total) by mouth daily., Disp: 90 tablet, Rfl: 3   sacubitril-valsartan (ENTRESTO) 49-51 MG,  Take 1 tablet by mouth 2 (two) times daily., Disp: 60 tablet, Rfl: 3   spironolactone (ALDACTONE) 25 MG tablet, Take 1 tablet (25 mg total) by mouth daily., Disp: 90 tablet, Rfl: 3  Past Medical History: Past Medical History:  Diagnosis Date   CAD (coronary artery disease), native coronary artery    chronically occluded mid LAD with left to left collaterals.    Cerebral aneurysm    s/p embolization   Chronic systolic (congestive) heart failure (HCC)    biventricular HF with moderate RV dysfunction and severe LV dysfunction by Cardiac MRI with EF 22% no viability in the apical septal/apical/inferior,mid anterior and true apex.     Gout    HLD (hyperlipidemia)    PAF (paroxysmal atrial fibrillation) (HCC)    s/p DCCV 12/2019   Pre-diabetes    diet controlled, no meds    Tobacco Use: Social History   Tobacco Use  Smoking Status Former Smoker   Packs/day: 0.25   Years: 3.00   Pack years: 0.75   Types: Cigarettes   Quit date: 08/17/2011   Years since quitting: 9.0  Smokeless Tobacco Never Used    Labs: Recent Review Scientist, physiological    Labs for ITP Cardiac and Pulmonary Rehab Latest Ref Rng & Units 11/06/2019 11/07/2019 11/12/2019 11/12/2019 06/10/2020   Cholestrol 0 - 200 mg/dL - 103 - - 137   LDLCALC 0 - 99 mg/dL - 56 - -  68   HDL >40 mg/dL - 40(L) - - 49   Trlycerides <150 mg/dL - 36 - - 99   Hemoglobin A1c 4.8 - 5.6 % - - - - -   PHART 7.350 - 7.450 7.364 - 7.452(H) - -   PCO2ART 32.0 - 48.0 mmHg 81.3(HH) - 51.1(H) - -   HCO3 20.0 - 28.0 mmol/L 44.6(H) - 35.7(H) 37.6(H) -   TCO2 22 - 32 mmol/L - - 37(H) 39(H) -   O2SAT % 93.9 - 97.0 63.0 -      Capillary Blood Glucose: Lab Results  Component Value Date   GLUCAP 152 (H) 11/12/2019   GLUCAP 123 (H) 11/11/2019   GLUCAP 155 (H) 11/11/2019   GLUCAP 127 (H) 11/06/2019     Exercise Target Goals: Exercise Program Goal: Individual exercise prescription set using results from initial 6 min walk test and THRR  while considering  patients activity barriers and safety.   Exercise Prescription Goal: Starting with aerobic activity 30 plus minutes a day, 3 days per week for initial exercise prescription. Provide home exercise prescription and guidelines that participant acknowledges understanding prior to discharge.  Activity Barriers & Risk Stratification:  Activity Barriers & Cardiac Risk Stratification - 07/03/20 1353      Activity Barriers & Cardiac Risk Stratification   Activity Barriers Arthritis;Joint Problems;Deconditioning;Shortness of Breath;Decreased Ventricular Function;Balance Concerns    Cardiac Risk Stratification High           6 Minute Walk:  6 Minute Walk    Row Name 07/03/20 1223         6 Minute Walk   Phase Initial     Distance 832 feet  Half way pateint given wheelchair to push due to knee pain     Walk Time 6 minutes     # of Rest Breaks 0     MPH 1.57     METS 2.17     RPE 13     Perceived Dyspnea  1     VO2 Peak 7.61     Symptoms Yes (comment)     Comments SOB, RPD =1; Bilateral knee pain (8 out of 10)     Resting HR 87 bpm     Resting BP 99/59     Resting Oxygen Saturation  97 %     Exercise Oxygen Saturation  during 6 min walk 97 %     Max Ex. HR 113 bpm     Max Ex. BP 135/80     2 Minute Post BP 118/82            Oxygen Initial Assessment:   Oxygen Re-Evaluation:   Oxygen Discharge (Final Oxygen Re-Evaluation):   Initial Exercise Prescription:  Initial Exercise Prescription - 07/03/20 1300      Date of Initial Exercise RX and Referring Provider   Date 07/03/20    Referring Provider Larey Dresser, MD    Expected Discharge Date 08/29/20      NuStep   Level 1    SPM 85    Minutes 25    METs 1.7      Prescription Details   Frequency (times per week) 3    Duration Progress to 30 minutes of continuous aerobic without signs/symptoms of physical distress      Intensity   THRR 40-80% of Max Heartrate 63-126    Ratings of Perceived  Exertion 11-13    Perceived Dyspnea 0-4      Progression   Progression Continue  progressive overload as per policy without signs/symptoms or physical distress.      Resistance Training   Training Prescription Yes    Weight 3 lbs    Reps 10-15           Perform Capillary Blood Glucose checks as needed.  Exercise Prescription Changes:   Exercise Prescription Changes    Row Name 07/09/20 1600 07/25/20 1430 08/04/20 1400 08/20/20 1430       Response to Exercise   Blood Pressure (Admit) 108/60 102/60 114/60 120/70    Blood Pressure (Exercise) 124/78 116/70 118/74 118/62    Blood Pressure (Exit) 112/74 97/62 94/58 110/70    Heart Rate (Admit) 73 bpm 99 bpm 93 bpm 100 bpm    Heart Rate (Exercise) 99 bpm 110 bpm 106 bpm 112 bpm    Heart Rate (Exit) 78 bpm 88 bpm 93 bpm 104 bpm    Rating of Perceived Exertion (Exercise) _0 Symptoms None None None None    Comments Pt's first day of exerrcise in the CRP2 program Reviewed METs Reviewed Home Exercise Rx Reviewed METs    Duration Progress to 30 minutes of  aerobic without signs/symptoms of physical distress Continue with 30 min of aerobic exercise without signs/symptoms of physical distress. Continue with 30 min of aerobic exercise without signs/symptoms of physical distress. Continue with 30 min of aerobic exercise without signs/symptoms of physical distress.    Intensity THRR unchanged THRR unchanged THRR unchanged THRR unchanged         Progression   Progression Continue to progress workloads to maintain intensity without signs/symptoms of physical distress. Continue to progress workloads to maintain intensity without signs/symptoms of physical distress. Continue to progress workloads to maintain intensity without signs/symptoms of physical distress. Continue to progress workloads to maintain intensity without signs/symptoms of physical distress.    Average METs 2 2.7 2.9 2.6         Resistance Training   Training  Prescription Yes Yes Yes Yes    Weight --  No weights done on Wednesdays 3 lbs 4 lbs 4 lbs    Reps -- 10-15 10-15 10-15    Time -- 10 Minutes 10 Minutes 10 Minutes         Interval Training   Interval Training No No No No         NuStep   Level _1 SPM 85 85 85 80    Minutes _2 METs 2 2.7 2.9 2.6         Home Exercise Plan   Plans to continue exercise at -- -- Home (comment) Home (comment)    Frequency -- -- Add 2 additional days to program exercise sessions. Add 2 additional days to program exercise sessions.    Initial Home Exercises Provided -- -- 08/04/20 08/04/20           Exercise Comments:   Exercise Comments    Row Name 07/09/20 1619 07/25/20 1433 08/04/20 1441 08/20/20 1430     Exercise Comments Pt's first day of exercise in the CRP2 program. Pt tolerated the session well. Reviewed METS. Pt is making progress in the CRP2 program. Pt demonstrates good attendence and is motivated to improve his CV fitness level. Will contiue to work with patient to progress MET level with exercise. Reviewed home exercise Rx with patient. Pt will walk or ride his bike for 30 minutes 2x/week. Pt verbalized understanding  and was provided a copy. Reviewed METs. Pt making progress. Has completed a series of 3 injections in both knees to improve mobility. Demonstrates good attendence.           Exercise Goals and Review:   Exercise Goals    Row Name 07/03/20 1354             Exercise Goals   Increase Physical Activity Yes       Intervention Provide advice, education, support and counseling about physical activity/exercise needs.;Develop an individualized exercise prescription for aerobic and resistive training based on initial evaluation findings, risk stratification, comorbidities and participant's personal goals.       Expected Outcomes Short Term: Attend rehab on a regular basis to increase amount of physical activity.;Long Term: Add in home exercise to make  exercise part of routine and to increase amount of physical activity.;Long Term: Exercising regularly at least 3-5 days a week.       Increase Strength and Stamina Yes       Intervention Provide advice, education, support and counseling about physical activity/exercise needs.;Develop an individualized exercise prescription for aerobic and resistive training based on initial evaluation findings, risk stratification, comorbidities and participant's personal goals.       Expected Outcomes Short Term: Perform resistance training exercises routinely during rehab and add in resistance training at home;Short Term: Increase workloads from initial exercise prescription for resistance, speed, and METs.;Long Term: Improve cardiorespiratory fitness, muscular endurance and strength as measured by increased METs and functional capacity (6MWT)       Able to understand and use rate of perceived exertion (RPE) scale Yes       Intervention Provide education and explanation on how to use RPE scale       Expected Outcomes Short Term: Able to use RPE daily in rehab to express subjective intensity level;Long Term:  Able to use RPE to guide intensity level when exercising independently       Knowledge and understanding of Target Heart Rate Range (THRR) Yes       Intervention Provide education and explanation of THRR including how the numbers were predicted and where they are located for reference       Expected Outcomes Short Term: Able to state/look up THRR;Short Term: Able to use daily as guideline for intensity in rehab;Long Term: Able to use THRR to govern intensity when exercising independently       Understanding of Exercise Prescription Yes       Intervention Provide education, explanation, and written materials on patient's individual exercise prescription       Expected Outcomes Short Term: Able to explain program exercise prescription;Long Term: Able to explain home exercise prescription to exercise independently               Exercise Goals Re-Evaluation :  Exercise Goals Re-Evaluation    Row Name 07/09/20 1616 08/04/20 1436           Exercise Goal Re-Evaluation   Exercise Goals Review Increase Physical Activity;Increase Strength and Stamina;Able to understand and use rate of perceived exertion (RPE) scale;Knowledge and understanding of Target Heart Rate Range (THRR);Understanding of Exercise Prescription Increase Physical Activity;Increase Strength and Stamina;Able to understand and use rate of perceived exertion (RPE) scale;Knowledge and understanding of Target Heart Rate Range (THRR);Able to check pulse independently;Understanding of Exercise Prescription      Comments Pt's first day of exercise in the CRP2 program. Pt tolerated the exercise session well and had no complaints. Pt understnads the  THRR, RPE scale and Exercise Rx. Reviewed Home exercise prescription with patient today. Pt has been receiving a series of 3 injections in his knees to decrease knee pain. Pt is eggar to begin walking at home our to ride his bike. Pt will add 2 days/week of walking or cycling for 30 minutes. Pt verbalized understnading of the home exercise Rx and was provided a copy.      Expected Outcomes Will continue to monitor and progress pat as tolerated. Pt will walk or ride his bike at home 2x/week for 30 minutes.              Discharge Exercise Prescription (Final Exercise Prescription Changes):  Exercise Prescription Changes - 08/20/20 1430      Response to Exercise   Blood Pressure (Admit) 120/70    Blood Pressure (Exercise) 118/62    Blood Pressure (Exit) 110/70    Heart Rate (Admit) 100 bpm    Heart Rate (Exercise) 112 bpm    Heart Rate (Exit) 104 bpm    Rating of Perceived Exertion (Exercise) 11    Symptoms None    Comments Reviewed METs    Duration Continue with 30 min of aerobic exercise without signs/symptoms of physical distress.    Intensity THRR unchanged      Progression   Progression  Continue to progress workloads to maintain intensity without signs/symptoms of physical distress.    Average METs 2.6      Resistance Training   Training Prescription Yes    Weight 4 lbs    Reps 10-15    Time 10 Minutes      Interval Training   Interval Training No      NuStep   Level 5    SPM 80    Minutes 30    METs 2.6      Home Exercise Plan   Plans to continue exercise at Home (comment)    Frequency Add 2 additional days to program exercise sessions.    Initial Home Exercises Provided 08/04/20           Nutrition:  Target Goals: Understanding of nutrition guidelines, daily intake of sodium <1523m, cholesterol <2061m calories 30% from fat and 7% or less from saturated fats, daily to have 5 or more servings of fruits and vegetables.  Biometrics:  Pre Biometrics - 07/03/20 1100      Pre Biometrics   Waist Circumference 48 inches    Hip Circumference 47 inches    Waist to Hip Ratio 1.02 %    Triceps Skinfold 37 mm    % Body Fat 37.7 %    Grip Strength 45 kg    Flexibility 11.25 in    Single Leg Stand 2.02 seconds   High risk for Fall           Nutrition Therapy Plan and Nutrition Goals:  Nutrition Therapy & Goals - 07/21/20 1034      Nutrition Therapy   Diet Low sodium, TLC      Personal Nutrition Goals   Nutrition Goal Pt to build a healthy plate including vegetables, fruits, whole grains, and low-fat dairy products in a heart healthy meal plan.    Personal Goal #2 Pt to keep daily weight and BP log    Personal Goal #3 Pt able to name foods that affect blood glucose    Personal Goal #4 Pt to reduce sodium intake <2000 mg/day      Intervention Plan   Intervention Prescribe, educate and counsel  regarding individualized specific dietary modifications aiming towards targeted core components such as weight, hypertension, lipid management, diabetes, heart failure and other comorbidities.;Nutrition handout(s) given to patient.    Expected Outcomes Long Term  Goal: Adherence to prescribed nutrition plan.;Short Term Goal: A plan has been developed with personal nutrition goals set during dietitian appointment.           Nutrition Assessments:  Nutrition Assessments - 07/18/20 1521      MEDFICTS Scores   Pre Score 83          MEDIFICTS Score Key:  ?70 Need to make dietary changes   40-70 Heart Healthy Diet  ? 40 Therapeutic Level Cholesterol Diet   Picture Your Plate Scores:  <84 Unhealthy dietary pattern with much room for improvement.  41-50 Dietary pattern unlikely to meet recommendations for good health and room for improvement.  51-60 More healthful dietary pattern, with some room for improvement.   >60 Healthy dietary pattern, although there may be some specific behaviors that could be improved.    Nutrition Goals Re-Evaluation:  Nutrition Goals Re-Evaluation    [facility] Name 07/21/20 1036 08/25/20 1441           Goals   Current Weight 257 lb (116.6 kg) 253 lb 15.5 oz (115.2 kg)      Nutrition Goal Pt to build a healthy plate including vegetables, fruits, whole grains, and low-fat dairy products in a heart healthy meal plan. Pt to build a healthy plate including vegetables, fruits, whole grains, and low-fat dairy products in a heart healthy meal plan.             Personal Goal #2 Re-Evaluation   Personal Goal #2 Pt to keep daily weight and BP log Pt to keep daily weight and BP log             Personal Goal #3 Re-Evaluation   Personal Goal #3 Pt able to name foods that affect blood glucose Pt able to name foods that affect blood glucose             Personal Goal #4 Re-Evaluation   Personal Goal #4 Pt to reduce sodium intake <2000 mg/day Pt to reduce sodium intake <2000 mg/day             Nutrition Goals Discharge (Final Nutrition Goals Re-Evaluation):  Nutrition Goals Re-Evaluation - 08/25/20 1441      Goals   Current Weight 253 lb 15.5 oz (115.2 kg)    Nutrition Goal Pt to build a healthy plate including  vegetables, fruits, whole grains, and low-fat dairy products in a heart healthy meal plan.      Personal Goal #2 Re-Evaluation   Personal Goal #2 Pt to keep daily weight and BP log      Personal Goal #3 Re-Evaluation   Personal Goal #3 Pt able to name foods that affect blood glucose      Personal Goal #4 Re-Evaluation   Personal Goal #4 Pt to reduce sodium intake <2000 mg/day           Psychosocial: Target Goals: Acknowledge presence or absence of significant depression and/or stress, maximize coping skills, provide positive support system. Participant is able to verbalize types and ability to use techniques and skills needed for reducing stress and depression.  Initial Review & Psychosocial Screening:  Initial Psych Review & Screening - 07/04/20 0807      Initial Review   Current issues with Current Stress Concerns    Source of Stress Concerns  Financial;Unable to participate in former interests or hobbies;Unable to perform yard/household activities;Chronic Illness;Retirement/disability    Comments Glen's job recently closed he is concerned about his health insurance and finances      [place]? Yes      Barriers   Psychosocial barriers to participate in program The patient should benefit from training in stress management and relaxation.      Screening Interventions   Interventions Encouraged to exercise;Provide feedback about the scores to participant;To provide support and resources with identified psychosocial needs    Expected Outcomes Short Term goal: Utilizing psychosocial counselor, staff and physician to assist with identification of specific Stressors or current issues interfering with healing process. Setting desired goal for each stressor or current issue identified.;Long Term Goal: Stressors or current issues are controlled or eliminated.;Short Term goal: Identification and review with participant of any Quality of Life or Depression concerns  found by scoring the questionnaire.           Quality of Life Scores:  Quality of Life - 07/03/20 1346      Quality of Life   Select Quality of Life      Quality of Life Scores   Health/Function Pre 18.6 %    Socioeconomic Pre 22.94 %    Psych/Spiritual Pre 22.33 %    Family Pre 22.5 %    GLOBAL Pre 20.8 %          Scores of 19 and below usually indicate a poorer quality of life in these areas.  A difference of  2-3 points is a clinically meaningful difference.  A difference of 2-3 points in the total score of the Quality of Life Index has been associated with significant improvement in overall quality of life, self-image, physical symptoms, and general health in studies assessing change in quality of life.  PHQ-9: Recent Review Flowsheet Data    Depression screen [facility] 2/9 07/04/2020   Decreased Interest 0   Down, Depressed, Hopeless 0   PHQ - 2 Score 0     Interpretation of Total Score  Total Score Depression Severity:  1-4 = Minimal depression, 5-9 = Mild depression, 10-14 = Moderate depression, 15-19 = Moderately severe depression, 20-27 = Severe depression   Psychosocial Evaluation and Intervention:  Psychosocial Evaluation - 07/29/20 0733      Psychosocial Evaluation & Interventions   Expected Outcomes Pa           Psychosocial Re-Evaluation:  Psychosocial Re-Evaluation    Row Name 07/29/20 5053 08/22/20 1501           Psychosocial Re-Evaluation   Current issues with Current Stress Concerns Current Stress Concerns      Comments Mr. Kubitz admists to stress secondary to his health. He is no longer working and concerned about his finances and health insurance. He is currently Cobra status. He admits to having a good support system. Enjoys reading, walking, fishing, and participating in [place] activities. Feels as his health and functional capacity improves so will his stress. No psychosocial barriers to participation in cardiac rehab identified. No  interventions needed. Will continue to closely monitor. Mr. Hallum admists to stress secondary to his health. He is no longer working and concerned about his finances and health insurance. He is currently Cobra status. He admits to having a good support system. Enjoys reading, walking, fishing, and participating in [place] activities. Feels as his health and functional capacity improves so will his stress. No psychosocial barriers to participation  in cardiac rehab identified. No interventions needed. Will continue to closely monitor.      Expected Outcomes tient will continue to have a positive attitude and outlook. He will continue to participate in CR. He will utilize hobbies to manage stress in a healthy way. tient will continue to have a positive attitude and outlook. He will continue to participate in CR. He will utilize hobbies to manage stress in a healthy way.      Interventions Encouraged to attend Cardiac Rehabilitation for the exercise;Stress management education Encouraged to attend Cardiac Rehabilitation for the exercise;Stress management education      Continue Psychosocial Services  Follow up required by staff Follow up required by staff      Comments -- Glen's job recently closed he is concerned about his health insurance and finances             Initial Review   Source of Stress Concerns -- Financial;Unable to participate in former interests or hobbies;Unable to perform yard/household activities;Chronic Illness;Retirement/disability             Psychosocial Discharge (Final Psychosocial Re-Evaluation):  Psychosocial Re-Evaluation - 08/22/20 1501      Psychosocial Re-Evaluation   Current issues with Current Stress Concerns    Comments Mr. Caudill admists to stress secondary to his health. He is no longer working and concerned about his finances and health insurance. He is currently Cobra status. He admits to having a good support system. Enjoys reading, walking, fishing, and  participating in [place] activities. Feels as his health and functional capacity improves so will his stress. No psychosocial barriers to participation in cardiac rehab identified. No interventions needed. Will continue to closely monitor.    Expected Outcomes tient will continue to have a positive attitude and outlook. He will continue to participate in CR. He will utilize hobbies to manage stress in a healthy way.    Interventions Encouraged to attend Cardiac Rehabilitation for the exercise;Stress management education    Continue Psychosocial Services  Follow up required by staff    Comments Glen's job recently closed he is concerned about his health insurance and finances      Initial Review   Source of Stress Concerns Financial;Unable to participate in former interests or hobbies;Unable to perform yard/household activities;Chronic Illness;Retirement/disability           Vocational Rehabilitation: Provide vocational rehab assistance to qualifying candidates.   Vocational Rehab Evaluation & Intervention:  Vocational Rehab - 07/04/20 0837      Initial Vocational Rehab Evaluation & Intervention   Assessment shows need for Vocational Rehabilitation No           Education: Education Goals: Education classes will be provided on a weekly basis, covering required topics. Participant will state understanding/return demonstration of topics presented.  Learning Barriers/Preferences:  Learning Barriers/Preferences - 07/03/20 1347      Learning Barriers/Preferences   Learning Barriers Sight   Wears reading glasses   Learning Preferences Audio;Computer/Internet;Group Instruction;Individual Instruction;Pictoral;Written Material;Video;Skilled Demonstration;Verbal Instruction           Education Topics: Hypertension, Hypertension Reduction -Define heart disease and high blood pressure. Discus how high blood pressure affects the body and ways to reduce high blood pressure.   Exercise and  Your Heart -Discuss why it is important to exercise, the FITT principles of exercise, normal and abnormal responses to exercise, and how to exercise safely.   Angina -Discuss definition of angina, causes of angina, treatment of angina, and how to decrease risk of having  angina.   Cardiac Medications -Review what the following cardiac medications are used for, how they affect the body, and side effects that may occur when taking the medications.  Medications include Aspirin, Beta blockers, calcium channel blockers, ACE Inhibitors, angiotensin receptor blockers, diuretics, digoxin, and antihyperlipidemics.   Congestive Heart Failure -Discuss the definition of CHF, how to live with CHF, the signs and symptoms of CHF, and how keep track of weight and sodium intake.   Heart Disease and Intimacy -Discus the effect sexual activity has on the heart, how changes occur during intimacy as we age, and safety during sexual activity.   Smoking Cessation / COPD -Discuss different methods to quit smoking, the health benefits of quitting smoking, and the definition of COPD.   Nutrition I: Fats -Discuss the types of cholesterol, what cholesterol does to the heart, and how cholesterol levels can be controlled.   Nutrition II: Labels -Discuss the different components of food labels and how to read food label   Heart Parts/Heart Disease and PAD -Discuss the anatomy of the heart, the pathway of blood circulation through the heart, and these are affected by heart disease.   Stress I: Signs and Symptoms -Discuss the causes of stress, how stress may lead to anxiety and depression, and ways to limit stress.   Stress II: Relaxation -Discuss different types of relaxation techniques to limit stress.   Warning Signs of Stroke / TIA -Discuss definition of a stroke, what the signs and symptoms are of a stroke, and how to identify when someone is having stroke.   Knowledge Questionnaire Score:   Knowledge Questionnaire Score - 07/03/20 1348      Knowledge Questionnaire Score   Pre Score 21/24           Core Components/Risk Factors/Patient Goals at Admission:  Personal Goals and Risk Factors at Admission - 07/04/20 0837      Core Components/Risk Factors/Patient Goals on Admission    Weight Management Yes;Weight Loss;Obesity    Intervention Weight Management: Develop a combined nutrition and exercise program designed to reach desired caloric intake, while maintaining appropriate intake of nutrient and fiber, sodium and fats, and appropriate energy expenditure required for the weight goal.;Weight Management: Provide education and appropriate resources to help participant work on and attain dietary goals.;Weight Management/Obesity: Establish reasonable short term and long term weight goals.;Obesity: Provide education and appropriate resources to help participant work on and attain dietary goals.    Admit Weight 258 lb 6.1 oz (117.2 kg)    Expected Outcomes Short Term: Continue to assess and modify interventions until short term weight is achieved;Weight Maintenance: Understanding of the daily nutrition guidelines, which includes 25-35% calories from fat, 7% or less cal from saturated fats, less than 245m cholesterol, less than 1.5gm of sodium, & 5 or more servings of fruits and vegetables daily;Long Term: Adherence to nutrition and physical activity/exercise program aimed toward attainment of established weight goal;Weight Loss: Understanding of general recommendations for a balanced deficit meal plan, which promotes 1-2 lb weight loss per week and includes a negative energy balance of [phone] kcal/d;Understanding recommendations for meals to include 15-35% energy as protein, 25-35% energy from fat, 35-60% energy from carbohydrates, less than 2053mof dietary cholesterol, 20-35 gm of total fiber daily;Understanding of distribution of calorie intake throughout the day with the consumption of  4-5 meals/snacks    Diabetes Yes    Intervention Provide education about signs/symptoms and action to take for hypo/hyperglycemia.;Provide education about proper nutrition, including hydration, and aerobic/resistive exercise  prescription along with prescribed medications to achieve blood glucose in normal ranges: Fasting glucose 65-99 mg/dL    Expected Outcomes Short Term: Participant verbalizes understanding of the signs/symptoms and immediate care of hyper/hypoglycemia, proper foot care and importance of medication, aerobic/resistive exercise and nutrition plan for blood glucose control.;Long Term: Attainment of HbA1C < 7%.    Heart Failure Yes    Intervention Provide a combined exercise and nutrition program that is supplemented with education, support and counseling about heart failure. Directed toward relieving symptoms such as shortness of breath, decreased exercise tolerance, and extremity edema.    Expected Outcomes Improve functional capacity of life;Short term: Attendance in program 2-3 days a week with increased exercise capacity. Reported lower sodium intake. Reported increased fruit and vegetable intake. Reports medication compliance.;Short term: Daily weights obtained and reported for increase. Utilizing diuretic protocols set by physician.;Long term: Adoption of self-care skills and reduction of barriers for early signs and symptoms recognition and intervention leading to self-care maintenance.    Lipids Yes    Intervention Provide education and support for participant on nutrition & aerobic/resistive exercise along with prescribed medications to achieve LDL <68m, HDL >458m    Expected Outcomes Short Term: Participant states understanding of desired cholesterol values and is compliant with medications prescribed. Participant is following exercise prescription and nutrition guidelines.;Long Term: Cholesterol controlled with medications as prescribed, with individualized exercise RX and with  personalized nutrition plan. Value goals: LDL < 7054mHDL > 40 mg.    Stress Yes    Intervention Offer individual and/or small group education and counseling on adjustment to heart disease, stress management and health-related lifestyle change. Teach and support self-help strategies.;Refer participants experiencing significant psychosocial distress to appropriate mental health specialists for further evaluation and treatment. When possible, include family members and significant others in education/counseling sessions.    Expected Outcomes Short Term: Participant demonstrates changes in health-related behavior, relaxation and other stress management skills, ability to obtain effective social support, and compliance with psychotropic medications if prescribed.;Long Term: Emotional wellbeing is indicated by absence of clinically significant psychosocial distress or social isolation.           Core Components/Risk Factors/Patient Goals Review:   Goals and Risk Factor Review    Row Name 07/09/20 1450 07/29/20 0735 08/22/20 1502         Core Components/Risk Factors/Patient Goals Review   Personal Goals Review Weight Management/Obesity;Lipids;Diabetes;Stress;Heart Failure Weight Management/Obesity;Lipids;Diabetes;Stress;Heart Failure Weight Management/Obesity;Lipids;Diabetes;Stress;Heart Failure     Review Mr. ManMarullos multiple CAD risk factors. He is eager to participate in cardiac rehab for exercise and risk factor education. He hopes to regain stamina and strength while in the program and "get back to baseling". Expect to see progression towards goals over next 30 days. Mr. ManScallons multiple CAD risk factors. He is eager to continue to  participate in cardiac rehab for exercise and risk factor education. He is taking his medications as prescribed and following his CHF action plan. He utilizes his family and friends and faith in God to manage his stress levels. Denies current symptoms of  stress including depression, anxiety, and sleeplessness. He has met with RD on several occasions for nutrition education including low NA, carb modified diet. GleMonica Martinezntinues to do well with exercise at cardiac rehab. Glen's vital signs have been stable     Expected Outcomes Patient will continue to participate in CR for risk factor modifications Patient will continue to participate in CR for risk factor modifications Patient will continue to participate  in CR for risk factor modifications            Core Components/Risk Factors/Patient Goals at Discharge (Final Review):   Goals and Risk Factor Review - 08/22/20 1502      Core Components/Risk Factors/Patient Goals Review   Personal Goals Review Weight Management/Obesity;Lipids;Diabetes;Stress;Heart Failure    Review Monica Martinez continues to do well with exercise at cardiac rehab. Glen's vital signs have been stable    Expected Outcomes Patient will continue to participate in CR for risk factor modifications           ITP Comments:  ITP Comments    Row Name 07/04/20 0734 07/09/20 1445 07/29/20 0720 08/22/20 1459     ITP Comments Dr Fransico Him MD, Medical Director Mr. Weniger completed his first cardiac rehab exercise session today and tolerated very well. Worked to an RPE of 11. Mets 2.0. Nutrition consult with RD. VSS. Denied complaints. Patient goals are to increase his stamina and strength and to "get back to baseline". Mr. Innis has completed 8 cardiac rehab exercise session since admission. He is tolerating workload increases and currently averaging 2.7 mets. VSS. States he is adhering to his CHF action plan by taking all medications and weighing daily at home. He is struggling with his HF low NA diet although his weight is consistantly decreasing since the addition of farxiga. First day of exercise weight 117.2, most recent weight 114.6. He continues to work towards a goal of increasing his stamina and strength and "getting back to  baseline". Mr. Wehrly feels he is progressing towards his goals and should see more progression over the next 30 days. 30 Day ITP Review. Germaine has good attendance and participation in phase 2 cardiac rehab.           Comments: See ITP Comments.Monica Martinez will complete cardiac rehab on 08/26/20. Barnet Pall, RN,BSN 08/26/2020 3:14 PM

## 2020-08-25 ENCOUNTER — Encounter (HOSPITAL_COMMUNITY)
Admission: RE | Admit: 2020-08-25 | Discharge: 2020-08-25 | Disposition: A | Discharge status: Home / Self Care | Payer: Commercial Managed Care - PPO | Source: Ambulatory Visit | Attending: Cardiology | Admitting: Cardiology

## 2020-08-25 ENCOUNTER — Other Ambulatory Visit: Payer: Self-pay

## 2020-08-25 DIAGNOSIS — I5022 Chronic systolic (congestive) heart failure: Secondary | ICD-10-CM

## 2020-08-27 ENCOUNTER — Ambulatory Visit (HOSPITAL_COMMUNITY)
Admission: RE | Admit: 2020-08-27 | Discharge: 2020-08-27 | Disposition: A | Discharge status: Home / Self Care | Payer: Commercial Managed Care - PPO | Source: Ambulatory Visit | Attending: Cardiology | Admitting: Cardiology

## 2020-08-27 ENCOUNTER — Encounter (HOSPITAL_COMMUNITY)
Admission: RE | Admit: 2020-08-27 | Discharge: 2020-08-27 | Disposition: A | Discharge status: Home / Self Care | Payer: Commercial Managed Care - PPO | Source: Ambulatory Visit | Attending: Cardiology | Admitting: Cardiology

## 2020-08-27 ENCOUNTER — Other Ambulatory Visit: Payer: Self-pay

## 2020-08-27 VITALS — BP 98/60 | HR 100 | Wt 253.2 lb

## 2020-08-27 DIAGNOSIS — Z79899 Other long term (current) drug therapy: Secondary | ICD-10-CM | POA: Insufficient documentation

## 2020-08-27 DIAGNOSIS — I5022 Chronic systolic (congestive) heart failure: Secondary | ICD-10-CM

## 2020-08-27 DIAGNOSIS — I480 Paroxysmal atrial fibrillation: Secondary | ICD-10-CM | POA: Insufficient documentation

## 2020-08-27 DIAGNOSIS — Z7901 Long term (current) use of anticoagulants: Secondary | ICD-10-CM | POA: Insufficient documentation

## 2020-08-27 DIAGNOSIS — I255 Ischemic cardiomyopathy: Secondary | ICD-10-CM | POA: Insufficient documentation

## 2020-08-27 DIAGNOSIS — I2510 Atherosclerotic heart disease of native coronary artery without angina pectoris: Secondary | ICD-10-CM | POA: Insufficient documentation

## 2020-08-27 DIAGNOSIS — I509 Heart failure, unspecified: Secondary | ICD-10-CM | POA: Diagnosis present

## 2020-08-27 NOTE — Patient Instructions (Addendum)
It was a pleasure seeing you today!  MEDICATIONS: -No medication changes today -Call if you have questions about your medications.  NEXT APPOINTMENT: Return to clinic in 1 month with Dr. McLean.  In general, to take care of your heart failure: -Limit your fluid intake to 2 Liters (half-gallon) per day.   -Limit your salt intake to ideally 2-3 grams (2000-3000 mg) per day. -Weigh yourself daily and record, and bring that "weight diary" to your next appointment.  (Weight gain of 2-3 pounds in 1 day typically means fluid weight.) -The medications for your heart are to help your heart and help you live longer.   -Please contact us before stopping any of your heart medications.  Call the clinic at [phone] with questions or to reschedule future appointments.  

## 2020-08-29 ENCOUNTER — Encounter (HOSPITAL_COMMUNITY)
Admission: RE | Admit: 2020-08-29 | Discharge: 2020-08-29 | Disposition: A | Discharge status: Home / Self Care | Payer: Commercial Managed Care - PPO | Source: Ambulatory Visit | Attending: Cardiology | Admitting: Cardiology

## 2020-08-29 ENCOUNTER — Other Ambulatory Visit: Payer: Self-pay

## 2020-08-29 VITALS — Ht 70.75 in | Wt 254.4 lb

## 2020-08-29 DIAGNOSIS — I5022 Chronic systolic (congestive) heart failure: Secondary | ICD-10-CM | POA: Diagnosis not present

## 2020-08-29 NOTE — Progress Notes (Signed)
Discharge Progress Report  Patient Details  Name: MATAIO MELE MRN: [id number] Date of Birth: [birth year] Referring Provider:   Flowsheet Row CARDIAC REHAB PHASE II ORIENTATION from 07/03/2020 in [place]  Referring Provider Larey Dresser, MD       Number of Visits: 20  Reason for Discharge:  Patient reached a stable level of exercise. Patient independent in their exercise. Patient has met program and personal goals.  Smoking History:  Social History   Tobacco Use  Smoking Status Former Smoker  . Packs/day: 0.25  . Years: 3.00  . Pack years: 0.75  . Types: Cigarettes  . Quit date: 08/17/2011  . Years since quitting: 9.0  Smokeless Tobacco Never Used    Diagnosis:  Heart failure, chronic systolic (HCC)  ADL UCSD:   Initial Exercise Prescription:  Initial Exercise Prescription - 07/03/20 1300      Date of Initial Exercise RX and Referring Provider   Date 07/03/20    Referring Provider Larey Dresser, MD    Expected Discharge Date 08/29/20      NuStep   Level 1    SPM 85    Minutes 25    METs 1.7      Prescription Details   Frequency (times per week) 3    Duration Progress to 30 minutes of continuous aerobic without signs/symptoms of physical distress      Intensity   THRR 40-80% of Max Heartrate 63-126    Ratings of Perceived Exertion 11-13    Perceived Dyspnea 0-4      Progression   Progression Continue progressive overload as per policy without signs/symptoms or physical distress.      Resistance Training   Training Prescription Yes    Weight 3 lbs    Reps 10-15           Discharge Exercise Prescription (Final Exercise Prescription Changes):  Exercise Prescription Changes - 08/29/20 1400      Response to Exercise   Blood Pressure (Admit) 112/64    Blood Pressure (Exercise) 108/70    Blood Pressure (Exit) 108/70    Heart Rate (Admit) 92 bpm    Heart Rate (Exercise) 99 bpm    Heart Rate (Exit) 86  bpm    Rating of Perceived Exertion (Exercise) 11    Symptoms None    Comments Pt graduated from the cRP2 program today    Duration Continue with 30 min of aerobic exercise without signs/symptoms of physical distress.    Intensity THRR unchanged      Progression   Progression Continue to progress workloads to maintain intensity without signs/symptoms of physical distress.    Average METs 2.6      Resistance Training   Training Prescription Yes    Weight 4 lbs    Reps 10-15    Time 10 Minutes      Interval Training   Interval Training No      NuStep   Level 5    SPM 85    Minutes 30    METs 2.6      Home Exercise Plan   Plans to continue exercise at Home (comment)    Frequency Add 2 additional days to program exercise sessions.    Initial Home Exercises Provided 08/04/20           Functional Capacity:  6 Minute Walk    Row Name 07/03/20 1223 08/27/20 1330       6 Minute Walk  Phase Initial Discharge    Distance 832 feet  Half way pateint given wheelchair to push due to knee pain 1058 feet    Distance % Change -- 27.16 %    Distance Feet Change -- 226 ft    Walk Time 6 minutes 6 minutes    # of Rest Breaks 0 0    MPH 1.57 2    METS 2.17 2.6    RPE 13 11    Perceived Dyspnea  1 1    VO2 Peak 7.61 9.11    Symptoms Yes (comment) Yes (comment)    Comments SOB, RPD =1; Bilateral knee pain (8 out of 10) SOB. RPD = 1. Bilateral chronic knee pain 7/10.    Resting HR 87 bpm 95 bpm    Resting BP 99/59 105/65    Resting Oxygen Saturation  97 % 96 %    Exercise Oxygen Saturation  during 6 min walk 97 % 96 %    Max Ex. HR 113 bpm 129 bpm    Max Ex. BP 135/80 118/68    2 Minute Post BP 118/82 102/70           Psychological, QOL, Others - Outcomes: PHQ 2/9: Depression screen [facility] 2/9 08/29/2020 07/04/2020  Decreased Interest 0 0  Down, Depressed, Hopeless 0 0  PHQ - 2 Score 0 0    Quality of Life:  Quality of Life - 08/27/20 1500      Quality of Life Scores    Health/Function Post 15.7 %    Socioeconomic Post 19.36 %    Psych/Spiritual Post 21 %    Family Post 24 %    GLOBAL Post 18.38 %           Personal Goals: Goals established at orientation with interventions provided to work toward goal.  Personal Goals and Risk Factors at Admission - 07/04/20 0837      Core Components/Risk Factors/Patient Goals on Admission    Weight Management Yes;Weight Loss;Obesity    Intervention Weight Management: Develop a combined nutrition and exercise program designed to reach desired caloric intake, while maintaining appropriate intake of nutrient and fiber, sodium and fats, and appropriate energy expenditure required for the weight goal.;Weight Management: Provide education and appropriate resources to help participant work on and attain dietary goals.;Weight Management/Obesity: Establish reasonable short term and long term weight goals.;Obesity: Provide education and appropriate resources to help participant work on and attain dietary goals.    Admit Weight 258 lb 6.1 oz (117.2 kg)    Expected Outcomes Short Term: Continue to assess and modify interventions until short term weight is achieved;Weight Maintenance: Understanding of the daily nutrition guidelines, which includes 25-35% calories from fat, 7% or less cal from saturated fats, less than 264m cholesterol, less than 1.5gm of sodium, & 5 or more servings of fruits and vegetables daily;Long Term: Adherence to nutrition and physical activity/exercise program aimed toward attainment of established weight goal;Weight Loss: Understanding of general recommendations for a balanced deficit meal plan, which promotes 1-2 lb weight loss per week and includes a negative energy balance of [phone] kcal/d;Understanding recommendations for meals to include 15-35% energy as protein, 25-35% energy from fat, 35-60% energy from carbohydrates, less than 2052mof dietary cholesterol, 20-35 gm of total fiber daily;Understanding of  distribution of calorie intake throughout the day with the consumption of 4-5 meals/snacks    Diabetes Yes    Intervention Provide education about signs/symptoms and action to take for hypo/hyperglycemia.;Provide education about proper nutrition, including  hydration, and aerobic/resistive exercise prescription along with prescribed medications to achieve blood glucose in normal ranges: Fasting glucose 65-99 mg/dL    Expected Outcomes Short Term: Participant verbalizes understanding of the signs/symptoms and immediate care of hyper/hypoglycemia, proper foot care and importance of medication, aerobic/resistive exercise and nutrition plan for blood glucose control.;Long Term: Attainment of HbA1C < 7%.    Heart Failure Yes    Intervention Provide a combined exercise and nutrition program that is supplemented with education, support and counseling about heart failure. Directed toward relieving symptoms such as shortness of breath, decreased exercise tolerance, and extremity edema.    Expected Outcomes Improve functional capacity of life;Short term: Attendance in program 2-3 days a week with increased exercise capacity. Reported lower sodium intake. Reported increased fruit and vegetable intake. Reports medication compliance.;Short term: Daily weights obtained and reported for increase. Utilizing diuretic protocols set by physician.;Long term: Adoption of self-care skills and reduction of barriers for early signs and symptoms recognition and intervention leading to self-care maintenance.    Lipids Yes    Intervention Provide education and support for participant on nutrition & aerobic/resistive exercise along with prescribed medications to achieve LDL <33m, HDL >415m    Expected Outcomes Short Term: Participant states understanding of desired cholesterol values and is compliant with medications prescribed. Participant is following exercise prescription and nutrition guidelines.;Long Term: Cholesterol controlled  with medications as prescribed, with individualized exercise RX and with personalized nutrition plan. Value goals: LDL < 7072mHDL > 40 mg.    Stress Yes    Intervention Offer individual and/or small group education and counseling on adjustment to heart disease, stress management and health-related lifestyle change. Teach and support self-help strategies.;Refer participants experiencing significant psychosocial distress to appropriate mental health specialists for further evaluation and treatment. When possible, include family members and significant others in education/counseling sessions.    Expected Outcomes Short Term: Participant demonstrates changes in health-related behavior, relaxation and other stress management skills, ability to obtain effective social support, and compliance with psychotropic medications if prescribed.;Long Term: Emotional wellbeing is indicated by absence of clinically significant psychosocial distress or social isolation.            Personal Goals Discharge:  Goals and Risk Factor Review    Row Name 07/09/20 1450 07/29/20 0735 08/22/20 1502 08/29/20 1350       Core Components/Risk Factors/Patient Goals Review   Personal Goals Review Weight Management/Obesity;Lipids;Diabetes;Stress;Heart Failure Weight Management/Obesity;Lipids;Diabetes;Stress;Heart Failure Weight Management/Obesity;Lipids;Diabetes;Stress;Heart Failure Weight Management/Obesity;Lipids;Diabetes;Stress;Heart Failure    Review Mr. ManChaviras multiple CAD risk factors. He is eager to participate in cardiac rehab for exercise and risk factor education. He hopes to regain stamina and strength while in the program and "get back to baseling". Expect to see progression towards goals over next 30 days. Mr. ManPenroses multiple CAD risk factors. He is eager to continue to  participate in cardiac rehab for exercise and risk factor education. He is taking his medications as prescribed and following his CHF  action plan. He utilizes his family and friends and faith in God to manage his stress levels. Denies current symptoms of stress including depression, anxiety, and sleeplessness. He has met with RD on several occasions for nutrition education including low NA, carb modified diet. GleMonica Martinezntinues to do well with exercise at cardiac rehab. Glen's vital signs have been stable GleMonica Martinezntinues to do well with exercise at cardiac rehab. Glen's vital signs have been stable. GleMonica Martinezmpletes cardiac rehab  today 08/16/20    Expected Outcomes Patient  will continue to participate in CR for risk factor modifications Patient will continue to participate in CR for risk factor modifications Patient will continue to participate in CR for risk factor modifications Ariez will continue walk upon discharge from phase 2 cardiac rehab follow nutrtion and lifestyle modifications.           Exercise Goals and Review:  Exercise Goals    Row Name 07/03/20 1354             Exercise Goals   Increase Physical Activity Yes       Intervention Provide advice, education, support and counseling about physical activity/exercise needs.;Develop an individualized exercise prescription for aerobic and resistive training based on initial evaluation findings, risk stratification, comorbidities and participant's personal goals.       Expected Outcomes Short Term: Attend rehab on a regular basis to increase amount of physical activity.;Long Term: Add in home exercise to make exercise part of routine and to increase amount of physical activity.;Long Term: Exercising regularly at least 3-5 days a week.       Increase Strength and Stamina Yes       Intervention Provide advice, education, support and counseling about physical activity/exercise needs.;Develop an individualized exercise prescription for aerobic and resistive training based on initial evaluation findings, risk stratification, comorbidities and participant's personal goals.        Expected Outcomes Short Term: Perform resistance training exercises routinely during rehab and add in resistance training at home;Short Term: Increase workloads from initial exercise prescription for resistance, speed, and METs.;Long Term: Improve cardiorespiratory fitness, muscular endurance and strength as measured by increased METs and functional capacity (6MWT)       Able to understand and use rate of perceived exertion (RPE) scale Yes       Intervention Provide education and explanation on how to use RPE scale       Expected Outcomes Short Term: Able to use RPE daily in rehab to express subjective intensity level;Long Term:  Able to use RPE to guide intensity level when exercising independently       Knowledge and understanding of Target Heart Rate Range (THRR) Yes       Intervention Provide education and explanation of THRR including how the numbers were predicted and where they are located for reference       Expected Outcomes Short Term: Able to state/look up THRR;Short Term: Able to use daily as guideline for intensity in rehab;Long Term: Able to use THRR to govern intensity when exercising independently       Understanding of Exercise Prescription Yes       Intervention Provide education, explanation, and written materials on patient's individual exercise prescription       Expected Outcomes Short Term: Able to explain program exercise prescription;Long Term: Able to explain home exercise prescription to exercise independently              Exercise Goals Re-Evaluation:  Exercise Goals Re-Evaluation    Row Name 07/09/20 1616 08/04/20 1436 08/29/20 1448         Exercise Goal Re-Evaluation   Exercise Goals Review Increase Physical Activity;Increase Strength and Stamina;Able to understand and use rate of perceived exertion (RPE) scale;Knowledge and understanding of Target Heart Rate Range (THRR);Understanding of Exercise Prescription Increase Physical Activity;Increase Strength and  Stamina;Able to understand and use rate of perceived exertion (RPE) scale;Knowledge and understanding of Target Heart Rate Range (THRR);Able to check pulse independently;Understanding of Exercise Prescription Increase Physical Activity;Increase Strength and Stamina;Able to understand  and use rate of perceived exertion (RPE) scale;Able to understand and use Dyspnea scale;Able to check pulse independently;Understanding of Exercise Prescription     Comments Pt's first day of exercise in the CRP2 program. Pt tolerated the exercise session well and had no complaints. Pt understnads the THRR, RPE scale and Exercise Rx. Reviewed Home exercise prescription with patient today. Pt has been receiving a series of 3 injections in his knees to decrease knee pain. Pt is eggar to begin walking at home our to ride his bike. Pt will add 2 days/week of walking or cycling for 30 minutes. Pt verbalized understnading of the home exercise Rx and was provided a copy. Pt graduated rrom the CRP2 program today. Pt made progress in the CRP2 program and will continue his exercise at home by walking and/or riding his bike. Pt is also considering joining the CR maintainence program. Pt was provided copy of his discharge exercise Rx.     Expected Outcomes Will continue to monitor and progress pat as tolerated. Pt will walk or ride his bike at home 2x/week for 30 minutes. Pt will continue his exercise at home by walking and riding his bike, 3-5x/week.            Nutrition & Weight - Outcomes:  Pre Biometrics - 07/03/20 1100      Pre Biometrics   Waist Circumference 48 inches    Hip Circumference 47 inches    Waist to Hip Ratio 1.02 %    Triceps Skinfold 37 mm    % Body Fat 37.7 %    Grip Strength 45 kg    Flexibility 11.25 in    Single Leg Stand 2.02 seconds   High risk for Fall          Post Biometrics - 08/27/20 1645       Post  Biometrics   Height 5' 10.75" (1.797 m)    Weight 115.4 kg    Waist Circumference 48  inches    Hip Circumference 46.75 inches    Waist to Hip Ratio 1.03 %    BMI (Calculated) 35.74    Triceps Skinfold 35 mm    % Body Fat 37.3 %    Grip Strength 45 kg    Flexibility 14.75 in    Single Leg Stand 13.56 seconds           Nutrition:  Nutrition Therapy & Goals - 07/21/20 1034      Nutrition Therapy   Diet Low sodium, TLC      Personal Nutrition Goals   Nutrition Goal Pt to build a healthy plate including vegetables, fruits, whole grains, and low-fat dairy products in a heart healthy meal plan.    Personal Goal #2 Pt to keep daily weight and BP log    Personal Goal #3 Pt able to name foods that affect blood glucose    Personal Goal #4 Pt to reduce sodium intake <2000 mg/day      Intervention Plan   Intervention Prescribe, educate and counsel regarding individualized specific dietary modifications aiming towards targeted core components such as weight, hypertension, lipid management, diabetes, heart failure and other comorbidities.;Nutrition handout(s) given to patient.    Expected Outcomes Long Term Goal: Adherence to prescribed nutrition plan.;Short Term Goal: A plan has been developed with personal nutrition goals set during dietitian appointment.           Nutrition Discharge:  Nutrition Assessments - 09/02/20 1350      MEDFICTS Scores   Post  Score 27           Education Questionnaire Score:  Knowledge Questionnaire Score - 08/29/20 1453      Knowledge Questionnaire Score   Post Score 22/24           Goals reviewed with patient; copy given to patient.Pt graduated from cardiac rehab program today with completion of 22 exercise sessions in Phase II. Pt maintained good attendance and progressed nicely during his participation in rehab as evidenced by increased MET level.   Medication list reconciled. Repeat  PHQ score-0  .  Pt has made significant lifestyle changes and should be commended for his success. Pt feels he has achieved his goals during  cardiac rehab. Glen increased his distance on his post exercise walk test by 226 feet.  Pt plans to continue exercise by walking and riding his bike. We are proud of Glen's progress.Barnet Pall, RN,BSN 09/04/2020 9:22 AM

## 2020-09-09 ENCOUNTER — Telehealth (HOSPITAL_COMMUNITY): Payer: Self-pay | Admitting: Pharmacist

## 2020-09-09 ENCOUNTER — Telehealth (HOSPITAL_COMMUNITY): Payer: Self-pay | Admitting: Pharmacy Technician

## 2020-09-09 ENCOUNTER — Other Ambulatory Visit (HOSPITAL_COMMUNITY): Payer: Self-pay | Admitting: Cardiology

## 2020-09-09 MED ORDER — DAPAGLIFLOZIN PROPANEDIOL 10 MG PO TABS
10.0000 mg | ORAL_TABLET | Freq: Every day | ORAL | 3 refills | Status: DC
Start: 2020-09-09 — End: 2020-09-09

## 2020-09-09 MED ORDER — APIXABAN 5 MG PO TABS
5.0000 mg | ORAL_TABLET | Freq: Two times a day (BID) | ORAL | 3 refills | Status: DC
Start: 2020-09-09 — End: 2020-09-09

## 2020-09-09 MED ORDER — ENTRESTO 49-51 MG PO TABS
1.0000 | ORAL_TABLET | Freq: Two times a day (BID) | ORAL | 3 refills | Status: DC
Start: 2020-09-09 — End: 2020-09-09

## 2020-09-09 MED FILL — ELIQUIS 5 MG TABLET: 5 | 90 days supply | Qty: 180 | Fill #0

## 2020-09-09 MED FILL — ENTRESTO 49 MG-51 MG TABLET: 49-51 | 90 days supply | Qty: 180 | Fill #0

## 2020-09-09 MED FILL — FARXIGA 10 MG TABLET: 10 | 90 days supply | Qty: 90 | Fill #0

## 2020-09-09 NOTE — Telephone Encounter (Signed)
Sent prescription refills for [place], Eliquis and Farxiga to [facility].  Karle Plumber, PharmD, BCPS, BCCP, CPP [facility] Pharmacist [phone]

## 2020-09-09 NOTE — Telephone Encounter (Signed)
Was able to get co-pay cards for Richard Graham and Richard Graham for the patient.  With these cards his deductible has been met, his current 90 day co-pay of Entresto is $10, Eliquis is $30 and Wilder Glade is $0.  Entresto Card RxBin: [id number] PCN: [id number] Member ID: [id number] Group ID: [id number]  Eliquis Card BIN [id number] PCN LOYALTY ID [id number] Group [id number]  [place] [id number] PCN CN Group [id number] ID [id number]  Called and updated the patient. Will mail the RXs from [place]. Provided the patient [facility] phone number. He is going to call and give them his card information. He is aware how to order his refills, when the time comes.  Charlann Boxer, CPhT

## 2020-09-18 ENCOUNTER — Ambulatory Visit (INDEPENDENT_AMBULATORY_CARE_PROVIDER_SITE_OTHER): Payer: Commercial Managed Care - PPO

## 2020-09-18 DIAGNOSIS — I5022 Chronic systolic (congestive) heart failure: Secondary | ICD-10-CM | POA: Diagnosis not present

## 2020-09-18 DIAGNOSIS — I255 Ischemic cardiomyopathy: Secondary | ICD-10-CM

## 2020-09-18 LAB — CUP PACEART REMOTE DEVICE CHECK
Battery Remaining Longevity: 101 mo
Battery Remaining Percentage: 93 %
Battery Voltage: 3.01 V
Brady Statistic RV Percent Paced: 1 %
Date Time Interrogation Session: 20220203043040
HighPow Impedance: 79 Ohm
Implantable Lead Implant Date: 20210805
Implantable Lead Location: 753860
Implantable Pulse Generator Implant Date: 20210805
Lead Channel Impedance Value: 410 Ohm
Lead Channel Pacing Threshold Amplitude: 0.75 V
Lead Channel Pacing Threshold Pulse Width: 0.5 ms
Lead Channel Sensing Intrinsic Amplitude: 11.7 mV
Lead Channel Setting Pacing Amplitude: 2.5 V
Lead Channel Setting Pacing Pulse Width: 0.5 ms
Lead Channel Setting Sensing Sensitivity: 0.5 mV
Pulse Gen Serial Number: 111025604

## 2020-09-24 NOTE — Progress Notes (Signed)
Remote ICD transmission.   

## 2020-10-07 ENCOUNTER — Encounter (HOSPITAL_COMMUNITY): Payer: Self-pay | Admitting: Cardiology

## 2020-10-14 ENCOUNTER — Encounter (HOSPITAL_COMMUNITY): Payer: Self-pay | Admitting: Cardiology

## 2020-10-14 ENCOUNTER — Ambulatory Visit (HOSPITAL_COMMUNITY)
Admission: RE | Admit: 2020-10-14 | Discharge: 2020-10-14 | Disposition: A | Discharge status: Home / Self Care | Payer: Commercial Managed Care - PPO | Source: Ambulatory Visit | Attending: Cardiology | Admitting: Cardiology

## 2020-10-14 ENCOUNTER — Other Ambulatory Visit: Payer: Self-pay

## 2020-10-14 VITALS — BP 104/60 | HR 73 | Wt 252.9 lb

## 2020-10-14 DIAGNOSIS — Z87891 Personal history of nicotine dependence: Secondary | ICD-10-CM | POA: Insufficient documentation

## 2020-10-14 DIAGNOSIS — Z7982 Long term (current) use of aspirin: Secondary | ICD-10-CM | POA: Insufficient documentation

## 2020-10-14 DIAGNOSIS — I5022 Chronic systolic (congestive) heart failure: Secondary | ICD-10-CM | POA: Insufficient documentation

## 2020-10-14 DIAGNOSIS — Z9989 Dependence on other enabling machines and devices: Secondary | ICD-10-CM | POA: Diagnosis not present

## 2020-10-14 DIAGNOSIS — Z7984 Long term (current) use of oral hypoglycemic drugs: Secondary | ICD-10-CM | POA: Insufficient documentation

## 2020-10-14 DIAGNOSIS — I2510 Atherosclerotic heart disease of native coronary artery without angina pectoris: Secondary | ICD-10-CM | POA: Diagnosis not present

## 2020-10-14 DIAGNOSIS — Z79891 Long term (current) use of opiate analgesic: Secondary | ICD-10-CM | POA: Insufficient documentation

## 2020-10-14 DIAGNOSIS — Z7901 Long term (current) use of anticoagulants: Secondary | ICD-10-CM | POA: Insufficient documentation

## 2020-10-14 DIAGNOSIS — Z8679 Personal history of other diseases of the circulatory system: Secondary | ICD-10-CM | POA: Insufficient documentation

## 2020-10-14 DIAGNOSIS — Z791 Long term (current) use of non-steroidal anti-inflammatories (NSAID): Secondary | ICD-10-CM | POA: Insufficient documentation

## 2020-10-14 DIAGNOSIS — I480 Paroxysmal atrial fibrillation: Secondary | ICD-10-CM | POA: Insufficient documentation

## 2020-10-14 DIAGNOSIS — G4733 Obstructive sleep apnea (adult) (pediatric): Secondary | ICD-10-CM | POA: Diagnosis not present

## 2020-10-14 DIAGNOSIS — Z79899 Other long term (current) drug therapy: Secondary | ICD-10-CM | POA: Diagnosis not present

## 2020-10-14 LAB — CBC
HCT: 50.8 % (ref 39.0–52.0)
Hemoglobin: 16.5 g/dL (ref 13.0–17.0)
MCH: 31.3 pg (ref 26.0–34.0)
MCHC: 32.5 g/dL (ref 30.0–36.0)
MCV: 96.4 fL (ref 80.0–100.0)
Platelets: 237 10*3/uL (ref 150–400)
RBC: 5.27 MIL/uL (ref 4.22–5.81)
RDW: 14 % (ref 11.5–15.5)
WBC: 7.8 10*3/uL (ref 4.0–10.5)
nRBC: 0 % (ref 0.0–0.2)

## 2020-10-14 LAB — COMPREHENSIVE METABOLIC PANEL
ALT: 23 U/L (ref 0–44)
AST: 26 U/L (ref 15–41)
Albumin: 4.4 g/dL (ref 3.5–5.0)
Alkaline Phosphatase: 59 U/L (ref 38–126)
Anion gap: 11 (ref 5–15)
BUN: 18 mg/dL (ref 8–23)
CO2: 27 mmol/L (ref 22–32)
Calcium: 9.4 mg/dL (ref 8.9–10.3)
Chloride: 95 mmol/L — ABNORMAL LOW (ref 98–111)
Creatinine, Ser: 1.12 mg/dL (ref 0.61–1.24)
GFR, Estimated: >60 mL/min (ref >60)
Glucose, Bld: 102 mg/dL — ABNORMAL HIGH (ref 70–99)
Potassium: 4.3 mmol/L (ref 3.5–5.1)
Sodium: 133 mmol/L — ABNORMAL LOW (ref 135–145)
Total Bilirubin: 0.9 mg/dL (ref 0.3–1.2)
Total Protein: 7.3 g/dL (ref 6.5–8.1)

## 2020-10-14 LAB — TSH: TSH: 1.722 u[IU]/mL (ref 0.350–4.500)

## 2020-10-14 MED ORDER — METOPROLOL SUCCINATE ER 50 MG PO TB24
75.0000 mg | ORAL_TABLET | Freq: Every day | ORAL | 3 refills | Status: DC
Start: 2020-10-14 — End: 2021-01-16

## 2020-10-14 NOTE — Patient Instructions (Addendum)
Labs done today. We will contact you only if your labs are abnormal.  INCREASE Metoprolol XL to 75mg  (1 & 1/2 tablet) by mouth daily.  No other medication changes were made. Please continue all current medications as prescribed.  Your physician recommends that you schedule a follow-up appointment in: 3 months   If you have any questions or concerns before your next appointment please send a message through [employer] or call our office at [phone].    TO LEAVE A MESSAGE FOR THE NURSE SELECT OPTION 2, PLEASE LEAVE A MESSAGE INCLUDING: . YOUR NAME . DATE OF BIRTH . CALL BACK NUMBER . REASON FOR CALL**this is important as we prioritize the call backs  YOU WILL RECEIVE A CALL BACK THE SAME DAY AS LONG AS YOU CALL BEFORE 4:00 PM   Do the following things EVERYDAY: 1) Weigh yourself in the morning before breakfast. Write it down and keep it in a log. 2) Take your medicines as prescribed 3) Eat low salt foods--Limit salt (sodium) to 2000 mg per day.  4) Stay as active as you can everyday 5) Limit all fluids for the day to less than 2 liters   At the [facility], you and your health needs are our priority. As part of our continuing mission to provide you with exceptional heart care, we have created designated Provider Care Teams. These Care Teams include your primary Cardiologist (physician) and Advanced Practice Providers (APPs- Physician Assistants and Nurse Practitioners) who all work together to provide you with the care you need, when you need it.   You may see any of the following providers on your designated Care Team at your next follow up: [phone] Dr Marland Kitchen . Dr Arvilla Meres . Marca Ancona, NP . Tonye Becket, PA . Robbie Lis, PharmD   Please be sure to bring in all your medications bottles to every appointment.

## 2020-10-14 NOTE — Progress Notes (Signed)
PCP: [name], DO Cardiology: Dr. Mayford Knife HF Cardiology: Dr. Shirlee Latch  65 y.o. with history of CAD, PAF, and ischemic cardiomyopathy was referred by Dr. Mayford Knife for evaluation of CHF.  Patent was admitted to [facility] in 3/21 with CHF and atrial fibrillation/RVR. Echo showed EF 20-25%.  LHC was done, showing occluded mid LAD with collaterals.  Cardiac MRI showed EF 22%, apical aneurysm, no significant LAD territory viability; therefore, patient did not have CABG or PCI. Patient was additionally found to have right MCA aneurysm.  This was treated with embolization in 4/21.  In 5/21, he had DCCV back to NSR.  He has remained in NSR on amiodarone.  Repeat echo in 6/21 showed EF 20-25% with moderately decreased RV systolic function.  St Jude ICD was placed.  Patient has also been diagnosed with OSA and has started CPAP.   CPX 11/21 with no clear HF limitation.   Patient returns for followup of CHF.  He has finished cardiac rehab.  No dyspnea walking on flat ground.  Mild generalized fatigue.  No chest pain.  Limited by bilateral knee pain.  No lightheadedness.  Weight is down 1 lb.   ECG (personally reviewed): NSR, RBBB, LAFB.   Labs (9/21): K 4.9, creatinine 0.93 Labs (10/21): LDL 68, HDL 49, LFTs normal, TSH normal Labs (11/21): K 4.2, creatinine 0.95 Labs (12/21): K 4.3, creatinine 1.15  PMH: 1. Cerebral aneurysm: Right MCA aneurysm, embolized in 4/21 by IR.  2. Atrial fibrillation: Paroxysmal.  - DCCV 5/21 3. CAD: LHC (3/21) with totally occluded mid LAD with collaterals.  4. Chronic systolic CHF: Ischemic cardiomyopathy.   - Cardiac MRI (5/21): EF 22%, apical aneurysm, LAD territory scarring without evidence for significant viability. RV EF 31%.  - Echo (6/21): EF 20-25%, moderately decreased RV systolic function.  - St Jude ICD.  - CPX (11/21): peak VO2 17.9 (81% predicted), VE/VCO2 slope 25, RER 1.17 => no clear HF limitation.  5. Gout 6. OSA: Using CPAP.   Social History    Socioeconomic History  . Marital status: Married    Spouse name: Not on file  . Number of children: Not on file  . Years of education: Not on file  . Highest education level: Some college, no degree  Occupational History  . Not on file  Tobacco Use  . Smoking status: Former Smoker    Packs/day: 0.25    Years: 3.00    Pack years: 0.75    Types: Cigarettes    Quit date: 08/17/2011    Years since quitting: 9.1  . Smokeless tobacco: Never Used  Vaping Use  . Vaping Use: Never used  Substance and Sexual Activity  . Alcohol use: Yes    Alcohol/week: 1.0 standard drink    Types: 1 Glasses of wine per week    Comment: occasional wine  . Drug use: No  . Sexual activity: Yes  Other Topics Concern  . Not on file  Social History Narrative  . Not on file   Social Determinants of Health   Financial Resource Strain: Not on file  Food Insecurity: Not on file  Transportation Needs: Not on file  Physical Activity: Not on file  Stress: Not on file  Social Connections: Not on file  Intimate Partner Violence: Not on file   FH: No premature CAD or CHF.   ROS: All systems reviewed and negative except as per HPI.   Current Outpatient Medications  Medication Sig Dispense Refill  . amiodarone (PACERONE) 200 MG tablet  Take 0.5 tablets (100 mg total) by mouth daily. 15 tablet 11  . apixaban (ELIQUIS) 5 MG TABS tablet Take 1 tablet (5 mg total) by mouth 2 (two) times daily. 180 tablet 3  . aspirin EC 81 MG tablet Take 81 mg by mouth daily.    . colchicine 0.6 MG tablet Take 0.6 mg by mouth 2 (two) times daily as needed (for gout flares).     . dapagliflozin propanediol (FARXIGA) 10 MG TABS tablet Take 1 tablet (10 mg total) by mouth daily. 90 tablet 3  . diclofenac sodium (VOLTAREN) 1 % GEL Apply 2 g topically 2 (two) times daily as needed (to affected sites- for arthritis pain).     . furosemide (LASIX) 40 MG tablet Take 1 tablet (40 mg total) by mouth in the morning AND 0.5 tablets (20 mg  total) every evening. 45 tablet 3  . oxyCODONE-acetaminophen (PERCOCET/ROXICET) 5-325 MG tablet Take 1 tablet by mouth in the morning, at noon, and at bedtime.    . rosuvastatin (CRESTOR) 5 MG tablet Take 1 tablet (5 mg total) by mouth daily. 90 tablet 3  . sacubitril-valsartan (ENTRESTO) 49-51 MG Take 1 tablet by mouth 2 (two) times daily. 180 tablet 3  . spironolactone (ALDACTONE) 25 MG tablet Take 1 tablet (25 mg total) by mouth daily. 90 tablet 3  . metoprolol succinate (TOPROL XL) 50 MG 24 hr tablet Take 1.5 tablets (75 mg total) by mouth daily. 135 tablet 3   No current facility-administered medications for this encounter.   BP 104/60   Pulse 73   Wt 114.7 kg (252 lb 14.4 oz)   SpO2 96%   BMI 35.52 kg/m  General: NAD Neck: No JVD, no thyromegaly or thyroid nodule.  Lungs: Clear to auscultation bilaterally with normal respiratory effort. CV: Nondisplaced PMI.  Heart regular S1/S2, no S3/S4, no murmur.  No peripheral edema.  No carotid bruit.  Normal pedal pulses.  Abdomen: Soft, nontender, no hepatosplenomegaly, no distention.  Skin: Intact without lesions or rashes.  Neurologic: Alert and oriented x 3.  Psych: Normal affect. Extremities: No clubbing or cyanosis.  HEENT: Normal.   Assessment/Plan: 1. CAD: LHC in 3/21 with occluded mid LAD with collaterals.  Cardiac MRI was not suggestive of LAD territory viability, there was an apical aneurysm but no thrombus.  Patient was managed medically. No chest pain.  - He is on Eliquis for atrial fibrillation and also ASA 81 for cerebral aneurysm embolization.  - Continue Crestor, good lipids in 10/21.  2. Atrial fibrillation: Paroxysmal, s/p DCCV to NSR in 5/21. He has been maintained in NSR on amiodarone.  - Continue amiodarone 100 mg daily.  Check LFTs and TSH.  Will need regular eye exam.   - Continue Eliquis. CBC today.  - He discussed atrial fibrillation ablation with Dr. Ladona Ridgel, who recommended weight loss before considering.  3.  Chronic systolic CHF: Ischemic cardiomyopathy.  Echo in 6/21 with EF 20-25%, moderately decreased RV systolic function.  St Jude ICD, not CRT candidate.  CPX in 11/21 with minimal HF limitation. He is not volume overloaded on exam.  NYHA class II symptoms.  - Continue Entresto 49/51 bid.  - Continue Lasix 40 qam/20 qpm.  - Continue spironolactone 25 mg daily. BMET today.  - Continue Farxiga 10 mg daily.   - Increase Toprol XL to 75 mg daily.   4. Right MCA aneurysm: S/p embolization.  - Following with IR, still on ASA 81 in addition to Eliquis.  Stop ASA  when ok with neuro IR.  5. OSA: Using CPAP.   Followup in 3 months.    Marca Ancona 10/14/2020

## 2020-11-06 ENCOUNTER — Other Ambulatory Visit (HOSPITAL_BASED_OUTPATIENT_CLINIC_OR_DEPARTMENT_OTHER): Payer: Self-pay

## 2020-11-11 ENCOUNTER — Other Ambulatory Visit: Payer: Self-pay | Admitting: Cardiology

## 2020-11-13 ENCOUNTER — Telehealth (HOSPITAL_COMMUNITY): Payer: Self-pay | Admitting: Pharmacy Technician

## 2020-11-13 NOTE — Telephone Encounter (Signed)
Patient called in and had a question about using co-pay cards. He is considering switching insurance plans and wanted to ensure he would still be able to use the co-pay cards for [place] and Eliquis that he currently has.  I explained that as long as he stays with a commercial plan that we would be able to use the co-pay cards. If the patient becomes uninsured or a Medicare participant, we would contact the manufacturer for assistance if needed.  Patient understood and was appreciative.  Archer Asa, CPhT

## 2020-12-05 ENCOUNTER — Other Ambulatory Visit (HOSPITAL_COMMUNITY): Payer: Self-pay

## 2020-12-05 ENCOUNTER — Telehealth (HOSPITAL_COMMUNITY): Payer: Self-pay | Admitting: Pharmacy Technician

## 2020-12-05 MED FILL — Apixaban Tab 5 MG: ORAL | 90 days supply | Qty: 180 | Fill #0 | Status: CN

## 2020-12-05 MED FILL — Dapagliflozin Propanediol Tab 10 MG (Base Equivalent): ORAL | 90 days supply | Qty: 90 | Fill #0 | Status: CN

## 2020-12-05 MED FILL — Sacubitril-Valsartan Tab 49-51 MG: ORAL | 90 days supply | Qty: 180 | Fill #0 | Status: AC

## 2020-12-05 MED FILL — Dapagliflozin Propanediol Tab 10 MG (Base Equivalent): ORAL | 90 days supply | Qty: 90 | Fill #0 | Status: AC

## 2020-12-05 MED FILL — Sacubitril-Valsartan Tab 49-51 MG: ORAL | 90 days supply | Qty: 180 | Fill #0 | Status: CN

## 2020-12-05 MED FILL — Apixaban Tab 5 MG: ORAL | 90 days supply | Qty: 180 | Fill #0 | Status: AC

## 2020-12-05 NOTE — Telephone Encounter (Signed)
Patient called in requesting a refill of Farxiga, Eliquis and Entresto. Total 90 day co-pay with co-pay cards is $40.   Confirmed that [facility] would mail it out to the patient.  Archer Asa, CPhT

## 2020-12-10 ENCOUNTER — Other Ambulatory Visit (HOSPITAL_COMMUNITY): Payer: Self-pay

## 2020-12-18 ENCOUNTER — Ambulatory Visit (INDEPENDENT_AMBULATORY_CARE_PROVIDER_SITE_OTHER): Payer: BC Managed Care – PPO

## 2020-12-18 DIAGNOSIS — I255 Ischemic cardiomyopathy: Secondary | ICD-10-CM | POA: Diagnosis not present

## 2020-12-18 LAB — CUP PACEART REMOTE DEVICE CHECK
Battery Remaining Longevity: 111 mo
Battery Remaining Percentage: 90 %
Battery Voltage: 3.01 V
Brady Statistic RV Percent Paced: 1 %
Date Time Interrogation Session: 20220505030044
HighPow Impedance: 79 Ohm
Implantable Lead Implant Date: 20210805
Implantable Lead Location: 753860
Implantable Pulse Generator Implant Date: 20210805
Lead Channel Impedance Value: 410 Ohm
Lead Channel Pacing Threshold Amplitude: 0.75 V
Lead Channel Pacing Threshold Pulse Width: 0.5 ms
Lead Channel Sensing Intrinsic Amplitude: 11.7 mV
Lead Channel Setting Pacing Amplitude: 2.5 V
Lead Channel Setting Pacing Pulse Width: 0.5 ms
Lead Channel Setting Sensing Sensitivity: 0.5 mV
Pulse Gen Serial Number: 111025604

## 2020-12-23 DIAGNOSIS — Z7189 Other specified counseling: Secondary | ICD-10-CM | POA: Diagnosis not present

## 2020-12-23 DIAGNOSIS — I4819 Other persistent atrial fibrillation: Secondary | ICD-10-CM | POA: Diagnosis not present

## 2020-12-31 ENCOUNTER — Telehealth: Payer: Self-pay | Admitting: Internal Medicine

## 2020-12-31 NOTE — Telephone Encounter (Signed)
Reviewed chart.  Referral note states: "swine flu live vaccine in highschool, had a severe reaction and is concerned about getting the COVID vaccine now. and consider doing testing to reassure him to get covid vaccine since he is hesitant and is a high risk patient Vaccine counseling"  The Pfizer and Moderna vaccine are different than the swine flu vaccine.  If he is still hesitant at receiving then we can do a graded vaccine dose administration in our office - needs to schedule for a new patient visit in the morning then.  Thank you.

## 2020-12-31 NOTE — Telephone Encounter (Signed)
Please advise to Dr Reuel Derby recommendation

## 2020-12-31 NOTE — Telephone Encounter (Signed)
Could someone please review his chart before scheduling him as a new patient?  Please advise.

## 2021-01-01 NOTE — Telephone Encounter (Signed)
I am fine with it. I am not sure that I need to approve Dr. Elmyra Ricks recommendation. Go ahead and schedule the patient.   Malachi Bonds, MD [facility]

## 2021-01-01 NOTE — Telephone Encounter (Signed)
Called & left voicemail to schedule new patient appointment.

## 2021-01-08 NOTE — Progress Notes (Signed)
Remote ICD transmission.   

## 2021-01-16 ENCOUNTER — Encounter (HOSPITAL_COMMUNITY): Payer: Self-pay | Admitting: Cardiology

## 2021-01-16 ENCOUNTER — Ambulatory Visit (HOSPITAL_COMMUNITY)
Admission: RE | Admit: 2021-01-16 | Discharge: 2021-01-16 | Disposition: A | Discharge status: Home / Self Care | Payer: BC Managed Care – PPO | Source: Ambulatory Visit | Attending: Cardiology | Admitting: Cardiology

## 2021-01-16 ENCOUNTER — Other Ambulatory Visit: Payer: Self-pay

## 2021-01-16 VITALS — BP 102/74 | HR 87 | Ht 70.75 in | Wt 254.0 lb

## 2021-01-16 DIAGNOSIS — Z791 Long term (current) use of non-steroidal anti-inflammatories (NSAID): Secondary | ICD-10-CM | POA: Diagnosis not present

## 2021-01-16 DIAGNOSIS — G4733 Obstructive sleep apnea (adult) (pediatric): Secondary | ICD-10-CM | POA: Diagnosis not present

## 2021-01-16 DIAGNOSIS — I2510 Atherosclerotic heart disease of native coronary artery without angina pectoris: Secondary | ICD-10-CM | POA: Diagnosis not present

## 2021-01-16 DIAGNOSIS — Z79899 Other long term (current) drug therapy: Secondary | ICD-10-CM | POA: Diagnosis not present

## 2021-01-16 DIAGNOSIS — I2582 Chronic total occlusion of coronary artery: Secondary | ICD-10-CM | POA: Diagnosis not present

## 2021-01-16 DIAGNOSIS — Z9989 Dependence on other enabling machines and devices: Secondary | ICD-10-CM | POA: Diagnosis not present

## 2021-01-16 DIAGNOSIS — I480 Paroxysmal atrial fibrillation: Secondary | ICD-10-CM | POA: Diagnosis not present

## 2021-01-16 DIAGNOSIS — Z87891 Personal history of nicotine dependence: Secondary | ICD-10-CM | POA: Insufficient documentation

## 2021-01-16 DIAGNOSIS — Z7901 Long term (current) use of anticoagulants: Secondary | ICD-10-CM | POA: Diagnosis not present

## 2021-01-16 DIAGNOSIS — I671 Cerebral aneurysm, nonruptured: Secondary | ICD-10-CM | POA: Insufficient documentation

## 2021-01-16 DIAGNOSIS — I255 Ischemic cardiomyopathy: Secondary | ICD-10-CM | POA: Diagnosis not present

## 2021-01-16 DIAGNOSIS — Z7982 Long term (current) use of aspirin: Secondary | ICD-10-CM | POA: Insufficient documentation

## 2021-01-16 DIAGNOSIS — Z7984 Long term (current) use of oral hypoglycemic drugs: Secondary | ICD-10-CM | POA: Insufficient documentation

## 2021-01-16 DIAGNOSIS — I5022 Chronic systolic (congestive) heart failure: Secondary | ICD-10-CM | POA: Insufficient documentation

## 2021-01-16 LAB — CBC
HCT: 51.3 % (ref 39.0–52.0)
Hemoglobin: 16.7 g/dL (ref 13.0–17.0)
MCH: 31.8 pg (ref 26.0–34.0)
MCHC: 32.6 g/dL (ref 30.0–36.0)
MCV: 97.7 fL (ref 80.0–100.0)
Platelets: 238 10*3/uL (ref 150–400)
RBC: 5.25 MIL/uL (ref 4.22–5.81)
RDW: 13.9 % (ref 11.5–15.5)
WBC: 9.6 10*3/uL (ref 4.0–10.5)
nRBC: 0 % (ref 0.0–0.2)

## 2021-01-16 LAB — COMPREHENSIVE METABOLIC PANEL
ALT: 21 U/L (ref 0–44)
AST: 25 U/L (ref 15–41)
Albumin: 4.6 g/dL (ref 3.5–5.0)
Alkaline Phosphatase: 56 U/L (ref 38–126)
Anion gap: 9 (ref 5–15)
BUN: 20 mg/dL (ref 8–23)
CO2: 29 mmol/L (ref 22–32)
Calcium: 9.5 mg/dL (ref 8.9–10.3)
Chloride: 99 mmol/L (ref 98–111)
Creatinine, Ser: 1.13 mg/dL (ref 0.61–1.24)
GFR, Estimated: >60 mL/min (ref >60)
Glucose, Bld: 108 mg/dL — ABNORMAL HIGH (ref 70–99)
Potassium: 4.4 mmol/L (ref 3.5–5.1)
Sodium: 137 mmol/L (ref 135–145)
Total Bilirubin: 0.9 mg/dL (ref 0.3–1.2)
Total Protein: 7.4 g/dL (ref 6.5–8.1)

## 2021-01-16 LAB — TSH: TSH: 2.428 u[IU]/mL (ref 0.350–4.500)

## 2021-01-16 MED ORDER — METOPROLOL SUCCINATE ER 100 MG PO TB24
100.0000 mg | ORAL_TABLET | Freq: Every day | ORAL | 6 refills | Status: DC
Start: 2021-01-16 — End: 2021-03-16

## 2021-01-16 NOTE — Patient Instructions (Signed)
Increase Metoprolol to 100 mg Daily  **Discuss with Dr Corliss Skains if you can stop your Aspirin as you are on Eliquis  Labs done today, we will call you for abnormal results  Your physician recommends that you schedule a follow-up appointment in: 3 months with echocardiogram  If you have any questions or concerns before your next appointment please send [place] a message through [place] or call our office at [phone].    TO LEAVE A MESSAGE FOR THE NURSE SELECT OPTION 2, PLEASE LEAVE A MESSAGE INCLUDING: . YOUR NAME . DATE OF BIRTH . CALL BACK NUMBER . REASON FOR CALL**this is important as we prioritize the call backs  YOU WILL RECEIVE A CALL BACK THE SAME DAY AS LONG AS YOU CALL BEFORE 4:00 PM  At the [facility], you and your health needs are our priority. As part of our continuing mission to provide you with exceptional heart care, we have created designated Provider Care Teams. These Care Teams include your primary Cardiologist (physician) and Advanced Practice Providers (APPs- Physician Assistants and Nurse Practitioners) who all work together to provide you with the care you need, when you need it.   You may see any of the following providers on your designated Care Team at your next follow up: Marland Kitchen Dr Arvilla Meres . Dr Marca Ancona . Dr Thornell Mule . Tonye Becket, NP . Robbie Lis, PA . Shanda Bumps [place],NP . Karle Plumber, PharmD   Please be sure to bring in all your medications bottles to every appointment.

## 2021-01-17 NOTE — Progress Notes (Signed)
PCP: [name], DO Cardiology: Dr. Mayford Knife HF Cardiology: Dr. Shirlee Latch  65 y.o. with history of CAD, PAF, and ischemic cardiomyopathy was referred by Dr. Mayford Knife for evaluation of CHF.  Patent was admitted to [name] in 3/21 with CHF and atrial fibrillation/RVR. Echo showed EF 20-25%.  LHC was done, showing occluded mid LAD with collaterals.  Cardiac MRI showed EF 22%, apical aneurysm, no significant LAD territory viability; therefore, patient did not have CABG or PCI. Patient was additionally found to have right MCA aneurysm.  This was treated with embolization in 4/21.  In 5/21, he had DCCV back to NSR.  He has remained in NSR on amiodarone.  Repeat echo in 6/21 showed EF 20-25% with moderately decreased RV systolic function.  St Jude ICD was placed.  Patient has also been diagnosed with OSA and has started CPAP.   CPX 11/21 with no clear HF limitation.   Patient returns for followup of CHF.  Weight is up 2 lbs.  He has not been as active recently, has been helping take care of his chronically ill brother.  He gets tired after walking about 50 yards but not really short of breath.  No chest pain.  No orthopnea/PND.    ECG (personally reviewed): NSR, LAFB, RBBB, old ASMI  Labs (9/21): K 4.9, creatinine 0.93 Labs (10/21): LDL 68, HDL 49, LFTs normal, TSH normal Labs (11/21): K 4.2, creatinine 0.95 Labs (12/21): K 4.3, creatinine 1.15 Labs (3/22): K 4.3, creatinine 1.12, LFTs normal, TSH normal, hgb 16.5  PMH: 1. Cerebral aneurysm: Right MCA aneurysm, embolized in 4/21 by IR.  2. Atrial fibrillation: Paroxysmal.  - DCCV 5/21 3. CAD: LHC (3/21) with totally occluded mid LAD with collaterals.  4. Chronic systolic CHF: Ischemic cardiomyopathy.   - Cardiac MRI (5/21): EF 22%, apical aneurysm, LAD territory scarring without evidence for significant viability. RV EF 31%.  - Echo (6/21): EF 20-25%, moderately decreased RV systolic function.  - St Jude ICD.  - CPX (11/21): peak VO2 17.9 (81%  predicted), VE/VCO2 slope 25, RER 1.17 => no clear HF limitation.  5. Gout 6. OSA: Using CPAP.   Social History   Socioeconomic History  . Marital status: Married    Spouse name: Not on file  . Number of children: Not on file  . Years of education: Not on file  . Highest education level: Some college, no degree  Occupational History  . Not on file  Tobacco Use  . Smoking status: Former Smoker    Packs/day: 0.25    Years: 3.00    Pack years: 0.75    Types: Cigarettes    Quit date: 08/17/2011    Years since quitting: 9.4  . Smokeless tobacco: Never Used  Vaping Use  . Vaping Use: Never used  Substance and Sexual Activity  . Alcohol use: Yes    Alcohol/week: 1.0 standard drink    Types: 1 Glasses of wine per week    Comment: occasional wine  . Drug use: No  . Sexual activity: Yes  Other Topics Concern  . Not on file  Social History Narrative  . Not on file   Social Determinants of Health   Financial Resource Strain: Not on file  Food Insecurity: Not on file  Transportation Needs: Not on file  Physical Activity: Not on file  Stress: Not on file  Social Connections: Not on file  Intimate Partner Violence: Not on file   FH: No premature CAD or CHF.   ROS: All systems  reviewed and negative except as per HPI.   Current Outpatient Medications  Medication Sig Dispense Refill  . amiodarone (PACERONE) 200 MG tablet Take 0.5 tablets (100 mg total) by mouth daily. 15 tablet 11  . apixaban (ELIQUIS) 5 MG TABS tablet TAKE 1 TABLET (5 MG TOTAL) BY MOUTH 2 TIMES DAILY. 180 tablet 3  . aspirin EC 81 MG tablet Take 81 mg by mouth daily.    . colchicine 0.6 MG tablet Take 0.6 mg by mouth 2 (two) times daily as needed (for gout flares).     . dapagliflozin propanediol (FARXIGA) 10 MG TABS tablet TAKE 1 TABLET (10 MG TOTAL) BY MOUTH DAILY. 90 tablet 3  . diclofenac sodium (VOLTAREN) 1 % GEL Apply 2 g topically 2 (two) times daily as needed (to affected sites- for arthritis pain).      . furosemide (LASIX) 40 MG tablet TAKE 1 TABLET(40 MG) BY MOUTH DAILY 90 tablet 3  . mupirocin ointment (BACTROBAN) 2 % Apply 1 application topically 2 (two) times daily.    Marland Kitchen oxyCODONE-acetaminophen (PERCOCET/ROXICET) 5-325 MG tablet Take 1 tablet by mouth in the morning, at noon, and at bedtime.    . rosuvastatin (CRESTOR) 5 MG tablet Take 1 tablet (5 mg total) by mouth daily. 90 tablet 3  . sacubitril-valsartan (ENTRESTO) 49-51 MG TAKE 1 TABLET BY MOUTH 2 TIMES DAILY. 180 tablet 3  . spironolactone (ALDACTONE) 25 MG tablet Take 1 tablet (25 mg total) by mouth daily. 90 tablet 3  . metoprolol succinate (TOPROL XL) 100 MG 24 hr tablet Take 1 tablet (100 mg total) by mouth daily. 30 tablet 6   No current facility-administered medications for this encounter.   BP 102/74   Pulse 87   Ht 5' 10.75" (1.797 m)   Wt 115.2 kg (254 lb)   SpO2 97%   BMI 35.68 kg/m  General: NAD Neck: Thick. No JVD, no thyromegaly or thyroid nodule.  Lungs: Clear to auscultation bilaterally with normal respiratory effort. CV: Nondisplaced PMI.  Heart regular S1/S2, no S3/S4, no murmur.  No peripheral edema.  No carotid bruit.  Normal pedal pulses.  Abdomen: Soft, nontender, no hepatosplenomegaly, no distention.  Skin: Intact without lesions or rashes.  Neurologic: Alert and oriented x 3.  Psych: Normal affect. Extremities: No clubbing or cyanosis.  HEENT: Normal.   Assessment/Plan: 1. CAD: LHC in 3/21 with occluded mid LAD with collaterals.  Cardiac MRI was not suggestive of LAD territory viability, there was an apical aneurysm but no thrombus.  Patient was managed medically. No chest pain.  - He is on Eliquis for atrial fibrillation and also ASA 81 for cerebral aneurysm embolization.  - Continue Crestor, good lipids in 10/21.  2. Atrial fibrillation: Paroxysmal, s/p DCCV to NSR in 5/21. He has been maintained in NSR on amiodarone.  - Continue amiodarone 100 mg daily.  Check LFTs and TSH today.  Will need  regular eye exam.   - Continue Eliquis. CBC today.  - He discussed atrial fibrillation ablation with Dr. Ladona Ridgel, who recommended weight loss before considering.  3. Chronic systolic CHF: Ischemic cardiomyopathy.  Echo in 6/21 with EF 20-25%, moderately decreased RV systolic function.  St Jude ICD, not CRT candidate.  CPX in 11/21 with minimal HF limitation. He is not volume overloaded on exam.  NYHA class II symptoms.  - Continue Entresto 49/51 bid.  - Continue Lasix 40 mg daily.  - Continue spironolactone 25 mg daily. BMET today.  - Continue Farxiga 10 mg daily.   -  Increase Toprol XL to 100 mg daily.   - Echo at followup in 3 months.  4. Right MCA aneurysm: S/p embolization.  - Following with IR, still on ASA 81 in addition to Eliquis.  Stop ASA when ok with neuro IR => he will ask IR if he can stop now given Eliquis use.   5. OSA: Using CPAP.   Followup in 3 months with echo.    Marca Ancona 01/17/2021

## 2021-01-19 ENCOUNTER — Other Ambulatory Visit: Payer: Self-pay | Admitting: Cardiology

## 2021-01-21 ENCOUNTER — Other Ambulatory Visit: Payer: Self-pay | Admitting: Cardiology

## 2021-01-22 ENCOUNTER — Telehealth (HOSPITAL_COMMUNITY): Payer: Self-pay | Admitting: *Deleted

## 2021-01-22 NOTE — Telephone Encounter (Signed)
Pt left vm requesting return call. I called pt back no answer/left vm requesting return call.

## 2021-01-22 NOTE — Telephone Encounter (Signed)
Pt left vm stating he had a missed call from our office but no voice message. I called pt back pt did not answer I left a 2nd detailed message stating I received his message and this is my second message to him. I asked that he return my call and if I do not answer to please leave a  detailed voice message and I will return his call.

## 2021-03-06 ENCOUNTER — Other Ambulatory Visit: Payer: Self-pay | Admitting: Cardiology

## 2021-03-09 ENCOUNTER — Telehealth (HOSPITAL_COMMUNITY): Payer: Self-pay | Admitting: Pharmacy Technician

## 2021-03-09 ENCOUNTER — Other Ambulatory Visit (HOSPITAL_COMMUNITY): Payer: Self-pay

## 2021-03-09 MED FILL — Dapagliflozin Propanediol Tab 10 MG (Base Equivalent): ORAL | 30 days supply | Qty: 30 | Fill #1 | Status: CN

## 2021-03-09 MED FILL — Sacubitril-Valsartan Tab 49-51 MG: ORAL | 90 days supply | Qty: 180 | Fill #1 | Status: AC

## 2021-03-09 MED FILL — Apixaban Tab 5 MG: ORAL | 90 days supply | Qty: 180 | Fill #1 | Status: AC

## 2021-03-09 MED FILL — Dapagliflozin Propanediol Tab 10 MG (Base Equivalent): ORAL | 90 days supply | Qty: 90 | Fill #1 | Status: CN

## 2021-03-09 NOTE — Telephone Encounter (Signed)
Advanced Heart Failure Patient Advocate Encounter  Patient called in ready to refill some of his medications that come from [facility]. Provided the phone number [phone].  Archer Asa, CPhT

## 2021-03-09 NOTE — Telephone Encounter (Signed)
Advanced Heart Failure Patient Advocate Encounter  Patient has changed insurance policies, now has a high deductible. Richard Graham is unaffordable even with the co-pay card. [place] not [place] the patient because he has Nurse, learning disability. Have had some success with Jardiance assistance for commercial patient's in the past.   Would be worth sending in an assistance application for [employer]. Once approved, can switch the patient over to [place].  Called to update patient, left him a message to call back so we can talk about the potential change. Sent Richard Graham (CMA) a request for a month of Farxiga samples so the patient has some to take in the meantime.

## 2021-03-10 ENCOUNTER — Other Ambulatory Visit (HOSPITAL_COMMUNITY): Payer: Self-pay

## 2021-03-10 NOTE — Telephone Encounter (Signed)
Sent in [employer] application via fax.  Will follow up.   

## 2021-03-10 NOTE — Telephone Encounter (Signed)
Advanced Heart Failure Patient Advocate Encounter  Spoke with patient. He was reluctant to switch from [place] to [place] due to hearing about side effects from other people. I explained to him that if he tries and fails Jardiance, then we could attempt a PA for [place]. He would have to try and fail Jardiance first in order to do that. Requested that he get a call from [facility] for medication consultation.   Started an application for [facility] assistance since [place] would still be a few hundred dollars even with the co-pay card.   Will fax in application once signatures are obtained.

## 2021-03-16 ENCOUNTER — Other Ambulatory Visit (HOSPITAL_COMMUNITY): Payer: Self-pay

## 2021-03-16 ENCOUNTER — Other Ambulatory Visit (HOSPITAL_COMMUNITY): Payer: Self-pay | Admitting: *Deleted

## 2021-03-16 MED ORDER — METOPROLOL SUCCINATE ER 100 MG PO TB24: 100.0000 mg | ORAL_TABLET | Freq: Every day | ORAL | 3 refills | Status: DC

## 2021-03-19 ENCOUNTER — Ambulatory Visit (INDEPENDENT_AMBULATORY_CARE_PROVIDER_SITE_OTHER): Payer: BC Managed Care – PPO

## 2021-03-19 DIAGNOSIS — I5022 Chronic systolic (congestive) heart failure: Secondary | ICD-10-CM

## 2021-03-19 DIAGNOSIS — I255 Ischemic cardiomyopathy: Secondary | ICD-10-CM | POA: Diagnosis not present

## 2021-03-19 LAB — CUP PACEART REMOTE DEVICE CHECK
Battery Remaining Longevity: 109 mo
Battery Remaining Percentage: 89 %
Battery Voltage: 2.99 V
Brady Statistic RV Percent Paced: 1 %
Date Time Interrogation Session: 20220804021358
HighPow Impedance: 84 Ohm
Implantable Lead Implant Date: 20210805
Implantable Lead Location: 753860
Implantable Pulse Generator Implant Date: 20210805
Lead Channel Impedance Value: 410 Ohm
Lead Channel Pacing Threshold Amplitude: 0.75 V
Lead Channel Pacing Threshold Pulse Width: 0.5 ms
Lead Channel Sensing Intrinsic Amplitude: 11.7 mV
Lead Channel Setting Pacing Amplitude: 2.5 V
Lead Channel Setting Pacing Pulse Width: 0.5 ms
Lead Channel Setting Sensing Sensitivity: 0.5 mV
Pulse Gen Serial Number: 111025604

## 2021-03-20 ENCOUNTER — Other Ambulatory Visit (HOSPITAL_COMMUNITY): Payer: Self-pay | Admitting: *Deleted

## 2021-03-20 ENCOUNTER — Telehealth (HOSPITAL_COMMUNITY): Payer: Self-pay | Admitting: Pharmacy Technician

## 2021-03-20 MED ORDER — EMPAGLIFLOZIN 10 MG PO TABS: 10.0000 mg | ORAL_TABLET | Freq: Every day | ORAL | Status: DC

## 2021-03-20 NOTE — Telephone Encounter (Signed)
Advanced Heart Failure Patient Advocate Encounter  Patient was approved to receive Jardiance from [facility] Cares  Patient ID: [id number] Effective dates: 03/20/21 through 08/15/21  Patient would need to call and set up a refill each time or fill out and send in the green slip that comes with refill shipment. The representative was able to set up the first shipment with me. London Pepper will ship out on Monday 8/8, should received the medication in 5-7 business days. Sent Jasmine, (CMA) a request to update the patient's medication list.   Called and left the patient a message regarding approval. Start Jardiance the day after [place] tablets are finished.   Archer Asa, CPhT

## 2021-03-23 ENCOUNTER — Other Ambulatory Visit (HOSPITAL_COMMUNITY): Payer: Self-pay

## 2021-03-23 ENCOUNTER — Telehealth (HOSPITAL_COMMUNITY): Payer: Self-pay | Admitting: Pharmacy Technician

## 2021-03-23 NOTE — Telephone Encounter (Signed)
Advanced Heart Failure Patient Advocate Encounter  Received a call from the patient. The pharmacy told him he could not refill the Metoprolol XL at this time. Looks like RX was sent on 08/01. Upon investigation, its a refill too soon on the patient's insurance until 08/10.  Advised patient to call pharmacy on Wednesday to request a refill.   Archer Asa, CPhT

## 2021-04-13 NOTE — Progress Notes (Signed)
Remote ICD transmission.   

## 2021-04-22 ENCOUNTER — Encounter (HOSPITAL_COMMUNITY): Payer: Self-pay | Admitting: Cardiology

## 2021-04-22 ENCOUNTER — Ambulatory Visit (HOSPITAL_COMMUNITY)
Admission: RE | Admit: 2021-04-22 | Discharge: 2021-04-22 | Disposition: A | Discharge status: Home / Self Care | Payer: BC Managed Care – PPO | Source: Ambulatory Visit | Attending: Internal Medicine | Admitting: Internal Medicine

## 2021-04-22 ENCOUNTER — Ambulatory Visit (HOSPITAL_COMMUNITY): Admission: RE | Admit: 2021-04-22 | Payer: BC Managed Care – PPO | Source: Ambulatory Visit | Admitting: Cardiology

## 2021-04-22 ENCOUNTER — Other Ambulatory Visit: Payer: Self-pay

## 2021-04-22 ENCOUNTER — Ambulatory Visit (HOSPITAL_BASED_OUTPATIENT_CLINIC_OR_DEPARTMENT_OTHER)
Admission: RE | Admit: 2021-04-22 | Discharge: 2021-04-22 | Disposition: A | Discharge status: Home / Self Care | Payer: BC Managed Care – PPO | Source: Ambulatory Visit | Attending: Cardiology | Admitting: Cardiology

## 2021-04-22 VITALS — BP 92/60 | HR 80 | Wt 260.8 lb

## 2021-04-22 DIAGNOSIS — G4733 Obstructive sleep apnea (adult) (pediatric): Secondary | ICD-10-CM | POA: Insufficient documentation

## 2021-04-22 DIAGNOSIS — Z87891 Personal history of nicotine dependence: Secondary | ICD-10-CM | POA: Diagnosis not present

## 2021-04-22 DIAGNOSIS — I255 Ischemic cardiomyopathy: Secondary | ICD-10-CM | POA: Diagnosis not present

## 2021-04-22 DIAGNOSIS — I959 Hypotension, unspecified: Secondary | ICD-10-CM | POA: Diagnosis not present

## 2021-04-22 DIAGNOSIS — Z7901 Long term (current) use of anticoagulants: Secondary | ICD-10-CM | POA: Diagnosis not present

## 2021-04-22 DIAGNOSIS — I5022 Chronic systolic (congestive) heart failure: Secondary | ICD-10-CM

## 2021-04-22 DIAGNOSIS — F419 Anxiety disorder, unspecified: Secondary | ICD-10-CM | POA: Diagnosis not present

## 2021-04-22 DIAGNOSIS — E785 Hyperlipidemia, unspecified: Secondary | ICD-10-CM | POA: Diagnosis not present

## 2021-04-22 DIAGNOSIS — Z79899 Other long term (current) drug therapy: Secondary | ICD-10-CM | POA: Diagnosis not present

## 2021-04-22 DIAGNOSIS — I2510 Atherosclerotic heart disease of native coronary artery without angina pectoris: Secondary | ICD-10-CM | POA: Insufficient documentation

## 2021-04-22 DIAGNOSIS — R0602 Shortness of breath: Secondary | ICD-10-CM | POA: Diagnosis not present

## 2021-04-22 DIAGNOSIS — I480 Paroxysmal atrial fibrillation: Secondary | ICD-10-CM | POA: Diagnosis not present

## 2021-04-22 DIAGNOSIS — Z7982 Long term (current) use of aspirin: Secondary | ICD-10-CM | POA: Insufficient documentation

## 2021-04-22 DIAGNOSIS — F32A Depression, unspecified: Secondary | ICD-10-CM | POA: Insufficient documentation

## 2021-04-22 DIAGNOSIS — I110 Hypertensive heart disease with heart failure: Secondary | ICD-10-CM | POA: Insufficient documentation

## 2021-04-22 LAB — COMPREHENSIVE METABOLIC PANEL
ALT: 18 U/L (ref 0–44)
AST: 20 U/L (ref 15–41)
Albumin: 4.4 g/dL (ref 3.5–5.0)
Alkaline Phosphatase: 55 U/L (ref 38–126)
Anion gap: 10 (ref 5–15)
BUN: 16 mg/dL (ref 8–23)
CO2: 26 mmol/L (ref 22–32)
Calcium: 9.5 mg/dL (ref 8.9–10.3)
Chloride: 98 mmol/L (ref 98–111)
Creatinine, Ser: 0.96 mg/dL (ref 0.61–1.24)
GFR, Estimated: >60 mL/min (ref >60)
Glucose, Bld: 103 mg/dL — ABNORMAL HIGH (ref 70–99)
Potassium: 4.2 mmol/L (ref 3.5–5.1)
Sodium: 134 mmol/L — ABNORMAL LOW (ref 135–145)
Total Bilirubin: 0.7 mg/dL (ref 0.3–1.2)
Total Protein: 7.2 g/dL (ref 6.5–8.1)

## 2021-04-22 LAB — LIPID PANEL
Cholesterol: 133 mg/dL (ref 0–200)
HDL: 43 mg/dL (ref >40)
LDL Cholesterol: 59 mg/dL (ref 0–99)
Total CHOL/HDL Ratio: 3.1 RATIO
Triglycerides: 153 mg/dL — ABNORMAL HIGH (ref <150)
VLDL: 31 mg/dL (ref 0–40)

## 2021-04-22 LAB — ECHOCARDIOGRAM COMPLETE
Area-P 1/2: 2.42 cm2
Calc EF: 42.9 %
S' Lateral: 3.6 cm
Single Plane A2C EF: 40.2 %
Single Plane A4C EF: 46.4 %

## 2021-04-22 LAB — TSH: TSH: 1.924 u[IU]/mL (ref 0.350–4.500)

## 2021-04-22 MED ORDER — SERTRALINE HCL 25 MG PO TABS: 25.0000 mg | ORAL_TABLET | Freq: Every day | ORAL | 0 refills | Status: DC

## 2021-04-22 MED ORDER — PERFLUTREN LIPID MICROSPHERE
1.0000 mL | INTRAVENOUS | Status: DC | PRN
  Administered 2021-04-22: 3 mL via INTRAVENOUS
  Filled 2021-04-22: qty 10

## 2021-04-22 NOTE — Progress Notes (Signed)
PCP: [name], DO Cardiology: Dr. Mayford Knife HF Cardiology: Dr. Shirlee Latch  65 y.o. with history of CAD, PAF, and ischemic cardiomyopathy was referred by Dr. Mayford Knife for evaluation of CHF.  Patent was admitted to [facility] in 3/21 with CHF and atrial fibrillation/RVR. Echo showed EF 20-25%.  LHC was done, showing occluded mid LAD with collaterals.  Cardiac MRI showed EF 22%, apical aneurysm, no significant LAD territory viability; therefore, patient did not have CABG or PCI. Patient was additionally found to have right MCA aneurysm.  This was treated with embolization in 4/21.  In 5/21, he had DCCV back to NSR.  He has remained in NSR on amiodarone.  Repeat echo in 6/21 showed EF 20-25% with moderately decreased RV systolic function.  St Jude ICD was placed.  Patient has also been diagnosed with OSA and has started CPAP.   CPX 11/21 with no clear HF limitation.   Echo was done today and reviewed, EF 35-40% with peri-apical akinesis and apical inferior aneurysm, no LV thrombus noted, mildly decreased RV systolic function, normal IVC.   Patient returns for followup of CHF.  Weight is up 6 lbs.  He has had a tough few weeks, 2 dogs and a cat died.  His brother has been very sick.  All this has led to a lot of stress and likely some situational depression.  He fatigues easily.  He gets short of breath/tired with moderate activity like weed-eating.  He can walk about 100 feet on flat ground before tiring. No chest pain.  He is using CPAP.     ECG (personally reviewed): NSR, LAFB, RBBB, old ASMI  Labs (9/21): K 4.9, creatinine 0.93 Labs (10/21): LDL 68, HDL 49, LFTs normal, TSH normal Labs (11/21): K 4.2, creatinine 0.95 Labs (12/21): K 4.3, creatinine 1.15 Labs (3/22): K 4.3, creatinine 1.12, LFTs normal, TSH normal, hgb 16.5 Labs (6/22): K 4.4, creatinine 1.13, LFTs normal, TSH normal  PMH: 1. Cerebral aneurysm: Right MCA aneurysm, embolized in 4/21 by IR.  2. Atrial fibrillation: Paroxysmal.  - DCCV  5/21 3. CAD: LHC (3/21) with totally occluded mid LAD with collaterals.  4. Chronic systolic CHF: Ischemic cardiomyopathy.   - Cardiac MRI (5/21): EF 22%, apical aneurysm, LAD territory scarring without evidence for significant viability. RV EF 31%.  - Echo (6/21): EF 20-25%, moderately decreased RV systolic function.  - St Jude ICD.  - CPX (11/21): peak VO2 17.9 (81% predicted), VE/VCO2 slope 25, RER 1.17 => no clear HF limitation.  - Echo (9/22): EF 35-40% with peri-apical akinesis and apical inferior aneurysm, no LV thrombus noted, mildly decreased RV systolic function, normal IVC.  5. Gout 6. OSA: Using CPAP.  7. Osteoarthritis 8. Depression/anxiety  Social History   Socioeconomic History   Marital status: Married    Spouse name: Not on file   Number of children: Not on file   Years of education: Not on file   Highest education level: Some college, no degree  Occupational History   Not on file  Tobacco Use   Smoking status: Former    Packs/day: 0.25    Years: 3.00    Pack years: 0.75    Types: Cigarettes    Quit date: 08/17/2011    Years since quitting: 9.6   Smokeless tobacco: Never  Vaping Use   Vaping Use: Never used  Substance and Sexual Activity   Alcohol use: Yes    Alcohol/week: 1.0 standard drink    Types: 1 Glasses of wine per week  Comment: occasional wine   Drug use: No   Sexual activity: Yes  Other Topics Concern   Not on file  Social History Narrative   Not on file   Social Determinants of Health   Financial Resource Strain: Not on file  Food Insecurity: Not on file  Transportation Needs: Not on file  Physical Activity: Not on file  Stress: Not on file  Social Connections: Not on file  Intimate Partner Violence: Not on file   FH: No premature CAD or CHF.   ROS: All systems reviewed and negative except as per HPI.   Current Outpatient Medications  Medication Sig Dispense Refill   amiodarone (PACERONE) 200 MG tablet Take 0.5 tablets (100 mg  total) by mouth daily. 15 tablet 11   apixaban (ELIQUIS) 5 MG TABS tablet TAKE 1 TABLET (5 MG TOTAL) BY MOUTH 2 TIMES DAILY. 180 tablet 3   aspirin EC 81 MG tablet Take 81 mg by mouth daily.     colchicine 0.6 MG tablet Take 0.6 mg by mouth 2 (two) times daily as needed (for gout flares).      diclofenac sodium (VOLTAREN) 1 % GEL Apply 2 g topically 2 (two) times daily as needed (to affected sites- for arthritis pain).      empagliflozin (JARDIANCE) 10 MG TABS tablet Take by mouth daily.     furosemide (LASIX) 40 MG tablet TAKE 1 TABLET(40 MG) BY MOUTH DAILY 90 tablet 3   metoprolol succinate (TOPROL XL) 100 MG 24 hr tablet Take 1 tablet (100 mg total) by mouth daily. 90 tablet 3   mupirocin ointment (BACTROBAN) 2 % Apply 1 application topically 2 (two) times daily.     oxyCODONE-acetaminophen (PERCOCET/ROXICET) 5-325 MG tablet Take 1 tablet by mouth in the morning, at noon, and at bedtime.     rosuvastatin (CRESTOR) 5 MG tablet TAKE 1 TABLET(5 MG) BY MOUTH DAILY 90 tablet 3   sacubitril-valsartan (ENTRESTO) 49-51 MG TAKE 1 TABLET BY MOUTH 2 TIMES DAILY. 180 tablet 3   sertraline (ZOLOFT) 25 MG tablet Take 1 tablet (25 mg total) by mouth at bedtime. 30 tablet 0   spironolactone (ALDACTONE) 25 MG tablet Take 1 tablet (25 mg total) by mouth daily. 90 tablet 3   No current facility-administered medications for this encounter.   BP 92/60   Pulse 80   Wt 118.3 kg (260 lb 12.8 oz)   SpO2 96%   BMI 36.63 kg/m  General: NAD Neck: Thick. No JVD, no thyromegaly or thyroid nodule.  Lungs: Clear to auscultation bilaterally with normal respiratory effort. CV: Nondisplaced PMI.  Heart regular S1/S2, no S3/S4, no murmur.  No peripheral edema.  No carotid bruit.  Normal pedal pulses.  Abdomen: Soft, nontender, no hepatosplenomegaly, no distention.  Skin: Intact without lesions or rashes.  Neurologic: Alert and oriented x 3.  Psych: Normal affect. Extremities: No clubbing or cyanosis.  HEENT: Normal.    Assessment/Plan: 1. CAD: LHC in 3/21 with occluded mid LAD with collaterals.  Cardiac MRI was not suggestive of LAD territory viability, there was an apical aneurysm but no thrombus.  Patient was managed medically. No chest pain.  - He is on Eliquis for atrial fibrillation and also ASA 81 for cerebral aneurysm embolization.  - Continue Crestor, check lipids today.  2. Atrial fibrillation: Paroxysmal, s/p DCCV to NSR in 5/21. He has been maintained in NSR on amiodarone.  NSR today.  - Continue amiodarone 100 mg daily.  Check LFTs and TSH today.  Will  need regular eye exam.   - Continue Eliquis. - He discussed atrial fibrillation ablation with Dr. Ladona Ridgel, who recommended weight loss before considering.  3. Chronic systolic CHF: Ischemic cardiomyopathy.  Echo in 6/21 with EF 20-25%, moderately decreased RV systolic function.  St Jude ICD, not CRT candidate.  CPX in 11/21 with minimal HF limitation. Echo today showed EF 35-40%, apical inferior aneurysm.  He is not volume overloaded on exam.  However, he continues to have significant exertional fatigue and dyspnea, NYHA class III symptoms. Low BP today, no room to titrate up meds.  - Continue Entresto 49/51 bid.  - Continue Lasix 40 mg daily.  - Continue spironolactone 25 mg daily. BMET today.  - Continue Farxiga 10 mg daily.   - Continue Toprol XL 100 mg daily.   - Echo at followup in 3 months.  4. Right MCA aneurysm: S/p embolization.  - Following with IR, still on ASA 81 in addition to Eliquis.  Stop ASA when ok with neuro IR => he will ask IR if he can stop now given Eliquis use.   5. OSA: Using CPAP.  6. Depression/anxiety: Suspect situation depression with recent stressors.   - Trial of sertraline 25 mg daily.   Followup in 3 months with APP.  He does not think he can work at this time given ongoing significant fatigue, I agree with him about this.   Marca Ancona 04/22/2021

## 2021-04-22 NOTE — Patient Instructions (Signed)
Labs done today, we will call you for abnormal results  Your physician recommends that you schedule a follow-up appointment in: 3 months  If you have any questions or concerns before your next appointment please send us a message through mychart or call our office at [phone].    TO LEAVE A MESSAGE FOR THE NURSE SELECT OPTION 2, PLEASE LEAVE A MESSAGE INCLUDING: YOUR NAME DATE OF BIRTH CALL BACK NUMBER REASON FOR CALL**this is important as we prioritize the call backs  YOU WILL RECEIVE A CALL BACK THE SAME DAY AS LONG AS YOU CALL BEFORE 4:00 PM  At the [facility], you and your health needs are our priority. As part of our continuing mission to provide you with exceptional heart care, we have created designated Provider Care Teams. These Care Teams include your primary Cardiologist (physician) and Advanced Practice Providers (APPs- Physician Assistants and Nurse Practitioners) who all work together to provide you with the care you need, when you need it.   You may see any of the following providers on your designated Care Team at your next follow up: Dr Daniel Bensimhon Dr Dalton McLean Dr Brandon Winfrey Amy Clegg, NP Brittainy Simmons, PA Jessica Milford,NP Lauren Kemp, PharmD   Please be sure to bring in all your medications bottles to every appointment.    

## 2021-04-24 DIAGNOSIS — E785 Hyperlipidemia, unspecified: Secondary | ICD-10-CM | POA: Diagnosis not present

## 2021-04-24 DIAGNOSIS — M109 Gout, unspecified: Secondary | ICD-10-CM | POA: Diagnosis not present

## 2021-04-24 DIAGNOSIS — Z125 Encounter for screening for malignant neoplasm of prostate: Secondary | ICD-10-CM | POA: Diagnosis not present

## 2021-04-27 ENCOUNTER — Telehealth (HOSPITAL_COMMUNITY): Payer: Self-pay | Admitting: *Deleted

## 2021-04-27 NOTE — Telephone Encounter (Signed)
Disability placard application left at front desk for pt to pick up. Pt aware.

## 2021-05-12 ENCOUNTER — Telehealth (HOSPITAL_COMMUNITY): Payer: Self-pay

## 2021-05-12 NOTE — Telephone Encounter (Signed)
Pt called to find out if he can come off of Aspirin. PA discussed with Dr. Corliss Skains. Pt will need to have his f/u angiogram before determining if he can d/c or not. He does not want to do an MR or CT at this time because the device will give off too artifact. Pt stated that he doesn't really want to have an angiogram. He will discuss with his wife and call back if he decides to schedule. AW

## 2021-05-12 NOTE — Telephone Encounter (Signed)
Returned pt's call, no answer, left vm. AW  

## 2021-05-14 DIAGNOSIS — D696 Thrombocytopenia, unspecified: Secondary | ICD-10-CM | POA: Diagnosis not present

## 2021-05-14 DIAGNOSIS — Z0000 Encounter for general adult medical examination without abnormal findings: Secondary | ICD-10-CM | POA: Diagnosis not present

## 2021-05-14 DIAGNOSIS — Z1331 Encounter for screening for depression: Secondary | ICD-10-CM | POA: Diagnosis not present

## 2021-05-14 DIAGNOSIS — I4819 Other persistent atrial fibrillation: Secondary | ICD-10-CM | POA: Diagnosis not present

## 2021-05-14 DIAGNOSIS — Z7251 High risk heterosexual behavior: Secondary | ICD-10-CM | POA: Diagnosis not present

## 2021-05-14 DIAGNOSIS — Z1339 Encounter for screening examination for other mental health and behavioral disorders: Secondary | ICD-10-CM | POA: Diagnosis not present

## 2021-05-19 ENCOUNTER — Other Ambulatory Visit (HOSPITAL_COMMUNITY): Payer: Self-pay | Admitting: Cardiology

## 2021-05-20 ENCOUNTER — Other Ambulatory Visit: Payer: Self-pay | Admitting: Cardiology

## 2021-05-21 ENCOUNTER — Other Ambulatory Visit (HOSPITAL_COMMUNITY): Payer: Self-pay | Admitting: Cardiology

## 2021-06-08 ENCOUNTER — Other Ambulatory Visit (HOSPITAL_COMMUNITY): Payer: Self-pay

## 2021-06-08 MED ORDER — AMIODARONE HCL 200 MG PO TABS: 100.0000 mg | ORAL_TABLET | Freq: Every day | ORAL | 11 refills | Status: DC

## 2021-06-18 ENCOUNTER — Ambulatory Visit (INDEPENDENT_AMBULATORY_CARE_PROVIDER_SITE_OTHER): Payer: BC Managed Care – PPO

## 2021-06-18 DIAGNOSIS — I255 Ischemic cardiomyopathy: Secondary | ICD-10-CM

## 2021-06-18 LAB — CUP PACEART REMOTE DEVICE CHECK
Battery Remaining Longevity: 106 mo
Battery Remaining Percentage: 87 %
Battery Voltage: 2.99 V
Brady Statistic RV Percent Paced: 1 %
Date Time Interrogation Session: 20221103020055
HighPow Impedance: 79 Ohm
Implantable Lead Implant Date: 20210805
Implantable Lead Location: 753860
Implantable Pulse Generator Implant Date: 20210805
Lead Channel Impedance Value: 360 Ohm
Lead Channel Pacing Threshold Amplitude: 0.75 V
Lead Channel Pacing Threshold Pulse Width: 0.5 ms
Lead Channel Sensing Intrinsic Amplitude: 11.7 mV
Lead Channel Setting Pacing Amplitude: 2.5 V
Lead Channel Setting Pacing Pulse Width: 0.5 ms
Lead Channel Setting Sensing Sensitivity: 0.5 mV
Pulse Gen Serial Number: 111025604

## 2021-06-24 NOTE — Progress Notes (Signed)
Remote ICD transmission.   

## 2021-07-16 DIAGNOSIS — G4733 Obstructive sleep apnea (adult) (pediatric): Secondary | ICD-10-CM | POA: Diagnosis not present

## 2021-07-21 NOTE — Progress Notes (Addendum)
PCP: Sueanne Margarita, DO Cardiology: Dr. Radford Pax HF Cardiology: Dr. Aundra Dubin  65 y.o. with history of CAD, PAF, and ischemic cardiomyopathy was referred by Dr. Radford Pax for evaluation of CHF.  Patent was admitted to [facility] in 3/21 with CHF and atrial fibrillation/RVR. Echo showed EF 20-25%.  LHC was done, showing occluded mid LAD with collaterals.  Cardiac MRI showed EF 22%, apical aneurysm, no significant LAD territory viability; therefore, patient did not have CABG or PCI. Patient was additionally found to have right MCA aneurysm.  This was treated with embolization in 4/21.  In 5/21, he had DCCV back to NSR.  He has remained in NSR on amiodarone.  Repeat echo in 6/21 showed EF 20-25% with moderately decreased RV systolic function.  St Jude ICD was placed.  Patient has also been diagnosed with OSA and has started CPAP.   CPX 11/21 with no clear HF limitation.   Echo 9/22 EF 35-40% with peri-apical akinesis and apical inferior aneurysm, no LV thrombus noted, mildly decreased RV systolic function, normal IVC.   Today he returns for HF follow up. Continues to struggle with fatigue and poor energy, as well as chronic pain in hands and back. He has SOB with moderate activity. Denies abnormal bleeding, palpitations, CP, dizziness, edema, or PND/Orthopnea. Appetite ok. No fever or chills. Weight at home 255-260 pounds. Taking all medications. Wears CPAP nightly. Worked as a Engineer, production previously, working on disability now. ETOH 1-2/day. Drinking a lot of Gatorade. Stopped sertraline after a couple weeks as he was concerned about cardiac arrhythmias. Has lot of life stressors.  ECG (personally reviewed): NSR, RBBB, old anterior MI  Abbot device: interrogation from 06/18/21 showed recent tachyarrhythmias, non-sustained, no shocks.  Labs (9/21): K 4.9, creatinine 0.93 Labs (10/21): LDL 68, HDL 49, LFTs normal, TSH normal Labs (11/21): K 4.2, creatinine 0.95 Labs (12/21): K 4.3, creatinine 1.15 Labs  (3/22): K 4.3, creatinine 1.12, LFTs normal, TSH normal, hgb 16.5 Labs (6/22): K 4.4, creatinine 1.13, LFTs normal, TSH normal Labs (9/22): K 4.2, creatinine 0.96  PMH: 1. Cerebral aneurysm: Right MCA aneurysm, embolized in 4/21 by IR.  2. Atrial fibrillation: Paroxysmal.  - DCCV 5/21 3. CAD: LHC (3/21) with totally occluded mid LAD with collaterals.  4. Chronic systolic CHF: Ischemic cardiomyopathy.   - Cardiac MRI (5/21): EF 22%, apical aneurysm, LAD territory scarring without evidence for significant viability. RV EF 31%.  - Echo (6/21): EF 20-25%, moderately decreased RV systolic function.  - St Jude ICD.  - CPX (11/21): peak VO2 17.9 (81% predicted), VE/VCO2 slope 25, RER 1.17 => no clear HF limitation.  - Echo (9/22): EF 35-40% with peri-apical akinesis and apical inferior aneurysm, no LV thrombus noted, mildly decreased RV systolic function, normal IVC.  5. Gout 6. OSA: Using CPAP.  7. Osteoarthritis 8. Depression/anxiety  Social History   Socioeconomic History   Marital status: Married    Spouse name: Not on file   Number of children: Not on file   Years of education: Not on file   Highest education level: Some college, no degree  Occupational History   Not on file  Tobacco Use   Smoking status: Former    Packs/day: 0.25    Years: 3.00    Pack years: 0.75    Types: Cigarettes    Quit date: 08/17/2011    Years since quitting: 9.9   Smokeless tobacco: Never  Vaping Use   Vaping Use: Never used  Substance and Sexual Activity   Alcohol use:  Yes    Alcohol/week: 1.0 standard drink    Types: 1 Glasses of wine per week    Comment: occasional wine   Drug use: No   Sexual activity: Yes  Other Topics Concern   Not on file  Social History Narrative   Not on file   Social Determinants of Health   Financial Resource Strain: Not on file  Food Insecurity: Not on file  Transportation Needs: Not on file  Physical Activity: Not on file  Stress: Not on file  Social  Connections: Not on file  Intimate Partner Violence: Not on file   FH: No premature CAD or CHF.   ROS: All systems reviewed and negative except as per HPI.   Current Outpatient Medications  Medication Sig Dispense Refill   amiodarone (PACERONE) 200 MG tablet Take 0.5 tablets (100 mg total) by mouth daily. 15 tablet 11   apixaban (ELIQUIS) 5 MG TABS tablet TAKE 1 TABLET (5 MG TOTAL) BY MOUTH 2 TIMES DAILY. 180 tablet 3   aspirin EC 81 MG tablet Take 81 mg by mouth daily.     colchicine 0.6 MG tablet Take 0.6 mg by mouth 2 (two) times daily as needed (for gout flares).      diclofenac sodium (VOLTAREN) 1 % GEL Apply 2 g topically 2 (two) times daily as needed (to affected sites- for arthritis pain).      empagliflozin (JARDIANCE) 10 MG TABS tablet Take by mouth daily.     furosemide (LASIX) 40 MG tablet TAKE 1 TABLET(40 MG) BY MOUTH DAILY 90 tablet 3   metoprolol succinate (TOPROL XL) 100 MG 24 hr tablet Take 1 tablet (100 mg total) by mouth daily. 90 tablet 3   mupirocin ointment (BACTROBAN) 2 % Apply 1 application topically 2 (two) times daily.     oxyCODONE-acetaminophen (PERCOCET/ROXICET) 5-325 MG tablet Take 1 tablet by mouth in the morning, at noon, and at bedtime.     rosuvastatin (CRESTOR) 5 MG tablet TAKE 1 TABLET(5 MG) BY MOUTH DAILY 90 tablet 3   sacubitril-valsartan (ENTRESTO) 49-51 MG TAKE 1 TABLET BY MOUTH 2 TIMES DAILY. 180 tablet 3   sertraline (ZOLOFT) 25 MG tablet TAKE 1 TABLET(25 MG) BY MOUTH AT BEDTIME 30 tablet 5   spironolactone (ALDACTONE) 25 MG tablet Take 1 tablet (25 mg total) by mouth daily. 90 tablet 3   No current facility-administered medications for this encounter.   Wt Readings from Last 3 Encounters:  07/22/21 118.4 kg (261 lb)  04/22/21 118.3 kg (260 lb 12.8 oz)  01/16/21 115.2 kg (254 lb)   BP 100/62   Pulse 80   Wt 118.4 kg (261 lb)   SpO2 96%   BMI 36.66 kg/m  ReDs: 41% General:  NAD. No resp difficulty, mildly anxious HEENT: Normal Neck:  Supple. Thick neck, JVP 8-9. Carotids 2+ bilat; no bruits. No lymphadenopathy or thryomegaly appreciated. Cor: PMI nondisplaced. Regular rate & rhythm. No rubs, gallops or murmurs. Lungs: Clear Abdomen: Obese, nontender, nondistended. No hepatosplenomegaly. No bruits or masses. Good bowel sounds. Extremities: No cyanosis, clubbing, rash, edema Neuro: Alert & oriented x 3, cranial nerves grossly intact. Moves all 4 extremities w/o difficulty. Anxious  Assessment/Plan: 1. CAD: LHC in 3/21 with occluded mid LAD with collaterals.  Cardiac MRI was not suggestive of LAD territory viability, there was an apical aneurysm but no thrombus.  Patient was managed medically. No chest pain.  - He is on Eliquis for atrial fibrillation and also ASA 81 for cerebral aneurysm embolization.  -  Continue Crestor, lipids ok 9/22 2. Atrial fibrillation: Paroxysmal, s/p DCCV to NSR in 5/21. He has been maintained in NSR on amiodarone.  NSR today. Device interrogation shows non-sustained tachy arrhythmias. He is asymptomatic with these. - Continue amiodarone 100 mg daily.  LFTs and TSH 9/22 ok.  Will need regular eye exam.   - Continue Eliquis. - He discussed atrial fibrillation ablation with Dr. Ladona Ridgel, who recommended weight loss before considering. He is anxious about his heart rhythm. Long discussion today about his current medical treatment. Needs to follow back up with EP, discussed with Dr. Shirlee Latch.  - Discussed cutting down on ETOH + weight loss (see #7). 3. Chronic systolic CHF: Ischemic cardiomyopathy.  Echo in 6/21 with EF 20-25%, moderately decreased RV systolic function.  St Jude ICD, not CRT candidate.  CPX in 11/21 with minimal HF limitation. Echo 9/22 showed EF 35-40%, apical inferior aneurysm.  He is mildly volume overloaded on exam, ReDs 41%.  However, he continues to have significant exertional fatigue and dyspnea, NYHA class III symptoms, functional class limited in part due to physical deconditioning and  generalized pain. Low BP today, no room to titrate up meds.  - Increase Lasix to 60 mg daily. BMET today, repeat in 10 days. - Continue Entresto 49/51 mg bid. No BP room to titrate today. - Continue spironolactone 25 mg daily.  - Continue Jardiance 10 mg daily.   - Continue Toprol XL 100 mg daily.   - Repeat Echo. 4. Right MCA aneurysm: S/p embolization.  - Following with IR, still on ASA 81 in addition to Eliquis.  Stop ASA when ok with neuro IR => he will ask IR if he can stop given Eliquis use.   5. OSA: Using CPAP.  6. Depression/anxiety: Suspect situation depression/anxiety with recent stressors.   - Advised re-starting sertraline. He stopped after a couple weeks, worried about cardiac arrhythmias. Will need to follow up with PCP for further dose increases.  7. Obesity: Body mass index is 36.66 kg/m. - Needs weight loss.  - Consider referral to pharmacy for semaglutide. I did not discuss this with him today as he was anxious and over-whelmed with his current health issues and recent life stressors.   Followup in 3 months with Dr. Shirlee Latch.  He does not think he can work at this time given ongoing significant fatigue, I agree with him about this. Disability paperwork copied and will give to [facility].  Anderson Malta [facility] FNP 07/22/2021  Greater than 50% of the (total minutes 40) visit spent in counseling/coordination of care regarding (discussion of medications, side effects of medications, life stressors causing current anxiety, therapies for atrial fibrillation should it recur, and our office's disability process).

## 2021-07-22 ENCOUNTER — Ambulatory Visit (HOSPITAL_COMMUNITY)
Admission: RE | Admit: 2021-07-22 | Discharge: 2021-07-22 | Disposition: A | Discharge status: Home / Self Care | Payer: BC Managed Care – PPO | Source: Ambulatory Visit | Attending: Cardiology | Admitting: Cardiology

## 2021-07-22 ENCOUNTER — Encounter (HOSPITAL_COMMUNITY): Payer: Self-pay

## 2021-07-22 ENCOUNTER — Other Ambulatory Visit: Payer: Self-pay

## 2021-07-22 VITALS — BP 100/62 | HR 80 | Wt 261.0 lb

## 2021-07-22 DIAGNOSIS — I480 Paroxysmal atrial fibrillation: Secondary | ICD-10-CM | POA: Diagnosis not present

## 2021-07-22 DIAGNOSIS — Z87891 Personal history of nicotine dependence: Secondary | ICD-10-CM | POA: Insufficient documentation

## 2021-07-22 DIAGNOSIS — Z6836 Body mass index (BMI) 36.0-36.9, adult: Secondary | ICD-10-CM | POA: Insufficient documentation

## 2021-07-22 DIAGNOSIS — G4733 Obstructive sleep apnea (adult) (pediatric): Secondary | ICD-10-CM | POA: Diagnosis not present

## 2021-07-22 DIAGNOSIS — Z7901 Long term (current) use of anticoagulants: Secondary | ICD-10-CM | POA: Insufficient documentation

## 2021-07-22 DIAGNOSIS — I5022 Chronic systolic (congestive) heart failure: Secondary | ICD-10-CM | POA: Insufficient documentation

## 2021-07-22 DIAGNOSIS — Z7984 Long term (current) use of oral hypoglycemic drugs: Secondary | ICD-10-CM | POA: Insufficient documentation

## 2021-07-22 DIAGNOSIS — I255 Ischemic cardiomyopathy: Secondary | ICD-10-CM | POA: Insufficient documentation

## 2021-07-22 DIAGNOSIS — E669 Obesity, unspecified: Secondary | ICD-10-CM | POA: Diagnosis not present

## 2021-07-22 DIAGNOSIS — I2510 Atherosclerotic heart disease of native coronary artery without angina pectoris: Secondary | ICD-10-CM | POA: Diagnosis not present

## 2021-07-22 DIAGNOSIS — I2583 Coronary atherosclerosis due to lipid rich plaque: Secondary | ICD-10-CM

## 2021-07-22 DIAGNOSIS — I671 Cerebral aneurysm, nonruptured: Secondary | ICD-10-CM | POA: Diagnosis not present

## 2021-07-22 DIAGNOSIS — F32A Depression, unspecified: Secondary | ICD-10-CM | POA: Diagnosis not present

## 2021-07-22 DIAGNOSIS — F419 Anxiety disorder, unspecified: Secondary | ICD-10-CM | POA: Insufficient documentation

## 2021-07-22 DIAGNOSIS — Z7982 Long term (current) use of aspirin: Secondary | ICD-10-CM | POA: Insufficient documentation

## 2021-07-22 DIAGNOSIS — Z79899 Other long term (current) drug therapy: Secondary | ICD-10-CM | POA: Insufficient documentation

## 2021-07-22 DIAGNOSIS — Z95810 Presence of automatic (implantable) cardiac defibrillator: Secondary | ICD-10-CM

## 2021-07-22 LAB — BASIC METABOLIC PANEL
Anion gap: 13 (ref 5–15)
BUN: 15 mg/dL (ref 8–23)
CO2: 28 mmol/L (ref 22–32)
Calcium: 9.6 mg/dL (ref 8.9–10.3)
Chloride: 93 mmol/L — ABNORMAL LOW (ref 98–111)
Creatinine, Ser: 0.98 mg/dL (ref 0.61–1.24)
GFR, Estimated: >60 mL/min (ref >60)
Glucose, Bld: 119 mg/dL — ABNORMAL HIGH (ref 70–99)
Potassium: 4.2 mmol/L (ref 3.5–5.1)
Sodium: 134 mmol/L — ABNORMAL LOW (ref 135–145)

## 2021-07-22 LAB — CBC
HCT: 51.1 % (ref 39.0–52.0)
Hemoglobin: 16.5 g/dL (ref 13.0–17.0)
MCH: 30.8 pg (ref 26.0–34.0)
MCHC: 32.3 g/dL (ref 30.0–36.0)
MCV: 95.3 fL (ref 80.0–100.0)
Platelets: 251 10*3/uL (ref 150–400)
RBC: 5.36 MIL/uL (ref 4.22–5.81)
RDW: 14 % (ref 11.5–15.5)
WBC: 9.9 10*3/uL (ref 4.0–10.5)
nRBC: 0 % (ref 0.0–0.2)

## 2021-07-22 MED ORDER — FUROSEMIDE 20 MG PO TABS: 60.0000 mg | ORAL_TABLET | Freq: Every day | ORAL | 4 refills | Status: DC

## 2021-07-22 MED ORDER — SERTRALINE HCL 25 MG PO TABS: 25.0000 mg | ORAL_TABLET | Freq: Every day | ORAL | 3 refills | Status: DC

## 2021-07-22 NOTE — Patient Instructions (Addendum)
Thank you for coming in today  EKG was done  Labs were done, if any labs are abnormal the clinic will all you  RESTART Zoloft 25 mg 1 tablet daily  INCREASE Lasix to 60 mg 3 tablets daily   Your physician recommends that you return for lab work in: 10 days  Your physician recommends that you schedule a follow-up appointment in: 3 months with Dr. Shirlee Latch And you will be scheduled for a echocardiogram   At the [facility], you and your health needs are our priority. As part of our continuing mission to provide you with exceptional heart care, we have created designated Provider Care Teams. These Care Teams include your primary Cardiologist (physician) and Advanced Practice Providers (APPs- Physician Assistants and Nurse Practitioners) who all work together to provide you with the care you need, when you need it.   You may see any of the following providers on your designated Care Team at your next follow up: Dr Arvilla Meres Dr Carron Curie, NP Robbie Lis, [place] [facility] [place], [place] Karle Plumber, PharmD   Please be sure to bring in all your medications bottles to every appointment.    If you have any questions or concerns before your next appointment please send [place] a message through [place] or call our office at [phone].    TO LEAVE A MESSAGE FOR THE NURSE SELECT OPTION 2, PLEASE LEAVE A MESSAGE INCLUDING: YOUR NAME DATE OF BIRTH CALL BACK NUMBER REASON FOR CALL**this is important as we prioritize the call backs  YOU WILL RECEIVE A CALL BACK THE SAME DAY AS LONG AS YOU CALL BEFORE 4:00 PM

## 2021-07-22 NOTE — Progress Notes (Signed)
ReDS Vest / Clip - 07/22/21 1100       ReDS Vest / Clip   Station Marker D    Ruler Value 38    ReDS Value Range High volume overload    ReDS Actual Value 41

## 2021-07-23 ENCOUNTER — Ambulatory Visit (HOSPITAL_COMMUNITY)
Admission: RE | Admit: 2021-07-23 | Discharge: 2021-07-23 | Disposition: A | Discharge status: Home / Self Care | Payer: BC Managed Care – PPO | Source: Ambulatory Visit | Attending: Family Medicine | Admitting: Family Medicine

## 2021-07-23 DIAGNOSIS — I5022 Chronic systolic (congestive) heart failure: Secondary | ICD-10-CM | POA: Diagnosis not present

## 2021-07-23 LAB — ECHOCARDIOGRAM COMPLETE
Area-P 1/2: 3.03 cm2
Calc EF: 38.1 %
S' Lateral: 3.8 cm
Single Plane A2C EF: 33.6 %
Single Plane A4C EF: 40.3 %

## 2021-07-24 ENCOUNTER — Telehealth (HOSPITAL_COMMUNITY): Payer: Self-pay

## 2021-07-24 ENCOUNTER — Telehealth (HOSPITAL_COMMUNITY): Payer: Self-pay | Admitting: Cardiology

## 2021-07-24 NOTE — Telephone Encounter (Signed)
-----   Message from Jacklynn Ganong, [place] sent at 07/24/2021  9:26 AM EST ----- EF 35-40%, unchanged from prior

## 2021-07-24 NOTE — Telephone Encounter (Signed)
Pt aware, agreeable, and verbalized understanding 

## 2021-07-24 NOTE — Telephone Encounter (Signed)
Received a fax requesting medical records from [employer]. Records were successfully faxed to: attn; Pryor Curia [phone] ,which was the number provided.. Medical request form will be scanned into patients chart.

## 2021-07-27 ENCOUNTER — Other Ambulatory Visit (HOSPITAL_COMMUNITY): Payer: Self-pay | Admitting: Cardiology

## 2021-07-31 ENCOUNTER — Other Ambulatory Visit (HOSPITAL_COMMUNITY): Payer: BC Managed Care – PPO

## 2021-08-06 ENCOUNTER — Other Ambulatory Visit (HOSPITAL_COMMUNITY): Payer: BC Managed Care – PPO

## 2021-08-11 ENCOUNTER — Other Ambulatory Visit (HOSPITAL_COMMUNITY): Payer: Self-pay

## 2021-08-11 MED ORDER — SPIRONOLACTONE 25 MG PO TABS: 25.0000 mg | ORAL_TABLET | Freq: Every day | ORAL | 3 refills | Status: DC

## 2021-08-12 ENCOUNTER — Ambulatory Visit (HOSPITAL_COMMUNITY)
Admission: RE | Admit: 2021-08-12 | Discharge: 2021-08-12 | Disposition: A | Discharge status: Home / Self Care | Payer: BC Managed Care – PPO | Source: Ambulatory Visit | Attending: Cardiology | Admitting: Cardiology

## 2021-08-12 ENCOUNTER — Other Ambulatory Visit: Payer: Self-pay

## 2021-08-12 DIAGNOSIS — I5022 Chronic systolic (congestive) heart failure: Secondary | ICD-10-CM | POA: Diagnosis not present

## 2021-08-12 LAB — BASIC METABOLIC PANEL
Anion gap: 10 (ref 5–15)
BUN: 15 mg/dL (ref 8–23)
CO2: 25 mmol/L (ref 22–32)
Calcium: 9.1 mg/dL (ref 8.9–10.3)
Chloride: 98 mmol/L (ref 98–111)
Creatinine, Ser: 1.09 mg/dL (ref 0.61–1.24)
GFR, Estimated: >60 mL/min (ref >60)
Glucose, Bld: 111 mg/dL — ABNORMAL HIGH (ref 70–99)
Potassium: 4.2 mmol/L (ref 3.5–5.1)
Sodium: 133 mmol/L — ABNORMAL LOW (ref 135–145)

## 2021-09-17 ENCOUNTER — Telehealth (HOSPITAL_COMMUNITY): Payer: Self-pay | Admitting: Pharmacy Technician

## 2021-09-17 ENCOUNTER — Telehealth: Payer: Self-pay | Admitting: Internal Medicine

## 2021-09-17 ENCOUNTER — Other Ambulatory Visit (HOSPITAL_COMMUNITY): Payer: Self-pay

## 2021-09-17 ENCOUNTER — Ambulatory Visit (INDEPENDENT_AMBULATORY_CARE_PROVIDER_SITE_OTHER): Payer: BC Managed Care – PPO

## 2021-09-17 DIAGNOSIS — I255 Ischemic cardiomyopathy: Secondary | ICD-10-CM

## 2021-09-17 LAB — CUP PACEART REMOTE DEVICE CHECK
Battery Remaining Longevity: 103 mo
Battery Remaining Percentage: 84 %
Battery Voltage: 2.99 V
Brady Statistic RV Percent Paced: 1 %
Date Time Interrogation Session: 20230202010022
HighPow Impedance: 72 Ohm
Implantable Lead Implant Date: 20210805
Implantable Lead Location: 753860
Implantable Pulse Generator Implant Date: 20210805
Lead Channel Impedance Value: 410 Ohm
Lead Channel Pacing Threshold Amplitude: 0.75 V
Lead Channel Pacing Threshold Pulse Width: 0.5 ms
Lead Channel Sensing Intrinsic Amplitude: 11.7 mV
Lead Channel Setting Pacing Amplitude: 2.5 V
Lead Channel Setting Pacing Pulse Width: 0.5 ms
Lead Channel Setting Sensing Sensitivity: 0.5 mV
Pulse Gen Serial Number: 111025604

## 2021-09-17 NOTE — Telephone Encounter (Signed)
Advanced Heart Failure Patient Advocate Encounter  Spoke to patient regarding re-enrollment of Jardiance assistance with [employer]. Sent docusign link for the patient to sign.   Will fax in once all signatures are obtained.

## 2021-09-17 NOTE — Telephone Encounter (Signed)
°  1. Has your device fired?   2. Is you device beeping?   3. Are you experiencing draining or swelling at device site?   4. Are you calling to see if we received your device transmission?  Patient is requesting a call back to discuss 02/02 remote transmission.  5. Have you passed out?

## 2021-09-17 NOTE — Telephone Encounter (Signed)
Patient requesting information regarding what "NSVT and CorVue" means patient was concerned that he was in atrial fibrillation informed patient due to him having only a single lead device I could not 100% definitively  say if he was in or out of atrial fibrillation as of 1am this morning when the transmission crossed. Explained to patient that CorVue trending down meant he could be retaining fluid, advised patient to limit his salt intake, and to be more active, patient voiced understanding. Patient is overdue for F/U for GT informed patient that I would send a message to the scheduler to call him back.

## 2021-09-18 NOTE — Telephone Encounter (Signed)
Medication Samples have been provided to the patient.  Drug name: Jardiance       Strength: 10mg         Qty: 4 bottles  LOT:  Exp.Date: 01/25  Dosing instructions: one tablet daily   The patient has been instructed regarding the correct time, dose, and frequency of taking this medication, including desired effects and most common side effects.   2/25 Correll Denbow 1:57 PM 09/18/2021

## 2021-09-18 NOTE — Telephone Encounter (Signed)
Sent in [employer] application via fax.  Will follow up.   

## 2021-09-23 NOTE — Progress Notes (Signed)
Remote ICD transmission.   

## 2021-09-25 ENCOUNTER — Other Ambulatory Visit (HOSPITAL_COMMUNITY): Payer: Self-pay | Admitting: Cardiology

## 2021-09-25 ENCOUNTER — Other Ambulatory Visit (HOSPITAL_COMMUNITY): Payer: Self-pay

## 2021-09-25 MED ORDER — APIXABAN 5 MG PO TABS
ORAL_TABLET | ORAL | 3 refills | Status: DC
  Filled 2021-09-25: qty 180, 90d supply, fill #0
  Filled 2022-01-26: qty 180, 90d supply, fill #1
  Filled 2022-04-21: qty 180, 90d supply, fill #2
  Filled 2022-04-26: qty 180, 90d supply, fill #3
  Filled 2022-07-20: qty 60, 30d supply, fill #3
  Filled 2022-08-19: qty 60, 30d supply, fill #4

## 2021-09-25 MED ORDER — ENTRESTO 49-51 MG PO TABS
1.0000 | ORAL_TABLET | Freq: Two times a day (BID) | ORAL | 3 refills | Status: DC
  Filled 2021-09-25: qty 180, 90d supply, fill #0
  Filled 2022-01-26: qty 180, 90d supply, fill #1
  Filled 2022-04-21: qty 180, 90d supply, fill #2
  Filled 2022-07-20: qty 180, 90d supply, fill #3

## 2021-09-25 NOTE — Telephone Encounter (Signed)
Advanced Heart Failure Patient Advocate Encounter  Received communication from [facility] that the patient has commercial coverage and has been denied. Called and spoke with the patient. I think that this is an incorrect denial and will call [employer] to clarify. The patient has the same insurance as prior years approval.

## 2021-09-28 ENCOUNTER — Other Ambulatory Visit (HOSPITAL_COMMUNITY): Payer: Self-pay

## 2021-09-28 NOTE — Telephone Encounter (Signed)
Advanced Heart Failure Patient Advocate Encounter  Called [employer] and explained the patient has the same commercial insurance and deductible as the prior year. The representative sent the application for reprocessing. Will follow up. Called and left the patient a message.

## 2021-10-02 ENCOUNTER — Other Ambulatory Visit (HOSPITAL_COMMUNITY): Payer: Self-pay

## 2021-10-12 NOTE — Telephone Encounter (Signed)
Advanced Heart Failure Patient Advocate Encounter   Patient was approved to receive Jardiance from [employer]  Effective dates: 10/05/21 through 08/15/22  Called and spoke with the patient. Shipment was sent on 02/24 and would take 5-7 business days.  Archer Asa, CPhT

## 2021-10-19 ENCOUNTER — Other Ambulatory Visit: Payer: Self-pay

## 2021-10-19 ENCOUNTER — Ambulatory Visit (INDEPENDENT_AMBULATORY_CARE_PROVIDER_SITE_OTHER): Payer: BC Managed Care – PPO | Admitting: Internal Medicine

## 2021-10-19 ENCOUNTER — Encounter: Payer: Self-pay | Admitting: Internal Medicine

## 2021-10-19 VITALS — BP 92/60 | HR 72 | Ht 73.0 in | Wt 260.0 lb

## 2021-10-19 DIAGNOSIS — I10 Essential (primary) hypertension: Secondary | ICD-10-CM | POA: Diagnosis not present

## 2021-10-19 DIAGNOSIS — Z95810 Presence of automatic (implantable) cardiac defibrillator: Secondary | ICD-10-CM | POA: Diagnosis not present

## 2021-10-19 DIAGNOSIS — I480 Paroxysmal atrial fibrillation: Secondary | ICD-10-CM

## 2021-10-19 DIAGNOSIS — I5022 Chronic systolic (congestive) heart failure: Secondary | ICD-10-CM | POA: Diagnosis not present

## 2021-10-19 MED ORDER — AMIODARONE HCL 200 MG PO TABS: ORAL_TABLET | ORAL | 3 refills | Status: DC

## 2021-10-19 NOTE — Patient Instructions (Addendum)
Medication Instructions:  ?Your physician has recommended you make the following change in your medication:  ? ? INCREASE your amiodarone 200 mg-  Take 1/2 tablet (100 mg) by mouth daily Monday through Friday.  Take a WHOLE tablet (200 mg) by mouth Saturday and Sunday. ? ?Labwork: ?None ordered. ? ?Testing/Procedures: ?None ordered. ? ?Follow-Up: ?Your physician wants you to follow-up in: one year with Cristopher Peru, MD or one of the following Advanced Practice Providers on your designated Care Team:   ?Tommye Standard, PA-C ?Legrand Como "Jonni Sanger" [place], PA-C ? ?Remote monitoring is used to monitor your ICD from home. This monitoring reduces the number of office visits required to check your device to one time per year. It allows [place] to keep an eye on the functioning of your device to ensure it is working properly. You are scheduled for a device check from home on 12/17/2021. You may send your transmission at any time that day. If you have a wireless device, the transmission will be sent automatically. After your physician reviews your transmission, you will receive a postcard with your next transmission date. ? ?Any Other Special Instructions Will Be Listed Below (If Applicable). ? ?If you need a refill on your cardiac medications before your next appointment, please call your pharmacy.  ? ? ? ? ? ?

## 2021-10-19 NOTE — Progress Notes (Signed)
? ? ? ? ?HPI ?Richard Graham returns today for followup. He is a pleasant 66 yo man with PAF on amiodarone, an ICM, s/p MI, RBBB, chronic systolic heart failure, s/p ICD insertion. He has had his dose of amiodarone reduced to 100 mg daily. His BMI is increased. He denies angina and he has not had any ICD therapies. No chest pain. he admits to drinking extra fluid. His sodium has been low. ?No Known Allergies ? ? ?Current Outpatient Medications  ?Medication Sig Dispense Refill  ? apixaban (ELIQUIS) 5 MG TABS tablet TAKE 1 TABLET (5 MG TOTAL) BY MOUTH 2 TIMES DAILY. 180 tablet 3  ? aspirin EC 81 MG tablet Take 81 mg by mouth daily.    ? colchicine 0.6 MG tablet Take 0.6 mg by mouth 2 (two) times daily as needed (for gout flares).     ? diclofenac sodium (VOLTAREN) 1 % GEL Apply 2 g topically 2 (two) times daily as needed (to affected sites- for arthritis pain).     ? empagliflozin (JARDIANCE) 10 MG TABS tablet Take by mouth daily.    ? furosemide (LASIX) 20 MG tablet Take 3 tablets (60 mg total) by mouth daily. 90 tablet 4  ? metoprolol succinate (TOPROL-XL) 100 MG 24 hr tablet TAKE 1 TABLET(100 MG) BY MOUTH DAILY 30 tablet 6  ? mupirocin ointment (BACTROBAN) 2 % Apply 1 application topically 2 (two) times daily.    ? oxyCODONE-acetaminophen (PERCOCET/ROXICET) 5-325 MG tablet Take 1 tablet by mouth in the morning, at noon, and at bedtime.    ? rosuvastatin (CRESTOR) 5 MG tablet TAKE 1 TABLET(5 MG) BY MOUTH DAILY 90 tablet 3  ? sacubitril-valsartan (ENTRESTO) 49-51 MG TAKE 1 TABLET BY MOUTH 2 TIMES DAILY. 180 tablet 3  ? sertraline (ZOLOFT) 25 MG tablet Take 1 tablet (25 mg total) by mouth daily. 30 tablet 3  ? spironolactone (ALDACTONE) 25 MG tablet Take 1 tablet (25 mg total) by mouth daily. 90 tablet 3  ? amiodarone (PACERONE) 200 MG tablet Take 1/2 tablet by mouth Monday through Friday.  Take a WHOLE tablet by mouth Saturday and Sunday. 60 tablet 3  ? ?No current facility-administered medications for this visit.   ? ? ? ?Past Medical History:  ?Diagnosis Date  ? CAD (coronary artery disease), native coronary artery   ? chronically occluded mid LAD with left to left collaterals.   ? Cerebral aneurysm   ? s/p embolization  ? Chronic systolic (congestive) heart failure (HCC)   ? biventricular HF with moderate RV dysfunction and severe LV dysfunction by Cardiac MRI with EF 22% no viability in the apical septal/apical/inferior,mid anterior and true apex.    ? Gout   ? HLD (hyperlipidemia)   ? PAF (paroxysmal atrial fibrillation) (HCC)   ? s/p DCCV 12/2019  ? Pre-diabetes   ? diet controlled, no meds  ? ? ?ROS: ? ? All systems reviewed and negative except as noted in the HPI. ? ? ?Past Surgical History:  ?Procedure Laterality Date  ? CARDIAC CATHETERIZATION    ? CARDIOVERSION N/A 12/28/2019  ? Procedure: CARDIOVERSION;  Surgeon: Chilton Si, MD;  Location: [facility] ENDOSCOPY;  Service: Cardiovascular;  Laterality: N/A;  ? COLONOSCOPY    ? ICD IMPLANT N/A 03/20/2020  ? Procedure: ICD IMPLANT;  Surgeon: Marinus Maw, MD;  Location: [facility] INVASIVE CV LAB;  Service: Cardiovascular;  Laterality: N/A;  ? INSERTION OF MESH N/A 06/27/2015  ? Procedure: INSERTION OF MESH;  Surgeon: Axel Filler, MD;  Location: [facility]  ORS;  Service: General;  Laterality: N/A;  ? IR 3D INDEPENDENT [facility]ST  11/14/2019  ? IR 3D INDEPENDENT [facility]ST  11/22/2019  ? IR ANGIO INTRA EXTRACRAN SEL COM CAROTID INNOMINATE BILAT MOD SED  11/14/2019  ? IR ANGIO INTRA EXTRACRAN SEL INTERNAL CAROTID UNI R MOD SED  11/22/2019  ? IR ANGIO VERTEBRAL SEL VERTEBRAL BILAT MOD SED  11/14/2019  ? IR ANGIOGRAM FOLLOW UP STUDY  11/22/2019  ? IR CT HEAD LTD  11/22/2019  ? IR NEURO EACH ADD'L AFTER BASIC UNI RIGHT (MS)  11/22/2019  ? IR TRANSCATH/EMBOLIZ  11/22/2019  ? laceration to fingers    ? left hand  ? MOUTH SURGERY    ? secondary to abscess   ? RADIOLOGY WITH ANESTHESIA N/A 11/22/2019  ? Procedure: EMBOLIZATION;  Surgeon: Julieanne Cotton, MD;  Location: [facility] OR;  Service: Radiology;  Laterality: N/A;  ?  RIGHT/LEFT HEART CATH AND CORONARY ANGIOGRAPHY N/A 11/12/2019  ? Procedure: RIGHT/LEFT HEART CATH AND CORONARY ANGIOGRAPHY;  Surgeon: Corky Crafts, MD;  Location: [facility] INVASIVE CV LAB;  Service: Cardiovascular;  Laterality: N/A;  ? trauma to lip     ? sutured  ? VENTRAL HERNIA REPAIR N/A 06/27/2015  ? Procedure: LAPAROSCOPIC VENTRAL HERNIA;  Surgeon: Axel Filler, MD;  Location: [facility] ORS;  Service: General;  Laterality: N/A;  ? ? ? ?History reviewed. No pertinent family history. ? ? ?Social History  ? ?Socioeconomic History  ? Marital status: Married  ?  Spouse name: Not on file  ? Number of children: Not on file  ? Years of education: Not on file  ? Highest education level: Some college, no degree  ?Occupational History  ? Not on file  ?Tobacco Use  ? Smoking status: Former  ?  Packs/day: 0.25  ?  Years: 3.00  ?  Pack years: 0.75  ?  Types: Cigarettes  ?  Quit date: 08/17/2011  ?  Years since quitting: 10.1  ? Smokeless tobacco: Never  ?Vaping Use  ? Vaping Use: Never used  ?Substance and Sexual Activity  ? Alcohol use: Yes  ?  Alcohol/week: 1.0 standard drink  ?  Types: 1 Glasses of wine per week  ?  Comment: occasional wine  ? Drug use: No  ? Sexual activity: Yes  ?Other Topics Concern  ? Not on file  ?Social History Narrative  ? Not on file  ? ?Social Determinants of Health  ? ?Financial Resource Strain: Not on file  ?Food Insecurity: Not on file  ?Transportation Needs: Not on file  ?Physical Activity: Not on file  ?Stress: Not on file  ?Social Connections: Not on file  ?Intimate Partner Violence: Not on file  ? ? ? ?BP 92/60   Pulse 72   Ht 6\' 1"  (1.854 m)   Wt 260 lb (117.9 kg)   SpO2 93%   BMI 34.30 kg/m?  ? ?Physical Exam: ? ?Well appearing NAD ?HEENT: Unremarkable ?Neck:  No JVD, no thyromegally ?Lymphatics:  No adenopathy ?Back:  No CVA tenderness ?Lungs:  Clear ?HEART:  Regular rate rhythm, no murmurs, no rubs, no clicks ?Abd:  soft, positive bowel sounds, no organomegally, no rebound, no  guarding ?Ext:  2 plus pulses, no edema, no cyanosis, no clubbing ?Skin:  No rashes no nodules ?Neuro:  CN II through XII intact, motor grossly intact ? ?EKG - nsr with RBBB ? ? ?Assess/Plan:  ?1. Chronic systolic heart failure - his symptoms are class 2. He will continue his current meds. ?2. PAF -  he is maintaining NSR though has had some break through atrial flutter. He will continue low dose amiodarone and I will increase to 200 mg on Sat/Sun and 100 mg daily the rest of the time. He needs to lose weight. ?3. ICD - his [employer] ICD is working normaly. ?4. Dyslipidemia - he will continue his current meds. ?  ?Dorathy Daft. ? ?

## 2021-10-22 ENCOUNTER — Encounter (HOSPITAL_COMMUNITY): Payer: BC Managed Care – PPO | Admitting: Cardiology

## 2021-10-27 ENCOUNTER — Other Ambulatory Visit: Payer: Self-pay

## 2021-10-27 ENCOUNTER — Ambulatory Visit (HOSPITAL_COMMUNITY)
Admission: RE | Admit: 2021-10-27 | Discharge: 2021-10-27 | Disposition: A | Discharge status: Home / Self Care | Payer: BC Managed Care – PPO | Source: Ambulatory Visit | Attending: Cardiology | Admitting: Cardiology

## 2021-10-27 ENCOUNTER — Encounter (HOSPITAL_COMMUNITY): Payer: Self-pay | Admitting: Cardiology

## 2021-10-27 VITALS — BP 104/60 | HR 93 | Wt 263.2 lb

## 2021-10-27 DIAGNOSIS — I480 Paroxysmal atrial fibrillation: Secondary | ICD-10-CM | POA: Insufficient documentation

## 2021-10-27 DIAGNOSIS — G4733 Obstructive sleep apnea (adult) (pediatric): Secondary | ICD-10-CM | POA: Insufficient documentation

## 2021-10-27 DIAGNOSIS — F32A Depression, unspecified: Secondary | ICD-10-CM | POA: Diagnosis not present

## 2021-10-27 DIAGNOSIS — F419 Anxiety disorder, unspecified: Secondary | ICD-10-CM | POA: Insufficient documentation

## 2021-10-27 DIAGNOSIS — I255 Ischemic cardiomyopathy: Secondary | ICD-10-CM | POA: Insufficient documentation

## 2021-10-27 DIAGNOSIS — Z79899 Other long term (current) drug therapy: Secondary | ICD-10-CM | POA: Insufficient documentation

## 2021-10-27 DIAGNOSIS — I5022 Chronic systolic (congestive) heart failure: Secondary | ICD-10-CM | POA: Diagnosis not present

## 2021-10-27 DIAGNOSIS — Z7901 Long term (current) use of anticoagulants: Secondary | ICD-10-CM | POA: Diagnosis not present

## 2021-10-27 DIAGNOSIS — I2510 Atherosclerotic heart disease of native coronary artery without angina pectoris: Secondary | ICD-10-CM | POA: Diagnosis not present

## 2021-10-27 LAB — COMPREHENSIVE METABOLIC PANEL
ALT: 19 U/L (ref 0–44)
AST: 21 U/L (ref 15–41)
Albumin: 4.4 g/dL (ref 3.5–5.0)
Alkaline Phosphatase: 55 U/L (ref 38–126)
Anion gap: 12 (ref 5–15)
BUN: 22 mg/dL (ref 8–23)
CO2: 26 mmol/L (ref 22–32)
Calcium: 9.4 mg/dL (ref 8.9–10.3)
Chloride: 98 mmol/L (ref 98–111)
Creatinine, Ser: 1.05 mg/dL (ref 0.61–1.24)
GFR, Estimated: >60 mL/min (ref >60)
Glucose, Bld: 119 mg/dL — ABNORMAL HIGH (ref 70–99)
Potassium: 4.3 mmol/L (ref 3.5–5.1)
Sodium: 136 mmol/L (ref 135–145)
Total Bilirubin: 0.8 mg/dL (ref 0.3–1.2)
Total Protein: 7 g/dL (ref 6.5–8.1)

## 2021-10-27 LAB — TSH: TSH: 1.778 u[IU]/mL (ref 0.350–4.500)

## 2021-10-27 MED ORDER — METOPROLOL SUCCINATE ER 100 MG PO TB24: ORAL_TABLET | ORAL | 8 refills | Status: DC

## 2021-10-27 MED ORDER — METOPROLOL SUCCINATE ER 100 MG PO TB24: ORAL_TABLET | ORAL | 2 refills | Status: DC

## 2021-10-27 NOTE — Progress Notes (Signed)
ReDS Vest / Clip - 10/27/21 1100   ? ?  ? ReDS Vest / Clip  ? Station Marker D   ? Ruler Value 41   ? ReDS Value Range Moderate volume overload   ? ReDS Actual Value 38   ? ?  ?  ? ?  ? ? ?

## 2021-10-27 NOTE — Progress Notes (Signed)
PCP: Sueanne Margarita, DO ?Cardiology: Dr. Radford Pax ?HF Cardiology: Dr. Aundra Dubin ? ?66 y.o. with history of CAD, PAF, and ischemic cardiomyopathy was referred by Dr. Radford Pax for evaluation of CHF.  Patent was admitted to [facility] in 3/21 with CHF and atrial fibrillation/RVR. Echo showed EF 20-25%.  LHC was done, showing occluded mid LAD with collaterals.  Cardiac MRI showed EF 22%, apical aneurysm, no significant LAD territory viability; therefore, patient did not have CABG or PCI. Patient was additionally found to have right MCA aneurysm.  This was treated with embolization in 4/21.  In 5/21, he had DCCV back to NSR.  He has remained in NSR on amiodarone.  Repeat echo in 6/21 showed EF 20-25% with moderately decreased RV systolic function.  St Jude ICD was placed.  Patient has also been diagnosed with OSA and has started CPAP.  ? ?CPX 11/21 with no clear HF limitation.  ? ?Echo in 12/22 showed EF 35-40%, septal/apical akinesis, inferoapical dyskinesis, mild LV enlargement, RV normal, IVC normal.  ? ?Patient returns for followup of CHF.  Breathing is stable, he gets dyspneic with moderate activity.  No palpitations, he is in NSR today.  Not getting much exercise.  No chest pain.  No orthopnea/PND.  Fatigues easily.  ? ?ECG (personally reviewed): NSR, LAFB, RBBB ? ?REDS clip 38% ? ?Labs (9/21): K 4.9, creatinine 0.93 ?Labs (10/21): LDL 68, HDL 49, LFTs normal, TSH normal ?Labs (11/21): K 4.2, creatinine 0.95 ?Labs (12/21): K 4.3, creatinine 1.15 ?Labs (3/22): K 4.3, creatinine 1.12, LFTs normal, TSH normal, hgb 16.5 ?Labs (6/22): K 4.4, creatinine 1.13, LFTs normal, TSH normal ?Labs (9/22): LDL 59 ?Labs (12/22): K 4.2, creatinine 1.09, hgb 16.5.  ? ?PMH: ?1. Cerebral aneurysm: Right MCA aneurysm, embolized in 4/21 by IR.  ?2. Atrial fibrillation: Paroxysmal.  ?- DCCV 5/21 ?3. CAD: LHC (3/21) with totally occluded mid LAD with collaterals.  ?4. Chronic systolic CHF: Ischemic cardiomyopathy.   ?- Cardiac MRI (5/21): EF 22%, apical  aneurysm, LAD territory scarring without evidence for significant viability. RV EF 31%.  ?- Echo (6/21): EF 20-25%, moderately decreased RV systolic function.  ?- St Jude ICD.  ?- CPX (11/21): peak VO2 17.9 (81% predicted), VE/VCO2 slope 25, RER 1.17 => no clear HF limitation.  ?- Echo (9/22): EF 35-40% with peri-apical akinesis and apical inferior aneurysm, no LV thrombus noted, mildly decreased RV systolic function, normal IVC.  ?- Echo (12/22): EF 35-40%, septal/apical akinesis, inferoapical dyskinesis, mild LV enlargement, RV normal, IVC normal ?5. Gout ?6. OSA: Using CPAP.  ?7. Osteoarthritis ?8. Depression/anxiety ? ?Social History  ? ?Socioeconomic History  ? Marital status: Married  ?  Spouse name: Not on file  ? Number of children: Not on file  ? Years of education: Not on file  ? Highest education level: Some college, no degree  ?Occupational History  ? Not on file  ?Tobacco Use  ? Smoking status: Former  ?  Packs/day: 0.25  ?  Years: 3.00  ?  Pack years: 0.75  ?  Types: Cigarettes  ?  Quit date: 08/17/2011  ?  Years since quitting: 10.2  ? Smokeless tobacco: Never  ?Vaping Use  ? Vaping Use: Never used  ?Substance and Sexual Activity  ? Alcohol use: Yes  ?  Alcohol/week: 1.0 standard drink  ?  Types: 1 Glasses of wine per week  ?  Comment: occasional wine  ? Drug use: No  ? Sexual activity: Yes  ?Other Topics Concern  ? Not on file  ?  Social History Narrative  ? Not on file  ? ?Social Determinants of Health  ? ?Financial Resource Strain: Not on file  ?Food Insecurity: Not on file  ?Transportation Needs: Not on file  ?Physical Activity: Not on file  ?Stress: Not on file  ?Social Connections: Not on file  ?Intimate Partner Violence: Not on file  ? ?FH: No premature CAD or CHF.  ? ?ROS: All systems reviewed and negative except as per HPI.  ? ?Current Outpatient Medications  ?Medication Sig Dispense Refill  ? amiodarone (PACERONE) 200 MG tablet Take 1/2 tablet by mouth Monday through Friday.  Take a WHOLE tablet  by mouth Saturday and Sunday. 60 tablet 3  ? apixaban (ELIQUIS) 5 MG TABS tablet TAKE 1 TABLET (5 MG TOTAL) BY MOUTH 2 TIMES DAILY. 180 tablet 3  ? aspirin EC 81 MG tablet Take 81 mg by mouth daily.    ? colchicine 0.6 MG tablet Take 0.6 mg by mouth 2 (two) times daily as needed (for gout flares).     ? diclofenac sodium (VOLTAREN) 1 % GEL Apply 2 g topically 2 (two) times daily as needed (to affected sites- for arthritis pain).     ? empagliflozin (JARDIANCE) 10 MG TABS tablet Take by mouth daily.    ? furosemide (LASIX) 20 MG tablet Take 3 tablets (60 mg total) by mouth daily. 90 tablet 4  ? mupirocin ointment (BACTROBAN) 2 % Apply 1 application topically 2 (two) times daily.    ? oxyCODONE-acetaminophen (PERCOCET/ROXICET) 5-325 MG tablet Take 1 tablet by mouth in the morning, at noon, and at bedtime.    ? rosuvastatin (CRESTOR) 5 MG tablet TAKE 1 TABLET(5 MG) BY MOUTH DAILY 90 tablet 3  ? sacubitril-valsartan (ENTRESTO) 49-51 MG TAKE 1 TABLET BY MOUTH 2 TIMES DAILY. 180 tablet 3  ? sertraline (ZOLOFT) 25 MG tablet Take 1 tablet (25 mg total) by mouth daily. 30 tablet 3  ? spironolactone (ALDACTONE) 25 MG tablet Take 1 tablet (25 mg total) by mouth daily. 90 tablet 3  ? metoprolol succinate (TOPROL-XL) 100 MG 24 hr tablet Take 1 tablet (100 mg total) by mouth in the morning AND 0.5 tablets (50 mg total) every evening. Take with or immediately following a meal.. 180 tablet 2  ? ?No current facility-administered medications for this encounter.  ? ?BP 104/60   Pulse 93   Wt 119.4 kg (263 lb 3.2 oz)   SpO2 94%   BMI 34.73 kg/m?  ?General: NAD ?Neck: No JVD, no thyromegaly or thyroid nodule.  ?Lungs: Clear to auscultation bilaterally with normal respiratory effort. ?CV: Nondisplaced PMI.  Heart regular S1/S2, no S3/S4, no murmur.  No peripheral edema.  No carotid bruit.  Normal pedal pulses.  ?Abdomen: Soft, nontender, no hepatosplenomegaly, no distention.  ?Skin: Intact without lesions or rashes.  ?Neurologic:  Alert and oriented x 3.  ?Psych: Normal affect. ?Extremities: No clubbing or cyanosis.  ?HEENT: Normal.  ? ?Assessment/Plan: ?1. CAD: LHC in 3/21 with occluded mid LAD with collaterals.  Cardiac MRI was not suggestive of LAD territory viability, there was an apical aneurysm but no thrombus.  Patient was managed medically. No chest pain.  ?- He is on Eliquis for atrial fibrillation and also ASA 81 for cerebral aneurysm embolization.  ?- Continue Crestor, good lipids in 9/22.  ?2. Atrial fibrillation: Paroxysmal, s/p DCCV to NSR in 5/21. He has been maintained in NSR on amiodarone.  NSR today.  ?- Continue amiodarone 100 mg daily.  Check LFTs and TSH  today.  Will need regular eye exam.   ?- Continue Eliquis. ?- He discussed atrial fibrillation ablation with Dr. Lovena Le, who recommended weight loss before considering.  ?3. Chronic systolic CHF: Ischemic cardiomyopathy.  Echo in 6/21 with EF 20-25%, moderately decreased RV systolic function.  St Jude ICD, not CRT candidate.  CPX in 11/21 with minimal HF limitation. Echo in 12/22 showed EF 35-40%, apical inferior aneurysm.  He continues to have significant exertional fatigue and dyspnea, NYHA class III symptoms (stable). Weight up 2 lbs, though he does not look significantly volume overloaded on exam, his REDS clip is mildly elevated at 38% today.  ?- Continue Entresto 49/51 bid.  ?- Increase Lasix to 80 mg daily x 3 days then back to 60 mg daily.  ?- Continue spironolactone 25 mg daily. BMET today.  ?- Continue Farxiga 10 mg daily.   ?- Increase Toprol XL to 100 qam, 50 qpm.    ?4. Right MCA aneurysm: S/p embolization.  ?- Following with IR, still on ASA 81 in addition to Eliquis.  Stop ASA when ok with neuro IR => he will ask IR if he can stop now given Eliquis use.   ?5. OSA: Using CPAP.  ?6. Depression/anxiety:   ?- Continue sertraline.  ? ?Followup in 3 months with APP.   ? ?Loralie Champagne ?10/27/2021 ? ? ? ?

## 2021-10-27 NOTE — Patient Instructions (Signed)
Medication Changes: ? ?Increase Toprol XL to 100mg  in the morning and 50mg  in the evening ? ?Take 80 mg lasix daily for 3 days than back to 60 mg daily  ? ?Lab Work: ? ?Labs done today, your results will be available in MyChart, we will contact you for abnormal readings. ? ? ?Testing/Procedures: ? ?none ? ?Referrals: ? ?You have been referred to the [facility] PREP class. They will call you to arrange appointments ? ?Special Instructions // Education: ? ?none ? ?Follow-Up in: 3 months  ? ?At the [facility], you and your health needs are our priority. We have a designated team specialized in the treatment of Heart Failure. This Care Team includes your primary Heart Failure Specialized Cardiologist (physician), Advanced Practice Providers (APPs- Physician Assistants and Nurse Practitioners), and Pharmacist who all work together to provide you with the care you need, when you need it.  ? ?You may see any of the following providers on your designated Care Team at your next follow up: ? ?Dr Glori Bickers ?Dr Loralie Champagne ?Darrick Grinder, NP ?Lyda Jester, PA ?Jessica Milford,NP ?Marlyce Huge, PA ?Audry Riles, PharmD ? ? ?Please be sure to bring in all your medications bottles to every appointment.  ? ?Need to Contact us: ? ?If you have any questions or concerns before your next appointment please send [place] a message through [place] or call our office at [phone].   ? ?TO LEAVE A MESSAGE FOR THE NURSE SELECT OPTION 2, PLEASE LEAVE A MESSAGE INCLUDING: ?YOUR NAME ?DATE OF BIRTH ?CALL BACK NUMBER ?REASON FOR CALL**this is important as we prioritize the call backs ? ?YOU WILL RECEIVE A CALL BACK THE SAME DAY AS LONG AS YOU CALL BEFORE 4:00 PM ? ? ?

## 2021-10-30 ENCOUNTER — Telehealth: Payer: Self-pay

## 2021-10-30 NOTE — Telephone Encounter (Signed)
LVMT pt requesting call back to discuss PREP referral 

## 2021-11-03 ENCOUNTER — Telehealth: Payer: Self-pay

## 2021-11-03 ENCOUNTER — Encounter (HOSPITAL_COMMUNITY): Payer: Self-pay | Admitting: *Deleted

## 2021-11-03 NOTE — Progress Notes (Signed)
[facility] PREP Evaluation ? ?Patient Details  ?Name: Richard Graham ?MRN: [id number] ?Date of Birth: [birth year] ?Age: 66 y.o. ?PCP: Sueanne Margarita, DO ? ?Vitals:  ? 11/03/21 1536  ?BP: 96/78  ?Pulse: 81  ?Resp: (!) 94  ?Weight: 262 lb (118.8 kg)  ? ? ? [facility] Eval - 11/03/21 1500   ? ?  ? [facility] "PREP[facility] Location  ? [facility] "PREP" Location [place]   ?  ? Referral   ? Referring Provider Aundra Dubin   ? Reason for referral Heart Failure;High Cholesterol;Inactivity;Other   Afib  ? Program Start Date 11/09/21   M/W 230-345p x 12 wks  ?  ? Measurement  ? Waist Circumference 50 inches   ? Hip Circumference 47.5 inches   ? Body fat --   not calculated due to pacer  ?  ? Information for Trainer  ? Goals Lose weight goal 220   ? Current Exercise none   ? Orthopedic Concerns Knees, wrists   ? Pertinent Medical History chf, cardiac arrest, pacer, high chol/lipids, brain aneurysm   ? Current Barriers none   ? Restrictions/Precautions Fall risk   slow gait  ? Medications that affect exercise Medication causing dizziness/drowsiness;Beta blocker   ?  ? Timed Up and Go (TUGS)  ? Timed Up and Go High risk >13 seconds   ?  ? Mobility and Daily Activities  ? I find it easy to walk up or down two or more flights of stairs. 1   ? I have no trouble taking out the trash. 2   ? I do housework such as vacuuming and dusting on my own without difficulty. 1   ? I can easily lift a gallon of milk (8lbs). 4   ? I can easily walk a mile. 1   ? I have no trouble reaching into high cupboards or reaching down to pick up something from the floor. 2   ? I do not have trouble doing out-door work such as Armed forces logistics/support/administrative officer, raking leaves, or gardening. 1   ?  ? Mobility and Daily Activities  ? I feel younger than my age. 2   ? I feel independent. 2   ? I feel energetic. 2   ? I live an active life.  1   ? I feel strong. 1   ? I feel healthy. 2   ? I feel active as other people my age. 1   ?  ? How fit and strong are you.  ? Fit and Strong Total Score 23   ? ?  ?   ? ?  ? ?Past Medical History:  ?Diagnosis Date  ? CAD (coronary artery disease), native coronary artery   ? chronically occluded mid LAD with left to left collaterals.   ? Cerebral aneurysm   ? s/p embolization  ? Chronic systolic (congestive) heart failure (HCC)   ? biventricular HF with moderate RV dysfunction and severe LV dysfunction by Cardiac MRI with EF 22% no viability in the apical septal/apical/inferior,mid anterior and true apex.    ? Gout   ? HLD (hyperlipidemia)   ? PAF (paroxysmal atrial fibrillation) ([place])   ? s/p DCCV 12/2019  ? Pre-diabetes   ? diet controlled, no meds  ? ?Past Surgical History:  ?Procedure Laterality Date  ? CARDIAC CATHETERIZATION    ? CARDIOVERSION N/A 12/28/2019  ? Procedure: CARDIOVERSION;  Surgeon: Skeet Latch, MD;  Location: [place];  Service: Cardiovascular;  Laterality: N/A;  ? COLONOSCOPY    ?  ICD IMPLANT N/A 03/20/2020  ? Procedure: ICD IMPLANT;  Surgeon: Evans Lance, MD;  Location: [place] CV LAB;  Service: Cardiovascular;  Laterality: N/A;  ? INSERTION OF MESH N/A 06/27/2015  ? Procedure: INSERTION OF MESH;  Surgeon: Ralene Ok, MD;  Location: [facility] ORS;  Service: General;  Laterality: N/A;  ? IR 3D INDEPENDENT WKST  11/14/2019  ? IR 3D INDEPENDENT [facility]KST  11/22/2019  ? IR ANGIO INTRA EXTRACRAN SEL COM CAROTID INNOMINATE BILAT MOD SED  11/14/2019  ? IR ANGIO INTRA EXTRACRAN SEL INTERNAL CAROTID UNI R MOD SED  11/22/2019  ? IR ANGIO VERTEBRAL SEL VERTEBRAL BILAT MOD SED  11/14/2019  ? IR ANGIOGRAM FOLLOW UP STUDY  11/22/2019  ? IR CT HEAD LTD  11/22/2019  ? IR NEURO EACH ADD'L AFTER BASIC UNI RIGHT (MS)  11/22/2019  ? IR TRANSCATH/EMBOLIZ  11/22/2019  ? laceration to fingers    ? left hand  ? MOUTH SURGERY    ? secondary to abscess   ? RADIOLOGY WITH ANESTHESIA N/A 11/22/2019  ? Procedure: EMBOLIZATION;  Surgeon: Luanne Bras, MD;  Location: [place];  Service: Radiology;  Laterality: N/A;  ? RIGHT/LEFT HEART CATH AND CORONARY ANGIOGRAPHY N/A 11/12/2019  ? Procedure:  RIGHT/LEFT HEART CATH AND CORONARY ANGIOGRAPHY;  Surgeon: Jettie Booze, MD;  Location: [place] CV LAB;  Service: Cardiovascular;  Laterality: N/A;  ? trauma to lip     ? sutured  ? VENTRAL HERNIA REPAIR N/A 06/27/2015  ? Procedure: LAPAROSCOPIC VENTRAL HERNIA;  Surgeon: Ralene Ok, MD;  Location: [facility];  Service: General;  Laterality: N/A;  ? ?Social History  ? ?Tobacco Use  ?Smoking Status Former  ? Packs/day: 0.25  ? Years: 3.00  ? Pack years: 0.75  ? Types: Cigarettes  ? Quit date: 08/17/2011  ? Years since quitting: 10.2  ?Smokeless Tobacco Never  ? ? ?Barnett Hatter ?11/03/2021, 3:41 PM ? ? ?

## 2021-11-03 NOTE — Telephone Encounter (Signed)
S/W pt reference PREP class yesterday (confirmed appt via phone today) Intake at 230pm at [facility] ?Class start will be 11/09/21 ?Will need assist with pay for program  ?

## 2021-11-05 ENCOUNTER — Other Ambulatory Visit: Payer: Self-pay | Admitting: Internal Medicine

## 2021-11-25 ENCOUNTER — Telehealth: Payer: Self-pay

## 2021-11-25 NOTE — Telephone Encounter (Signed)
Received vmf pt today requesting call back reference ability to finish PREP group ?Pt's youngest brother is ill ad needs assistance.  ?Called pt back. Requests to come back and take another class in the future. Talked with him likely will be around end of June or July before a new slot opens for class.  ?Will contact him then. Asked that he call me should his schedule change.  ?

## 2021-12-14 ENCOUNTER — Other Ambulatory Visit (HOSPITAL_COMMUNITY): Payer: Self-pay

## 2021-12-14 MED ORDER — FUROSEMIDE 20 MG PO TABS: 60.0000 mg | ORAL_TABLET | Freq: Every day | ORAL | 4 refills | Status: DC

## 2021-12-17 ENCOUNTER — Ambulatory Visit (INDEPENDENT_AMBULATORY_CARE_PROVIDER_SITE_OTHER): Payer: BC Managed Care – PPO

## 2021-12-17 DIAGNOSIS — I5022 Chronic systolic (congestive) heart failure: Secondary | ICD-10-CM

## 2021-12-17 LAB — CUP PACEART REMOTE DEVICE CHECK
Battery Remaining Longevity: 101 mo
Battery Remaining Percentage: 83 %
Battery Voltage: 2.99 V
Brady Statistic RV Percent Paced: 1 %
Date Time Interrogation Session: 20230504030141
HighPow Impedance: 81 Ohm
Implantable Lead Implant Date: 20210805
Implantable Lead Location: 753860
Implantable Pulse Generator Implant Date: 20210805
Lead Channel Impedance Value: 410 Ohm
Lead Channel Pacing Threshold Amplitude: 1 V
Lead Channel Pacing Threshold Pulse Width: 0.5 ms
Lead Channel Sensing Intrinsic Amplitude: 11.7 mV
Lead Channel Setting Pacing Amplitude: 2.5 V
Lead Channel Setting Pacing Pulse Width: 0.5 ms
Lead Channel Setting Sensing Sensitivity: 0.5 mV
Pulse Gen Serial Number: 111025604

## 2021-12-30 NOTE — Progress Notes (Signed)
Remote ICD transmission.   

## 2022-01-14 ENCOUNTER — Telehealth: Payer: Self-pay

## 2022-01-14 NOTE — Telephone Encounter (Signed)
LVMT requesting call back-does he still want to do PREP starting Monday or a July class?

## 2022-01-26 ENCOUNTER — Other Ambulatory Visit (HOSPITAL_COMMUNITY): Payer: Self-pay

## 2022-01-29 ENCOUNTER — Telehealth: Payer: Self-pay

## 2022-01-29 NOTE — Telephone Encounter (Signed)
Call from pt this am, returned his call. He is interested in the next PREP class. Can do July 10th MW 230p-345p.  Admits to struggling with the loss of his brother. Open to therapy but hasn't pursued it yet. Encouraged reaching out for a referral so he can talk with someone. Will reach back to him closer to start of July 10th class.

## 2022-02-08 ENCOUNTER — Telehealth: Payer: Self-pay

## 2022-02-18 ENCOUNTER — Encounter (HOSPITAL_COMMUNITY): Payer: BC Managed Care – PPO

## 2022-02-19 ENCOUNTER — Telehealth: Payer: Self-pay

## 2022-02-19 NOTE — Telephone Encounter (Signed)
Call from pt requesting to change intake appt for PREP class starting on 02/22/22 Returned call to pt. Will complete intake at end of class on 02/22/22

## 2022-02-22 ENCOUNTER — Other Ambulatory Visit: Payer: Self-pay | Admitting: Cardiology

## 2022-02-23 NOTE — Progress Notes (Signed)
[facility] PREP Weekly Session  Patient Details  Name: Richard Graham MRN: [id number] Date of Birth: 08/23/1955 Age: 66 y.o. PCP: [name], DO  There were no vitals filed for this visit.   [facility] Weekly seesion - 02/23/22 1700       [facility] "PREP" Location   [facility]      Weekly Session   Topic Discussed Goal setting and welcome to the program   scale of perceived exertion   Classes attended to date 1             Bonnye Fava 02/23/2022, 5:28 PM

## 2022-02-25 NOTE — Progress Notes (Signed)
[facility] PREP Evaluation  Patient Details  Name: Richard Graham MRN: [id number] Date of Birth: 1956-02-04 Age: 66 y.o. PCP: [name], DO  Vitals:   02/22/22 1545  BP: 100/76  Pulse: 88  SpO2: 97%  Weight: 260 lb 6.4 oz (118.1 kg)     [facility] Eval - 02/25/22 1600       [facility] "PREP" Location   [facility]PREP" Location [facility]      Referral    Referring Provider Shirlee Latch    Reason for referral Heart Failure;Hypertension;Inactivity;Orthopedic    Program Start Date 02/22/22      Measurement   Waist Circumference 49 inches    Hip Circumference 48 inches    Body fat --   not measured has PACER     Information for Trainer   Goals regain stamina and endurance, wt loss goal 225    Current Exercise none    Orthopedic Concerns Knees, wrists    Pertinent Medical History CHF, AFIB, Brain aneurysm, HTN, Gout, Cardiac arrest, inc chol and lipids    Current Barriers mindset    Restrictions/Precautions Fall risk;Assistive device    Medications that affect exercise Medication causing dizziness/drowsiness      Timed Up and Go (TUGS)   Timed Up and Go High risk >13 seconds      Mobility and Daily Activities   I find it easy to walk up or down two or more flights of stairs. 1    I have no trouble taking out the trash. 3    I do housework such as vacuuming and dusting on my own without difficulty. 1    I can easily lift a gallon of milk (8lbs). 3    I can easily walk a mile. 1    I have no trouble reaching into high cupboards or reaching down to pick up something from the floor. 1    I do not have trouble doing out-door work such as Loss adjuster, chartered, raking leaves, or gardening. 1      Mobility and Daily Activities   I feel younger than my age. 2    I feel independent. 2    I feel energetic. 1    I live an active life.  1    I feel strong. 1    I feel healthy. 2    I feel active as other people my age. 1      How fit and strong are you.   Fit and Strong Total Score 21             Past Medical History:  Diagnosis Date   CAD (coronary artery disease), native coronary artery    chronically occluded mid LAD with left to left collaterals.    Cerebral aneurysm    s/p embolization   Chronic systolic (congestive) heart failure (HCC)    biventricular HF with moderate RV dysfunction and severe LV dysfunction by Cardiac MRI with EF 22% no viability in the apical septal/apical/inferior,mid anterior and true apex.     Gout    HLD (hyperlipidemia)    PAF (paroxysmal atrial fibrillation) (HCC)    s/p DCCV 12/2019   Pre-diabetes    diet controlled, no meds   Past Surgical History:  Procedure Laterality Date   CARDIAC CATHETERIZATION     CARDIOVERSION N/A 12/28/2019   Procedure: CARDIOVERSION;  Surgeon: Chilton Si, MD;  Location: [facility] ENDOSCOPY;  Service: Cardiovascular;  Laterality: N/A;   COLONOSCOPY     ICD IMPLANT  N/A 03/20/2020   Procedure: ICD IMPLANT;  Surgeon: Marinus Maw, MD;  Location: [facility] INVASIVE CV LAB;  Service: Cardiovascular;  Laterality: N/A;   INSERTION OF MESH N/A 06/27/2015   Procedure: INSERTION OF MESH;  Surgeon: Axel Filler, MD;  Location: [facility] ORS;  Service: General;  Laterality: N/A;   IR 3D INDEPENDENT WKST  11/14/2019   IR 3D INDEPENDENT WKST  11/22/2019   IR ANGIO INTRA EXTRACRAN SEL COM CAROTID INNOMINATE BILAT MOD SED  11/14/2019   IR ANGIO INTRA EXTRACRAN SEL INTERNAL CAROTID UNI R MOD SED  11/22/2019   IR ANGIO VERTEBRAL SEL VERTEBRAL BILAT MOD SED  11/14/2019   IR ANGIOGRAM FOLLOW UP STUDY  11/22/2019   IR CT HEAD LTD  11/22/2019   IR NEURO EACH ADD'L AFTER BASIC UNI RIGHT (MS)  11/22/2019   IR TRANSCATH/EMBOLIZ  11/22/2019   laceration to fingers     left hand   MOUTH SURGERY     secondary to abscess    RADIOLOGY WITH ANESTHESIA N/A 11/22/2019   Procedure: EMBOLIZATION;  Surgeon: Julieanne Cotton, MD;  Location: [facility] OR;  Service: Radiology;  Laterality: N/A;   RIGHT/LEFT HEART CATH AND CORONARY ANGIOGRAPHY N/A 11/12/2019   Procedure:  RIGHT/LEFT HEART CATH AND CORONARY ANGIOGRAPHY;  Surgeon: Corky Crafts, MD;  Location: [facility] INVASIVE CV LAB;  Service: Cardiovascular;  Laterality: N/A;   trauma to lip      sutured   VENTRAL HERNIA REPAIR N/A 06/27/2015   Procedure: LAPAROSCOPIC VENTRAL HERNIA;  Surgeon: Axel Filler, MD;  Location: [facility] ORS;  Service: General;  Laterality: N/A;   Social History   Tobacco Use  Smoking Status Former   Packs/day: 0.25   Years: 3.00   Total pack years: 0.75   Types: Cigarettes   Quit date: 08/17/2011   Years since quitting: 10.5  Smokeless Tobacco Never    Bonnye Fava 02/25/2022, 4:22 PM

## 2022-03-04 NOTE — Progress Notes (Signed)
[facility] PREP Weekly Session  Patient Details  Name: Richard Graham MRN: [id number] Date of Birth: 1955-09-02 Age: 65 y.o. PCP: [name], DO  Vitals:   03/01/22 1430  Weight: 262 lb (118.8 kg)     [facility] Weekly seesion - 03/04/22 1000       [facility] "PREP" Location   [facility] "PREP" Location [facility]      Weekly Session   Topic Discussed Importance of resistance training;Other ways to be active    Minutes exercised this week 75 minutes    Classes attended to date 4             Pam Jerral Bonito 03/04/2022, 10:20 AM

## 2022-03-08 NOTE — Progress Notes (Signed)
PCP: [name], DO Cardiology: Dr. Mayford Knife HF Cardiology: Dr. Shirlee Latch  66 y.o. with history of CAD, PAF, and ischemic cardiomyopathy was referred by Dr. Mayford Knife for evaluation of CHF.  Patent was admitted to [facility] in 3/21 with CHF and atrial fibrillation/RVR. Echo showed EF 20-25%.  LHC was done, showing occluded mid LAD with collaterals.  Cardiac MRI showed EF 22%, apical aneurysm, no significant LAD territory viability; therefore, patient did not have CABG or PCI. Patient was additionally found to have right MCA aneurysm.  This was treated with embolization in 4/21.  In 5/21, he had DCCV back to NSR.  He has remained in NSR on amiodarone.  Repeat echo in 6/21 showed EF 20-25% with moderately decreased RV systolic function.  St Jude ICD was placed.  Patient has also been diagnosed with OSA and has started CPAP.   CPX 11/21 with no clear HF limitation.   Echo in 12/22 showed EF 35-40%, septal/apical akinesis, inferoapical dyskinesis, mild LV enlargement, RV normal, IVC normal.   Patient returns for followup of CHF.  Breathing is stable, he gets dyspneic with moderate activity.  No palpitations, he is in NSR today.  Not getting much exercise.  No chest pain.  No orthopnea/PND.  Fatigues easily.   ECG (personally reviewed): NSR, LAFB, RBBB  REDS clip 38%  Labs (9/21): K 4.9, creatinine 0.93 Labs (10/21): LDL 68, HDL 49, LFTs normal, TSH normal Labs (11/21): K 4.2, creatinine 0.95 Labs (12/21): K 4.3, creatinine 1.15 Labs (3/22): K 4.3, creatinine 1.12, LFTs normal, TSH normal, hgb 16.5 Labs (6/22): K 4.4, creatinine 1.13, LFTs normal, TSH normal Labs (9/22): LDL 59 Labs (12/22): K 4.2, creatinine 1.09, hgb 16.5.   PMH: 1. Cerebral aneurysm: Right MCA aneurysm, embolized in 4/21 by IR.  2. Atrial fibrillation: Paroxysmal.  - DCCV 5/21 3. CAD: LHC (3/21) with totally occluded mid LAD with collaterals.  4. Chronic systolic CHF: Ischemic cardiomyopathy.   - Cardiac MRI (5/21): EF 22%, apical  aneurysm, LAD territory scarring without evidence for significant viability. RV EF 31%.  - Echo (6/21): EF 20-25%, moderately decreased RV systolic function.  - St Jude ICD.  - CPX (11/21): peak VO2 17.9 (81% predicted), VE/VCO2 slope 25, RER 1.17 => no clear HF limitation.  - Echo (9/22): EF 35-40% with peri-apical akinesis and apical inferior aneurysm, no LV thrombus noted, mildly decreased RV systolic function, normal IVC.  - Echo (12/22): EF 35-40%, septal/apical akinesis, inferoapical dyskinesis, mild LV enlargement, RV normal, IVC normal 5. Gout 6. OSA: Using CPAP.  7. Osteoarthritis 8. Depression/anxiety  Social History   Socioeconomic History   Marital status: Married    Spouse name: Not on file   Number of children: Not on file   Years of education: Not on file   Highest education level: Some college, no degree  Occupational History   Not on file  Tobacco Use   Smoking status: Former    Packs/day: 0.25    Years: 3.00    Total pack years: 0.75    Types: Cigarettes    Quit date: 08/17/2011    Years since quitting: 10.5   Smokeless tobacco: Never  Vaping Use   Vaping Use: Never used  Substance and Sexual Activity   Alcohol use: Yes    Alcohol/week: 1.0 standard drink of alcohol    Types: 1 Glasses of wine per week    Comment: occasional wine   Drug use: No   Sexual activity: Yes  Other Topics Concern  Not on file  Social History Narrative   Not on file   Social Determinants of Health   Financial Resource Strain: Not on file  Food Insecurity: Not on file  Transportation Needs: Not on file  Physical Activity: Not on file  Stress: Not on file  Social Connections: Not on file  Intimate Partner Violence: Not on file   FH: No premature CAD or CHF.   ROS: All systems reviewed and negative except as per HPI.   Current Outpatient Medications  Medication Sig Dispense Refill   amiodarone (PACERONE) 200 MG tablet Take 1/2 tablet by mouth Monday through Friday.   Take a WHOLE tablet by mouth Saturday and Sunday. 60 tablet 3   apixaban (ELIQUIS) 5 MG TABS tablet TAKE 1 TABLET (5 MG TOTAL) BY MOUTH 2 TIMES DAILY. 180 tablet 3   aspirin EC 81 MG tablet Take 81 mg by mouth daily.     colchicine 0.6 MG tablet Take 0.6 mg by mouth 2 (two) times daily as needed (for gout flares).      diclofenac sodium (VOLTAREN) 1 % GEL Apply 2 g topically 2 (two) times daily as needed (to affected sites- for arthritis pain).      empagliflozin (JARDIANCE) 10 MG TABS tablet Take by mouth daily.     furosemide (LASIX) 20 MG tablet Take 3 tablets (60 mg total) by mouth daily. 90 tablet 4   metoprolol succinate (TOPROL-XL) 100 MG 24 hr tablet Take 1 tablet (100 mg total) by mouth in the morning AND 0.5 tablets (50 mg total) every evening. Take with or immediately following a meal.. 180 tablet 2   mupirocin ointment (BACTROBAN) 2 % Apply 1 application topically 2 (two) times daily.     oxyCODONE-acetaminophen (PERCOCET/ROXICET) 5-325 MG tablet Take 1 tablet by mouth in the morning, at noon, and at bedtime.     rosuvastatin (CRESTOR) 5 MG tablet TAKE 1 TABLET(5 MG) BY MOUTH DAILY 90 tablet 3   sacubitril-valsartan (ENTRESTO) 49-51 MG TAKE 1 TABLET BY MOUTH 2 TIMES DAILY. 180 tablet 3   sertraline (ZOLOFT) 25 MG tablet Take 1 tablet (25 mg total) by mouth daily. 30 tablet 3   spironolactone (ALDACTONE) 25 MG tablet Take 1 tablet (25 mg total) by mouth daily. 90 tablet 3   No current facility-administered medications for this visit.   There were no vitals taken for this visit. General: NAD Neck: No JVD, no thyromegaly or thyroid nodule.  Lungs: Clear to auscultation bilaterally with normal respiratory effort. CV: Nondisplaced PMI.  Heart regular S1/S2, no S3/S4, no murmur.  No peripheral edema.  No carotid bruit.  Normal pedal pulses.  Abdomen: Soft, nontender, no hepatosplenomegaly, no distention.  Skin: Intact without lesions or rashes.  Neurologic: Alert and oriented x 3.   Psych: Normal affect. Extremities: No clubbing or cyanosis.  HEENT: Normal.   Assessment/Plan: 1. CAD: LHC in 3/21 with occluded mid LAD with collaterals.  Cardiac MRI was not suggestive of LAD territory viability, there was an apical aneurysm but no thrombus.  Patient was managed medically. No chest pain.  - He is on Eliquis for atrial fibrillation and also ASA 81 for cerebral aneurysm embolization.  - Continue Crestor, good lipids in 9/22.  2. Atrial fibrillation: Paroxysmal, s/p DCCV to NSR in 5/21. He has been maintained in NSR on amiodarone.  NSR today.  - Continue amiodarone 100 mg daily.  Check LFTs and TSH today.  Will need regular eye exam.   - Continue Eliquis. -  He discussed atrial fibrillation ablation with Dr. Ladona Ridgel, who recommended weight loss before considering.  3. Chronic systolic CHF: Ischemic cardiomyopathy.  Echo in 6/21 with EF 20-25%, moderately decreased RV systolic function.  St Jude ICD, not CRT candidate.  CPX in 11/21 with minimal HF limitation. Echo in 12/22 showed EF 35-40%, apical inferior aneurysm.  He continues to have significant exertional fatigue and dyspnea, NYHA class III symptoms (stable). Weight up 2 lbs, though he does not look significantly volume overloaded on exam, his REDS clip is mildly elevated at 38% today.  - Continue Entresto 49/51 bid.  - Increase Lasix to 80 mg daily x 3 days then back to 60 mg daily.  - Continue spironolactone 25 mg daily. BMET today.  - Continue Farxiga 10 mg daily.   - Increase Toprol XL to 100 qam, 50 qpm.    4. Right MCA aneurysm: S/p embolization.  - Following with IR, still on ASA 81 in addition to Eliquis.  Stop ASA when ok with neuro IR => he will ask IR if he can stop now given Eliquis use.   5. OSA: Using CPAP.  6. Depression/anxiety:   - Continue sertraline.   Followup in 3 months with APP.    Anderson Malta [facility] 03/08/2022

## 2022-03-09 ENCOUNTER — Encounter (HOSPITAL_COMMUNITY): Payer: Self-pay

## 2022-03-09 ENCOUNTER — Ambulatory Visit (HOSPITAL_COMMUNITY)
Admission: RE | Admit: 2022-03-09 | Discharge: 2022-03-09 | Disposition: A | Discharge status: Home / Self Care | Payer: BC Managed Care – PPO | Source: Ambulatory Visit | Attending: Family Medicine | Admitting: Family Medicine

## 2022-03-09 VITALS — BP 100/66 | HR 84 | Wt 265.4 lb

## 2022-03-09 DIAGNOSIS — I5022 Chronic systolic (congestive) heart failure: Secondary | ICD-10-CM | POA: Insufficient documentation

## 2022-03-09 DIAGNOSIS — I671 Cerebral aneurysm, nonruptured: Secondary | ICD-10-CM | POA: Diagnosis not present

## 2022-03-09 DIAGNOSIS — I2510 Atherosclerotic heart disease of native coronary artery without angina pectoris: Secondary | ICD-10-CM | POA: Diagnosis not present

## 2022-03-09 DIAGNOSIS — M549 Dorsalgia, unspecified: Secondary | ICD-10-CM | POA: Insufficient documentation

## 2022-03-09 DIAGNOSIS — Z79899 Other long term (current) drug therapy: Secondary | ICD-10-CM | POA: Insufficient documentation

## 2022-03-09 DIAGNOSIS — Z7984 Long term (current) use of oral hypoglycemic drugs: Secondary | ICD-10-CM | POA: Diagnosis not present

## 2022-03-09 DIAGNOSIS — I2583 Coronary atherosclerosis due to lipid rich plaque: Secondary | ICD-10-CM

## 2022-03-09 DIAGNOSIS — I480 Paroxysmal atrial fibrillation: Secondary | ICD-10-CM | POA: Diagnosis not present

## 2022-03-09 DIAGNOSIS — G8929 Other chronic pain: Secondary | ICD-10-CM

## 2022-03-09 DIAGNOSIS — M545 Low back pain, unspecified: Secondary | ICD-10-CM

## 2022-03-09 DIAGNOSIS — G4733 Obstructive sleep apnea (adult) (pediatric): Secondary | ICD-10-CM | POA: Diagnosis not present

## 2022-03-09 DIAGNOSIS — I255 Ischemic cardiomyopathy: Secondary | ICD-10-CM | POA: Diagnosis not present

## 2022-03-09 DIAGNOSIS — F419 Anxiety disorder, unspecified: Secondary | ICD-10-CM | POA: Diagnosis not present

## 2022-03-09 DIAGNOSIS — F32A Depression, unspecified: Secondary | ICD-10-CM | POA: Diagnosis not present

## 2022-03-09 DIAGNOSIS — Z7901 Long term (current) use of anticoagulants: Secondary | ICD-10-CM | POA: Diagnosis not present

## 2022-03-09 DIAGNOSIS — Z7982 Long term (current) use of aspirin: Secondary | ICD-10-CM | POA: Insufficient documentation

## 2022-03-09 LAB — BASIC METABOLIC PANEL
Anion gap: 10 (ref 5–15)
BUN: 22 mg/dL (ref 8–23)
CO2: 26 mmol/L (ref 22–32)
Calcium: 9.4 mg/dL (ref 8.9–10.3)
Chloride: 102 mmol/L (ref 98–111)
Creatinine, Ser: 1.04 mg/dL (ref 0.61–1.24)
GFR, Estimated: >60 mL/min (ref >60)
Glucose, Bld: 103 mg/dL — ABNORMAL HIGH (ref 70–99)
Potassium: 4.6 mmol/L (ref 3.5–5.1)
Sodium: 138 mmol/L (ref 135–145)

## 2022-03-09 LAB — BRAIN NATRIURETIC PEPTIDE: B Natriuretic Peptide: 30.5 pg/mL (ref 0.0–100.0)

## 2022-03-09 NOTE — Patient Instructions (Signed)
Thank you for coming in today  Labs were done today, if any labs are abnormal the clinic will call you No news is good news  Your physician recommends that you schedule a follow-up appointment in:  3-4 months with Dr. Mclean    Do the following things EVERYDAY: Weigh yourself in the morning before breakfast. Write it down and keep it in a log. Take your medicines as prescribed Eat low salt foods--Limit salt (sodium) to 2000 mg per day.  Stay as active as you can everyday Limit all fluids for the day to less than 2 liters  At the [facility], you and your health needs are our priority. As part of our continuing mission to provide you with exceptional heart care, we have created designated Provider Care Teams. These Care Teams include your primary Cardiologist (physician) and Advanced Practice Providers (APPs- Physician Assistants and Nurse Practitioners) who all work together to provide you with the care you need, when you need it.   You may see any of the following providers on your designated Care Team at your next follow up: Dr Daniel Bensimhon Dr Dalton McLean Amy Clegg, NP Brittainy Simmons, PA Jessica Milford,NP Lindsay Finch, PA Lauren Kemp, PharmD   Please be sure to bring in all your medications bottles to every appointment.   If you have any questions or concerns before your next appointment please send us a message through mychart or call our office at [phone].    TO LEAVE A MESSAGE FOR THE NURSE SELECT OPTION 2, PLEASE LEAVE A MESSAGE INCLUDING: YOUR NAME DATE OF BIRTH CALL BACK NUMBER REASON FOR CALL**this is important as we prioritize the call backs  YOU WILL RECEIVE A CALL BACK THE SAME DAY AS LONG AS YOU CALL BEFORE 4:00 PM  

## 2022-03-09 NOTE — Progress Notes (Signed)
[facility] PREP Weekly Session  Patient Details  Name: Richard Graham MRN: [id number] Date of Birth: Apr 07, 1956 Age: 66 y.o. PCP: [name], DO  Vitals:     [facility] Weekly seesion - 03/09/22 1700       [facility] "PREP" Location   [facility]      Weekly Session   Topic Discussed Healthy eating tips    Minutes exercised this week 210 minutes    Classes attended to date 5             Bonnye Fava 03/09/2022, 5:39 PM

## 2022-03-12 DIAGNOSIS — R82998 Other abnormal findings in urine: Secondary | ICD-10-CM | POA: Diagnosis not present

## 2022-03-12 DIAGNOSIS — D6869 Other thrombophilia: Secondary | ICD-10-CM | POA: Diagnosis not present

## 2022-03-12 DIAGNOSIS — Z23 Encounter for immunization: Secondary | ICD-10-CM | POA: Diagnosis not present

## 2022-03-12 DIAGNOSIS — R7303 Prediabetes: Secondary | ICD-10-CM | POA: Diagnosis not present

## 2022-03-12 DIAGNOSIS — I4819 Other persistent atrial fibrillation: Secondary | ICD-10-CM | POA: Diagnosis not present

## 2022-03-12 DIAGNOSIS — M109 Gout, unspecified: Secondary | ICD-10-CM | POA: Diagnosis not present

## 2022-03-17 NOTE — Progress Notes (Signed)
[facility] PREP Weekly Session  Patient Details  Name: Richard Graham MRN: [id number] Date of Birth: 10-25-1955 Age: 66 y.o. PCP: [name], DO  Vitals:   03/15/22 1430  Weight: 265 lb (120.2 kg)     [facility] Weekly seesion - 03/17/22 1200       [facility] "PREP" Location   [facility]      Weekly Session   Topic Discussed Health habits    Minutes exercised this week 420 minutes    Classes attended to date 7             Bonnye Fava 03/17/2022, 12:27 PM

## 2022-03-18 ENCOUNTER — Ambulatory Visit (INDEPENDENT_AMBULATORY_CARE_PROVIDER_SITE_OTHER): Payer: BC Managed Care – PPO

## 2022-03-18 DIAGNOSIS — I5022 Chronic systolic (congestive) heart failure: Secondary | ICD-10-CM | POA: Diagnosis not present

## 2022-03-18 LAB — CUP PACEART REMOTE DEVICE CHECK
Battery Remaining Longevity: 98 mo
Battery Remaining Percentage: 81 %
Battery Voltage: 2.98 V
Brady Statistic RV Percent Paced: 1 %
Date Time Interrogation Session: 20230803020355
HighPow Impedance: 79 Ohm
Implantable Lead Implant Date: 20210805
Implantable Lead Location: 753860
Implantable Pulse Generator Implant Date: 20210805
Lead Channel Impedance Value: 410 Ohm
Lead Channel Pacing Threshold Amplitude: 1 V
Lead Channel Pacing Threshold Pulse Width: 0.5 ms
Lead Channel Sensing Intrinsic Amplitude: 11.7 mV
Lead Channel Setting Pacing Amplitude: 2.5 V
Lead Channel Setting Pacing Pulse Width: 0.5 ms
Lead Channel Setting Sensing Sensitivity: 0.5 mV
Pulse Gen Serial Number: 111025604

## 2022-03-25 DIAGNOSIS — G5603 Carpal tunnel syndrome, bilateral upper limbs: Secondary | ICD-10-CM | POA: Diagnosis not present

## 2022-03-25 DIAGNOSIS — M7020 Olecranon bursitis, unspecified elbow: Secondary | ICD-10-CM | POA: Diagnosis not present

## 2022-03-25 DIAGNOSIS — R2233 Localized swelling, mass and lump, upper limb, bilateral: Secondary | ICD-10-CM | POA: Diagnosis not present

## 2022-03-30 NOTE — Progress Notes (Signed)
[facility] PREP Weekly Session  Patient Details  Name: Richard Graham MRN: [id number] Date of Birth: 05/10/1956 Age: 66 y.o. PCP: [name], DO  Vitals:   03/29/22 1430  Weight: 268 lb (121.6 kg)     [facility] Weekly seesion - 03/30/22 1700       [facility] "PREP" Location   [facility]      Weekly Session   Topic Discussed Stress management and problem solving    Minutes exercised this week 420 minutes    Classes attended to date 10             Bonnye Fava 03/30/2022, 5:26 PM

## 2022-03-31 ENCOUNTER — Telehealth (HOSPITAL_COMMUNITY): Payer: Self-pay

## 2022-03-31 NOTE — Telephone Encounter (Signed)
Surgical clearance forms faxed on 03/31/22  Per Dr. Shirlee Latch " ok to hold Eliquis 2 days prior"

## 2022-04-09 NOTE — Progress Notes (Signed)
Remote ICD transmission.   

## 2022-04-12 NOTE — Progress Notes (Signed)
[facility] PREP Weekly Session  Patient Details  Name: EARMON SHERROW MRN: [id number] Date of Birth: August 01, 1956 Age: 66 y.o. PCP: [name], DO  Vitals:   04/12/22 1541  Weight: 268 lb (121.6 kg)     [facility] Weekly seesion - 04/12/22 1500       [facility] "PREP" Location   [facility] "PREP" Location [facility]      Weekly Session   Topic Discussed Expectations and non-scale victories    Minutes exercised this week 510 minutes    Classes attended to date 13             Bonnye Fava 04/12/2022, 3:42 PM

## 2022-04-15 ENCOUNTER — Other Ambulatory Visit: Payer: Self-pay

## 2022-04-15 ENCOUNTER — Encounter (HOSPITAL_COMMUNITY): Payer: Self-pay | Admitting: Orthopedic Surgery

## 2022-04-15 NOTE — Anesthesia Preprocedure Evaluation (Addendum)
Anesthesia Evaluation  Patient identified by MRN, date of birth, ID band Patient awake    Reviewed: Allergy & Precautions, Patient's Chart, lab work & pertinent test results, reviewed documented beta blocker date and time   Airway Mallampati: III       Dental no notable dental hx.    Pulmonary sleep apnea and Continuous Positive Airway Pressure Ventilation , former smoker,  Quit smoking 2013   Pulmonary exam normal        Cardiovascular hypertension, Pt. on medications and Pt. on home beta blockers + CAD and +CHF (LVEF 35-40%, grade 1 diastolic dysfunction)  + dysrhythmias (eliquis) Atrial Fibrillation + pacemaker (last checked 10/2021) + Cardiac Defibrillator  Rhythm:Regular Rate:Normal  Echo 2022 1. Septal and apical akinesis no mural apical thrombus Inferior apical  dyskinesis EF and RWMAls similar to echo done 04/22/21. Left ventricular  ejection fraction, by estimation, is 35 to 40%. The left ventricle has  moderately decreased function. The left  ventricle demonstrates regional wall motion abnormalities (see scoring  diagram/findings for description). The left ventricular internal cavity  size was mildly dilated. There is mild asymmetric left ventricular  hypertrophy of the basal and septal  segments. Left ventricular diastolic parameters are consistent with Grade  I diastolic dysfunction (impaired relaxation).  2. AICD wires in RA/RV. Right ventricular systolic function is normal.  The right ventricular size is normal. There is normal pulmonary artery  systolic pressure.  3. The mitral valve is normal in structure. No evidence of mitral valve  regurgitation. No evidence of mitral stenosis.  4. The aortic valve is normal in structure. Aortic valve regurgitation is  not visualized. No aortic stenosis is present.  5. The inferior vena cava is normal in size with greater than 50%  respiratory variability, suggesting right  atrial pressure of 3 mmHg.    Neuro/Psych MCA aneurysm  negative neurological ROS  negative psych ROS   GI/Hepatic negative GI ROS, Neg liver ROS,   Endo/Other  diabetes (pre-diabetic), Type 2, Oral Hypoglycemic AgentsBMI 36  Renal/GU negative Renal ROS  negative genitourinary   Musculoskeletal negative musculoskeletal ROS (+)   Abdominal Normal abdominal exam  (+)   Peds  Hematology negative hematology ROS (+)   Anesthesia Other Findings   Reproductive/Obstetrics negative OB ROS                           Anesthesia Physical Anesthesia Plan  ASA: 3  Anesthesia Plan: General   Post-op Pain Management:    Induction: Intravenous  PONV Risk Score and Plan: 2 and Ondansetron, Dexamethasone and Treatment may vary due to age or medical condition  Airway Management Planned: Mask and LMA  Additional Equipment: None  Intra-op Plan:   Post-operative Plan: Extubation in OR  Informed Consent: I have reviewed the patients History and Physical, chart, labs and discussed the procedure including the risks, benefits and alternatives for the proposed anesthesia with the patient or authorized representative who has indicated his/her understanding and acceptance.     Dental advisory given  Plan Discussed with: CRNA  Anesthesia Plan Comments: (PAT note written 04/15/2022 by Shonna Chock, PA-C. )      Anesthesia Quick Evaluation

## 2022-04-15 NOTE — Progress Notes (Addendum)
PCP - [name], DO HF Cardiologist - Dr Marca Ancona EP Cardiologist - Dr [name]  Chest x-ray - n/a EKG - 10/27/21 Stress Test - 07/01/20 ECHO - 07/23/21 Cardiac Cath - 11/12/19  ICD Pacemaker - Yes, St Jude/[employer], Last remote check was on 03/18/22.   Rep Kerry Fort notified of surgical date, start time & procedure.  Perioperative Prescription for ICD Programming was initiated, awaiting results.    Sleep Study -  Yes CPAP - uses nightly  Do not take Jardiance on the morning of surgery.  Patient does not check blood sugars.  Blood Thinner Instructions:  Dr. Shirlee Latch signed a note of cardiac clearance for procedure with permission to hold Eliquis for 2 days prior. (See Media tab.)  ERAS: Clear liquids til 2:30 PM DOS  Anesthesia review: Yes  STOP now taking any Aspirin (unless otherwise instructed by your surgeon), Aleve, Naproxen, Ibuprofen, Motrin, Advil, Goody's, BC's, all herbal medications, fish oil, and all vitamins.   Coronavirus Screening Do you have any of the following symptoms:  Cough yes/no: No Fever (>100.18F)  yes/no: No Runny nose yes/no: No Sore throat yes/no: No Difficulty breathing/shortness of breath  yes/no: No  Have you traveled in the last 14 days and where? yes/no: No  Patient verbalized understanding of instructions that were given via phone.

## 2022-04-15 NOTE — Progress Notes (Signed)
Anesthesia Chart Review: Maury Dus  Case: [id number] Date/Time: 04/20/22 1715   Procedures:      Left carpal tunnel release.  Left elbow mass excision with bursectomy and in situ ulnar nerve release. (Left) - 90 mins     EXCISION MASS (Left)   Anesthesia type: General   Pre-op diagnosis: Left elbow bursitis with advanced gouty tophaceous mass.  Left carpal tunnel syndrome   [facility] / [facility]   Surgeons: Dominica Severin, MD       DISCUSSION: Patient is a 66 year old male scheduled for the above procedure.  History includes former smoker (quit 08/17/11), CAD, (occluded LAD with collaterals, no significant LAD territory viability on cMRI, so treated medically 10/2019), chronic systolic CHF, PAF (diagnosed 10/2019; s/p DCCV 12/28/19), ICD (St. Jude/Abbott Gallant VR [id number] ICD 03/20/20), HLD, pre-diabetes, OSA, right MCA cerebral aneurysm (s/p embolization 11/22/19), ventral hernia (s/p repair 06/27/15), obesity.  Last cardiology visit was on 03/09/22 with Prince Rome, NP at the [facility]. No chest pain, continue medical management of CAD. Has chronic exertional fatigue and dyspnea. On Eliquis and amiodarone for afib. In 2021, Dr. Ladona Ridgel recommended weight loss before considering afib ablation. EF stable at 35-40% 07/2021 (previously 20-25% in 2021). CPX in 06/2020 showed minimal HF limitation, consider repeating in the future. Volume state okay. Continue Entresto, Lasix, spironolactone, Jardiance, Toprol, Crestor. Okay to stop ASA if okay with neuro IR. 4 month follow-up planned.    Dr. Shirlee Latch signed a note of cardiac clearance for procedure with permission to hold Eliquis for 2 days prior. (See Media tab.)  Awaiting EP perioperative ICD recommendations.   Anesthesia team to evaluate on the day of surgery.   VS:  BP Readings from Last 3 Encounters:  03/09/22 100/66  02/22/22 100/76  11/03/21 96/78   Pulse Readings from Last 3 Encounters:  03/09/22 84  02/22/22 88   11/03/21 81     PROVIDERS: [name], DO is PCP  Armanda Magic, MD is cardiologist ([facility]). Primary seeing HF and EP since 05/2020. Marca Ancona, MD is HF cardiologist [name], MD is EP cardiologist. Last visit 10/19/21. He was still having some breakthrough A-Flutter. ICD function okay.    LABS: On day of surgery as indicated. Last results in [facility] Everywhere include: On 03/13/22, glucose 110, BUN 24, creatinine 1.0, sodium 136, potassium 4.6, albumin 4.6, AST 20, ALT 21, total bilirubin 1.0, WBC 8.47, hemoglobin 17.0, hematocrit 50.9, platelet count 207, A1c 6.2%.   Home Sleep Study 05/21/20: IMPRESSIONS - Severe obstructive sleep apnea occurred during this study (AHI = 32.8/h). - No significant central sleep apnea occurred during this study (CAI = 0.0/h). - Severe oxygen desaturation was noted during this study (Min O2 = 66%). - Patient snored 22.6% during the sleep.  RECOMMENDATIONS - Recommend ResMed auto CPAP from 4-20cm H2O with heated humidity and mask of choice...   EKG: 10/27/21: Normal sinus rhythm Left axis deviation Right bundle branch block Anteroseptal infarct (cited on or before 20-Dec-2019) Abnormal ECG When compared with ECG of 22-Jul-2021 11:41, Questionable change in initial forces of Anterior leads since last tracing no significant change Confirmed by Lance Muss [phone]) on 10/27/2021 8:48:59 PM   CV: Echo 07/23/21: IMPRESSIONS   1. Septal and apical akinesis no mural apical thrombus Inferior apical  dyskinesis EF and RWMAls similar to echo done 04/22/21. Left ventricular  ejection fraction, by estimation, is 35 to 40%. The left ventricle has  moderately decreased function. The left  ventricle demonstrates regional wall motion abnormalities (see scoring  diagram/findings for description). The left ventricular internal cavity  size was mildly dilated. There is mild asymmetric left ventricular  hypertrophy of the basal and septal   segments. Left ventricular diastolic parameters are consistent with Grade  I diastolic dysfunction (impaired relaxation).   2. AICD wires in RA/RV. Right ventricular systolic function is normal.  The right ventricular size is normal. There is normal pulmonary artery  systolic pressure.   3. The mitral valve is normal in structure. No evidence of mitral valve  regurgitation. No evidence of mitral stenosis.   4. The aortic valve is normal in structure. Aortic valve regurgitation is  not visualized. No aortic stenosis is present.   5. The inferior vena cava is normal in size with greater than 50%  respiratory variability, suggesting right atrial pressure of 3 mmHg.  - Comparison 04/22/21 EF 35-40%, apical septal, apical lateral, and apical anterior wall, and true apex akinesis, aneurysmal apical inferior wall, grade I DD, mildly reduced RVSF; 01/25/20 LVEF 20-25%, 11/06/19 LVEF 20-25%.   CPX 07/01/20: Conclusion: Exercise testing with gas exchange demonstrates low-normal functional capacity when compared to matched sedentary norms. There is no clear cardiopulmonary abnormality or HF limitation. At peak exercise, patient appears deconditioned and limited due to body habitus and is supported with restrictive lung patterns demonstrated in pre-exercise spirometry. Note, there was mild chronotropic incompetence.    MRI Cardiac 12/18/19: IMPRESSION: 1. Severely dilated left ventricle with mild concentric hypertrophy and severely impaired systolic function (LVEF = 22%). There is apical aneurysm involving all of the apical segments and mid anterior wall. There is paradoxical septal motion. Late gadolinium enhancement is seen in the mid anteroseptal wall (25-50% transmurality with good chance of recovery), mid anterior, apical septal, anterior, inferior walls and in the true apex (75-100% transmurality with poor chance of recovery), apical lateral wall (25-50%). 2. Normal right ventricular size,  thickness and moderately decreased systolic function (LVEF = 31%). There are no regional wall motion abnormalities. 3. Severely dilated left atrium (54 mm) and mildly dilated right atrium. 4. Mild mitral and tricuspid regurgitation. - These findings are consistent with ischemic cardiomyopathy with severe LV systolic dysfunction and no viability in most of the segments supplied by mid and apical LAD (mid anterior and apical septal, anterior, interior walls and in the true apex - total of 5 segments).   R/L heart cath 11/12/19: Mid LAD lesion is 100% stenosed. Chronically occluded with left to left collaterals. LV end diastolic pressure is mildly elevated. There is severe left ventricular systolic dysfunction. The left ventricular ejection fraction is less than 25% by visual estimate. There is no aortic valve stenosis. Hemodynamic findings consistent with mild pulmonary hypertension.   Consider viability study of anterior wall.  If viable, would plan for CTO PCI of LAD.  If no chance of recovery, then medical therapy without revascularization.   Past Medical History:  Diagnosis Date   CAD (coronary artery disease), native coronary artery    chronically occluded mid LAD with left to left collaterals.    Cerebral aneurysm    s/p embolization   Chronic systolic (congestive) heart failure (HCC)    biventricular HF with moderate RV dysfunction and severe LV dysfunction by Cardiac MRI with EF 22% no viability in the apical septal/apical/inferior,mid anterior and true apex.     Gout    HLD (hyperlipidemia)    PAF (paroxysmal atrial fibrillation) (HCC)    s/p DCCV 12/2019   Pre-diabetes  diet controlled, no meds   Sleep apnea     Past Surgical History:  Procedure Laterality Date   CARDIAC CATHETERIZATION     CARDIOVERSION N/A 12/28/2019   Procedure: CARDIOVERSION;  Surgeon: Chilton Si, MD;  Location: [facility] ENDOSCOPY;  Service: Cardiovascular;  Laterality: N/A;   COLONOSCOPY      ICD IMPLANT N/A 03/20/2020   Procedure: ICD IMPLANT;  Surgeon: Marinus Maw, MD;  [facility]: [facility] INVASIVE CV LAB;  Service: Cardiovascular;  Laterality: N/A;   INSERTION OF MESH N/A 06/27/2015   Procedure: INSERTION OF MESH;  Surgeon: Axel Filler, MD;  Location: [facility] ORS;  Service: General;  Laterality: N/A;   IR 3D INDEPENDENT WKST  11/14/2019   IR 3D INDEPENDENT WKST  11/22/2019   IR ANGIO INTRA EXTRACRAN SEL COM CAROTID INNOMINATE BILAT MOD SED  11/14/2019   IR ANGIO INTRA EXTRACRAN SEL INTERNAL CAROTID UNI R MOD SED  11/22/2019   IR ANGIO VERTEBRAL SEL VERTEBRAL BILAT MOD SED  11/14/2019   IR ANGIOGRAM FOLLOW UP STUDY  11/22/2019   IR CT HEAD LTD  11/22/2019   IR NEURO EACH ADD'L AFTER BASIC UNI RIGHT (MS)  11/22/2019   IR TRANSCATH/EMBOLIZ  11/22/2019   laceration to fingers     left hand   MOUTH SURGERY     secondary to abscess    RADIOLOGY WITH ANESTHESIA N/A 11/22/2019   Procedure: EMBOLIZATION;  Surgeon: Julieanne Cotton, MD;  Location: [facility] OR;  Service: Radiology;  Laterality: N/A;   RIGHT/LEFT HEART CATH AND CORONARY ANGIOGRAPHY N/A 11/12/2019   Procedure: RIGHT/LEFT HEART CATH AND CORONARY ANGIOGRAPHY;  Surgeon: Corky Crafts, MD;  Location: [facility] INVASIVE CV LAB;  Service: Cardiovascular;  Laterality: N/A;   trauma to lip      sutured   VENTRAL HERNIA REPAIR N/A 06/27/2015   Procedure: LAPAROSCOPIC VENTRAL HERNIA;  Surgeon: Axel Filler, MD;  Location: [facility] ORS;  Service: General;  Laterality: N/A;    MEDICATIONS: No current facility-administered medications for this encounter.    amiodarone (PACERONE) 200 MG tablet   apixaban (ELIQUIS) 5 MG TABS tablet   Ascorbic Acid (VITAMIN C) 1000 MG tablet   colchicine 0.6 MG tablet   Cyanocobalamin (VITAMIN B-12 PO)   diclofenac sodium (VOLTAREN) 1 % GEL   empagliflozin (JARDIANCE) 10 MG TABS tablet   furosemide (LASIX) 20 MG tablet   metoprolol succinate (TOPROL-XL) 100 MG 24 hr tablet   Multiple Vitamins-Minerals (MULTIVITAMIN WITH  MINERALS) tablet   oxyCODONE-acetaminophen (PERCOCET/ROXICET) 5-325 MG tablet   rosuvastatin (CRESTOR) 5 MG tablet   sacubitril-valsartan (ENTRESTO) 49-51 MG   spironolactone (ALDACTONE) 25 MG tablet   tadalafil (CIALIS) 20 MG tablet   zinc gluconate 50 MG tablet   sertraline (ZOLOFT) 25 MG tablet    Shonna Chock, PA-C Surgical Short Stay/Anesthesiology [facility] Phone [phone] [facility] Phone [phone] 04/15/2022 12:22 PM

## 2022-04-16 ENCOUNTER — Encounter: Payer: Self-pay | Admitting: Internal Medicine

## 2022-04-16 NOTE — Progress Notes (Signed)
PERIOPERATIVE PRESCRIPTION FOR IMPLANTED CARDIAC DEVICE PROGRAMMING  Patient Information: Name:  Richard Graham  DOB:  12-09-1955  MRN:  [id number]    Planned Procedure:  Left carpal tunnel release.  Left elbow mass excision with bursectomy and in situ ulnar nerve release.  Surgeon:  Dr Dominica Severin  Date of Procedure:  04/20/22  Cautery will be used.  Position during surgery:  supine   Please send documentation back to:  [facility] (Fax # [phone])  Device Information:  Clinic EP Physician:  [name], MD   Device Type:  Defibrillator Manufacturer and Phone #:  St. Jude/Abbott: [phone] Pacemaker Dependent?:  No. Date of Last Device Check:  03/18/22 Normal Device Function?:  Yes.    Electrophysiologist's Recommendations:  Have magnet available. Provide continuous ECG monitoring when magnet is used or reprogramming is to be performed.  Procedure may interfere with device function.  Magnet should be placed over device during procedure.  Per Device Clinic Standing Orders, Lenor Coffin, RN  8:45 AM 04/16/2022

## 2022-04-20 ENCOUNTER — Ambulatory Visit (HOSPITAL_BASED_OUTPATIENT_CLINIC_OR_DEPARTMENT_OTHER): Payer: Medicare Other | Admitting: Vascular Surgery

## 2022-04-20 ENCOUNTER — Encounter (HOSPITAL_COMMUNITY)
Admission: RE | Disposition: A | Discharge status: Home / Self Care | Payer: Self-pay | Source: Home / Self Care | Attending: Orthopedic Surgery

## 2022-04-20 ENCOUNTER — Ambulatory Visit (HOSPITAL_COMMUNITY): Payer: Medicare Other | Admitting: Vascular Surgery

## 2022-04-20 ENCOUNTER — Ambulatory Visit (HOSPITAL_COMMUNITY)
Admission: RE | Admit: 2022-04-20 | Discharge: 2022-04-20 | Disposition: A | Discharge status: Home / Self Care | Payer: Medicare Other | Attending: Orthopedic Surgery | Admitting: Orthopedic Surgery

## 2022-04-20 ENCOUNTER — Encounter (HOSPITAL_COMMUNITY): Payer: Self-pay | Admitting: Orthopedic Surgery

## 2022-04-20 DIAGNOSIS — I671 Cerebral aneurysm, nonruptured: Secondary | ICD-10-CM | POA: Diagnosis not present

## 2022-04-20 DIAGNOSIS — I2510 Atherosclerotic heart disease of native coronary artery without angina pectoris: Secondary | ICD-10-CM | POA: Insufficient documentation

## 2022-04-20 DIAGNOSIS — I5022 Chronic systolic (congestive) heart failure: Secondary | ICD-10-CM

## 2022-04-20 DIAGNOSIS — E785 Hyperlipidemia, unspecified: Secondary | ICD-10-CM | POA: Insufficient documentation

## 2022-04-20 DIAGNOSIS — G4733 Obstructive sleep apnea (adult) (pediatric): Secondary | ICD-10-CM | POA: Insufficient documentation

## 2022-04-20 DIAGNOSIS — R2232 Localized swelling, mass and lump, left upper limb: Secondary | ICD-10-CM | POA: Diagnosis not present

## 2022-04-20 DIAGNOSIS — G5622 Lesion of ulnar nerve, left upper limb: Secondary | ICD-10-CM | POA: Diagnosis not present

## 2022-04-20 DIAGNOSIS — Z87891 Personal history of nicotine dependence: Secondary | ICD-10-CM | POA: Insufficient documentation

## 2022-04-20 DIAGNOSIS — E119 Type 2 diabetes mellitus without complications: Secondary | ICD-10-CM | POA: Insufficient documentation

## 2022-04-20 DIAGNOSIS — Z6835 Body mass index (BMI) 35.0-35.9, adult: Secondary | ICD-10-CM | POA: Insufficient documentation

## 2022-04-20 DIAGNOSIS — M7032 Other bursitis of elbow, left elbow: Secondary | ICD-10-CM | POA: Diagnosis not present

## 2022-04-20 DIAGNOSIS — I110 Hypertensive heart disease with heart failure: Secondary | ICD-10-CM

## 2022-04-20 DIAGNOSIS — G5602 Carpal tunnel syndrome, left upper limb: Secondary | ICD-10-CM | POA: Insufficient documentation

## 2022-04-20 DIAGNOSIS — Z7901 Long term (current) use of anticoagulants: Secondary | ICD-10-CM | POA: Diagnosis not present

## 2022-04-20 DIAGNOSIS — I480 Paroxysmal atrial fibrillation: Secondary | ICD-10-CM | POA: Insufficient documentation

## 2022-04-20 DIAGNOSIS — Z7984 Long term (current) use of oral hypoglycemic drugs: Secondary | ICD-10-CM | POA: Diagnosis not present

## 2022-04-20 DIAGNOSIS — Z79899 Other long term (current) drug therapy: Secondary | ICD-10-CM | POA: Insufficient documentation

## 2022-04-20 DIAGNOSIS — Z95810 Presence of automatic (implantable) cardiac defibrillator: Secondary | ICD-10-CM | POA: Insufficient documentation

## 2022-04-20 DIAGNOSIS — E669 Obesity, unspecified: Secondary | ICD-10-CM | POA: Insufficient documentation

## 2022-04-20 DIAGNOSIS — M1A9XX1 Chronic gout, unspecified, with tophus (tophi): Secondary | ICD-10-CM | POA: Diagnosis not present

## 2022-04-20 HISTORY — PX: CARPAL TUNNEL RELEASE: SHX101

## 2022-04-20 HISTORY — PX: MASS EXCISION: SHX2000

## 2022-04-20 HISTORY — DX: Type 2 diabetes mellitus without complications: E11.9

## 2022-04-20 HISTORY — DX: Sleep apnea, unspecified: G47.30

## 2022-04-20 HISTORY — DX: Presence of cardiac pacemaker: Z95.0

## 2022-04-20 HISTORY — DX: Presence of automatic (implantable) cardiac defibrillator: Z95.810

## 2022-04-20 LAB — GLUCOSE, CAPILLARY
Glucose-Capillary: 95 mg/dL (ref 70–99)
Glucose-Capillary: 87 mg/dL (ref 70–99)

## 2022-04-20 SURGERY — CARPAL TUNNEL RELEASE
Anesthesia: General | Site: Elbow | Laterality: Left

## 2022-04-20 MED ORDER — FENTANYL CITRATE (PF) 250 MCG/5ML IJ SOLN
INTRAMUSCULAR | Status: AC
  Filled 2022-04-20: qty 5

## 2022-04-20 MED ORDER — ONDANSETRON HCL 4 MG/2ML IJ SOLN
INTRAMUSCULAR | Status: DC | PRN
  Administered 2022-04-20: 4 mg via INTRAVENOUS

## 2022-04-20 MED ORDER — DEXAMETHASONE SODIUM PHOSPHATE 10 MG/ML IJ SOLN
INTRAMUSCULAR | Status: AC
  Filled 2022-04-20: qty 1

## 2022-04-20 MED ORDER — BUPIVACAINE HCL (PF) 0.25 % IJ SOLN
INTRAMUSCULAR | Status: AC
Start: 2022-04-20 — End: ?
  Filled 2022-04-20: qty 60

## 2022-04-20 MED ORDER — LIDOCAINE HCL (CARDIAC) PF 100 MG/5ML IV SOSY
PREFILLED_SYRINGE | INTRAVENOUS | Status: DC | PRN
  Administered 2022-04-20: 80 mg via INTRAVENOUS

## 2022-04-20 MED ORDER — PHENYLEPHRINE HCL (PRESSORS) 10 MG/ML IV SOLN
INTRAVENOUS | Status: DC | PRN
  Administered 2022-04-20 (×2): 160 ug via INTRAVENOUS
  Administered 2022-04-20: 80 ug via INTRAVENOUS
  Administered 2022-04-20 (×2): 160 ug via INTRAVENOUS
  Administered 2022-04-20: 80 ug via INTRAVENOUS
  Administered 2022-04-20: 160 ug via INTRAVENOUS
  Administered 2022-04-20: 80 ug via INTRAVENOUS
  Administered 2022-04-20: 160 ug via INTRAVENOUS
  Administered 2022-04-20 (×2): 80 ug via INTRAVENOUS

## 2022-04-20 MED ORDER — CEFAZOLIN IN SODIUM CHLORIDE 3-0.9 GM/100ML-% IV SOLN
INTRAVENOUS | Status: AC
  Filled 2022-04-20: qty 100

## 2022-04-20 MED ORDER — OXYCODONE HCL 5 MG PO TABS
ORAL_TABLET | ORAL | Status: AC
  Filled 2022-04-20: qty 1

## 2022-04-20 MED ORDER — ACETAMINOPHEN 500 MG PO TABS
ORAL_TABLET | ORAL | Status: AC
  Administered 2022-04-20: 500 mg via ORAL
  Filled 2022-04-20: qty 2

## 2022-04-20 MED ORDER — HYDROMORPHONE HCL 1 MG/ML IJ SOLN
INTRAMUSCULAR | Status: AC
  Filled 2022-04-20: qty 1

## 2022-04-20 MED ORDER — CEFAZOLIN IN SODIUM CHLORIDE 3-0.9 GM/100ML-% IV SOLN
3.0000 g | INTRAVENOUS | Status: AC
  Administered 2022-04-20: 3 g via INTRAVENOUS

## 2022-04-20 MED ORDER — ONDANSETRON HCL 4 MG/2ML IJ SOLN
INTRAMUSCULAR | Status: AC
  Filled 2022-04-20: qty 2

## 2022-04-20 MED ORDER — PROPOFOL 10 MG/ML IV BOLUS
INTRAVENOUS | Status: DC | PRN
  Administered 2022-04-20: 30 mg via INTRAVENOUS
  Administered 2022-04-20: 120 mg via INTRAVENOUS

## 2022-04-20 MED ORDER — OXYCODONE HCL 5 MG/5ML PO SOLN: 5.0000 mg | Freq: Once | ORAL | Status: AC | PRN

## 2022-04-20 MED ORDER — 0.9 % SODIUM CHLORIDE (POUR BTL) OPTIME
TOPICAL | Status: DC | PRN
  Administered 2022-04-20: 1000 mL

## 2022-04-20 MED ORDER — PHENYLEPHRINE HCL-NACL 20-0.9 MG/250ML-% IV SOLN
INTRAVENOUS | Status: DC | PRN
  Administered 2022-04-20: 40 ug/min via INTRAVENOUS

## 2022-04-20 MED ORDER — PROPOFOL 10 MG/ML IV BOLUS
INTRAVENOUS | Status: AC
  Filled 2022-04-20: qty 20

## 2022-04-20 MED ORDER — CHLORHEXIDINE GLUCONATE 0.12 % MT SOLN: 15.0000 mL | Freq: Once | OROMUCOSAL | Status: AC

## 2022-04-20 MED ORDER — FENTANYL CITRATE (PF) 250 MCG/5ML IJ SOLN
INTRAMUSCULAR | Status: DC | PRN
  Administered 2022-04-20 (×5): 25 ug via INTRAVENOUS
  Administered 2022-04-20: 100 ug via INTRAVENOUS
  Administered 2022-04-20: 25 ug via INTRAVENOUS

## 2022-04-20 MED ORDER — CHLORHEXIDINE GLUCONATE 0.12 % MT SOLN
OROMUCOSAL | Status: AC
  Administered 2022-04-20: 15 mL via OROMUCOSAL
  Filled 2022-04-20: qty 15

## 2022-04-20 MED ORDER — ACETAMINOPHEN 500 MG PO TABS
1000.0000 mg | ORAL_TABLET | Freq: Once | ORAL | Status: AC
Start: 2022-04-20 — End: 2022-04-20

## 2022-04-20 MED ORDER — HYDROMORPHONE HCL 1 MG/ML IJ SOLN
0.2500 mg | INTRAMUSCULAR | Status: DC | PRN
  Administered 2022-04-20 (×3): 0.5 mg via INTRAVENOUS

## 2022-04-20 MED ORDER — BUPIVACAINE HCL (PF) 0.25 % IJ SOLN
INTRAMUSCULAR | Status: DC | PRN
  Administered 2022-04-20: 6 mL

## 2022-04-20 MED ORDER — ONDANSETRON HCL 4 MG/2ML IJ SOLN: 4.0000 mg | Freq: Once | INTRAMUSCULAR | Status: DC | PRN

## 2022-04-20 MED ORDER — LIDOCAINE 2% (20 MG/ML) 5 ML SYRINGE
INTRAMUSCULAR | Status: AC
  Filled 2022-04-20: qty 5

## 2022-04-20 MED ORDER — DEXAMETHASONE SODIUM PHOSPHATE 10 MG/ML IJ SOLN
INTRAMUSCULAR | Status: DC | PRN
  Administered 2022-04-20: 10 mg via INTRAVENOUS

## 2022-04-20 MED ORDER — LACTATED RINGERS IV SOLN: INTRAVENOUS | Status: DC

## 2022-04-20 MED ORDER — ORAL CARE MOUTH RINSE: 15.0000 mL | Freq: Once | OROMUCOSAL | Status: AC

## 2022-04-20 MED ORDER — AMISULPRIDE (ANTIEMETIC) 5 MG/2ML IV SOLN: 10.0000 mg | Freq: Once | INTRAVENOUS | Status: DC | PRN

## 2022-04-20 MED ORDER — OXYCODONE HCL 5 MG PO TABS
5.0000 mg | ORAL_TABLET | Freq: Once | ORAL | Status: AC | PRN
  Administered 2022-04-20: 5 mg via ORAL

## 2022-04-20 SURGICAL SUPPLY — 54 items
BAG COUNTER SPONGE SURGICOUNT (BAG) ×2 IMPLANT
BAG SPNG CNTER NS LX DISP (BAG) ×1
BNDG ELASTIC 3X5.8 VLCR STR LF (GAUZE/BANDAGES/DRESSINGS) ×4 IMPLANT
BNDG ELASTIC 4X5.8 VLCR STR LF (GAUZE/BANDAGES/DRESSINGS) ×2 IMPLANT
BNDG GAUZE DERMACEA FLUFF 4 (GAUZE/BANDAGES/DRESSINGS) ×6 IMPLANT
BNDG GZE DERMACEA 4 6PLY (GAUZE/BANDAGES/DRESSINGS) ×4
CORD BIPOLAR FORCEPS 12FT (ELECTRODE) ×2 IMPLANT
COVER SURGICAL LIGHT HANDLE (MISCELLANEOUS) ×2 IMPLANT
CUFF TOURN SGL QUICK 18X4 (TOURNIQUET CUFF) ×2 IMPLANT
CUFF TOURN SGL QUICK 24 (TOURNIQUET CUFF)
CUFF TRNQT CYL 24X4X16.5-23 (TOURNIQUET CUFF) IMPLANT
DRAPE OEC MINIVIEW 54X84 (DRAPES) IMPLANT
DRAPE SURG 17X23 STRL (DRAPES) ×2 IMPLANT
DRSG ADAPTIC 3X8 NADH LF (GAUZE/BANDAGES/DRESSINGS) IMPLANT
EVACUATOR 1/8 PVC DRAIN (DRAIN) IMPLANT
GAUZE SPONGE 4X4 12PLY STRL (GAUZE/BANDAGES/DRESSINGS) ×2 IMPLANT
GAUZE XEROFORM 1X8 LF (GAUZE/BANDAGES/DRESSINGS) ×2 IMPLANT
GAUZE XEROFORM 5X9 LF (GAUZE/BANDAGES/DRESSINGS) IMPLANT
GLOVE BIOGEL M 8.0 STRL (GLOVE) ×2 IMPLANT
GLOVE SS BIOGEL STRL SZ 8 (GLOVE) ×2 IMPLANT
GOWN STRL REUS W/ TWL LRG LVL3 (GOWN DISPOSABLE) ×4 IMPLANT
GOWN STRL REUS W/ TWL XL LVL3 (GOWN DISPOSABLE) ×6 IMPLANT
GOWN STRL REUS W/TWL LRG LVL3 (GOWN DISPOSABLE) ×4
GOWN STRL REUS W/TWL XL LVL3 (GOWN DISPOSABLE) ×6
KIT BASIN OR (CUSTOM PROCEDURE TRAY) ×2 IMPLANT
KIT TURNOVER KIT B (KITS) ×2 IMPLANT
LOOP VESSEL MAXI BLUE (MISCELLANEOUS) IMPLANT
MANIFOLD NEPTUNE II (INSTRUMENTS) ×2 IMPLANT
NDL HYPO 25GX1X1/2 BEV (NEEDLE) IMPLANT
NEEDLE HYPO 25GX1X1/2 BEV (NEEDLE) IMPLANT
NS IRRIG 1000ML POUR BTL (IV SOLUTION) ×2 IMPLANT
PACK ORTHO EXTREMITY (CUSTOM PROCEDURE TRAY) ×2 IMPLANT
PAD ARMBOARD 7.5X6 YLW CONV (MISCELLANEOUS) ×4 IMPLANT
PAD CAST 4YDX4 CTTN HI CHSV (CAST SUPPLIES) ×4 IMPLANT
PADDING CAST COTTON 4X4 STRL (CAST SUPPLIES) ×6
SOL PREP POV-IOD 4OZ 10% (MISCELLANEOUS) ×6 IMPLANT
SPECIMEN JAR SMALL (MISCELLANEOUS) ×2 IMPLANT
SPIKE FLUID TRANSFER (MISCELLANEOUS) ×2 IMPLANT
SPLINT FIBERGLASS 4X30 (CAST SUPPLIES) IMPLANT
SUCTION FRAZIER HANDLE 10FR (MISCELLANEOUS)
SUCTION TUBE FRAZIER 10FR DISP (MISCELLANEOUS) IMPLANT
SUT MERSILENE 4 0 P 3 (SUTURE) IMPLANT
SUT PROLENE 4 0 PS 2 18 (SUTURE) ×2 IMPLANT
SUT VIC AB 2-0 CT1 27 (SUTURE)
SUT VIC AB 2-0 CT1 TAPERPNT 27 (SUTURE) IMPLANT
SUT VIC AB 3-0 FS2 27 (SUTURE) IMPLANT
SYR CONTROL 10ML LL (SYRINGE) IMPLANT
SYSTEM CHEST DRAIN TLS 7FR (DRAIN) IMPLANT
TOWEL GREEN STERILE (TOWEL DISPOSABLE) ×2 IMPLANT
TOWEL GREEN STERILE FF (TOWEL DISPOSABLE) ×2 IMPLANT
TUBE CONNECTING 12X1/4 (SUCTIONS) IMPLANT
TUBE EVACUATION TLS (MISCELLANEOUS) ×2 IMPLANT
UNDERPAD 30X36 HEAVY ABSORB (UNDERPADS AND DIAPERS) ×2 IMPLANT
WATER STERILE IRR 1000ML POUR (IV SOLUTION) ×2 IMPLANT

## 2022-04-20 NOTE — Discharge Instructions (Signed)
You may begin your blood thinners on Thursday.  Please take colchicine once a day for 4 days postoperatively.  If you have emergencies Dr. Carlos Levering cell phone is [phone]  Please move and massage her fingers frequently.  Please call us for any problems.  We recommend that you to take vitamin C 1000 mg a day to promote healing. We also recommend that if you require  pain medicine that you take a stool softener to prevent constipation as most pain medicines will have constipation side effects. We recommend either Peri-Colace or Senokot and recommend that you also consider adding MiraLAX as well to prevent the constipation affects from pain medicine if you are required to use them. These medicines are over the counter and may be purchased at a local pharmacy. A cup of yogurt and a probiotic can also be helpful during the recovery process as the medicines can disrupt your intestinal environment. Keep bandage clean and dry.  Call for any problems.  No smoking.  Criteria for driving a car: you should be off your pain medicine for 7-8 hours, able to drive one handed(confident), thinking clearly and feeling able in your judgement to drive. Continue elevation as it will decrease swelling.  If instructed by MD move your fingers within the confines of the bandage/splint.  Use ice if instructed by your MD. Call immediately for any sudden loss of feeling in your hand/arm or change in functional abilities of the extremity.

## 2022-04-20 NOTE — H&P (Signed)
Richard Graham is an 66 y.o. male.   Chief Complaint: Presents for left CTR and mass excision at the elbow with CUTR  HPI: Patient presents for evaluation and treatment of the of their upper extremity predicament. The patient denies neck, back, chest or  abdominal pain. The patient notes that they have no lower extremity problems. The patients primary complaint is noted. We are planning surgical care pathway for the upper extremity.   Past Medical History:  Diagnosis Date   AICD (automatic cardioverter/defibrillator) present    CAD (coronary artery disease), native coronary artery    chronically occluded mid LAD with left to left collaterals.    Cerebral aneurysm    s/p embolization   Chronic systolic (congestive) heart failure (HCC)    biventricular HF with moderate RV dysfunction and severe LV dysfunction by Cardiac MRI with EF 22% no viability in the apical septal/apical/inferior,mid anterior and true apex.     Diabetes mellitus without complication (HCC)    on Jardiance   Gout    HLD (hyperlipidemia)    PAF (paroxysmal atrial fibrillation) (HCC)    s/p DCCV 12/2019   Pre-diabetes    diet controlled, no meds   Presence of permanent cardiac pacemaker    Gallant/Abbott Pacemaker, last remote check 10/19/21   Sleep apnea    use cpap nightly    Past Surgical History:  Procedure Laterality Date   CARDIAC CATHETERIZATION     CARDIOVERSION N/A 12/28/2019   Procedure: CARDIOVERSION;  Surgeon: Chilton Si, MD;  Location: [facility] ENDOSCOPY;  Service: Cardiovascular;  Laterality: N/A;   COLONOSCOPY     ICD IMPLANT N/A 03/20/2020   Procedure: ICD IMPLANT;  Surgeon: Marinus Maw, MD;  [facility] INVASIVE CV LAB;  Service: Cardiovascular;  Laterality: N/A;   INSERTION OF MESH N/A 06/27/2015   Procedure: INSERTION OF MESH;  Surgeon: Axel Filler, MD;  Location: [facility] ORS;  Service: General;  Laterality: N/A;   IR 3D INDEPENDENT WKST  11/14/2019   IR 3D INDEPENDENT WKST  11/22/2019   IR  ANGIO INTRA EXTRACRAN SEL COM CAROTID INNOMINATE BILAT MOD SED  11/14/2019   IR ANGIO INTRA EXTRACRAN SEL INTERNAL CAROTID UNI R MOD SED  11/22/2019   IR ANGIO VERTEBRAL SEL VERTEBRAL BILAT MOD SED  11/14/2019   IR ANGIOGRAM FOLLOW UP STUDY  11/22/2019   IR CT HEAD LTD  11/22/2019   IR NEURO EACH ADD'L AFTER BASIC UNI RIGHT (MS)  11/22/2019   IR TRANSCATH/EMBOLIZ  11/22/2019   laceration to fingers     left hand   MOUTH SURGERY     secondary to abscess    RADIOLOGY WITH ANESTHESIA N/A 11/22/2019   Procedure: EMBOLIZATION;  Surgeon: Julieanne Cotton, MD;  [facility];  Service: Radiology;  Laterality: N/A;   RIGHT/LEFT HEART CATH AND CORONARY ANGIOGRAPHY N/A 11/12/2019   Procedure: RIGHT/LEFT HEART CATH AND CORONARY ANGIOGRAPHY;  Surgeon: Corky Crafts, MD;  Location: [facility] INVASIVE CV LAB;  Service: Cardiovascular;  Laterality: N/A;   trauma to lip      sutured   VENTRAL HERNIA REPAIR N/A 06/27/2015   Procedure: LAPAROSCOPIC VENTRAL HERNIA;  Surgeon: Axel Filler, MD;  Location: [facility] ORS;  Service: General;  Laterality: N/A;    History reviewed. No pertinent family history. Social History:  reports that he quit smoking about 10 years ago. His smoking use included cigarettes. He has a 0.75 pack-year smoking history. He has never used smokeless tobacco. He reports current alcohol use of about 2.0 standard  drinks of alcohol per week. He reports that he does not use drugs.  Allergies:  Allergies  Allergen Reactions   Influenza Virus Vaccine Other (See Comments)    Got really Sick    Medications Prior to Admission  Medication Sig Dispense Refill   amiodarone (PACERONE) 200 MG tablet Take 1/2 tablet by mouth Monday through Friday.  Take a WHOLE tablet by mouth Saturday and Sunday. 60 tablet 3   apixaban (ELIQUIS) 5 MG TABS tablet TAKE 1 TABLET (5 MG TOTAL) BY MOUTH 2 TIMES DAILY. 180 tablet 3   Ascorbic Acid (VITAMIN C) 1000 MG tablet Take 1,000 mg by mouth daily. Vitafusion     colchicine  0.6 MG tablet Take 0.6 mg by mouth 2 (two) times daily as needed (for gout flares).      Cyanocobalamin (VITAMIN B-12 PO) Take 1 tablet by mouth daily. Vitafusion     diclofenac sodium (VOLTAREN) 1 % GEL Apply 2 g topically 2 (two) times daily as needed (to affected sites- for arthritis pain).      empagliflozin (JARDIANCE) 10 MG TABS tablet Take 10 mg by mouth daily.     furosemide (LASIX) 20 MG tablet Take 3 tablets (60 mg total) by mouth daily. 90 tablet 4   metoprolol succinate (TOPROL-XL) 100 MG 24 hr tablet Take 1 tablet (100 mg total) by mouth in the morning AND 0.5 tablets (50 mg total) every evening. Take with or immediately following a meal.. 180 tablet 2   Multiple Vitamins-Minerals (MULTIVITAMIN WITH MINERALS) tablet Take 1 tablet by mouth daily. Vitafusion     oxyCODONE-acetaminophen (PERCOCET/ROXICET) 5-325 MG tablet Take 1 tablet by mouth 4 (four) times daily.     rosuvastatin (CRESTOR) 5 MG tablet TAKE 1 TABLET(5 MG) BY MOUTH DAILY 90 tablet 3   sacubitril-valsartan (ENTRESTO) 49-51 MG TAKE 1 TABLET BY MOUTH 2 TIMES DAILY. 180 tablet 3   sertraline (ZOLOFT) 25 MG tablet Take 1 tablet (25 mg total) by mouth daily. 30 tablet 3   spironolactone (ALDACTONE) 25 MG tablet Take 1 tablet (25 mg total) by mouth daily. 90 tablet 3   tadalafil (CIALIS) 20 MG tablet Take 20 mg by mouth daily as needed for erectile dysfunction.     zinc gluconate 50 MG tablet Take 50 mg by mouth daily. Vitafusion      Results for orders placed or performed during the hospital encounter of 04/20/22 (from the past 48 hour(s))  Glucose, capillary     Status: None   Collection Time: 04/20/22  3:22 PM  Result Value Ref Range   Glucose-Capillary 95 70 - 99 mg/dL    Comment: Glucose reference range applies only to samples taken after fasting for at least 8 hours.   Comment 1 Notify RN    Comment 2 Document in Chart    No results found.  Review of Systems  Respiratory: Negative.    Cardiovascular: Negative.    Gastrointestinal: Negative.   Endocrine: Negative.   Genitourinary: Negative.     Blood pressure 90/70, pulse 68, temperature 98.8 F (37.1 C), resp. rate 17, height 6\' 1"  (1.854 m), weight 122.5 kg, SpO2 94 %. Physical Exam  Patient has left CTS and left elbow mass with history of gout Plan for surgery today The patient is alert and oriented in no acute distress. The patient complains of pain in the affected upper extremity.  The patient is noted to have a normal HEENT exam. Lung fields show equal chest expansion and no shortness of breath.  Abdomen exam is nontender without distention. Lower extremity examination does not show any fracture dislocation or blood clot symptoms. Pelvis is stable and the neck and back are stable and nontender.  Assessment/Plan We are planning surgery for your upper extremity. The risk and benefits of surgery to include risk of bleeding, infection, anesthesia,  damage to normal structures and failure of the surgery to accomplish its intended goals of relieving symptoms and restoring function have been discussed in detail. With this in mind we plan to proceed. I have specifically discussed with the patient the pre-and postoperative regime and the dos and don'ts and risk and benefits in great detail. Risk and benefits of surgery also include risk of dystrophy(CRPS), chronic nerve pain, failure of the healing process to go onto completion and other inherent risks of surgery The relavent the pathophysiology of the disease/injury process, as well as the alternatives for treatment and postoperative course of action has been discussed in great detail with the patient who desires to proceed.  We will do everything in our power to help you (the patient) restore function to the upper extremity. It is a pleasure to see this patient today.   Oletta Cohn III, MD 04/20/2022, 3:58 PM

## 2022-04-20 NOTE — Anesthesia Procedure Notes (Signed)
Procedure Name: LMA Insertion Date/Time: 04/20/2022 4:32 PM  Performed by: Cy Blamer, CRNAPre-anesthesia Checklist: Patient identified, Emergency Drugs available, Suction available, Patient being monitored and Timeout performed Patient Re-evaluated:Patient Re-evaluated prior to induction Oxygen Delivery Method: Circle system utilized Preoxygenation: Pre-oxygenation with 100% oxygen Induction Type: IV induction LMA: LMA inserted LMA Size: 5.0 Number of attempts: 1 Placement Confirmation: breath sounds checked- equal and bilateral and positive ETCO2 Tube secured with: Tape Dental Injury: Teeth and Oropharynx as per pre-operative assessment

## 2022-04-20 NOTE — Op Note (Signed)
Operative note April 20, 2022  Dominica Severin, MD  Date of dictation and surgery April 20, 2022  Preoperative diagnosis left carpal tunnel syndrome.  Left ulnar nerve compression with large mass left elbow.  History of gouty tophi throughout the upper extremities.  Postop diagnosis: The same  Procedure: #1 left carpal tunnel release #2 8 x 7 cm mass excision deep in location/subfascial left elbow #3 bursectomy left elbow #4 ulnar nerve release left elbow/in situ cubital tunnel release  Anesthesia General  Surgeon Dominica Severin  Estimated blood loss less than 50 cc  Drains 1 in the elbow  Description of procedure: Patient was taken to the operative theater underwent a smooth induction of general anesthesia.  He was prepped with Hibiclens scrub followed by Betadine scrub.  Body parts well-padded and timeout was observed.  Under sterile field we inflated the tourniquet and ensured that preoperative antibiotics were given.  Following this I made an incision at the transverse carpal ligament region dissection was carried down palmar fascia incised and deep retraction accomplished until the distal edge of the transverse carpal ligament could be released under 4.0 loupe magnification.  Fat pad aggress nicely and distal or proximal dissection was carried out nicely.  A open release was done under direct vision.  As he had a very stout proximal band I made a counterincision transverse in nature 1 to 2 fingerbreadths above the distal wrist crease and released the antebrachial fascia to ensure there is absolutely no pressure on the nerve.  The nerve looked excellent.  It was nicely decompressed.  Bipolar cautery was used for hemostasis and the wound was ultimately closed with the tourniquet deflated and hemostasis secured.  All went quite well.  5 cc of Sensorcaine was placed in the wound for postop analgesia.  Following this attention was turned towards the patient's elbow.  A posterior  incision was made.  At this time the patient had a large deep mass subfascial in nature with significant gout impregnation and bursitis.  At this juncture for purposes of nerve decompression I would dissected medially and released the ulnar nerve about the arcade of Struthers, medial intermuscular septum, cubital tunnel, Osborne's ligament, and the 2 heads of the FCU both superficial and deep.  The nerve sat tension-free and was protected.  At this time I performed a combination of bursectomy followed by deep mass removal of a 8 by centimeter nature.  The patient tolerated this well.  The patient had gout impregnation throughout the area which was rather impressive.  We remove this and sent for specimen with the gouty mass which was presumed.  I irrigated copiously deflated the tourniquet and following this checked the nerve once again.  Thus, a ulnar nerve release, bursectomy, and deep mass removal was accomplished without difficulty.  Drain was placed given the fact that the patient is on Eliquis and the patient had the wound closed with 3-0 Prolene.  I was pleased with all aspects of the surgical intervention today.  The patient had standard dressing long-arm splint applied.  We will see him back in the office at 2 weeks postop for suture removal he will go down to therapy for range of motion to the fingers nerve glides and other endeavors.  We will make him a long-arm splint for protection well-padded to prevent recurrence/hematoma and seroma formation which is quite common in this patient population.  At 4 weeks we will let him be more of a no restriction posture if he is looking well.  We will make sure that he takes 3 to 4 days of colchicine postoperatively to prevent gout attack.  I discussed all issues with his family.  Ernisha Sorn MD

## 2022-04-20 NOTE — Transfer of Care (Signed)
Immediate Anesthesia Transfer of Care Note  Patient: Richard Graham  Procedure(s) Performed: Left carpal tunnel release. Left ulnar nerve release. (Left: Arm Lower) Left elbow mass removal and bursectomy (Left: Elbow)  Patient Location: PACU  Anesthesia Type:General  Level of Consciousness: awake, alert  and oriented  Airway & Oxygen Therapy: Patient Spontanous Breathing  Post-op Assessment: Report given to RN, Post -op Vital signs reviewed and stable, Patient moving all extremities X 4 and Patient able to stick tongue midline  Post vital signs: Reviewed  Last Vitals:  Vitals Value Taken Time  BP 102/73 04/20/22 1813  Temp 97.8   Pulse 72 04/20/22 1815  Resp 18 04/20/22 1815  SpO2 86 % 04/20/22 1815  Vitals shown include unvalidated device data.  Last Pain:  Vitals:   04/20/22 1544  PainSc: 0-No pain         Complications: No notable events documented.

## 2022-04-21 ENCOUNTER — Other Ambulatory Visit: Payer: Self-pay

## 2022-04-21 ENCOUNTER — Encounter (HOSPITAL_COMMUNITY): Payer: Self-pay | Admitting: Orthopedic Surgery

## 2022-04-21 ENCOUNTER — Other Ambulatory Visit (HOSPITAL_COMMUNITY): Payer: Self-pay

## 2022-04-21 LAB — POCT I-STAT, CHEM 8
BUN: 25 mg/dL — ABNORMAL HIGH (ref 8–23)
Calcium, Ion: 1.18 mmol/L (ref 1.15–1.40)
Chloride: 99 mmol/L (ref 98–111)
Creatinine, Ser: 0.9 mg/dL (ref 0.61–1.24)
Glucose, Bld: 94 mg/dL (ref 70–99)
HCT: 45 % (ref 39.0–52.0)
Hemoglobin: 15.3 g/dL (ref 13.0–17.0)
Potassium: 4.3 mmol/L (ref 3.5–5.1)
Sodium: 137 mmol/L (ref 135–145)
TCO2: 29 mmol/L (ref 22–32)

## 2022-04-21 NOTE — Anesthesia Postprocedure Evaluation (Signed)
Anesthesia Post Note  Patient: ZIQUAN FIDEL  Procedure(s) Performed: Left carpal tunnel release. Left ulnar nerve release. (Left: Arm Lower) Left elbow mass removal and bursectomy (Left: Elbow)     Patient location during evaluation: PACU Anesthesia Type: General Level of consciousness: awake Pain management: pain level controlled Vital Signs Assessment: post-procedure vital signs reviewed and stable Respiratory status: spontaneous breathing Cardiovascular status: stable Postop Assessment: no apparent nausea or vomiting Anesthetic complications: no   No notable events documented.  Last Vitals:  Vitals:   04/20/22 1845 04/20/22 1900  BP: 103/71 96/64  Pulse: 66 74  Resp: 15 18  Temp:  (!) 36.3 C  SpO2: 91% 94%    Last Pain:  Vitals:   04/20/22 1900  PainSc: 4                  Ngai Parcell

## 2022-04-22 LAB — SURGICAL PATHOLOGY

## 2022-04-26 ENCOUNTER — Other Ambulatory Visit (HOSPITAL_COMMUNITY): Payer: Self-pay

## 2022-04-27 NOTE — Progress Notes (Signed)
[facility] PREP Weekly Session  Patient Details  Name: Richard Graham MRN: [id number] Date of Birth: May 01, 1956 Age: 66 y.o. PCP: [name], DO  Vitals:   04/26/22 1430  Weight: 270 lb (122.5 kg)     [facility] Weekly seesion - 04/27/22 1100       [facility] "PREP" Location   [facility]P" [facility]      Weekly Session   Topic Discussed --   Portions, reading labels, importance of protein increasing muscle mass   Minutes exercised this week 270 minutes    Classes attended to date 30             Pam Jerral Bonito 04/27/2022, 11:46 AM

## 2022-05-04 DIAGNOSIS — M25522 Pain in left elbow: Secondary | ICD-10-CM | POA: Diagnosis not present

## 2022-05-05 NOTE — Progress Notes (Signed)
[facility] PREP Weekly Session  Patient Details  Name: Richard Graham MRN: [id number] Date of Birth: 11/18/1955 Age: 67 y.o. PCP: Sueanne Margarita, DO  Vitals:   05/03/22 1430  Weight: 272 lb (123.4 kg)     [facility] Weekly seesion - 05/05/22 1300       [facility] "PREP" Location   [facility] "PREP" Location [facility]      Weekly Session   Topic Discussed Finding support    Minutes exercised this week 390 minutes    Classes attended to date 17             [place] 05/05/2022, 1:26 PM

## 2022-05-08 ENCOUNTER — Other Ambulatory Visit (HOSPITAL_COMMUNITY): Payer: Self-pay | Admitting: Cardiology

## 2022-05-10 DIAGNOSIS — M79642 Pain in left hand: Secondary | ICD-10-CM | POA: Diagnosis not present

## 2022-05-10 DIAGNOSIS — M25622 Stiffness of left elbow, not elsewhere classified: Secondary | ICD-10-CM | POA: Diagnosis not present

## 2022-05-13 DIAGNOSIS — M109 Gout, unspecified: Secondary | ICD-10-CM | POA: Diagnosis not present

## 2022-05-13 DIAGNOSIS — R7989 Other specified abnormal findings of blood chemistry: Secondary | ICD-10-CM | POA: Diagnosis not present

## 2022-05-13 DIAGNOSIS — E785 Hyperlipidemia, unspecified: Secondary | ICD-10-CM | POA: Diagnosis not present

## 2022-05-13 DIAGNOSIS — Z125 Encounter for screening for malignant neoplasm of prostate: Secondary | ICD-10-CM | POA: Diagnosis not present

## 2022-05-17 DIAGNOSIS — M25522 Pain in left elbow: Secondary | ICD-10-CM | POA: Diagnosis not present

## 2022-05-18 NOTE — Progress Notes (Signed)
[facility] PREP Weekly Session  Patient Details  Name: Richard Graham MRN: [id number] Date of Birth: 06/02/1956 Age: 66 y.o. PCP: Sueanne Margarita, DO  Vitals:   05/17/22 1430  Weight: 272 lb (123.4 kg)     [facility] Weekly seesion - 05/18/22 1300       [facility] "PREP" Location   [facility] "PREP" Location [facility]      Weekly Session   Topic Discussed Hitting roadblocks    Minutes exercised this week 260 minutes    Classes attended to date 20             Barnett Hatter 05/18/2022, 1:42 PM

## 2022-05-19 DIAGNOSIS — D696 Thrombocytopenia, unspecified: Secondary | ICD-10-CM | POA: Diagnosis not present

## 2022-05-19 DIAGNOSIS — I4819 Other persistent atrial fibrillation: Secondary | ICD-10-CM | POA: Diagnosis not present

## 2022-05-19 DIAGNOSIS — M7020 Olecranon bursitis, unspecified elbow: Secondary | ICD-10-CM | POA: Diagnosis not present

## 2022-05-19 DIAGNOSIS — N529 Male erectile dysfunction, unspecified: Secondary | ICD-10-CM | POA: Diagnosis not present

## 2022-05-19 DIAGNOSIS — R2233 Localized swelling, mass and lump, upper limb, bilateral: Secondary | ICD-10-CM | POA: Diagnosis not present

## 2022-05-19 DIAGNOSIS — G5601 Carpal tunnel syndrome, right upper limb: Secondary | ICD-10-CM | POA: Diagnosis not present

## 2022-05-19 DIAGNOSIS — Z0000 Encounter for general adult medical examination without abnormal findings: Secondary | ICD-10-CM | POA: Diagnosis not present

## 2022-05-21 DIAGNOSIS — Z4789 Encounter for other orthopedic aftercare: Secondary | ICD-10-CM | POA: Diagnosis not present

## 2022-05-24 DIAGNOSIS — M79642 Pain in left hand: Secondary | ICD-10-CM | POA: Diagnosis not present

## 2022-05-24 DIAGNOSIS — M25622 Stiffness of left elbow, not elsewhere classified: Secondary | ICD-10-CM | POA: Diagnosis not present

## 2022-05-27 NOTE — Progress Notes (Signed)
[facility] PREP Evaluation  Patient Details  Name: Richard Graham MRN: [id number] Date of Birth: 02/10/1956 Age: 66 y.o. PCP: [name], DO  Vitals:   05/26/22 1530  BP: 100/70  Pulse: 97  SpO2: 94%  Weight: 267 lb (121.1 kg)     [facility] Eval - 05/27/22 1400       [facility] "PREP" Location   [facility] "PREP" Location [facility]      Referral    Referring Provider [facility] Start Date --   final class date 05/24/22     Measurement   Waist Circumference 50.5 inches    Hip Circumference 48.5 inches      Information for Trainer   Goals Encouraged to continue to exercise      Mobility and Daily Activities   I find it easy to walk up or down two or more flights of stairs. 3    I have no trouble taking out the trash. 2    I do housework such as vacuuming and dusting on my own without difficulty. 2    I can easily lift a gallon of milk (8lbs). 4    I can easily walk a mile. 1    I have no trouble reaching into high cupboards or reaching down to pick up something from the floor. 4    I do not have trouble doing out-door work such as Loss adjuster, chartered, raking leaves, or gardening. 2      Mobility and Daily Activities   I feel younger than my age. 2    I feel independent. 3    I feel energetic. 2    I live an active life.  2    I feel strong. 3    I feel healthy. 3    I feel active as other people my age. 2      How fit and strong are you.   Fit and Strong Total Score 35            Past Medical History:  Diagnosis Date   AICD (automatic cardioverter/defibrillator) present    CAD (coronary artery disease), native coronary artery    chronically occluded mid LAD with left to left collaterals.    Cerebral aneurysm    s/p embolization   Chronic systolic (congestive) heart failure (HCC)    biventricular HF with moderate RV dysfunction and severe LV dysfunction by Cardiac MRI with EF 22% no viability in the apical septal/apical/inferior,mid anterior and true apex.      Diabetes mellitus without complication (HCC)    on Jardiance   Gout    HLD (hyperlipidemia)    PAF (paroxysmal atrial fibrillation) (HCC)    s/p DCCV 12/2019   Pre-diabetes    diet controlled, no meds   Presence of permanent cardiac pacemaker    [employer] Pacemaker, last remote check 10/19/21   Sleep apnea    use cpap nightly   Past Surgical History:  Procedure Laterality Date   CARDIAC CATHETERIZATION     CARDIOVERSION N/A 12/28/2019   Procedure: CARDIOVERSION;  Surgeon: Chilton Si, MD;  Location: [facility] ENDOSCOPY;  Service: Cardiovascular;  Laterality: N/A;   CARPAL TUNNEL RELEASE Left 04/20/2022   Procedure: Left carpal tunnel release. Left ulnar nerve release.;  Surgeon: Dominica Severin, MD;  Location: [facility] OR;  Service: Orthopedics;  Laterality: Left;  90 mins   COLONOSCOPY     ICD IMPLANT N/A 03/20/2020   Procedure: ICD IMPLANT;  Surgeon: Marinus Maw,  MD;  Location: [place] CV LAB;  Service: Cardiovascular;  Laterality: N/A;   INSERTION OF MESH N/A 06/27/2015   Procedure: INSERTION OF MESH;  Surgeon: Ralene Ok, MD;  Location: [facility] ORS;  Service: General;  Laterality: N/A;   IR 3D INDEPENDENT WKST  11/14/2019   IR 3D INDEPENDENT WKST  11/22/2019   IR ANGIO INTRA EXTRACRAN SEL COM CAROTID INNOMINATE BILAT MOD SED  11/14/2019   IR ANGIO INTRA EXTRACRAN SEL INTERNAL CAROTID UNI R MOD SED  11/22/2019   IR ANGIO VERTEBRAL SEL VERTEBRAL BILAT MOD SED  11/14/2019   IR ANGIOGRAM FOLLOW UP STUDY  11/22/2019   IR CT HEAD LTD  11/22/2019   IR NEURO EACH ADD'L AFTER BASIC UNI RIGHT (MS)  11/22/2019   IR TRANSCATH/EMBOLIZ  11/22/2019   laceration to fingers     left hand   MASS EXCISION Left 04/20/2022   Procedure: Left elbow mass removal and bursectomy;  Surgeon: Roseanne Kaufman, MD;  Location: [place];  Service: Orthopedics;  Laterality: Left;   MOUTH SURGERY     secondary to abscess    RADIOLOGY WITH ANESTHESIA N/A 11/22/2019   Procedure: EMBOLIZATION;  Surgeon: Luanne Bras, MD;   Location: [place];  Service: Radiology;  Laterality: N/A;   RIGHT/LEFT HEART CATH AND CORONARY ANGIOGRAPHY N/A 11/12/2019   Procedure: RIGHT/LEFT HEART CATH AND CORONARY ANGIOGRAPHY;  Surgeon: Jettie Booze, MD;  Location: [place] CV LAB;  Service: Cardiovascular;  Laterality: N/A;   trauma to lip      sutured   VENTRAL HERNIA REPAIR N/A 06/27/2015   Procedure: LAPAROSCOPIC VENTRAL HERNIA;  Surgeon: Ralene Ok, MD;  Location: [facility] ORS;  Service: General;  Laterality: N/A;   Social History   Tobacco Use  Smoking Status Former   Packs/day: 0.25   Years: 3.00   Total pack years: 0.75   Types: Cigarettes   Quit date: 08/17/2011   Years since quitting: 10.7  Smokeless Tobacco Never   Attendance: >21 workouts, 11 of 12 educational sessions Cardio march: 170 to 200 Sit to stand: 11 to 11 Bicep curl: 18 to 22 Needs continued work on balance   [employer] 05/27/2022, 2:08 PM

## 2022-06-11 NOTE — Progress Notes (Addendum)
Called [employer] to alert representative of patient's upcoming surgery on the 7th of November.  Spoke to [place] and left message for [employer].  1120 Received call back from [facility].

## 2022-06-11 NOTE — Progress Notes (Signed)
Surgical Instructions    Your procedure is scheduled on Tuesday November 7th.  Report to [facility] Main Entrance "A" at 1:15 P.M., then check in with the Admitting office.  Call this number if you have problems the morning of surgery:  [phone]   If you have any questions prior to your surgery date call [phone]: Open Monday-Friday 8am-4pm If you experience any cold or flu symptoms such as cough, fever, chills, shortness of breath, etc. between now and your scheduled surgery, please notify us at the above number     Remember:  Do not eat after midnight the night before your surgery  You may drink clear liquids until 1215 pm the afternoon of your surgery.   Clear liquids allowed are: Water, Non-Citrus Juices (without pulp), Carbonated Beverages, Clear Tea, Black Coffee ONLY (NO MILK, CREAM OR POWDERED CREAMER of any kind), and Gatorade   Enhanced Recovery after Surgery for Orthopedics Enhanced Recovery after Surgery is a protocol used to improve the stress on your body and your recovery after surgery.  Patient Instructions   The day of surgery (if you have diabetes):  Drink ONE small 10 oz bottle of water by __12:15___ Pm the morning of surgery This bottle was given to you during your hospital  pre-op appointment visit.  Nothing else to drink after completing the  Small 10 oz bottle of water.         If you have questions, please contact your surgeon's office.     Take these medicines the morning of surgery with A SIP OF WATER: amiodarone (PACERONE) 200 MG tablet colchicine 0.6 MG tablet metoprolol succinate (TOPROL-XL) 100 MG 24 hr tablet oxyCODONE-acetaminophen (PERCOCET/ROXICET) 5-325 MG tablet rosuvastatin (CRESTOR) 5 MG tablet   Follow your surgeon's instructions on when to stop Eliquis.  If no instructions were given by your surgeon then you will need to call the office to get those instructions.     As of today, STOP taking any Voltaren, Aspirin (unless  otherwise instructed by your surgeon) Aleve, Naproxen, Ibuprofen, Motrin, Advil, Goody's, BC's, all herbal medications, fish oil, and all vitamins.  WHAT DO I DO ABOUT MY DIABETES MEDICATION?   Do not take oral diabetes medicines (pills) the morning of surgery.  HOLD your Jardiance 72 hours (3 days) prior to surgery.   The day of surgery, do not take other diabetes injectables, including Byetta (exenatide), Bydureon (exenatide ER), Victoza (liraglutide), or Trulicity (dulaglutide).  If your CBG is greater than 220 mg/dL, you may take  of your sliding scale (correction) dose of insulin.   HOW TO MANAGE YOUR DIABETES BEFORE AND AFTER SURGERY  Why is it important to control my blood sugar before and after surgery? Improving blood sugar levels before and after surgery helps healing and can limit problems. A way of improving blood sugar control is eating a healthy diet by:  Eating less sugar and carbohydrates  Increasing activity/exercise  Talking with your doctor about reaching your blood sugar goals High blood sugars (greater than 180 mg/dL) can raise your risk of infections and slow your recovery, so you will need to focus on controlling your diabetes during the weeks before surgery. Make sure that the doctor who takes care of your diabetes knows about your planned surgery including the date and location.  How do I manage my blood sugar before surgery? Check your blood sugar at least 4 times a day, starting 2 days before surgery, to make sure that the level is not too high or  low.  Check your blood sugar the morning of your surgery when you wake up and every 2 hours until you get to the Short Stay unit.  If your blood sugar is less than 70 mg/dL, you will need to treat for low blood sugar: Do not take insulin. Treat a low blood sugar (less than 70 mg/dL) with  cup of clear juice (cranberry or apple), 4 glucose tablets, OR glucose gel. Recheck blood sugar in 15 minutes after  treatment (to make sure it is greater than 70 mg/dL). If your blood sugar is not greater than 70 mg/dL on recheck, call [phone] for further instructions. Report your blood sugar to the short stay nurse when you get to Short Stay.  If you are admitted to the hospital after surgery: Your blood sugar will be checked by the staff and you will probably be given insulin after surgery (instead of oral diabetes medicines) to make sure you have good blood sugar levels. The goal for blood sugar control after surgery is 80-180 mg/dL.      DAY OF SURGERY    Do not wear jewelry  Do not wear lotions, powders, cologne or deodorant. Do not shave 48 hours prior to surgery.  Men may shave face and neck. Do not bring valuables to the hospital. Do not wear nail polish  [facility] is not responsible for any belongings or valuables.    Do NOT Smoke (Tobacco/Vaping)  24 hours prior to your procedure  If you use a CPAP at night, you may bring your mask for your overnight stay.   Contacts, glasses, hearing aids, dentures or partials may not be worn into surgery, please bring cases for these belongings   For patients admitted to the hospital, discharge time will be determined by your treatment team.   Patients discharged the day of surgery will not be allowed to drive home, and someone needs to stay with them for 24 hours.   SURGICAL WAITING ROOM VISITATION Patients having surgery or a procedure may have no more than 2 support people in the waiting area - these visitors may rotate.   Children under the age of 74 must have an adult with them who is not the patient. If the patient needs to stay at the hospital during part of their recovery, the visitor guidelines for inpatient rooms apply. Pre-op nurse will coordinate an appropriate time for 1 support person to accompany patient in pre-op.  This support person may not rotate.   Please refer to  [email or web address] for the visitor guidelines for Inpatients (after your surgery is over and you are in a regular room).    Special instructions:    Oral Hygiene is also important to reduce your risk of infection.  Remember - BRUSH YOUR TEETH THE MORNING OF SURGERY WITH YOUR REGULAR TOOTHPASTE   [facility]- Preparing For Surgery  Before surgery, you can play an important role. Because skin is not sterile, your skin needs to be as free of germs as possible. You can reduce the number of germs on your skin by washing with CHG (chlorahexidine gluconate) Soap before surgery.  CHG is an antiseptic cleaner which kills germs and bonds with the skin to continue killing germs even after washing.     Please do not use if you have an allergy to CHG or antibacterial soaps. If your skin becomes reddened/irritated stop using the CHG.  Do not shave (including legs and underarms) for at least 48 hours prior to first CHG shower.  It is OK to shave your face.  Please follow these instructions carefully.     Shower the NIGHT BEFORE SURGERY and the MORNING OF SURGERY with CHG Soap.   If you chose to wash your hair, wash your hair first as usual with your normal shampoo. After you shampoo, rinse your hair and body thoroughly to remove the shampoo.  Then [employer] and genitals (private parts) with your normal soap and rinse thoroughly to remove soap.  After that Use CHG Soap as you would any other liquid soap. You can apply CHG directly to the skin and wash gently with a scrungie or a clean washcloth.   Apply the CHG Soap to your body ONLY FROM THE NECK DOWN.  Do not use on open wounds or open sores. Avoid contact with your eyes, ears, mouth and genitals (private parts). Wash Face and genitals (private parts)  with your normal soap.   Wash thoroughly, paying special attention to the area where your surgery will be performed.  Thoroughly rinse your body with  warm water from the neck down.  DO NOT shower/wash with your normal soap after using and rinsing off the CHG Soap.  Pat yourself dry with a CLEAN TOWEL.  Wear CLEAN PAJAMAS to bed the night before surgery  Place CLEAN SHEETS on your bed the night before your surgery  DO NOT SLEEP WITH PETS.   Day of Surgery:  Take a shower with CHG soap. Wear Clean/Comfortable clothing the morning of surgery Do not apply any deodorants/lotions.   Remember to brush your teeth WITH YOUR REGULAR TOOTHPASTE.    If you received a COVID test during your pre-op visit, it is requested that you wear a mask when out in public, stay away from anyone that may not be feeling well, and notify your surgeon if you develop symptoms. If you have been in contact with anyone that has tested positive in the last 10 days, please notify your surgeon.    Please read over the following fact sheets that you were given.

## 2022-06-14 ENCOUNTER — Other Ambulatory Visit (HOSPITAL_COMMUNITY): Payer: Self-pay

## 2022-06-14 ENCOUNTER — Telehealth (HOSPITAL_COMMUNITY): Payer: Self-pay

## 2022-06-14 ENCOUNTER — Inpatient Hospital Stay (HOSPITAL_COMMUNITY)
Admission: RE | Admit: 2022-06-14 | Discharge: 2022-06-14 | Disposition: A | Discharge status: Home / Self Care | Payer: Medicare Other | Source: Ambulatory Visit

## 2022-06-14 ENCOUNTER — Encounter: Payer: Self-pay | Admitting: Internal Medicine

## 2022-06-14 NOTE — Progress Notes (Signed)
PERIOPERATIVE PRESCRIPTION FOR IMPLANTED CARDIAC DEVICE PROGRAMMING  Patient Information: Name:  Richard Graham  DOB:  [birth year]  MRN:  [id number]  Planned Procedure:  Right carpal tunnel release, right elbow mass  Surgeon:  Roseanne Kaufman  Date of Procedure:  06/22/22  Cautery will be used.  Position during surgery:  prone   Device Information:  Clinic EP Physician:  Cristopher Peru, MD   Device Type:  Defibrillator Manufacturer and Phone #:  [employer]: [phone] Pacemaker Dependent?:  No. Date of Last Device Check:  05/26/2022 Normal Device Function?:  Yes.    Electrophysiologist's Recommendations:  Have magnet available. Provide continuous ECG monitoring when magnet is used or reprogramming is to be performed.  Procedure should not interfere with device function.  No device programming or magnet placement needed.  Per Device Clinic Standing Orders, Damian Leavell, RN  10:17 AM 06/14/2022

## 2022-06-14 NOTE — Pre-Procedure Instructions (Signed)
Surgical Instructions    Your procedure is scheduled on June 22, 2022.  Report to [facility] Main Entrance "A" at 1:15 P.M., then check in with the Admitting office.  Call this number if you have problems the morning of surgery:  [phone]   If you have any questions prior to your surgery date call [phone]: Open Monday-Friday 8am-4pm    Remember:  Do not eat after midnight the night before your surgery  You may drink clear liquids until 12:15 PM the afternoon of your surgery.   Clear liquids allowed are: Water, Non-Citrus Juices (without pulp), Carbonated Beverages, Clear Tea, Black Coffee Only (NO MILK, CREAM OR POWDERED CREAMER of any kind), and Gatorade.  Patient Instructions  The night before surgery:  No food after midnight. ONLY clear liquids after midnight  The day of surgery (if you have diabetes): Drink ONE (1) 12 oz G2 given to you in your pre admission testing appointment by 12:15 PM the afternoon of surgery. Drink in one sitting. Do not sip.  This drink was given to you during your hospital  pre-op appointment visit.  Nothing else to drink after completing the  12 oz bottle of G2.         If you have questions, please contact your surgeon's office.       Take these medicines the morning of surgery with A SIP OF WATER:  amiodarone (PACERONE)  metoprolol succinate (TOPROL-XL)   oxyCODONE-acetaminophen (PERCOCET/ROXICET)  rosuvastatin (CRESTOR)  Colchicine - may take if needed     Follow your surgeon's instructions on when to stop apixaban (ELIQUIS).  If no instructions were given by your surgeon then you will need to call the office to get those instructions.     As of today, STOP taking any Aspirin (unless otherwise instructed by your surgeon) Aleve, Naproxen, Ibuprofen, Motrin, Advil, Goody's, BC's, all herbal medications, fish oil, and all vitamins. This includes your medication: diclofenac sodium (VOLTAREN) GEL.   WHAT DO I DO ABOUT MY  DIABETES MEDICATION?   STOP taking empagliflozin (JARDIANCE) three days prior to surgery. Your last dose of this medication will be Friday, November 3rd.   HOW TO MANAGE YOUR DIABETES BEFORE AND AFTER SURGERY  Why is it important to control my blood sugar before and after surgery? Improving blood sugar levels before and after surgery helps healing and can limit problems. A way of improving blood sugar control is eating a healthy diet by:  Eating less sugar and carbohydrates  Increasing activity/exercise  Talking with your doctor about reaching your blood sugar goals High blood sugars (greater than 180 mg/dL) can raise your risk of infections and slow your recovery, so you will need to focus on controlling your diabetes during the weeks before surgery. Make sure that the doctor who takes care of your diabetes knows about your planned surgery including the date and location.  How do I manage my blood sugar before surgery? Check your blood sugar at least 4 times a day, starting 2 days before surgery, to make sure that the level is not too high or low.  Check your blood sugar the morning of your surgery when you wake up and every 2 hours until you get to the Short Stay unit.  If your blood sugar is less than 70 mg/dL, you will need to treat for low blood sugar: Do not take insulin. Treat a low blood sugar (less than 70 mg/dL) with  cup of clear juice (cranberry or apple), 4 glucose tablets,  OR glucose gel. Recheck blood sugar in 15 minutes after treatment (to make sure it is greater than 70 mg/dL). If your blood sugar is not greater than 70 mg/dL on recheck, call [phone] for further instructions. Report your blood sugar to the short stay nurse when you get to Short Stay.  If you are admitted to the hospital after surgery: Your blood sugar will be checked by the staff and you will probably be given insulin after surgery (instead of oral diabetes medicines) to make sure you have good  blood sugar levels. The goal for blood sugar control after surgery is 80-180 mg/dL.                      Do NOT Smoke (Tobacco/Vaping) for 24 hours prior to your procedure.  If you use a CPAP at night, you may bring your mask/headgear for your overnight stay.   Contacts, glasses, piercing's, hearing aid's, dentures or partials may not be worn into surgery, please bring cases for these belongings.    For patients admitted to the hospital, discharge time will be determined by your treatment team.   Patients discharged the day of surgery will not be allowed to drive home, and someone needs to stay with them for 24 hours.  SURGICAL WAITING ROOM VISITATION Patients having surgery or a procedure may have no more than 2 support people in the waiting area - these visitors may rotate.   Children under the age of 38 must have an adult with them who is not the patient. If the patient needs to stay at the hospital during part of their recovery, the visitor guidelines for inpatient rooms apply. Pre-op nurse will coordinate an appropriate time for 1 support person to accompany patient in pre-op.  This support person may not rotate.   Please refer to the [facility] website for the visitor guidelines for Inpatients (after your surgery is over and you are in a regular room).    Special instructions:   [place]- Preparing For Surgery  Before surgery, you can play an important role. Because skin is not sterile, your skin needs to be as free of germs as possible. You can reduce the number of germs on your skin by washing with CHG (chlorahexidine gluconate) Soap before surgery.  CHG is an antiseptic cleaner which kills germs and bonds with the skin to continue killing germs even after washing.    Oral Hygiene is also important to reduce your risk of infection.  Remember - BRUSH YOUR TEETH THE MORNING OF SURGERY WITH YOUR REGULAR TOOTHPASTE  Please do not use if you have an allergy to CHG or  antibacterial soaps. If your skin becomes reddened/irritated stop using the CHG.  Do not shave (including legs and underarms) for at least 48 hours prior to first CHG shower. It is OK to shave your face.  Please follow these instructions carefully.   Shower the NIGHT BEFORE SURGERY and the MORNING OF SURGERY  If you chose to wash your hair, wash your hair first as usual with your normal shampoo.  After you shampoo, rinse your hair and body thoroughly to remove the shampoo.  Use CHG Soap as you would any other liquid soap. You can apply CHG directly to the skin and wash gently with a scrungie or a clean washcloth.   Apply the CHG Soap to your body ONLY FROM THE NECK DOWN.  Do not use on open wounds or open sores. Avoid contact with your eyes,  ears, mouth and genitals (private parts). Wash Face and genitals (private parts)  with your normal soap.   Wash thoroughly, paying special attention to the area where your surgery will be performed.  Thoroughly rinse your body with warm water from the neck down.  DO NOT shower/wash with your normal soap after using and rinsing off the CHG Soap.  Pat yourself dry with a CLEAN TOWEL.  Wear CLEAN PAJAMAS to bed the night before surgery  Place CLEAN SHEETS on your bed the night before your surgery  DO NOT SLEEP WITH PETS.   Day of Surgery: Take a shower with CHG soap. Do not wear jewelry or makeup Do not wear lotions, powders, perfumes/colognes, or deodorant. Do not shave 48 hours prior to surgery.  Men may shave face and neck. Do not bring valuables to the hospital.  [facility] is not responsible for any belongings or valuables. Do not wear nail polish, gel polish, artificial nails, or any other type of covering on natural nails (fingers and toes) If you have artificial nails or gel coating that need to be removed by a nail salon, please have this removed prior to surgery. Artificial nails or gel coating may interfere with anesthesia's ability  to adequately monitor your vital signs.  Wear Clean/Comfortable clothing the morning of surgery Remember to brush your teeth WITH YOUR REGULAR TOOTHPASTE.   Please read over the following fact sheets that you were given.    If you received a COVID test during your pre-op visit  it is requested that you wear a mask when out in public, stay away from anyone that may not be feeling well and notify your surgeon if you develop symptoms. If you have been in contact with anyone that has tested positive in the last 10 days please notify you surgeon.

## 2022-06-14 NOTE — Telephone Encounter (Addendum)
Advanced Heart Failure Patient Advocate Encounter   Received renew notification for Jard[facility] Cares). This patient is also currently taking Entresto.  This patient is eligible for a grant that would cover the cost of both medications for the 2024 year. Will enroll for the grant program in November, and provide patient with billing information.   Confirmed with patient by phone.  Patient will need to call for last refill from [facility] before the end of this year, and stated preference for using a [facility] for both medications starting in January.

## 2022-06-17 ENCOUNTER — Ambulatory Visit (INDEPENDENT_AMBULATORY_CARE_PROVIDER_SITE_OTHER): Payer: BC Managed Care – PPO

## 2022-06-17 DIAGNOSIS — I5022 Chronic systolic (congestive) heart failure: Secondary | ICD-10-CM

## 2022-06-17 LAB — CUP PACEART REMOTE DEVICE CHECK
Battery Remaining Longevity: 95 mo
Battery Remaining Percentage: 78 %
Battery Voltage: 2.99 V
Brady Statistic RV Percent Paced: 1 %
Date Time Interrogation Session: 20231102020034
HighPow Impedance: 81 Ohm
Implantable Lead Connection Status: 753985
Implantable Lead Implant Date: 20210805
Implantable Lead Location: 753860
Implantable Pulse Generator Implant Date: 20210805
Lead Channel Impedance Value: 390 Ohm
Lead Channel Pacing Threshold Amplitude: 1 V
Lead Channel Pacing Threshold Pulse Width: 0.5 ms
Lead Channel Sensing Intrinsic Amplitude: 11.7 mV
Lead Channel Setting Pacing Amplitude: 2.5 V
Lead Channel Setting Pacing Pulse Width: 0.5 ms
Lead Channel Setting Sensing Sensitivity: 0.5 mV
Pulse Gen Serial Number: 111025604
Zone Setting Status: 755011

## 2022-06-18 ENCOUNTER — Inpatient Hospital Stay (HOSPITAL_COMMUNITY): Admission: RE | Admit: 2022-06-18 | Payer: Medicare Other | Source: Ambulatory Visit

## 2022-06-24 ENCOUNTER — Other Ambulatory Visit (HOSPITAL_COMMUNITY): Payer: Self-pay

## 2022-06-24 NOTE — Telephone Encounter (Signed)
Advanced Heart Failure Patient Advocate Encounter  The patient was approved for a [employer] grant that will help cover the cost of Entresto, Jardiance.  Total amount awarded, $10,000.  Effective: 05/25/2022 - 05/25/2023.  BIN [id number] PCN [id number] [id number] ID [id number]  Patient was provided with a copy of approval and processing information by email.  Burnell Blanks, CPhT Rx Patient Advocate Phone: [phone]

## 2022-06-28 NOTE — Progress Notes (Signed)
Remote ICD transmission.   

## 2022-07-09 NOTE — Pre-Procedure Instructions (Signed)
Surgical Instructions    Your procedure is scheduled on Tuesday, December 5th.  Report to [facility] Main Entrance "A" at 1:15 P.M., then check in with the Admitting office.  Call this number if you have problems the morning of surgery:  [phone]   If you have any questions prior to your surgery date call [phone]: Open Monday-Friday 8am-4pm    Remember:  Do not eat after midnight the night before your surgery  You may drink clear liquids until 12:15 PM the day of your surgery.   Clear liquids allowed are: Water, Non-Citrus Juices (without pulp), Carbonated Beverages, Clear Tea, Black Coffee Only (NO MILK, CREAM OR POWDERED CREAMER of any kind), and Gatorade.   Patient Instructions  The night before surgery:  No food after midnight. ONLY clear liquids after midnight    The day of surgery (if you have diabetes): Drink ONE (1) 12 oz G2 given to you in your pre admission testing appointment by 12:15 PM the morning of surgery. Drink in one sitting. Do not sip.  This drink was given to you during your hospital  pre-op appointment visit.  Nothing else to drink after completing the  12 oz bottle of G2.         If you have questions, please contact your surgeon's office.     Take these medicines the morning of surgery with A SIP OF WATER  amiodarone (PACERONE)   metoprolol succinate (TOPROL-XL)  oxyCODONE-acetaminophen (PERCOCET/ROXICET)  rosuvastatin (CRESTOR)    If needed: colchicine   Follow your surgeon's instructions on when to stop Eliquis.  If no instructions were given by your surgeon then you will need to call the office to get those instructions.     As of today, STOP taking any Aspirin (unless otherwise instructed by your surgeon) Aleve, Naproxen, Ibuprofen, Motrin, Advil, Goody's, BC's, all herbal medications, fish oil, and all vitamins. This includes diclofenac sodium (VOLTAREN) 1 % GEL.           WHAT DO I DO ABOUT MY DIABETES MEDICATION?   Hold  empagliflozin (JARDIANCE) for 72 hours prior to surgery. Last dose 12/1.     HOW TO MANAGE YOUR DIABETES BEFORE AND AFTER SURGERY  Why is it important to control my blood sugar before and after surgery? Improving blood sugar levels before and after surgery helps healing and can limit problems. A way of improving blood sugar control is eating a healthy diet by:  Eating less sugar and carbohydrates  Increasing activity/exercise  Talking with your doctor about reaching your blood sugar goals High blood sugars (greater than 180 mg/dL) can raise your risk of infections and slow your recovery, so you will need to focus on controlling your diabetes during the weeks before surgery. Make sure that the doctor who takes care of your diabetes knows about your planned surgery including the date and location.  How do I manage my blood sugar before surgery? Check your blood sugar at least 4 times a day, starting 2 days before surgery, to make sure that the level is not too high or low.  Check your blood sugar the morning of your surgery when you wake up and every 2 hours until you get to the Short Stay unit.  If your blood sugar is less than 70 mg/dL, you will need to treat for low blood sugar: Do not take insulin. Treat a low blood sugar (less than 70 mg/dL) with  cup of clear juice (cranberry or apple), 4 glucose tablets, OR  glucose gel. Recheck blood sugar in 15 minutes after treatment (to make sure it is greater than 70 mg/dL). If your blood sugar is not greater than 70 mg/dL on recheck, call [phone] for further instructions. Report your blood sugar to the short stay nurse when you get to Short Stay.  If you are admitted to the hospital after surgery: Your blood sugar will be checked by the staff and you will probably be given insulin after surgery (instead of oral diabetes medicines) to make sure you have good blood sugar levels. The goal for blood sugar control after surgery is 80-180  mg/dL.             Do NOT Smoke (Tobacco/Vaping) for 24 hours prior to your procedure.  If you use a CPAP at night, you may bring your mask/headgear for your overnight stay.   Contacts, glasses, piercing's, hearing aid's, dentures or partials may not be worn into surgery, please bring cases for these belongings.    For patients admitted to the hospital, discharge time will be determined by your treatment team.   Patients discharged the day of surgery will not be allowed to drive home, and someone needs to stay with them for 24 hours.  SURGICAL WAITING ROOM VISITATION Patients having surgery or a procedure may have no more than 2 support people in the waiting area - these visitors may rotate.   Children under the age of 66 must have an adult with them who is not the patient. If the patient needs to stay at the hospital during part of their recovery, the visitor guidelines for inpatient rooms apply. Pre-op nurse will coordinate an appropriate time for 1 support person to accompany patient in pre-op.  This support person may not rotate.   Please refer to the [facility] website for the visitor guidelines for Inpatients (after your surgery is over and you are in a regular room).    Special instructions:   - Preparing For Surgery  Before surgery, you can play an important role. Because skin is not sterile, your skin needs to be as free of germs as possible. You can reduce the number of germs on your skin by washing with CHG (chlorahexidine gluconate) Soap before surgery.  CHG is an antiseptic cleaner which kills germs and bonds with the skin to continue killing germs even after washing.    Oral Hygiene is also important to reduce your risk of infection.  Remember - BRUSH YOUR TEETH THE MORNING OF SURGERY WITH YOUR REGULAR TOOTHPASTE  Please do not use if you have an allergy to CHG or antibacterial soaps. If your skin becomes reddened/irritated stop using the CHG.  Do not shave  (including legs and underarms) for at least 48 hours prior to first CHG shower. It is OK to shave your face.  Please follow these instructions carefully.   Shower the NIGHT BEFORE SURGERY and the MORNING OF SURGERY  If you chose to wash your hair, wash your hair first as usual with your normal shampoo.  After you shampoo, rinse your hair and body thoroughly to remove the shampoo.  Use CHG Soap as you would any other liquid soap. You can apply CHG directly to the skin and wash gently with a scrungie or a clean washcloth.   Apply the CHG Soap to your body ONLY FROM THE NECK DOWN.  Do not use on open wounds or open sores. Avoid contact with your eyes, ears, mouth and genitals (private parts). Wash Face and genitals (  private parts)  with your normal soap.   Wash thoroughly, paying special attention to the area where your surgery will be performed.  Thoroughly rinse your body with warm water from the neck down.  DO NOT shower/wash with your normal soap after using and rinsing off the CHG Soap.  Pat yourself dry with a CLEAN TOWEL.  Wear CLEAN PAJAMAS to bed the night before surgery  Place CLEAN SHEETS on your bed the night before your surgery  DO NOT SLEEP WITH PETS.   Day of Surgery: Take a shower with CHG soap. Do not wear jewelry  Do not wear lotions, powders, colognes, or deodorant.  Men may shave face and neck. Do not bring valuables to the hospital. [facility] is not responsible for any belongings or valuables.  Wear Clean/Comfortable clothing the morning of surgery Remember to brush your teeth WITH YOUR REGULAR TOOTHPASTE.   Please read over the following fact sheets that you were given.    If you received a COVID test during your pre-op visit  it is requested that you wear a mask when out in public, stay away from anyone that may not be feeling well and notify your surgeon if you develop symptoms. If you have been in contact with anyone that has tested positive in the  last 10 days please notify you surgeon.

## 2022-07-09 NOTE — Progress Notes (Signed)
Device orders request sent to [employer] Rep notified and aware that we are awaiting device orders

## 2022-07-12 ENCOUNTER — Other Ambulatory Visit: Payer: Self-pay

## 2022-07-12 ENCOUNTER — Encounter (HOSPITAL_COMMUNITY)
Admission: RE | Admit: 2022-07-12 | Discharge: 2022-07-12 | Disposition: A | Discharge status: Home / Self Care | Payer: Medicare Other | Source: Ambulatory Visit | Attending: Orthopedic Surgery | Admitting: Orthopedic Surgery

## 2022-07-12 ENCOUNTER — Encounter: Payer: Self-pay | Admitting: Internal Medicine

## 2022-07-12 ENCOUNTER — Encounter (HOSPITAL_COMMUNITY): Payer: Self-pay

## 2022-07-12 VITALS — BP 100/88 | HR 53 | Temp 98.0°F | Resp 18 | Ht 73.0 in | Wt 263.6 lb

## 2022-07-12 DIAGNOSIS — E119 Type 2 diabetes mellitus without complications: Secondary | ICD-10-CM | POA: Insufficient documentation

## 2022-07-12 DIAGNOSIS — I480 Paroxysmal atrial fibrillation: Secondary | ICD-10-CM | POA: Insufficient documentation

## 2022-07-12 DIAGNOSIS — Z01812 Encounter for preprocedural laboratory examination: Secondary | ICD-10-CM | POA: Diagnosis not present

## 2022-07-12 DIAGNOSIS — Z01818 Encounter for other preprocedural examination: Secondary | ICD-10-CM

## 2022-07-12 LAB — BASIC METABOLIC PANEL
Anion gap: 13 (ref 5–15)
BUN: 19 mg/dL (ref 8–23)
CO2: 27 mmol/L (ref 22–32)
Calcium: 9.3 mg/dL (ref 8.9–10.3)
Chloride: 98 mmol/L (ref 98–111)
Creatinine, Ser: 1.11 mg/dL (ref 0.61–1.24)
GFR, Estimated: >60 mL/min (ref >60)
Glucose, Bld: 106 mg/dL — ABNORMAL HIGH (ref 70–99)
Potassium: 4.7 mmol/L (ref 3.5–5.1)
Sodium: 138 mmol/L (ref 135–145)

## 2022-07-12 LAB — GLUCOSE, CAPILLARY: Glucose-Capillary: 123 mg/dL — ABNORMAL HIGH (ref 70–99)

## 2022-07-12 LAB — CBC
HCT: 53.3 % — ABNORMAL HIGH (ref 39.0–52.0)
Hemoglobin: 17.2 g/dL — ABNORMAL HIGH (ref 13.0–17.0)
MCH: 32.1 pg (ref 26.0–34.0)
MCHC: 32.3 g/dL (ref 30.0–36.0)
MCV: 99.4 fL (ref 80.0–100.0)
Platelets: 210 10*3/uL (ref 150–400)
RBC: 5.36 MIL/uL (ref 4.22–5.81)
RDW: 13.5 % (ref 11.5–15.5)
WBC: 7.6 10*3/uL (ref 4.0–10.5)
nRBC: 0 % (ref 0.0–0.2)

## 2022-07-12 NOTE — Progress Notes (Signed)
PCP - Dr. [name] Cardiologist - Dr. Armanda Magic  PPM/ICD - ICD (St Jude/Abbott) Device Orders - received Rep Notified - not needed  Chest x-ray - 03/20/20 EKG - 10/27/21 Stress Test - 07/01/20 ECHO - 07/23/21 Cardiac Cath - 11/12/19  Sleep Study - 05/21/20 OSA+ CPAP - nightly  DM- Type 2 (Pt claims he is not diabetic and only takes Jardiance "for his kidneys." He does not check CBG at home   Last dose of GLP1 agonist-  n/a   Blood Thinner Instructions: Hold 4 days per pt. Last dose 11/30 Aspirin Instructions: n/a  ERAS Protcol - yes PRE-SURGERY G2- given at PAT  COVID TEST- n/a   Anesthesia review: yes, cardiac hx  Patient denies shortness of breath, fever, cough and chest pain at PAT appointment   All instructions explained to the patient, with a verbal understanding of the material. Patient agrees to go over the instructions while at home for a better understanding. The opportunity to ask questions was provided.

## 2022-07-12 NOTE — Progress Notes (Signed)
PERIOPERATIVE PRESCRIPTION FOR IMPLANTED CARDIAC DEVICE PROGRAMMING  Patient Information: Name:  Richard Graham  DOB:  [birth year]  MRN:  [id number]    Planned Procedure:  Right carpal tunnel release. Right elbow mass excision with bursectomy and ulnar nerve release at the elbow/cubital tunnel release- right   Surgeon:  Dr. Dominica Severin  Date of Procedure:  07/20/22  Cautery will be used.  Position during surgery:  supine   Please send documentation back to:  [facility] (Fax # [phone])  Device Information:  Clinic EP Physician:  [name], MD   Device Type:  Defibrillator Manufacturer and Phone #:  St[employer] Jude/Abbott: [phone] Pacemaker Dependent?:  No. Date of Last Device Check:  06/17/22 Normal Device Function?:  Yes.    Electrophysiologist's Recommendations:  Have magnet available. Provide continuous ECG monitoring when magnet is used or reprogramming is to be performed.  Procedure may interfere with device function.  Magnet should be placed over device during procedure.  Per Device Clinic Standing Orders, Lenor Coffin, RN  9:50 AM 07/12/2022

## 2022-07-13 LAB — HEMOGLOBIN A1C
Hgb A1c MFr Bld: 5.8 % — ABNORMAL HIGH (ref 4.8–5.6)
Mean Plasma Glucose: 120 mg/dL

## 2022-07-13 NOTE — Progress Notes (Signed)
Anesthesia Chart Review:  Case: [id number] Date/Time: 07/20/22 1500   Procedure: Right carpal tunnel release.  Right mass excision with bursectomy and ulnar nerve release at the elbow/cubital tunnel release (Right) - 2 hrs   Anesthesia type: General   Pre-op diagnosis: Right carpal tunnel syndrome.  Right ulnar nerve irritability.  Right elbow mass.   [facility] ROOM 08 / [facility]   Surgeons: Dominica Severin, MD       DISCUSSION: Patient is a 66 year old male scheduled for the above procedure. He is s/p left carpal tunnel and ulnar nerve release, left elbow bursectomy on 04/20/22.   History includes former smoker (quit 08/17/11), CAD, (occluded LAD with collaterals, no significant LAD territory viability on cMRI, so treated medically 10/2019), chronic systolic CHF, PAF (diagnosed 10/2019; s/p DCCV 12/28/19), ICD (St. Jude/Abbott Gallant VR [id number] ICD 03/20/20), HLD, pre-diabetes, OSA (uses CPAP), right MCA cerebral aneurysm (s/p embolization 11/22/19), ventral hernia (s/p repair 06/27/15), obesity.   Last cardiology visit was on 03/09/22 with Prince Rome, NP at [facility]. No chest pain, continue medical management of CAD. Has chronic exertional fatigue and dyspnea. On Eliquis and amiodarone for afib. In 2021, Dr. Ladona Ridgel recommended weight loss before considering afib ablation. EF stable at 35-40% 07/2021 (previously 20-25% in 2021). CPX in 06/2020 showed minimal HF limitation, consider repeating in the future. Volume state okay. Continue Entresto, Lasix, spironolactone, Jardiance, Toprol, Crestor. Okay to stop ASA if okay with neuro IR. 4 month follow-up planned.   Dr. Shirlee Latch gave permission to hold Eliquis for 2 days for similar surgery done on 04/20/22. He reported instructions to hold Eliquis after 07/15/22/ dose.   He has a St. Jude/Abbott [id number] Carolyn Stare VR ICD placed 03/20/20. Remote interrogation 06/17/22. Normal device function. Battery longevity 7 years, 11 months. EP perioperative ICD  recommendations.  Device Information: Clinic EP Physician:  [name], MD  Device Type:  Defibrillator Manufacturer and Phone #:  St. Jude/Abbott: [phone] Pacemaker Dependent?:  No. Date of Last Device Check:  06/17/22           Normal Device Function?:  Yes.     Electrophysiologist's Recommendations: Have magnet available. Provide continuous ECG monitoring when magnet is used or reprogramming is to be performed.  Procedure may interfere with device function.  Magnet should be placed over device during procedure.   He has history of pre-diabetes. Appears Jardiance was initiated as part of his CHF medication regimen. A1c 5.8%.   Anesthesia team to evaluate on the day of surgery.History includes    VS: BP 100/88   Pulse (!) 53   Temp 36.7 C (Oral)   Resp 18   Ht 6\' 1"  (1.854 m)   Wt 119.6 kg   SpO2 97%   BMI 34.78 kg/m    PROVIDERS: , DO is PCP  [name], MD is cardiologist ([facility]). Primary seeing HF and EP since 05/2020. 06/2020, MD is HF cardiologist Marca Ancona, MD is EP cardiologist. Last visit 10/19/21. He was still having some breakthrough A-Flutter.     LABS: Labs reviewed: Acceptable for surgery. (all labs ordered are listed, but only abnormal results are displayed)  Labs Reviewed  GLUCOSE, CAPILLARY - Abnormal; Notable for the following components:      Result Value   Glucose-Capillary 123 (*)    All other components within normal limits  HEMOGLOBIN A1C - Abnormal; Notable for the following components:   Hgb A1c MFr Bld 5.8 (*)    All other components  within normal limits  BASIC METABOLIC PANEL - Abnormal; Notable for the following components:   Glucose, Bld 106 (*)    All other components within normal limits  CBC - Abnormal; Notable for the following components:   Hemoglobin 17.2 (*)    HCT 53.3 (*)    All other components within normal limits    Home Sleep Study 05/21/20: IMPRESSIONS - Severe obstructive sleep apnea  occurred during this study (AHI = 32.8/h). - No significant central sleep apnea occurred during this study (CAI = 0.0/h). - Severe oxygen desaturation was noted during this study (Min O2 = 66%). - Patient snored 22.6% during the sleep.  RECOMMENDATIONS - Recommend ResMed auto CPAP from 4-20cm H2O with heated humidity and mask of choice...     EKG: 10/27/21: Normal sinus rhythm Left axis deviation Right bundle branch block Anteroseptal infarct (cited on or before 20-Dec-2019) Abnormal ECG When compared with ECG of 22-Jul-2021 11:41, Questionable change in initial forces of Anterior leads since last tracing no significant change Confirmed by Lance Muss [phone]) on 10/27/2021 8:48:59 PM     CV: Echo 07/23/21: IMPRESSIONS   1. Septal and apical akinesis no mural apical thrombus Inferior apical  dyskinesis EF and RWMAls similar to echo done 04/22/21. Left ventricular  ejection fraction, by estimation, is 35 to 40%. The left ventricle has  moderately decreased function. The left  ventricle demonstrates regional wall motion abnormalities (see scoring  diagram/findings for description). The left ventricular internal cavity  size was mildly dilated. There is mild asymmetric left ventricular  hypertrophy of the basal and septal  segments. Left ventricular diastolic parameters are consistent with Grade  I diastolic dysfunction (impaired relaxation).   2. AICD wires in RA/RV. Right ventricular systolic function is normal.  The right ventricular size is normal. There is normal pulmonary artery  systolic pressure.   3. The mitral valve is normal in structure. No evidence of mitral valve  regurgitation. No evidence of mitral stenosis.   4. The aortic valve is normal in structure. Aortic valve regurgitation is  not visualized. No aortic stenosis is present.   5. The inferior vena cava is normal in size with greater than 50%  respiratory variability, suggesting right atrial pressure of 3  mmHg.  - Comparison 04/22/21 EF 35-40%, apical septal, apical lateral, and apical anterior wall, and true apex akinesis, aneurysmal apical inferior wall, grade I DD, mildly reduced RVSF; 01/25/20 LVEF 20-25%, 11/06/19 LVEF 20-25%.     CPX 07/01/20: Conclusion: Exercise testing with gas exchange demonstrates low-normal functional capacity when compared to matched sedentary norms. There is no clear cardiopulmonary abnormality or HF limitation. At peak exercise, patient appears deconditioned and limited due to body habitus and is supported with restrictive lung patterns demonstrated in pre-exercise spirometry. Note, there was mild chronotropic incompetence.      MRI Cardiac 12/18/19: IMPRESSION: 1. Severely dilated left ventricle with mild concentric hypertrophy and severely impaired systolic function (LVEF = 22%). There is apical aneurysm involving all of the apical segments and mid anterior wall. There is paradoxical septal motion. Late gadolinium enhancement is seen in the mid anteroseptal wall (25-50% transmurality with good chance of recovery), mid anterior, apical septal, anterior, inferior walls and in the true apex (75-100% transmurality with poor chance of recovery), apical lateral wall (25-50%). 2. Normal right ventricular size, thickness and moderately decreased systolic function (LVEF = 31%). There are no regional wall motion abnormalities. 3. Severely dilated left atrium (54 mm) and mildly dilated right atrium. 4. Mild  mitral and tricuspid regurgitation. - These findings are consistent with ischemic cardiomyopathy with severe LV systolic dysfunction and no viability in most of the segments supplied by mid and apical LAD (mid anterior and apical septal, anterior, interior walls and in the true apex - total of 5 segments).     R/L heart cath 11/12/19: Mid LAD lesion is 100% stenosed. Chronically occluded with left to left collaterals. LV end diastolic pressure is mildly  elevated. There is severe left ventricular systolic dysfunction. The left ventricular ejection fraction is less than 25% by visual estimate. There is no aortic valve stenosis. Hemodynamic findings consistent with mild pulmonary hypertension.   Consider viability study of anterior wall.  If viable, would plan for CTO PCI of LAD.  If no chance of recovery, then medical therapy without revascularization.    Past Medical History:  Diagnosis Date   AICD (automatic cardioverter/defibrillator) present    CAD (coronary artery disease), native coronary artery    chronically occluded mid LAD with left to left collaterals.    Cerebral aneurysm    s/p embolization   Chronic systolic (congestive) heart failure (HCC)    biventricular HF with moderate RV dysfunction and severe LV dysfunction by Cardiac MRI with EF 22% no viability in the apical septal/apical/inferior,mid anterior and true apex.     Diabetes mellitus without complication (HCC)    on Jardiance   Gout    HLD (hyperlipidemia)    PAF (paroxysmal atrial fibrillation) (HCC)    s/p DCCV 12/2019   Pre-diabetes    diet controlled, no meds   Presence of permanent cardiac pacemaker    Gallant/Abbott Pacemaker, last remote check 10/19/21   Sleep apnea    use cpap nightly    Past Surgical History:  Procedure Laterality Date   CARDIOVERSION N/A 12/28/2019   Procedure: CARDIOVERSION;  Surgeon: Chilton Siandolph, Tiffany, MD;  [facility];  Service: Cardiovascular;  Laterality: N/A;   CARPAL TUNNEL RELEASE Left 04/20/2022   Procedure: Left carpal tunnel release. Left ulnar nerve release.;  Surgeon: Dominica SeverinGramig, William, MD;  Location: [facility] OR;  Service: Orthopedics;  Laterality: Left;  90 mins   COLONOSCOPY     ICD IMPLANT N/A 03/20/2020   Procedure: ICD IMPLANT;  Surgeon: Marinus Mawaylor, Gregg W, MD;  [facility];  Service: Cardiovascular;  Laterality: N/A;   INSERTION OF MESH N/A 06/27/2015   Procedure: INSERTION OF MESH;  Surgeon:  Axel FillerArmando Ramirez, MD;  Location: [facility] ORS;  Service: General;  Laterality: N/A;   IR 3D INDEPENDENT WKST  11/14/2019   IR 3D INDEPENDENT WKST  11/22/2019   IR ANGIO INTRA EXTRACRAN SEL COM CAROTID INNOMINATE BILAT MOD SED  11/14/2019   IR ANGIO INTRA EXTRACRAN SEL INTERNAL CAROTID UNI R MOD SED  11/22/2019   IR ANGIO VERTEBRAL SEL VERTEBRAL BILAT MOD SED  11/14/2019   IR ANGIOGRAM FOLLOW UP STUDY  11/22/2019   IR CT HEAD LTD  11/22/2019   IR NEURO EACH ADD'L AFTER BASIC UNI RIGHT (MS)  11/22/2019   IR TRANSCATH/EMBOLIZ  11/22/2019   laceration to fingers     left hand   MASS EXCISION Left 04/20/2022   Procedure: Left elbow mass removal and bursectomy;  Surgeon: Dominica SeverinGramig, William, MD;  Location: [facility] OR;  Service: Orthopedics;  Laterality: Left;   MOUTH SURGERY     secondary to abscess    RADIOLOGY WITH ANESTHESIA N/A 11/22/2019   Procedure: EMBOLIZATION;  Surgeon: Julieanne Cottoneveshwar, Sanjeev, MD;  Location: [facility] OR;  Service: Radiology;  Laterality:  N/A;   RIGHT/LEFT HEART CATH AND CORONARY ANGIOGRAPHY N/A 11/12/2019   Procedure: RIGHT/LEFT HEART CATH AND CORONARY ANGIOGRAPHY;  Surgeon: Corky Crafts, MD;  Location: [facility] INVASIVE CV LAB;  Service: Cardiovascular;  Laterality: N/A;   TONSILLECTOMY     removed as a child   trauma to lip      sutured   VENTRAL HERNIA REPAIR N/A 06/27/2015   Procedure: LAPAROSCOPIC VENTRAL HERNIA;  Surgeon: Axel Filler, MD;  Location: [facility] ORS;  Service: General;  Laterality: N/A;    MEDICATIONS:  amiodarone (PACERONE) 200 MG tablet   apixaban (ELIQUIS) 5 MG TABS tablet   Ascorbic Acid (VITAMIN C) 1000 MG tablet   colchicine 0.6 MG tablet   Cyanocobalamin (VITAMIN B-12 PO)   diclofenac sodium (VOLTAREN) 1 % GEL   empagliflozin (JARDIANCE) 10 MG TABS tablet   furosemide (LASIX) 20 MG tablet   metoprolol succinate (TOPROL-XL) 100 MG 24 hr tablet   Multiple Vitamins-Minerals (MULTIVITAMIN WITH MINERALS) tablet   Multiple Vitamins-Minerals (ZINC PO)    oxyCODONE-acetaminophen (PERCOCET/ROXICET) 5-325 MG tablet   Pyridoxine HCl (B-6 PO)   rosuvastatin (CRESTOR) 5 MG tablet   sacubitril-valsartan (ENTRESTO) 49-51 MG   sertraline (ZOLOFT) 25 MG tablet   spironolactone (ALDACTONE) 25 MG tablet   tadalafil (CIALIS) 20 MG tablet   No current facility-administered medications for this encounter.    Shonna Chock, PA-C Surgical Short Stay/Anesthesiology [facility] Phone [phone] [facility] Phone [phone] 07/13/2022 12:49 PM

## 2022-07-13 NOTE — Anesthesia Preprocedure Evaluation (Addendum)
Anesthesia Evaluation  Patient identified by MRN, date of birth, ID band Patient awake    Reviewed: Allergy & Precautions, NPO status , Patient's Chart, lab work & pertinent test results  History of Anesthesia Complications Negative for: history of anesthetic complications  Airway Mallampati: II  TM Distance: >3 FB Neck ROM: Full    Dental  (+) Dental Advisory Given   Pulmonary sleep apnea , former smoker   breath sounds clear to auscultation       Cardiovascular hypertension, Pt. on medications and Pt. on home beta blockers + CAD and +CHF  + pacemaker + Cardiac Defibrillator  Rhythm:Regular   1. Septal and apical akinesis no mural apical thrombus Inferior apical  dyskinesis EF and RWMAls similar to echo done 04/22/21. Left ventricular  ejection fraction, by estimation, is 35 to 40%. The left ventricle has  moderately decreased function. The left  ventricle demonstrates regional wall motion abnormalities (see scoring  diagram/findings for description). The left ventricular internal cavity  size was mildly dilated. There is mild asymmetric left ventricular  hypertrophy of the basal and septal  segments. Left ventricular diastolic parameters are consistent with Grade  I diastolic dysfunction (impaired relaxation).   2. AICD wires in RA/RV. Right ventricular systolic function is normal.  The right ventricular size is normal. There is normal pulmonary artery  systolic pressure.   3. The mitral valve is normal in structure. No evidence of mitral valve  regurgitation. No evidence of mitral stenosis.   4. The aortic valve is normal in structure. Aortic valve regurgitation is  not visualized. No aortic stenosis is present.   5. The inferior vena cava is normal in size with greater than 50%  respiratory variability, suggesting right atrial pressure of 3 mmHg.       Neuro/Psych negative neurological ROS  negative psych ROS    GI/Hepatic negative GI ROS, Neg liver ROS,,,  Endo/Other  diabetes  Lab Results      Component                Value               Date                      HGBA1C                   5.8 (H)             07/12/2022             Renal/GU Lab Results      Component                Value               Date                      CREATININE               1.11                07/12/2022             Lab Results      Component                Value               Date  K                        4.7                 07/12/2022                Musculoskeletal   Abdominal   Peds  Hematology negative hematology ROS (+) Lab Results      Component                Value               Date                      WBC                      7.6                 07/12/2022                HGB                      17.2 (H)            07/12/2022                HCT                      53.3 (H)            07/12/2022                MCV                      99.4                07/12/2022                PLT                      210                 07/12/2022              Anesthesia Other Findings   Reproductive/Obstetrics                             Anesthesia Physical Anesthesia Plan  ASA: 3  Anesthesia Plan: General   Post-op Pain Management: Ofirmev IV (intra-op)* and Toradol IV (intra-op)*   Induction: Intravenous  PONV Risk Score and Plan: 2 and Ondansetron and Dexamethasone  Airway Management Planned: LMA and Oral ETT  Additional Equipment: None  Intra-op Plan:   Post-operative Plan: Extubation in OR  Informed Consent: I have reviewed the patients History and Physical, chart, labs and discussed the procedure including the risks, benefits and alternatives for the proposed anesthesia with the patient or authorized representative who has indicated his/her understanding and acceptance.     Dental advisory given  Plan Discussed with:  CRNA  Anesthesia Plan Comments: (PAT note written 07/13/2022 by Shonna Chock, PA-C.  )       Anesthesia Quick Evaluation

## 2022-07-20 ENCOUNTER — Other Ambulatory Visit (HOSPITAL_COMMUNITY): Payer: Self-pay | Admitting: *Deleted

## 2022-07-20 ENCOUNTER — Other Ambulatory Visit (HOSPITAL_COMMUNITY): Payer: Self-pay

## 2022-07-20 MED ORDER — EMPAGLIFLOZIN 10 MG PO TABS
10.0000 mg | ORAL_TABLET | Freq: Every day | ORAL | 11 refills | Status: DC
  Filled 2022-07-20: qty 30, 30d supply, fill #0
  Filled 2022-07-28: qty 90, 90d supply, fill #1
  Filled 2022-08-19: qty 30, 30d supply, fill #1
  Filled 2022-09-13: qty 90, 90d supply, fill #2
  Filled 2022-12-14: qty 90, 90d supply, fill #3
  Filled 2023-03-14: qty 90, 90d supply, fill #4
  Filled 2023-06-08: qty 30, 30d supply, fill #5

## 2022-07-21 ENCOUNTER — Other Ambulatory Visit (HOSPITAL_COMMUNITY): Payer: Self-pay

## 2022-07-22 ENCOUNTER — Telehealth (HOSPITAL_COMMUNITY): Payer: Self-pay

## 2022-07-22 ENCOUNTER — Other Ambulatory Visit: Payer: Self-pay

## 2022-07-22 NOTE — Progress Notes (Signed)
Surgical Instructions                 Your procedure is scheduled on Friday, December 8th, 2023.             Report to [facility] Main Entrance "A" at 12:30 the day of surgery, then check in with the Admitting office.             Call this number if you have problems the morning of surgery:             [phone]    If you have any questions prior to your surgery date call [phone]: Open Monday-Friday 8am-4pm                 Remember:             Do not eat after midnight the night before your surgery   You may drink clear liquids until 11:30 AM the day of your surgery.   Clear liquids allowed are: Water, Non-Citrus Juices (without pulp), Carbonated Beverages, Clear Tea, Black Coffee Only (NO MILK, CREAM OR POWDERED CREAMER of any kind), and Gatorade.     Patient Instructions   The night before surgery:  No food after midnight. ONLY clear liquids after midnight       The day of surgery (if you have diabetes): Drink ONE (1) 12 oz G2 given to you in your pre admission testing appointment by 11:30 PM the morning of surgery. Drink in one sitting. Do not sip.  This drink was given to you during your hospital  pre-op appointment visit.  Nothing else to drink after completing the  12 oz bottle of G2.          If you have questions, please contact your surgeon's office.                           Take these medicines the morning of surgery with A  SIP OF WATER   amiodarone (PACERONE)   metoprolol succinate (TOPROL-XL)  oxyCODONE-acetaminophen (PERCOCET/ROXICET)  rosuvastatin (CRESTOR)      If needed: colchicine    Follow your surgeon's instructions on when to stop Eliquis.  If no instructions were given by your surgeon then you will need to call the office to get those instructions.      As of today, STOP taking any Aspirin (unless otherwise instructed by your surgeon) Aleve, Naproxen, Ibuprofen, Motrin, Advil, Goody's, BC's, all herbal medications, fish oil, and all  vitamins. This includes diclofenac sodium (VOLTAREN) 1 % GEL.            WHAT DO I DO ABOUT MY DIABETES MEDICATION?     Hold empagliflozin (JARDIANCE) for 72 hours prior to surgery. Last dose 12/1.     HOW TO MANAGE YOUR DIABETES BEFORE AND AFTER SURGERY   Why is it important to control my blood sugar before and after surgery? Improving blood sugar levels before and after surgery helps healing and can limit problems. A way of improving blood sugar control is eating a healthy diet by:  Eating less sugar and carbohydrates  Increasing activity/exercise  Talking with your doctor about reaching your blood sugar goals High blood sugars (greater than 180 mg/dL) can raise your risk of infections and slow your recovery, so you will need to focus on controlling your diabetes during the weeks before surgery. Make sure that the doctor who takes care of your diabetes knows about your  planned surgery including the date and location.   How do I manage my blood sugar before surgery? Check your blood sugar at least 4 times a day, starting 2 days before surgery, to make sure that the level is not too high or low.   Check your blood sugar the morning of your surgery when you wake up and every 2 hours until you get to the Short Stay unit.   If your blood sugar is less than 70 mg/dL, you will need to treat for low blood sugar: Do not take insulin. Treat a low blood sugar (less than 70 mg/dL) with  cup of clear juice (cranberry or apple), 4 glucose tablets, OR glucose gel. Recheck blood sugar in 15 minutes after treatment (to make sure it is greater than 70 mg/dL). If your blood sugar is not greater than 70 mg/dL on recheck, call [phone] for further instructions. Report your blood sugar to the short stay nurse when you get to Short Stay.   If you are admitted to the hospital after surgery: Your blood sugar will be checked by the staff and you will probably be given insulin after surgery (instead of  oral diabetes medicines) to make sure you have good blood sugar levels. The goal for blood sugar control after surgery is 80-180 mg/dL.             Do NOT Smoke (Tobacco/Vaping) for 24 hours prior to your procedure.   If you use a CPAP at night, you may bring your mask/headgear for your overnight stay.   Contacts, glasses, piercing's, hearing aid's, dentures or partials may not be worn into surgery, please bring cases for these belongings.    For patients admitted to the hospital, discharge time will be determined by your treatment team.   Patients discharged the day of surgery will not be allowed to drive home, and someone needs to stay with them for 24 hours.   SURGICAL WAITING ROOM VISITATION Patients having surgery or a procedure may have no more than 2 support people in the waiting area - these visitors may rotate.   Children under the age of 43 must have an adult with them who is not the patient. If the patient needs to stay at the hospital during part of their recovery, the visitor guidelines for inpatient rooms apply. Pre-op nurse will coordinate an appropriate time for 1 support person to accompany patient in pre-op.  This support person may not rotate.    Please refer to the [facility] website for the visitor guidelines for Inpatients (after your surgery is over and you are in a regular room).      Special instructions:   [place]- Preparing For Surgery   Before surgery, you can play an important role. Because skin is not sterile, your skin needs to be as free of germs as possible. You can reduce the number of germs on your skin by washing with CHG (chlorahexidine gluconate) Soap before surgery.  CHG is an antiseptic cleaner which kills germs and bonds with the skin to continue killing germs even after washing.     Oral Hygiene is also important to reduce your risk of infection.  Remember - BRUSH YOUR TEETH THE MORNING OF SURGERY WITH YOUR REGULAR TOOTHPASTE   Please do not  use if you have an allergy to CHG or antibacterial soaps. If your skin becomes reddened/irritated stop using the CHG.  Do not shave (including legs and underarms) for at least 48 hours prior to first  CHG shower. It is OK to shave your face.   Please follow these instructions carefully.                                                                                                                               Shower the NIGHT BEFORE SURGERY and the MORNING OF SURGERY   If you chose to wash your hair, wash your hair first as usual with your normal shampoo.   After you shampoo, rinse your hair and body thoroughly to remove the shampoo.   Use CHG Soap as you would any other liquid soap. You can apply CHG directly to the skin and wash gently with a scrungie or a clean washcloth.    Apply the CHG Soap to your body ONLY FROM THE NECK DOWN.  Do not use on open wounds or open sores. Avoid contact with your eyes, ears, mouth and genitals (private parts). Wash Face and genitals (private parts)  with your normal soap.    Wash thoroughly, paying special attention to the area where your surgery will be performed.   Thoroughly rinse your body with warm water from the neck down.   DO NOT shower/wash with your normal soap after using and rinsing off the CHG Soap.   Pat yourself dry with a CLEAN TOWEL.   Wear CLEAN PAJAMAS to bed the night before surgery   Place CLEAN SHEETS on your bed the night before your surgery   DO NOT SLEEP WITH PETS.     Day of Surgery: Take a shower with CHG soap. Do not wear jewelry  Do not wear lotions, powders, colognes, or deodorant.  Men may shave face and neck. Do not bring valuables to the hospital. [facility] is not responsible for any belongings or valuables.   Wear Clean/Comfortable clothing the morning of surgery Remember to brush your teeth WITH YOUR REGULAR TOOTHPASTE.   Please read over the following fact sheets that you were given.       If you  received a COVID test during your pre-op visit  it is requested that you wear a mask when out in public, stay away from anyone that may not be feeling well and notify your surgeon if you develop symptoms. If you have been in contact with anyone that has tested positive in the last 10 days please notify you surgeon.

## 2022-07-22 NOTE — Progress Notes (Signed)
PCP - Dr. [name] Cardiologist - Dr. Armanda Magic   PPM/ICD - ICD (St Jude/Abbott) Device Orders - received Rep Notified - not needed   Chest x-ray - 03/20/20 EKG - 10/27/21 Stress Test - 07/01/20 ECHO - 07/23/21 Cardiac Cath - 11/12/19   Sleep Study - 05/21/20 OSA+ CPAP - nightly   DM- Type 2 (Pt claims he is not diabetic and only takes Jardiance "for his kidneys." He does not check CBG at home     Last dose of GLP1 agonist-  n/a     Blood Thinner Instructions: Eliquis - last dose -  07/18/22 Aspirin Instructions: Patient was instructed: As of today, STOP taking any Aspirin (unless otherwise instructed by your surgeon) Aleve, Naproxen, Ibuprofen, Motrin, Advil, Goody's, BC's, all herbal medications, fish oil, and all vitamins. This includes diclofenac sodium (VOLTAREN) 1 % GEL.    ERAS Protcol - yes PRE-SURGERY G2- given at PAT   COVID TEST- n/a     Anesthesia review: yes, cardiac hx - the chart was reviewed by Shonna Chock, PA-C on 07/13/22    Patient denies shortness of breath, fever, cough and chest pain at PAT appointment     All instructions explained to the patient, with a verbal understanding of the material. Patient agrees to go over the instructions while at home for a better understanding. The opportunity to ask questions was provided.

## 2022-07-22 NOTE — Telephone Encounter (Signed)
Advanced Heart Failure Patient Advocate Encounter  Medication Samples have been left at registration desk for patient pick up. Drug name: Eliquis 5MG  Qty: 4x (14 ct) package LOT: Exp.: 12/2023 SIG: Take 1 tablet by mouth twice daily   The patient has been instructed regarding the correct time, dose, and frequency of taking this medication, including desired effects and most common side effects.   01/2024, CPhT Rx Patient Advocate Phone: [phone]

## 2022-07-23 ENCOUNTER — Ambulatory Visit (HOSPITAL_BASED_OUTPATIENT_CLINIC_OR_DEPARTMENT_OTHER): Payer: Medicare Other | Admitting: Physician Assistant

## 2022-07-23 ENCOUNTER — Encounter (HOSPITAL_COMMUNITY): Payer: Self-pay | Admitting: Orthopedic Surgery

## 2022-07-23 ENCOUNTER — Encounter (HOSPITAL_COMMUNITY)
Admission: RE | Disposition: A | Discharge status: Home / Self Care | Payer: Self-pay | Source: Home / Self Care | Attending: Orthopedic Surgery

## 2022-07-23 ENCOUNTER — Ambulatory Visit (HOSPITAL_COMMUNITY): Payer: Medicare Other | Admitting: Vascular Surgery

## 2022-07-23 ENCOUNTER — Other Ambulatory Visit: Payer: Self-pay

## 2022-07-23 ENCOUNTER — Ambulatory Visit (HOSPITAL_COMMUNITY)
Admission: RE | Admit: 2022-07-23 | Discharge: 2022-07-23 | Disposition: A | Discharge status: Home / Self Care | Payer: Medicare Other | Attending: Orthopedic Surgery | Admitting: Orthopedic Surgery

## 2022-07-23 DIAGNOSIS — I2510 Atherosclerotic heart disease of native coronary artery without angina pectoris: Secondary | ICD-10-CM | POA: Insufficient documentation

## 2022-07-23 DIAGNOSIS — G5601 Carpal tunnel syndrome, right upper limb: Secondary | ICD-10-CM | POA: Insufficient documentation

## 2022-07-23 DIAGNOSIS — G4730 Sleep apnea, unspecified: Secondary | ICD-10-CM | POA: Diagnosis not present

## 2022-07-23 DIAGNOSIS — E119 Type 2 diabetes mellitus without complications: Secondary | ICD-10-CM | POA: Diagnosis not present

## 2022-07-23 DIAGNOSIS — R2231 Localized swelling, mass and lump, right upper limb: Secondary | ICD-10-CM

## 2022-07-23 DIAGNOSIS — Z79899 Other long term (current) drug therapy: Secondary | ICD-10-CM | POA: Diagnosis not present

## 2022-07-23 DIAGNOSIS — Z87891 Personal history of nicotine dependence: Secondary | ICD-10-CM | POA: Diagnosis not present

## 2022-07-23 DIAGNOSIS — M7031 Other bursitis of elbow, right elbow: Secondary | ICD-10-CM | POA: Insufficient documentation

## 2022-07-23 DIAGNOSIS — G5621 Lesion of ulnar nerve, right upper limb: Secondary | ICD-10-CM | POA: Diagnosis not present

## 2022-07-23 DIAGNOSIS — I110 Hypertensive heart disease with heart failure: Secondary | ICD-10-CM | POA: Insufficient documentation

## 2022-07-23 DIAGNOSIS — I509 Heart failure, unspecified: Secondary | ICD-10-CM | POA: Insufficient documentation

## 2022-07-23 DIAGNOSIS — M1A0211 Idiopathic chronic gout, right elbow, with tophus (tophi): Secondary | ICD-10-CM | POA: Diagnosis not present

## 2022-07-23 DIAGNOSIS — M7021 Olecranon bursitis, right elbow: Secondary | ICD-10-CM | POA: Diagnosis not present

## 2022-07-23 DIAGNOSIS — Z95810 Presence of automatic (implantable) cardiac defibrillator: Secondary | ICD-10-CM | POA: Diagnosis not present

## 2022-07-23 DIAGNOSIS — M7989 Other specified soft tissue disorders: Secondary | ICD-10-CM | POA: Diagnosis not present

## 2022-07-23 HISTORY — PX: EXCISION MASS UPPER EXTREMETIES: SHX6704

## 2022-07-23 HISTORY — PX: CARPAL TUNNEL WITH CUBITAL TUNNEL: SHX5608

## 2022-07-23 LAB — GLUCOSE, CAPILLARY
Glucose-Capillary: 126 mg/dL — ABNORMAL HIGH (ref 70–99)
Glucose-Capillary: 102 mg/dL — ABNORMAL HIGH (ref 70–99)

## 2022-07-23 SURGERY — RELEASE, CARPAL TUNNEL AND CUBITAL TUNNEL
Anesthesia: General | Site: Arm Lower | Laterality: Right

## 2022-07-23 MED ORDER — DEXAMETHASONE SODIUM PHOSPHATE 10 MG/ML IJ SOLN
INTRAMUSCULAR | Status: AC
  Filled 2022-07-23: qty 2

## 2022-07-23 MED ORDER — ROCURONIUM BROMIDE 10 MG/ML (PF) SYRINGE
PREFILLED_SYRINGE | INTRAVENOUS | Status: AC
  Filled 2022-07-23: qty 20

## 2022-07-23 MED ORDER — BUPIVACAINE HCL (PF) 0.25 % IJ SOLN
INTRAMUSCULAR | Status: AC
  Filled 2022-07-23: qty 30

## 2022-07-23 MED ORDER — OXYCODONE HCL 5 MG PO TABS
ORAL_TABLET | ORAL | Status: AC
  Filled 2022-07-23: qty 1

## 2022-07-23 MED ORDER — FENTANYL CITRATE (PF) 250 MCG/5ML IJ SOLN
INTRAMUSCULAR | Status: DC | PRN
  Administered 2022-07-23: 50 ug via INTRAVENOUS
  Administered 2022-07-23 (×2): 25 ug via INTRAVENOUS
  Administered 2022-07-23 (×3): 50 ug via INTRAVENOUS

## 2022-07-23 MED ORDER — LACTATED RINGERS IV SOLN: INTRAVENOUS | Status: DC

## 2022-07-23 MED ORDER — OXYCODONE HCL 5 MG PO TABS
5.0000 mg | ORAL_TABLET | Freq: Once | ORAL | Status: AC | PRN
  Administered 2022-07-23: 5 mg via ORAL

## 2022-07-23 MED ORDER — MIDAZOLAM HCL 2 MG/2ML IJ SOLN
INTRAMUSCULAR | Status: AC
  Filled 2022-07-23: qty 2

## 2022-07-23 MED ORDER — LIDOCAINE 2% (20 MG/ML) 5 ML SYRINGE
INTRAMUSCULAR | Status: DC | PRN
  Administered 2022-07-23: 80 mg via INTRAVENOUS

## 2022-07-23 MED ORDER — PHENYLEPHRINE 80 MCG/ML (10ML) SYRINGE FOR IV PUSH (FOR BLOOD PRESSURE SUPPORT)
PREFILLED_SYRINGE | INTRAVENOUS | Status: DC | PRN
  Administered 2022-07-23 (×2): 80 ug via INTRAVENOUS
  Administered 2022-07-23: 160 ug via INTRAVENOUS
  Administered 2022-07-23: 80 ug via INTRAVENOUS

## 2022-07-23 MED ORDER — FENTANYL CITRATE (PF) 100 MCG/2ML IJ SOLN
INTRAMUSCULAR | Status: AC
  Filled 2022-07-23: qty 2

## 2022-07-23 MED ORDER — ACETAMINOPHEN 10 MG/ML IV SOLN
INTRAVENOUS | Status: DC | PRN
  Administered 2022-07-23: 1000 mg via INTRAVENOUS

## 2022-07-23 MED ORDER — DEXAMETHASONE SODIUM PHOSPHATE 10 MG/ML IJ SOLN
INTRAMUSCULAR | Status: DC | PRN
  Administered 2022-07-23: 10 mg via INTRAVENOUS

## 2022-07-23 MED ORDER — KETOROLAC TROMETHAMINE 30 MG/ML IJ SOLN
INTRAMUSCULAR | Status: DC | PRN
  Administered 2022-07-23: 30 mg via INTRAVENOUS

## 2022-07-23 MED ORDER — PROPOFOL 10 MG/ML IV BOLUS
INTRAVENOUS | Status: DC | PRN
  Administered 2022-07-23: 200 mg via INTRAVENOUS

## 2022-07-23 MED ORDER — PHENYLEPHRINE HCL-NACL 20-0.9 MG/250ML-% IV SOLN
INTRAVENOUS | Status: DC | PRN
  Administered 2022-07-23: 20 ug/min via INTRAVENOUS

## 2022-07-23 MED ORDER — 0.9 % SODIUM CHLORIDE (POUR BTL) OPTIME
TOPICAL | Status: DC | PRN
  Administered 2022-07-23 (×2): 1000 mL

## 2022-07-23 MED ORDER — ONDANSETRON HCL 4 MG/2ML IJ SOLN
INTRAMUSCULAR | Status: AC
  Filled 2022-07-23: qty 4

## 2022-07-23 MED ORDER — KETOROLAC TROMETHAMINE 30 MG/ML IJ SOLN
INTRAMUSCULAR | Status: AC
  Filled 2022-07-23: qty 1

## 2022-07-23 MED ORDER — CHLORHEXIDINE GLUCONATE 0.12 % MT SOLN
15.0000 mL | Freq: Once | OROMUCOSAL | Status: AC
  Administered 2022-07-23: 15 mL via OROMUCOSAL
  Filled 2022-07-23: qty 15

## 2022-07-23 MED ORDER — ACETAMINOPHEN 10 MG/ML IV SOLN: 1000.0000 mg | Freq: Once | INTRAVENOUS | Status: DC | PRN

## 2022-07-23 MED ORDER — PHENYLEPHRINE 80 MCG/ML (10ML) SYRINGE FOR IV PUSH (FOR BLOOD PRESSURE SUPPORT)
PREFILLED_SYRINGE | INTRAVENOUS | Status: AC
  Filled 2022-07-23: qty 10

## 2022-07-23 MED ORDER — MIDAZOLAM HCL 2 MG/2ML IJ SOLN
INTRAMUSCULAR | Status: DC | PRN
  Administered 2022-07-23: 2 mg via INTRAVENOUS

## 2022-07-23 MED ORDER — FENTANYL CITRATE (PF) 250 MCG/5ML IJ SOLN
INTRAMUSCULAR | Status: AC
  Filled 2022-07-23: qty 5

## 2022-07-23 MED ORDER — FENTANYL CITRATE (PF) 100 MCG/2ML IJ SOLN
25.0000 ug | INTRAMUSCULAR | Status: DC | PRN
  Administered 2022-07-23 (×3): 50 ug via INTRAVENOUS

## 2022-07-23 MED ORDER — ONDANSETRON HCL 4 MG/2ML IJ SOLN
INTRAMUSCULAR | Status: DC | PRN
  Administered 2022-07-23: 4 mg via INTRAVENOUS

## 2022-07-23 MED ORDER — LIDOCAINE 2% (20 MG/ML) 5 ML SYRINGE
INTRAMUSCULAR | Status: AC
  Filled 2022-07-23: qty 20

## 2022-07-23 MED ORDER — ACETAMINOPHEN 160 MG/5ML PO SOLN: 1000.0000 mg | Freq: Once | ORAL | Status: DC | PRN

## 2022-07-23 MED ORDER — ACETAMINOPHEN 500 MG PO TABS: 1000.0000 mg | ORAL_TABLET | Freq: Once | ORAL | Status: DC | PRN

## 2022-07-23 MED ORDER — ORAL CARE MOUTH RINSE: 15.0000 mL | Freq: Once | OROMUCOSAL | Status: AC

## 2022-07-23 MED ORDER — OXYCODONE HCL 5 MG/5ML PO SOLN: 5.0000 mg | Freq: Once | ORAL | Status: AC | PRN

## 2022-07-23 MED ORDER — BUPIVACAINE HCL (PF) 0.25 % IJ SOLN
INTRAMUSCULAR | Status: DC | PRN
  Administered 2022-07-23: 15 mL

## 2022-07-23 MED ORDER — PROPOFOL 10 MG/ML IV BOLUS
INTRAVENOUS | Status: AC
  Filled 2022-07-23: qty 20

## 2022-07-23 MED ORDER — CEFAZOLIN SODIUM-DEXTROSE 2-4 GM/100ML-% IV SOLN
2.0000 g | INTRAVENOUS | Status: AC
  Administered 2022-07-23: 2 g via INTRAVENOUS
  Filled 2022-07-23: qty 100

## 2022-07-23 SURGICAL SUPPLY — 59 items
BAG COUNTER SPONGE SURGICOUNT (BAG) ×1 IMPLANT
BAG SPNG CNTER NS LX DISP (BAG) ×1
BANDAGE ESMARK 6X9 LF (GAUZE/BANDAGES/DRESSINGS) IMPLANT
BNDG CMPR 9X6 STRL LF SNTH (GAUZE/BANDAGES/DRESSINGS) ×1
BNDG ELASTIC 3X5.8 VLCR STR LF (GAUZE/BANDAGES/DRESSINGS) ×2 IMPLANT
BNDG ELASTIC 4X5.8 VLCR STR LF (GAUZE/BANDAGES/DRESSINGS) ×1 IMPLANT
BNDG ESMARK 6X9 LF (GAUZE/BANDAGES/DRESSINGS) ×1
BNDG GAUZE DERMACEA FLUFF 4 (GAUZE/BANDAGES/DRESSINGS) ×3 IMPLANT
BNDG GZE DERMACEA 4 6PLY (GAUZE/BANDAGES/DRESSINGS)
CORD BIPOLAR FORCEPS 12FT (ELECTRODE) ×1 IMPLANT
COVER SURGICAL LIGHT HANDLE (MISCELLANEOUS) ×1 IMPLANT
CUFF TOURN SGL QUICK 18X4 (TOURNIQUET CUFF) ×1 IMPLANT
CUFF TOURN SGL QUICK 24 (TOURNIQUET CUFF)
CUFF TRNQT CYL 24X4X16.5-23 (TOURNIQUET CUFF) IMPLANT
DRAPE SURG 17X23 STRL (DRAPES) ×1 IMPLANT
DRSG ADAPTIC 3X8 NADH LF (GAUZE/BANDAGES/DRESSINGS) IMPLANT
EVACUATOR 1/8 PVC DRAIN (DRAIN) IMPLANT
GAUZE SPONGE 4X4 12PLY STRL (GAUZE/BANDAGES/DRESSINGS) ×1 IMPLANT
GAUZE XEROFORM 1X8 LF (GAUZE/BANDAGES/DRESSINGS) ×1 IMPLANT
GAUZE XEROFORM 5X9 LF (GAUZE/BANDAGES/DRESSINGS) IMPLANT
GLOVE BIOGEL M 8.0 STRL (GLOVE) ×1 IMPLANT
GLOVE SS BIOGEL STRL SZ 8 (GLOVE) ×1 IMPLANT
GOWN STRL REUS W/ TWL LRG LVL3 (GOWN DISPOSABLE) ×2 IMPLANT
GOWN STRL REUS W/ TWL XL LVL3 (GOWN DISPOSABLE) ×3 IMPLANT
GOWN STRL REUS W/TWL LRG LVL3 (GOWN DISPOSABLE) ×1
GOWN STRL REUS W/TWL XL LVL3 (GOWN DISPOSABLE) ×1
KIT BASIN OR (CUSTOM PROCEDURE TRAY) ×1 IMPLANT
KIT TURNOVER KIT B (KITS) ×1 IMPLANT
LOOP VESSEL MAXI BLUE (MISCELLANEOUS) IMPLANT
NDL HYPO 25GX1X1/2 BEV (NEEDLE) IMPLANT
NEEDLE HYPO 25GX1X1/2 BEV (NEEDLE) ×1 IMPLANT
NS IRRIG 1000ML POUR BTL (IV SOLUTION) ×1 IMPLANT
PACK ORTHO EXTREMITY (CUSTOM PROCEDURE TRAY) ×1 IMPLANT
PAD ARMBOARD 7.5X6 YLW CONV (MISCELLANEOUS) ×2 IMPLANT
PAD CAST 3X4 CTTN HI CHSV (CAST SUPPLIES) IMPLANT
PAD CAST 4YDX4 CTTN HI CHSV (CAST SUPPLIES) ×2 IMPLANT
PADDING CAST COTTON 3X4 STRL (CAST SUPPLIES) ×1
PADDING CAST COTTON 4X4 STRL (CAST SUPPLIES) ×3
PILLOW ARM CARTER ADULT (MISCELLANEOUS) IMPLANT
SLING ARM FOAM STRAP XLG (SOFTGOODS) IMPLANT
SOL PREP POV-IOD 4OZ 10% (MISCELLANEOUS) ×3 IMPLANT
SPLINT FIBERGLASS 4X30 (CAST SUPPLIES) IMPLANT
SUCTION FRAZIER HANDLE 10FR (MISCELLANEOUS)
SUCTION TUBE FRAZIER 10FR DISP (MISCELLANEOUS) IMPLANT
SUT FIBERWIRE 3-0 18 DIAM 3/8 (SUTURE) ×1
SUT PROLENE 3 0 PS 2 (SUTURE) IMPLANT
SUT PROLENE 4 0 PS 2 18 (SUTURE) ×1 IMPLANT
SUT VIC AB 2-0 CT1 27 (SUTURE) ×1
SUT VIC AB 2-0 CT1 TAPERPNT 27 (SUTURE) IMPLANT
SUT VIC AB 3-0 FS2 27 (SUTURE) IMPLANT
SUTURE FIBERWR 3-0 18 DIAM 3/8 (SUTURE) IMPLANT
SYR CONTROL 10ML LL (SYRINGE) IMPLANT
SYSTEM CHEST DRAIN TLS 7FR (DRAIN) IMPLANT
TOWEL GREEN STERILE (TOWEL DISPOSABLE) ×1 IMPLANT
TOWEL GREEN STERILE FF (TOWEL DISPOSABLE) ×1 IMPLANT
TUBE CONNECTING 12X1/4 (SUCTIONS) IMPLANT
TUBE EVACUATION TLS (MISCELLANEOUS) ×1 IMPLANT
UNDERPAD 30X36 HEAVY ABSORB (UNDERPADS AND DIAPERS) ×1 IMPLANT
WATER STERILE IRR 1000ML POUR (IV SOLUTION) ×1 IMPLANT

## 2022-07-23 NOTE — Transfer of Care (Signed)
Immediate Anesthesia Transfer of Care Note  Patient: Richard Graham  Procedure(s) Performed: RIGHT CARPAL TUNNEL RELEASE (Right: Arm Lower) RIGHT ELBOW MASS EXCISION WITH BURSECTOMY AND RIGHT ULNAR NERVE RELEASE AT THE ELBOW/CUBITAL TUNNEL (Right: Arm Lower)  Patient Location: PACU  Anesthesia Type:General  Level of Consciousness: awake and oriented  Airway & Oxygen Therapy: Patient Spontanous Breathing  Post-op Assessment: Report given to RN and Post -op Vital signs reviewed and stable  Post vital signs: Reviewed and stable  Last Vitals:  Vitals Value Taken Time  BP 124/91 07/23/22 1645  Temp    Pulse 68 07/23/22 1647  Resp 19 07/23/22 1647  SpO2 92 % 07/23/22 1647  Vitals shown include unvalidated device data.  Last Pain:  Vitals:   07/23/22 1309  TempSrc:   PainSc: 0-No pain         Complications: No notable events documented.

## 2022-07-23 NOTE — Op Note (Signed)
Operative note July 23, 2022.  Dominica Severin, MD.  Date of surgery and dictation July 23, 2022.  Preoperative diagnosis: Right elbow bursitis.  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  Ulnar nerve neuropathy right elbow.  Carpal tunnel syndrome right upper extremity.  Postop diagnosis: The same.  Operative procedure #1 right open carpal tunnel release #2 ulnar nerve release in situ at the elbow right upper extremity.  #3 bursectomy right elbow #4 removal of a 10 x 8 tophaceous mass right elbow deep in nature.  #5 triceps debridement and triceps repair right elbow secondary to gout impregnation and degeneration from the gout disease.  Surgeon Dominica Severin  Anesthesia General  Tourniquet time less than an hour.  Specimens 1.  Description of procedure: Patient was seen by myself and anesthesia.  He was taken to the operative theater and underwent a smooth induction of general anesthesia.  He was prepped and draped in usual sterile fashion with Hibiclens scrub followed by Betadine scrub.  Preoperative antibiotics were given and timeout was observed.  The patient had the operation commenced with tourniquet insufflation to 250 mmHg.  I performed a incision at the transverse carpal ligament region.  Dissection was carried down.  Palmar fascia was incised.  Distal edge was released and fat pad aggress.  I verified the palmar arch.  I dissected in a distal to proximal direction until adequate room was available to release the proximal leaflet.  This was done without difficulty.  The median nerve was hyperemic intact and there was pre-existing abductor pollicis brevis weakness and atrophy should note.  There is no aberrant anatomy.  The patient had a very thickened band proximally and thus I made a small counterincision and released the antebrachial fascia into the forearm through the small transverse incision.  I deflated the tourniquet secured hemostasis and closed  wounds with Prolene with 5 cc of Sensorcaine without epinephrine placed in the wound.  Patient tolerated this well.  Following this the patient then underwent placement of the arm in a modified sloppy lateral position.  Tourniquet was insufflated and a posterior midline incision was made just this was carried down the patient had a significant bursal accumulation removed distally and proximally.  This allowed for access to the gouty mass which was impregnated against the triceps.  At this time I identified the ulnar nerve proximally.  The ulnar nerve was released about the medial intermuscular septum, Osborne's ligament, cubital tunnel, arcade of Struthers, and the 2 heads of the FCU superficial and deep.  The ulnar nerve was identified and kept intact at all times.  Following this our move the 10 x 8 cm large deep mass.  This was sent for specimen this was a gouty mass deep in location.  Following this I then remove gout from the triceps tendon where it had impregnated and fragility of the tendon was noted.  This was debrided and the tendon was repaired with 3-0 FiberWire.  This was a triceps tendon repair secondary to gout impregnation and longitudinal injury.  Following this I performed additional bursectomy.  The tourniquet was deflated and hemostasis was obtained with bipolar electrocautery.  Drain was placed and wound was sutured after hemostasis was secured.  10 cc of Sensorcaine was placed in the wound for postop analgesia.  Sterile bandage was applied.  Compartments were soft refill was excellent and there were no complicating features.  I was pleased with the hemostasis but will be concerned over any hematoma formation.  Thus a drain was placed.  They will remove the drain tomorrow.  In 48 hours he will begin his Eliquis.  He will see me in 14 days.  Standard rehab of compression garment posteriorly and keep his elbow off of hard surfaces for concern of seroma and hematoma given the large  defect that is there.  I discussed these issues with him at length and the findings.  Should any problems occur he will notify me.  It was a pleasure to see him today.  Discharge medicines oxycodone Zofran Robaxin and Keflex.  It was a pleasure to see him and treat him.  Should any problems arise he will notify me.  He had a previous surgical then about the left side and we want to rehab him according to the left side.  Should any problems occur he will notify us.  Dominica Severin, MD

## 2022-07-23 NOTE — Discharge Instructions (Signed)
Please see Dr. Carlos Levering discharge instructions that were you adopted earlier when we repaired the left side.  Please call Dr. Amanda Pea if you have any problems.  His cell phone number is  [phone]  Keep bandage clean and dry.  Call for any problems.  No smoking.  Criteria for driving a car: you should be off your pain medicine for 7-8 hours, able to drive one handed(confident), thinking clearly and feeling able in your judgement to drive. Continue elevation as it will decrease swelling.  If instructed by MD move your fingers within the confines of the bandage/splint.  Use ice if instructed by your MD. Call immediately for any sudden loss of feeling in your hand/arm or change in functional abilities of the extremity. We recommend that you to take vitamin C 1000 mg a day to promote healing. We also recommend that if you require  pain medicine that you take a stool softener to prevent constipation as most pain medicines will have constipation side effects. We recommend either Peri-Colace or Senokot and recommend that you also consider adding MiraLAX as well to prevent the constipation affects from pain medicine if you are required to use them. These medicines are over the counter and may be purchased at a local pharmacy. A cup of yogurt and a probiotic can also be helpful during the recovery process as the medicines can disrupt your intestinal environment.

## 2022-07-23 NOTE — Anesthesia Procedure Notes (Signed)
Procedure Name: LMA Insertion Date/Time: 07/23/2022 2:50 PM  Performed by: Maxine Tell, CRNAPre-anesthesia Checklist: Patient identified, Emergency Drugs available, Suction available and Patient being monitored Patient Re-evaluated:Patient Re-evaluated prior to induction Oxygen Delivery Method: Circle System Utilized Preoxygenation: Pre-oxygenation with 100% oxygen Induction Type: IV induction Ventilation: Mask ventilation without difficulty LMA: LMA inserted LMA Size: 5.0 Number of attempts: 1 Airway Equipment and Method: Bite block Placement Confirmation: positive ETCO2 Tube secured with: Tape Dental Injury: Teeth and Oropharynx as per pre-operative assessment

## 2022-07-23 NOTE — H&P (Signed)
Richard Graham is an 66 y.o. male.   Chief Complaint: The patient presents for right carpal tunnel release, right ulnar nerve release, right elbow mass removal with bursectomy. HPI: Patient presents for evaluation and treatment of the of their upper extremity predicament. The patient denies neck, back, chest or  abdominal pain. The patient notes that they have no lower extremity problems. The patients primary complaint is noted. We are planning surgical care pathway for the upper extremity.   Past Medical History:  Diagnosis Date   AICD (automatic cardioverter/defibrillator) present    CAD (coronary artery disease), native coronary artery    chronically occluded mid LAD with left to left collaterals.    Cerebral aneurysm    s/p embolization   Chronic systolic (congestive) heart failure (HCC)    biventricular HF with moderate RV dysfunction and severe LV dysfunction by Cardiac MRI with EF 22% no viability in the apical septal/apical/inferior,mid anterior and true apex.     Diabetes mellitus without complication (HCC)    on Jardiance   Gout    HLD (hyperlipidemia)    PAF (paroxysmal atrial fibrillation) (HCC)    s/p DCCV 12/2019   Pre-diabetes    diet controlled, no meds   Presence of permanent cardiac pacemaker    Gallant/[employer]ott Pacemaker, last remote check 10/19/21   Sleep apnea    use cpap nightly    Past Surgical History:  Procedure Laterality Date   CARDIOVERSION N/A 12/28/2019   Procedure: CARDIOVERSION;  Surgeon: Chilton Si, MD;  Location: [facility] ENDOSCOPY;  Service: Cardiovascular;  Laterality: N/A;   CARPAL TUNNEL RELEASE Left 04/20/2022   Procedure: Left carpal tunnel release. Left ulnar nerve release.;  Surgeon: Dominica Severin, MD;  Location: [facility] OR;  Service: Orthopedics;  Laterality: Left;  90 mins   COLONOSCOPY     ICD IMPLANT N/A 03/20/2020   Procedure: ICD IMPLANT;  Surgeon: Marinus Maw, MD;  Location: [facility] INVASIVE CV LAB;  Service: Cardiovascular;  Laterality:  N/A;   INSERTION OF MESH N/A 06/27/2015   Procedure: INSERTION OF MESH;  Surgeon: Axel Filler, MD;  Location: [facility] ORS;  Service: General;  Laterality: N/A;   IR 3D INDEPENDENT WKST  11/14/2019   IR 3D INDEPENDENT WKST  11/22/2019   IR ANGIO INTRA EXTRACRAN SEL COM CAROTID INNOMINATE BILAT MOD SED  11/14/2019   IR ANGIO INTRA EXTRACRAN SEL INTERNAL CAROTID UNI R MOD SED  11/22/2019   IR ANGIO VERTEBRAL SEL VERTEBRAL BILAT MOD SED  11/14/2019   IR ANGIOGRAM FOLLOW UP STUDY  11/22/2019   IR CT HEAD LTD  11/22/2019   IR NEURO EACH ADD'L AFTER BASIC UNI RIGHT (MS)  11/22/2019   IR TRANSCATH/EMBOLIZ  11/22/2019   laceration to fingers     left hand   MASS EXCISION Left 04/20/2022   Procedure: Left elbow mass removal and bursectomy;  Surgeon: Dominica Severin, MD;  Location: [facility] OR;  Service: Orthopedics;  Laterality: Left;   MOUTH SURGERY     secondary to abscess    RADIOLOGY WITH ANESTHESIA N/A 11/22/2019   Procedure: EMBOLIZATION;  Surgeon: Julieanne Cotton, MD;  Location: [facility] OR;  Service: Radiology;  Laterality: N/A;   RIGHT/LEFT HEART CATH AND CORONARY ANGIOGRAPHY N/A 11/12/2019   Procedure: RIGHT/LEFT HEART CATH AND CORONARY ANGIOGRAPHY;  Surgeon: Corky Crafts, MD;  Location: [facility] INVASIVE CV LAB;  Service: Cardiovascular;  Laterality: N/A;   TONSILLECTOMY     removed as a child   trauma to lip      sutured  VENTRAL HERNIA REPAIR N/A 06/27/2015   Procedure: LAPAROSCOPIC VENTRAL HERNIA;  Surgeon: Axel Filler, MD;  Location: [facility] ORS;  Service: General;  Laterality: N/A;    History reviewed. No pertinent family history. Social History:  reports that he quit smoking about 10 years ago. His smoking use included cigarettes. He has a 0.75 pack-year smoking history. He has never used smokeless tobacco. He reports current alcohol use of about 2.0 standard drinks of alcohol per week. He reports that he does not use drugs.  Allergies:  Allergies  Allergen Reactions   Influenza  Virus Vaccine Other (See Comments)    Got really Sick    Medications Prior to Admission  Medication Sig Dispense Refill   amiodarone (PACERONE) 200 MG tablet Take 1/2 tablet by mouth Monday through Friday.  Take a WHOLE tablet by mouth Saturday and Sunday. 60 tablet 3   apixaban (ELIQUIS) 5 MG TABS tablet TAKE 1 TABLET (5 MG TOTAL) BY MOUTH 2 TIMES DAILY. 180 tablet 3   Ascorbic Acid (VITAMIN C) 1000 MG tablet Take 1,000 mg by mouth daily. Vitafusion     colchicine 0.6 MG tablet Take 0.6 mg by mouth 2 (two) times daily as needed (for gout flares).      Cyanocobalamin (VITAMIN B-12 PO) Take 1 tablet by mouth daily. Vitafusion     diclofenac sodium (VOLTAREN) 1 % GEL Apply 2 g topically 2 (two) times daily as needed (to affected sites- for arthritis pain).      furosemide (LASIX) 20 MG tablet TAKE 3 TABLETS(60 MG) BY MOUTH DAILY (Patient taking differently: Take 20-40 mg by mouth See admin instructions. Take 40 mg in the morning and 20 mg in the afternoon) 270 tablet 3   metoprolol succinate (TOPROL-XL) 100 MG 24 hr tablet Take 1 tablet (100 mg total) by mouth in the morning AND 0.5 tablets (50 mg total) every evening. Take with or immediately following a meal.. 180 tablet 2   Multiple Vitamins-Minerals (MULTIVITAMIN WITH MINERALS) tablet Take 1 tablet by mouth daily. Vitafusion     Multiple Vitamins-Minerals (ZINC PO) Take 1 tablet by mouth daily.     oxyCODONE-acetaminophen (PERCOCET/ROXICET) 5-325 MG tablet Take 1 tablet by mouth 4 (four) times daily.     Pyridoxine HCl (B-6 PO) Take 1 tablet by mouth daily.     rosuvastatin (CRESTOR) 5 MG tablet TAKE 1 TABLET(5 MG) BY MOUTH DAILY 90 tablet 3   sacubitril-valsartan (ENTRESTO) 49-51 MG TAKE 1 TABLET BY MOUTH 2 TIMES DAILY. 180 tablet 3   spironolactone (ALDACTONE) 25 MG tablet Take 1 tablet (25 mg total) by mouth daily. 90 tablet 3   tadalafil (CIALIS) 20 MG tablet Take 20 mg by mouth daily as needed for erectile dysfunction.     empagliflozin  (JARDIANCE) 10 MG TABS tablet Take 1 tablet (10 mg total) by mouth daily. 30 tablet 11   sertraline (ZOLOFT) 25 MG tablet Take 1 tablet (25 mg total) by mouth daily. (Patient not taking: Reported on 06/09/2022) 30 tablet 3    Results for orders placed or performed during the hospital encounter of 07/23/22 (from the past 48 hour(s))  Glucose, capillary     Status: Abnormal   Collection Time: 07/23/22 12:39 PM  Result Value Ref Range   Glucose-Capillary 126 (H) 70 - 99 mg/dL    Comment: Glucose reference range applies only to samples taken after fasting for at least 8 hours.   Comment 1 Notify RN    Comment 2 Document in Chart  No results found.  Review of Systems  Respiratory: Negative.    Cardiovascular: Negative.   Gastrointestinal: Negative.   Genitourinary: Negative.     Blood pressure 118/77, pulse (!) 56, temperature 98 F (36.7 C), temperature source Oral, resp. rate 20, height 6\' 1"  (1.854 m), weight 119.3 kg, SpO2 96 %. Physical Exam  Large mass right elbow painful in nature.  Positive carpal and cubital tunnel symptomatology about the right and left upper extremities.  No signs of infection.  He had a prior procedure about the left upper extremity doing quite well.  There is no signs of instability dystrophy or infection about the left side.  Will plan to proceed with similar right-sided surgery.  The patient is alert and oriented in no acute distress. The patient complains of pain in the affected upper extremity.  The patient is noted to have a normal HEENT exam. Lung fields show equal chest expansion and no shortness of breath. Abdomen exam is nontender without distention. Lower extremity examination does not show any fracture dislocation or blood clot symptoms. Pelvis is stable and the neck and back are stable and nontender.  Assessment/Plan We will plan to proceed with surgical reconstruction right upper extremity.  All questions have been encouraged and answered  including pre and postop plans.  This will be a carpal tunnel release right upper extremity, right ulnar nerve release, right bursectomy and elbow mass removal.  We are planning surgery for your upper extremity. The risk and benefits of surgery to include risk of bleeding, infection, anesthesia,  damage to normal structures and failure of the surgery to accomplish its intended goals of relieving symptoms and restoring function have been discussed in detail. With this in mind we plan to proceed. I have specifically discussed with the patient the pre-and postoperative regime and the dos and don'ts and risk and benefits in great detail. Risk and benefits of surgery also include risk of dystrophy(CRPS), chronic nerve pain, failure of the healing process to go onto completion and other inherent risks of surgery The relavent the pathophysiology of the disease/injury process, as well as the alternatives for treatment and postoperative course of action has been discussed in great detail with the patient who desires to proceed.  We will do everything in our power to help you (the patient) restore function to the upper extremity. It is a pleasure to see this patient today.   III, MD 07/23/2022, 2:26 PM

## 2022-07-26 ENCOUNTER — Encounter (HOSPITAL_COMMUNITY): Payer: Self-pay | Admitting: Orthopedic Surgery

## 2022-07-27 LAB — SURGICAL PATHOLOGY

## 2022-07-27 NOTE — Anesthesia Postprocedure Evaluation (Signed)
Anesthesia Post Note  Patient: Richard Graham  Procedure(s) Performed: RIGHT CARPAL TUNNEL RELEASE (Right: Arm Lower) RIGHT ELBOW MASS EXCISION WITH BURSECTOMY AND RIGHT ULNAR NERVE RELEASE AT THE ELBOW/CUBITAL TUNNEL (Right: Arm Lower)     Patient location during evaluation: PACU Anesthesia Type: General Level of consciousness: awake and alert Pain management: pain level controlled Vital Signs Assessment: post-procedure vital signs reviewed and stable Respiratory status: spontaneous breathing, nonlabored ventilation, respiratory function stable and patient connected to nasal cannula oxygen Cardiovascular status: blood pressure returned to baseline and stable Postop Assessment: no apparent nausea or vomiting Anesthetic complications: no  No notable events documented.  Last Vitals:  Vitals:   07/23/22 1715 07/23/22 1730  BP: 113/83 116/83  Pulse: 66 73  Resp: 17 12  Temp:  36.7 C  SpO2: 95% 92%    Last Pain:  Vitals:   07/23/22 1700  TempSrc:   PainSc: 8                  Tiesha Marich

## 2022-07-28 ENCOUNTER — Other Ambulatory Visit (HOSPITAL_COMMUNITY): Payer: Self-pay

## 2022-07-30 DIAGNOSIS — G4733 Obstructive sleep apnea (adult) (pediatric): Secondary | ICD-10-CM | POA: Diagnosis not present

## 2022-08-05 ENCOUNTER — Other Ambulatory Visit (HOSPITAL_COMMUNITY): Payer: Self-pay | Admitting: Cardiology

## 2022-08-05 DIAGNOSIS — M25521 Pain in right elbow: Secondary | ICD-10-CM | POA: Diagnosis not present

## 2022-08-19 ENCOUNTER — Other Ambulatory Visit: Payer: Self-pay

## 2022-08-19 ENCOUNTER — Other Ambulatory Visit (HOSPITAL_COMMUNITY): Payer: Self-pay

## 2022-09-13 ENCOUNTER — Other Ambulatory Visit (HOSPITAL_COMMUNITY): Payer: Self-pay

## 2022-09-13 ENCOUNTER — Other Ambulatory Visit: Payer: Self-pay

## 2022-09-13 ENCOUNTER — Telehealth: Payer: Self-pay

## 2022-09-13 ENCOUNTER — Other Ambulatory Visit (HOSPITAL_COMMUNITY): Payer: Self-pay | Admitting: Cardiology

## 2022-09-13 ENCOUNTER — Encounter (HOSPITAL_COMMUNITY): Payer: Self-pay

## 2022-09-13 DIAGNOSIS — M25521 Pain in right elbow: Secondary | ICD-10-CM | POA: Diagnosis not present

## 2022-09-13 DIAGNOSIS — Z4789 Encounter for other orthopedic aftercare: Secondary | ICD-10-CM | POA: Diagnosis not present

## 2022-09-13 MED ORDER — ENTRESTO 49-51 MG PO TABS
1.0000 | ORAL_TABLET | Freq: Two times a day (BID) | ORAL | 3 refills | Status: DC
  Filled 2022-09-13 – 2022-10-20 (×2): qty 180, 90d supply, fill #0
  Filled 2023-01-14: qty 180, 90d supply, fill #1
  Filled 2023-04-13: qty 180, 90d supply, fill #2
  Filled 2023-07-12: qty 180, 90d supply, fill #3

## 2022-09-13 NOTE — Telephone Encounter (Addendum)
Device alert for successful ATP therapy Events occurred 1/26 13:43-13:44, EGM's show AF with RVR, rates 218 falling into the VT-2 zone.  First two events with ATP x1 Third events shows AF with RVR, after first ATP appears to deteriorate to VT, second ATP rhythm returns to AF, HV therapy aborted Known AF, Eliquis Presenting tachy 140's - route to triage [place] with patient, he states that he has been drinking high energy drinks daily. Ran out of his medications for several days and has been under a lot of stress, having panic attacks.  He has gotten back on his medications and is working to better manage his stress.  We discussed the importance of eliminating the drinks and getting back on his med regimen.  No symptoms at present.  Patient verbalizes understanding.  Reviewed with Dr. Lovena Le - patient needs to get back on his medications and off the caffeine drinks.  He should keep his regular follow up with the clinic, no other changes at this point.  We will continue to monitor. Patient made aware and verbalizes understanding.   Received updated transmission to better evaluate patient's current rhythm: (09/13/22)  09/13/22 PRESENTING RHYTHM: (Rate: avg 70's bpm)           JANUARY 27TH TRANSMISSION:

## 2022-09-13 NOTE — Telephone Encounter (Signed)
Opened in error

## 2022-09-14 ENCOUNTER — Other Ambulatory Visit (HOSPITAL_COMMUNITY): Payer: Self-pay

## 2022-09-15 ENCOUNTER — Telehealth (HOSPITAL_COMMUNITY): Payer: Self-pay

## 2022-09-15 NOTE — Telephone Encounter (Signed)
Advanced Heart Failure Patient Advocate Encounter  Medication Samples have been left at registration desk for patient pick up. Drug name: Eliquis 5MG  Qty: 4x 14 ct packages LOT: [id number] Exp.: 04/2024 SIG: Take 1 tablet by mouth twice daily   The patient has been instructed regarding the correct time, dose, and frequency of taking this medication, including desired effects and most common side effects.   Clista Bernhardt, CPhT Rx Patient Advocate Phone: [phone]

## 2022-09-16 ENCOUNTER — Ambulatory Visit (INDEPENDENT_AMBULATORY_CARE_PROVIDER_SITE_OTHER): Payer: BC Managed Care – PPO

## 2022-09-16 DIAGNOSIS — I480 Paroxysmal atrial fibrillation: Secondary | ICD-10-CM

## 2022-09-16 LAB — CUP PACEART REMOTE DEVICE CHECK
Battery Remaining Longevity: 93 mo
Battery Remaining Percentage: 76 %
Battery Voltage: 2.98 V
Brady Statistic RV Percent Paced: 1 %
Date Time Interrogation Session: 20240201010017
HighPow Impedance: 79 Ohm
Implantable Lead Connection Status: 753985
Implantable Lead Implant Date: 20210805
Implantable Lead Location: 753860
Implantable Pulse Generator Implant Date: 20210805
Lead Channel Impedance Value: 410 Ohm
Lead Channel Pacing Threshold Amplitude: 1 V
Lead Channel Pacing Threshold Pulse Width: 0.5 ms
Lead Channel Sensing Intrinsic Amplitude: 11.7 mV
Lead Channel Setting Pacing Amplitude: 2.5 V
Lead Channel Setting Pacing Pulse Width: 0.5 ms
Lead Channel Setting Sensing Sensitivity: 0.5 mV
Pulse Gen Serial Number: 111025604
Zone Setting Status: 755011

## 2022-09-22 ENCOUNTER — Other Ambulatory Visit: Payer: Self-pay | Admitting: Internal Medicine

## 2022-09-23 ENCOUNTER — Encounter (HOSPITAL_COMMUNITY): Payer: Self-pay | Admitting: *Deleted

## 2022-09-23 NOTE — Telephone Encounter (Signed)
This is a CHF pt 

## 2022-09-27 DIAGNOSIS — R2231 Localized swelling, mass and lump, right upper limb: Secondary | ICD-10-CM | POA: Diagnosis not present

## 2022-10-07 NOTE — Progress Notes (Signed)
Remote ICD transmission.   

## 2022-10-19 ENCOUNTER — Ambulatory Visit: Payer: Medicare Other | Admitting: Internal Medicine

## 2022-10-20 ENCOUNTER — Other Ambulatory Visit (HOSPITAL_COMMUNITY): Payer: Self-pay | Admitting: Cardiology

## 2022-10-20 ENCOUNTER — Other Ambulatory Visit: Payer: Self-pay

## 2022-10-20 ENCOUNTER — Other Ambulatory Visit (HOSPITAL_COMMUNITY): Payer: Self-pay

## 2022-10-20 MED ORDER — APIXABAN 5 MG PO TABS
5.0000 mg | ORAL_TABLET | Freq: Two times a day (BID) | ORAL | 3 refills | Status: DC
  Filled 2022-10-20: qty 60, 30d supply, fill #0
  Filled 2022-11-16: qty 60, 30d supply, fill #1
  Filled 2022-12-14: qty 60, 30d supply, fill #2
  Filled 2023-01-14: qty 60, 30d supply, fill #3
  Filled 2023-02-15: qty 60, 30d supply, fill #4
  Filled 2023-03-14: qty 60, 30d supply, fill #5
  Filled 2023-04-13: qty 60, 30d supply, fill #6
  Filled 2023-05-13: qty 60, 30d supply, fill #7
  Filled 2023-06-08: qty 60, 30d supply, fill #8
  Filled 2023-07-12: qty 60, 30d supply, fill #9
  Filled 2023-08-09: qty 60, 30d supply, fill #10
  Filled 2023-09-05 – 2023-09-30 (×4): qty 60, 30d supply, fill #11

## 2022-10-21 ENCOUNTER — Other Ambulatory Visit: Payer: Self-pay

## 2022-11-04 ENCOUNTER — Telehealth: Payer: Self-pay

## 2022-11-04 NOTE — Telephone Encounter (Addendum)
Alert received from [employer]:  Device alert for successful ATP therapy Numerous events labeled NSVT 3/14 between 18:43-20:40, EGM show irregular R-R, pt has known AF, Eliuqis Event labeled VF occurred 3/14 @ 19:57, irregular R-R, rate 240, ATP delivered x1 while charging, ATP slowed rhythm  Outreach made to Pt.  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 He advises he has not been consistent with taking his Toprol XL and quite likely missed doses around the time of the events. Attempted to schedule a sooner appt with Dr. Lovena Le, but he declines.  He states he and his family are very busy right now and he needs to go on a vacation. Will try to schedule with GT in April.  He requests I call him back tomorrow.

## 2022-11-08 NOTE — Telephone Encounter (Signed)
Left message requesting call back.  Needs sooner f/u with GT.

## 2022-11-16 ENCOUNTER — Other Ambulatory Visit (HOSPITAL_COMMUNITY): Payer: Self-pay

## 2022-11-16 ENCOUNTER — Other Ambulatory Visit: Payer: Self-pay

## 2022-11-16 ENCOUNTER — Other Ambulatory Visit: Payer: Self-pay | Admitting: Cardiology

## 2022-11-16 MED ORDER — METOPROLOL SUCCINATE ER 100 MG PO TB24
ORAL_TABLET | ORAL | 2 refills | Status: DC
  Filled 2023-04-28: qty 135, 90d supply, fill #0
  Filled 2023-07-21: qty 135, 90d supply, fill #1
  Filled 2023-10-21: qty 135, 90d supply, fill #2

## 2022-11-22 ENCOUNTER — Telehealth: Payer: Self-pay

## 2022-11-22 NOTE — Telephone Encounter (Signed)
Following alert received from [employer] received for  Device alert for ATP therapy Numerous events labeled NSVT 3/29 between 00:11-01:40, EGM show both regular and  irregular R-R, pt has known AF, Eliuqis Event labeled VT-2 occurred 4/6 @ 21:18 - 21:21, bursts of ATP delivered 1-3 times, EGM's show VT vs AF with RVR Route to triage per protocol, pt has f/u 4/10 per EPIC.  Attempted to contact pt to assess. No answer. LMTCB.

## 2022-11-23 NOTE — Telephone Encounter (Signed)
Unable to reach Pt.  Outreach made to Pt's wife.  Requested Pt come to office visit tomorrow with GT.  Pt scheduled for 11/24/22 at 3:15 pm.

## 2022-11-24 ENCOUNTER — Ambulatory Visit: Payer: Medicare Other | Admitting: Internal Medicine

## 2022-11-25 ENCOUNTER — Encounter: Payer: Medicare Other | Admitting: Internal Medicine

## 2022-12-09 DIAGNOSIS — G4733 Obstructive sleep apnea (adult) (pediatric): Secondary | ICD-10-CM | POA: Diagnosis not present

## 2022-12-13 ENCOUNTER — Telehealth: Payer: Self-pay

## 2022-12-13 NOTE — Telephone Encounter (Signed)
The patient states he was on a call with another person. I told him the nurse will give him a call back.

## 2022-12-13 NOTE — Telephone Encounter (Signed)
LMOVM for patient (okay per DPR). Holding appointment with Dr. Ladona Ridgel for 12/20/22 at 9am.  Patient to call back and confirm if he can keep it.  Patient is past due and needs follow up especially given recent alerts.  still have not received updated manual transmission from patient yet.  Will continue to monitor.

## 2022-12-13 NOTE — Telephone Encounter (Signed)
Following alert received from [employer] received for Device alert for VT in the monitor zone, event occurred 4/29 @ 04:26, EGM with some R-R irregularity, HR 179, duration 36sec, VT vs AF with RVR, Eliquis 32 additional NSVT and 1 SVT, likely AF with RVR Pt was to have f/u per EPIC - route to triage.  Attempted to call patient. No answer. Also attempted spouse on file, states I need to call his cell phone. Will attempt for retry.

## 2022-12-13 NOTE — Telephone Encounter (Signed)
Patient reports of taking an OTC medication for weight loss, unable to recall name of pills. Also reports he has not taken his medications in 3 days due to being out of them. States he has them now and took first dose this am. Reports he has been under a lot of stress including several deaths in his family. Denies any symptoms. Advised to call if any symptoms arise. Voiced understanding.

## 2022-12-13 NOTE — Telephone Encounter (Signed)
Please have pt send manual transmission

## 2022-12-13 NOTE — Telephone Encounter (Signed)
Waiting on manual transmission

## 2022-12-14 ENCOUNTER — Other Ambulatory Visit (HOSPITAL_COMMUNITY): Payer: Self-pay

## 2022-12-15 NOTE — Telephone Encounter (Signed)
Attempted to return call. Phone went straight to VM.   PLEASE HAVE PATIENT SEND REMOTE TRANSMISSION WHEN HE OR WIFE RETURNS CALL!!!

## 2022-12-15 NOTE — Telephone Encounter (Signed)
Pt called in to sch.. he states Dr. Ladona Ridgel wants to see him personally not APP and he can only do the last week of may. He states if anything to available to sch and LVM or message through [employer]

## 2022-12-15 NOTE — Telephone Encounter (Signed)
Attempted to contact patient for f/u. No answer, LMTCB.

## 2022-12-15 NOTE — Telephone Encounter (Signed)
Attempted all numbers on file to contact patient. No answer, LMTCB on all VM's.  Mychart message sent to pt.

## 2022-12-16 ENCOUNTER — Ambulatory Visit (INDEPENDENT_AMBULATORY_CARE_PROVIDER_SITE_OTHER): Payer: Medicare Other

## 2022-12-16 DIAGNOSIS — I5021 Acute systolic (congestive) heart failure: Secondary | ICD-10-CM | POA: Diagnosis not present

## 2022-12-16 LAB — CUP PACEART REMOTE DEVICE CHECK
Battery Remaining Longevity: 90 mo
Battery Remaining Percentage: 74 %
Battery Voltage: 2.98 V
Brady Statistic RV Percent Paced: 1 %
Date Time Interrogation Session: 20240502030137
HighPow Impedance: 79 Ohm
Implantable Lead Connection Status: 753985
Implantable Lead Implant Date: 20210805
Implantable Lead Location: 753860
Implantable Pulse Generator Implant Date: 20210805
Lead Channel Impedance Value: 390 Ohm
Lead Channel Pacing Threshold Amplitude: 1 V
Lead Channel Pacing Threshold Pulse Width: 0.5 ms
Lead Channel Sensing Intrinsic Amplitude: 11.7 mV
Lead Channel Setting Pacing Amplitude: 2.5 V
Lead Channel Setting Pacing Pulse Width: 0.5 ms
Lead Channel Setting Sensing Sensitivity: 0.5 mV
Pulse Gen Serial Number: 111025604
Zone Setting Status: 755011

## 2022-12-16 NOTE — Telephone Encounter (Signed)
Transmission Reviewed today.  No further flagged VT episodes; however, resting rate at 3am presents as fast at 125bpm.  Patient is agitated with [place] on the phone today, stating he is back on his medication, under a lot of stress and "knows why his rates are elevated right now".   States he feels fine other than the stress he is under.  Refused appointment with Dr. Ladona Ridgel this Monday and won't come in before 5/30.  He has had several deaths in his family and is dealing with many issues right now.    I gave him ER precautions and told him we will continue to monitor him remotely and will have to call him if any further concerning episodes as that is our job.    He apologizes for his behavior and thanks me for our ongoing support.

## 2022-12-20 ENCOUNTER — Encounter: Payer: Medicare Other | Admitting: Internal Medicine

## 2023-01-05 NOTE — Progress Notes (Signed)
Remote ICD transmission.   

## 2023-01-13 ENCOUNTER — Ambulatory Visit: Payer: Medicare Other | Admitting: Internal Medicine

## 2023-01-14 ENCOUNTER — Other Ambulatory Visit (HOSPITAL_COMMUNITY): Payer: Self-pay

## 2023-01-14 ENCOUNTER — Other Ambulatory Visit: Payer: Self-pay

## 2023-01-19 ENCOUNTER — Telehealth: Payer: Self-pay

## 2023-01-19 ENCOUNTER — Telehealth: Payer: Self-pay | Admitting: Internal Medicine

## 2023-01-19 NOTE — Telephone Encounter (Signed)
Patient called back highly upset r/t driving restrictions; threatening to throw out his meds and cancel appointment. Educated pt regarding importance of adhering to medication regimen, shock plan, and driving restrictions. Asymptomatic during alerted episodes. End of call patient wanted to be held accountable to him saying "From this day forward I will be anal about taking my medications and taking this all more serious." Pt aware of upcoming appointment and agrees to come.

## 2023-01-19 NOTE — Telephone Encounter (Signed)
Patient is returning RN Casey's call. He stated he was in a hurry due to another long distance call from [place] being on the line, so he did not want me to attempt transferring to the device clinic.   He requested I just advise he is aware of the alert and is fine. He reports it was caused by drinking energy drinks, missing his meds for a few days, and not sleeping for a few days due to gabbling with friends.   He states a callback is not necessary and he will be at his appt in July with Dr. Ladona Ridgel.

## 2023-01-19 NOTE — Telephone Encounter (Signed)
Following alert received from [employer] received for    Device alert for successful ATP therapy Since last transmission there have been 27 NSVT with irregular R-R and 3 SVT showing the same There were 2 VT-2 events 6/4 @ 01:31 & 01:32 showing irregular R-R and also segments of regularity, ATP delivered x1 with both events slowing rhythm Hx of AF, Eliquis per PA report, some increase in daily HR's per trends Route to triage [place], [facility]  Attempted to contact pt. No answer, LMTCB.

## 2023-01-19 NOTE — Telephone Encounter (Signed)
6/5 Attempted to call patient second time. Left voicemail with direct number to device clinic. Also, informed patient of driving restrictions x6 months as well as I have other topics I need to cover with him and it is important we speak; ie.- shock plan and the importance of medication regimen adherence among other things.   CJL

## 2023-01-24 ENCOUNTER — Telehealth: Payer: Self-pay | Admitting: Internal Medicine

## 2023-01-24 NOTE — Telephone Encounter (Signed)
Patient wanted to inform Baird Lyons that Saturday he missed all his medications. He states he traveled to [place], [place] Saturday to help his friend move. He helped unload the truck and when he went to take his medications he realized that he took the wrong case of medications and he didn't have any of his normal medications. He states he got back last night and he is back on track with taking his medications. He just wanted to inform [facility] is concerned or alerted of any abnormalities.

## 2023-01-24 NOTE — Telephone Encounter (Signed)
Called patient who denies any symptoms while out of town. States he is back on track. Patient slept beside his monitor last night and did not force anything into [employer]. Advised patient to try to stay compliant and set reminders on phone. Patient voiced understanding and agreeable to plan.

## 2023-01-31 ENCOUNTER — Other Ambulatory Visit (HOSPITAL_COMMUNITY): Payer: Self-pay

## 2023-02-01 NOTE — Telephone Encounter (Signed)
I think probably atrial fib. No need for driving restriction.

## 2023-02-04 NOTE — Telephone Encounter (Signed)
LVM for patient to call device clinic back  inform him that Dr. Ladona Ridgel reviewed the episodes and was likely atrial fibrillation with RVR and thus no need for driving restrictions

## 2023-02-07 ENCOUNTER — Telehealth: Payer: Self-pay | Admitting: Internal Medicine

## 2023-02-07 NOTE — Telephone Encounter (Signed)
Pt called back states he got a call 02/04/2023 from [place] and he wants to know what the call was for. I let pt know a nurse will call him back . Pt is really frustrated because he doesn't know what it is about. Pt spoke with Leigh 01/24/2023 and that was it, he doesn't know why someone would be calling him back

## 2023-02-07 NOTE — Telephone Encounter (Signed)
I spoke with the patient and he been taking his meds with the help of his brother. His wife fell and broke both of her ankles. He feels helpless because she does everything for him. He states he been feeling well.   I told him I think Lanora Manis called to follow up about the call he received on June 10th. I told him we have not gotten anything new. I told him to stay hydrated and take his meds as prescribed. I told him even if he do not get a call back today I will relay the message to the nurse.

## 2023-02-07 NOTE — Telephone Encounter (Signed)
Noted, I do not see any further communication outreach to patient other than 6/5 and 6/10 communications which were addressed.  There have been no further alerts or transmissions to note.

## 2023-02-07 NOTE — Telephone Encounter (Signed)
Follow Up:        Patient says he needs to tell someone what is going on with him

## 2023-02-07 NOTE — Telephone Encounter (Signed)
Follow Up:     Patient says he is returning Richard Graham's call from Friday.

## 2023-02-08 ENCOUNTER — Telehealth: Payer: Self-pay

## 2023-02-08 NOTE — Telephone Encounter (Signed)
Spoke with patient. Informed of no need to continue with driving restrictions. Pt stressed about his medications and self care after his wife had to have surgery to repair BLE fractures repaired. Noted distress about organizing medications and taking appropriately. He said his friend will be coming by to check behind him. Explained if he needs to to go to the pharmacy and see if they can help. Also, reinforced that if he is having trouble that is causing him to miss doses to call and we will figure something out to get him what he needs. No further follow up needed at this time.

## 2023-02-15 ENCOUNTER — Other Ambulatory Visit: Payer: Self-pay

## 2023-02-15 ENCOUNTER — Other Ambulatory Visit (HOSPITAL_COMMUNITY): Payer: Self-pay

## 2023-02-18 ENCOUNTER — Other Ambulatory Visit (HOSPITAL_COMMUNITY): Payer: Self-pay

## 2023-02-28 ENCOUNTER — Encounter: Payer: Self-pay | Admitting: Internal Medicine

## 2023-02-28 ENCOUNTER — Ambulatory Visit: Payer: Medicare Other | Attending: Internal Medicine | Admitting: Internal Medicine

## 2023-02-28 VITALS — HR 74 | Ht 73.0 in | Wt 273.0 lb

## 2023-02-28 DIAGNOSIS — I10 Essential (primary) hypertension: Secondary | ICD-10-CM

## 2023-02-28 NOTE — Patient Instructions (Signed)
Medication Instructions:  No changes *If you need a refill on your cardiac medications before your next appointment, please call your pharmacy*   Lab Work: none If you have labs (blood work) drawn today and your tests are completely normal, you will receive your results only by: MyChart Message (if you have MyChart) OR A paper copy in the mail If you have any lab test that is abnormal or we need to change your treatment, we will call you to review the results.   Testing/Procedures: none   Follow-Up: At [facility], you and your health needs are our priority.  As part of our continuing mission to provide you with exceptional heart care, we have created designated Provider Care Teams.  These Care Teams include your primary Cardiologist (physician) and Advanced Practice Providers (APPs -  Physician Assistants and Nurse Practitioners) who all work together to provide you with the care you need, when you need it.    Your next appointment:   12 month(s)  Provider:   [name], MD

## 2023-02-28 NOTE — Progress Notes (Signed)
HPI Richard Graham returns today for followup. He is a pleasant 67 yo man with PAF on amiodarone, an ICM, s/p MI, RBBB, chronic systolic heart failure, s/p ICD insertion. He has had his dose of amiodarone reduced to 100 mg daily. His BMI is increased. He denies angina and he has not had any ICD therapies. No chest pain. he admits to drinking extra fluid. His sodium has been low. He admits to dietary indiscretion and has gained  Allergies  Allergen Reactions   Influenza Virus Vaccine Other (See Comments)    Got really Sick     Current Outpatient Medications  Medication Sig Dispense Refill   amiodarone (PACERONE) 200 MG tablet TAKE 1/2 TABLET BY MOUTH MONDAY THRU FRIDAY. TAKE A WHOLE TABLET BY MOUTH ON SATURDAY AND SUNDAY 60 tablet 3   apixaban (ELIQUIS) 5 MG TABS tablet Take 1 tablet (5 mg total) by mouth 2 (two) times daily. 180 tablet 3   Ascorbic Acid (VITAMIN C) 1000 MG tablet Take 1,000 mg by mouth daily. Vitafusion     colchicine 0.6 MG tablet Take 0.6 mg by mouth 2 (two) times daily as needed (for gout flares).      Cyanocobalamin (VITAMIN B-12 PO) Take 1 tablet by mouth daily. Vitafusion     diclofenac sodium (VOLTAREN) 1 % GEL Apply 2 g topically 2 (two) times daily as needed (to affected sites- for arthritis pain).      empagliflozin (JARDIANCE) 10 MG TABS tablet Take 1 tablet (10 mg total) by mouth daily. 30 tablet 11   furosemide (LASIX) 20 MG tablet TAKE 3 TABLETS(60 MG) BY MOUTH DAILY (Patient taking differently: Take 20-40 mg by mouth See admin instructions. Take 40 mg in the morning and 20 mg in the afternoon) 270 tablet 3   metoprolol succinate (TOPROL-XL) 100 MG 24 hr tablet Take 1 tablet (100 mg total) by mouth in the morning AND 0.5 tablets (50 mg total) every evening. Take with or immediately following a meal.. 180 tablet 2   Multiple Vitamins-Minerals (MULTIVITAMIN WITH MINERALS) tablet Take 1 tablet by mouth daily. Vitafusion     Multiple Vitamins-Minerals (ZINC  PO) Take 1 tablet by mouth daily.     oxyCODONE-acetaminophen (PERCOCET/ROXICET) 5-325 MG tablet Take 1 tablet by mouth 4 (four) times daily.     Pyridoxine HCl (B-6 PO) Take 1 tablet by mouth daily.     rosuvastatin (CRESTOR) 5 MG tablet TAKE 1 TABLET(5 MG) BY MOUTH DAILY 90 tablet 3   sacubitril-valsartan (ENTRESTO) 49-51 MG Take 1 tablet by mouth 2 (two) times daily. 180 tablet 3   spironolactone (ALDACTONE) 25 MG tablet TAKE 1 TABLET(25 MG) BY MOUTH DAILY 90 tablet 3   tadalafil (CIALIS) 20 MG tablet Take 20 mg by mouth daily as needed for erectile dysfunction.     sertraline (ZOLOFT) 25 MG tablet Take 1 tablet (25 mg total) by mouth daily. (Patient not taking: Reported on 06/09/2022) 30 tablet 3   No current facility-administered medications for this visit.     Past Medical History:  Diagnosis Date   AICD (automatic cardioverter/defibrillator) present    CAD (coronary artery disease), native coronary artery    chronically occluded mid LAD with left to left collaterals.    Cerebral aneurysm    s/p embolization   Chronic systolic (congestive) heart failure (HCC)    biventricular HF with moderate RV dysfunction and severe LV dysfunction by Cardiac MRI with EF 22% no viability in the apical septal/apical/inferior,mid anterior and  true apex.     Diabetes mellitus without complication (HCC)    on Jardiance   Gout    HLD (hyperlipidemia)    PAF (paroxysmal atrial fibrillation) (HCC)    s/p DCCV 12/2019   Pre-diabetes    diet controlled, no meds   Presence of permanent cardiac pacemaker    Gallant/Abbott Pacemaker, last remote check 10/19/21   Sleep apnea    use cpap nightly    ROS:   All systems reviewed and negative except as noted in the HPI.   Past Surgical History:  Procedure Laterality Date   CARDIOVERSION N/A 12/28/2019   Procedure: CARDIOVERSION;  Surgeon: Chilton Si, MD;  Location: [facility] ENDOSCOPY;  Service: Cardiovascular;  Laterality: N/A;   CARPAL TUNNEL RELEASE  Left 04/20/2022   Procedure: Left carpal tunnel release. Left ulnar nerve release.;  Surgeon: Dominica Severin, MD;  Location: [facility] OR;  Service: Orthopedics;  Laterality: Left;  90 mins   CARPAL TUNNEL WITH CUBITAL TUNNEL Right 07/23/2022   Procedure: RIGHT CARPAL TUNNEL RELEASE;  Surgeon: Dominica Severin, MD;  Location: [facility] OR;  Service: Orthopedics;  Laterality: Right;  2 hrs   COLONOSCOPY     EXCISION MASS UPPER EXTREMETIES Right 07/23/2022   Procedure: RIGHT ELBOW MASS EXCISION WITH BURSECTOMY AND RIGHT ULNAR NERVE RELEASE AT THE ELBOW/CUBITAL TUNNEL;  Surgeon: Dominica Severin, MD;  Location: [facility] OR;  Service: Orthopedics;  Laterality: Right;   ICD IMPLANT N/A 03/20/2020   Procedure: ICD IMPLANT;  Surgeon: Marinus Maw, MD;  Location: [facility] INVASIVE CV LAB;  Service: Cardiovascular;  Laterality: N/A;   INSERTION OF MESH N/A 06/27/2015   Procedure: INSERTION OF MESH;  Surgeon: Axel Filler, MD;  Location: [facility] ORS;  Service: General;  Laterality: N/A;   IR 3D INDEPENDENT WKST  11/14/2019   IR 3D INDEPENDENT WKST  11/22/2019   IR ANGIO INTRA EXTRACRAN SEL COM CAROTID INNOMINATE BILAT MOD SED  11/14/2019   IR ANGIO INTRA EXTRACRAN SEL INTERNAL CAROTID UNI R MOD SED  11/22/2019   IR ANGIO VERTEBRAL SEL VERTEBRAL BILAT MOD SED  11/14/2019   IR ANGIOGRAM FOLLOW UP STUDY  11/22/2019   IR CT HEAD LTD  11/22/2019   IR NEURO EACH ADD'L AFTER BASIC UNI RIGHT (MS)  11/22/2019   IR TRANSCATH/EMBOLIZ  11/22/2019   laceration to fingers     left hand   MASS EXCISION Left 04/20/2022   Procedure: Left elbow mass removal and bursectomy;  Surgeon: Dominica Severin, MD;  Location: [facility] OR;  Service: Orthopedics;  Laterality: Left;   MOUTH SURGERY     secondary to abscess    RADIOLOGY WITH ANESTHESIA N/A 11/22/2019   Procedure: EMBOLIZATION;  Surgeon: Julieanne Cotton, MD;  Location: [facility] OR;  Service: Radiology;  Laterality: N/A;   RIGHT/LEFT HEART CATH AND CORONARY ANGIOGRAPHY N/A 11/12/2019   Procedure:  RIGHT/LEFT HEART CATH AND CORONARY ANGIOGRAPHY;  Surgeon: Corky Crafts, MD;  Location: [facility] INVASIVE CV LAB;  Service: Cardiovascular;  Laterality: N/A;   TONSILLECTOMY     removed as a child   trauma to lip      sutured   VENTRAL HERNIA REPAIR N/A 06/27/2015   Procedure: LAPAROSCOPIC VENTRAL HERNIA;  Surgeon: Axel Filler, MD;  Location: [facility] ORS;  Service: General;  Laterality: N/A;     No family history on file.   Social History   Socioeconomic History   Marital status: Married    Spouse name: Not on file   Number of children: 0   Years of education:  Not on file   Highest education level: Some college, no degree  Occupational History   Not on file  Tobacco Use   Smoking status: Former    Current packs/day: 0.00    Average packs/day: 0.3 packs/day for 3.0 years (0.8 ttl pk-yrs)    Types: Cigarettes    Start date: 08/16/2008    Quit date: 08/17/2011    Years since quitting: 11.5   Smokeless tobacco: Never  Vaping Use   Vaping status: Never Used  Substance and Sexual Activity   Alcohol use: Yes    Alcohol/week: 2.0 standard drinks of alcohol    Types: 2 Glasses of wine per week   Drug use: No   Sexual activity: Yes  Other Topics Concern   Not on file  Social History Narrative   Not on file   Social Determinants of Health   Financial Resource Strain: Not on file  Food Insecurity: Not on file  Transportation Needs: Not on file  Physical Activity: Not on file  Stress: Not on file  Social Connections: Not on file  Intimate Partner Violence: Not on file     Pulse 74   Ht 6\' 1"  (1.854 m)   Wt 273 lb (123.8 kg)   SpO2 95%   BMI 36.02 kg/m   Physical Exam:  Well appearing NAD HEENT: Unremarkable Neck:  No JVD, no thyromegally Lymphatics:  No adenopathy Back:  No CVA tenderness Lungs:  Clear with no wheezes HEART:  Regular rate rhythm, no murmurs, no rubs, no clicks Abd:  soft, positive bowel sounds, no organomegally, no rebound, no guarding Ext:  2  plus pulses, no edema, no cyanosis, no clubbing Skin:  No rashes no nodules Neuro:  CN II through XII intact, motor grossly intact  EKG - nsr with RBBB  DEVICE  Normal device function.  See PaceArt for details.   Assess/Plan: Palpitations - I have encouraged the patient to reduce his caffeine intake. Continue beta blocker Obesity - he is encouraged to avoid high fat foods. Coags - he will continue eliquis. Dyslipidemia - he will continue crestor.  Sharlot Gowda [place] Carmack,MD

## 2023-03-01 LAB — CUP PACEART INCLINIC DEVICE CHECK
Battery Remaining Longevity: 90 mo
Brady Statistic RV Percent Paced: 0 %
Date Time Interrogation Session: 20240715142700
HighPow Impedance: 72 Ohm
Implantable Lead Connection Status: 753985
Implantable Lead Implant Date: 20210805
Implantable Lead Location: 753860
Implantable Pulse Generator Implant Date: 20210805
Lead Channel Impedance Value: 412.5 Ohm
Lead Channel Pacing Threshold Amplitude: 0.5 V
Lead Channel Pacing Threshold Amplitude: 0.5 V
Lead Channel Pacing Threshold Amplitude: 0.75 V
Lead Channel Pacing Threshold Pulse Width: 0.5 ms
Lead Channel Pacing Threshold Pulse Width: 0.5 ms
Lead Channel Pacing Threshold Pulse Width: 0.5 ms
Lead Channel Sensing Intrinsic Amplitude: 11.7 mV
Lead Channel Setting Pacing Amplitude: 2.5 V
Lead Channel Setting Pacing Pulse Width: 0.5 ms
Lead Channel Setting Sensing Sensitivity: 0.5 mV
Pulse Gen Serial Number: 111025604
Zone Setting Status: 755011

## 2023-03-03 ENCOUNTER — Other Ambulatory Visit: Payer: Self-pay

## 2023-03-03 ENCOUNTER — Telehealth: Payer: Self-pay

## 2023-03-03 ENCOUNTER — Other Ambulatory Visit (HOSPITAL_COMMUNITY): Payer: Self-pay

## 2023-03-03 NOTE — Telephone Encounter (Addendum)
Alert remote transmission: High ventricular rates There were 32 NSVT events and 12 SVT events.  There was one VT monitor only episode that was 2 minute  and 2 seconds.  These may be supraventricular in origin but can not rule out NSVT.  Sent to triage. Follow up as scheduled.  Hassell Halim, RN, CCDS, [employer]   LMTCB to assess for symptoms.

## 2023-03-03 NOTE — Telephone Encounter (Signed)
Central fill pharmacy at [facility] will transfer Rx from [facility] to their pharmacy. They will pill pack whatever is due to be filled. They already have a few Rx that were filled with [facility] pharmacy. Richard Graham is due to be filled. In the mean time, I will assist patient in filling his medbox for the medications that could not be pill packed yet. Patient may come in to office 7/23 @ 11:00 AM. He will need to bring all the medications he is taking and his medbox.

## 2023-03-04 NOTE — Telephone Encounter (Signed)
Routing to casey for f/u on symptoms and apt. With pharmacy.

## 2023-03-04 NOTE — Telephone Encounter (Signed)
Pt reported he was celebrating his birthday at the time of the events and had an energy drink and a few drinks. Asymptomatic. Reports not missing any medications.

## 2023-03-04 NOTE — Telephone Encounter (Signed)
Pt is very happy to come 11 on Tuesday.

## 2023-03-05 ENCOUNTER — Other Ambulatory Visit (HOSPITAL_COMMUNITY): Payer: Self-pay

## 2023-03-08 ENCOUNTER — Other Ambulatory Visit: Payer: Self-pay

## 2023-03-08 ENCOUNTER — Telehealth: Payer: Self-pay | Admitting: Pharmacist

## 2023-03-08 ENCOUNTER — Other Ambulatory Visit (HOSPITAL_COMMUNITY): Payer: Self-pay

## 2023-03-08 MED ORDER — CEPHALEXIN 500 MG PO CAPS
500.0000 mg | ORAL_CAPSULE | Freq: Four times a day (QID) | ORAL | 0 refills | Status: DC
  Filled 2023-03-19: qty 28, 7d supply, fill #0

## 2023-03-08 MED ORDER — ONDANSETRON HCL 8 MG PO TABS: 8.0000 mg | ORAL_TABLET | Freq: Three times a day (TID) | ORAL | 0 refills | Status: DC

## 2023-03-08 MED FILL — Rosuvastatin Calcium Tab 5 MG: ORAL | 90 days supply | Qty: 90 | Fill #0 | Status: CN

## 2023-03-08 NOTE — Telephone Encounter (Signed)
Assisted patient filling his medication boxes for the next 2 weeks.

## 2023-03-14 ENCOUNTER — Other Ambulatory Visit (HOSPITAL_COMMUNITY): Payer: Self-pay

## 2023-03-14 ENCOUNTER — Other Ambulatory Visit: Payer: Self-pay

## 2023-03-14 MED FILL — Rosuvastatin Calcium Tab 5 MG: ORAL | 90 days supply | Qty: 90 | Fill #0 | Status: AC

## 2023-03-16 DIAGNOSIS — G4733 Obstructive sleep apnea (adult) (pediatric): Secondary | ICD-10-CM | POA: Diagnosis not present

## 2023-03-17 ENCOUNTER — Ambulatory Visit (INDEPENDENT_AMBULATORY_CARE_PROVIDER_SITE_OTHER): Payer: Medicare Other

## 2023-03-17 DIAGNOSIS — I5022 Chronic systolic (congestive) heart failure: Secondary | ICD-10-CM

## 2023-03-17 LAB — CUP PACEART REMOTE DEVICE CHECK
Battery Remaining Longevity: 86 mo
Battery Remaining Percentage: 71 %
Battery Voltage: 2.96 V
Brady Statistic RV Percent Paced: 1 %
Date Time Interrogation Session: 20240801080138
HighPow Impedance: 72 Ohm
Implantable Lead Connection Status: 753985
Implantable Lead Implant Date: 20210805
Implantable Lead Location: 753860
Implantable Pulse Generator Implant Date: 20210805
Lead Channel Impedance Value: 390 Ohm
Lead Channel Pacing Threshold Amplitude: 0.5 V
Lead Channel Pacing Threshold Pulse Width: 0.5 ms
Lead Channel Sensing Intrinsic Amplitude: 11.7 mV
Lead Channel Setting Pacing Amplitude: 2.5 V
Lead Channel Setting Pacing Pulse Width: 0.5 ms
Lead Channel Setting Sensing Sensitivity: 0.5 mV
Pulse Gen Serial Number: 111025604
Zone Setting Status: 755011

## 2023-03-18 ENCOUNTER — Telehealth (HOSPITAL_COMMUNITY): Payer: Self-pay

## 2023-03-18 NOTE — Telephone Encounter (Signed)
Patient called in stating that he couldn't access his device reports on his MyChart- patient wanted to ensure that we could access it. Advised patient that we could. Asked patient if he would like me to send a message over to device clinic for them to review- patient declines. Advised patient to call back to office with any issues, questions, or concerns. Patient verbalized understanding.

## 2023-03-19 ENCOUNTER — Other Ambulatory Visit (HOSPITAL_COMMUNITY): Payer: Self-pay

## 2023-03-21 ENCOUNTER — Other Ambulatory Visit (HOSPITAL_COMMUNITY): Payer: Self-pay

## 2023-03-21 MED FILL — Amiodarone HCl Tab 200 MG: ORAL | 90 days supply | Qty: 60 | Fill #0 | Status: AC

## 2023-03-30 NOTE — Progress Notes (Signed)
Remote ICD transmission.   

## 2023-04-13 ENCOUNTER — Other Ambulatory Visit (HOSPITAL_COMMUNITY): Payer: Self-pay

## 2023-04-25 ENCOUNTER — Other Ambulatory Visit (HOSPITAL_COMMUNITY): Payer: Self-pay | Admitting: Cardiology

## 2023-04-28 ENCOUNTER — Other Ambulatory Visit (HOSPITAL_COMMUNITY): Payer: Self-pay

## 2023-04-28 MED FILL — Spironolactone Tab 25 MG: ORAL | 90 days supply | Qty: 90 | Fill #0 | Status: AC

## 2023-05-13 ENCOUNTER — Other Ambulatory Visit (HOSPITAL_COMMUNITY): Payer: Self-pay

## 2023-06-08 ENCOUNTER — Other Ambulatory Visit (HOSPITAL_COMMUNITY): Payer: Self-pay

## 2023-06-08 MED FILL — Rosuvastatin Calcium Tab 5 MG: ORAL | 90 days supply | Qty: 90 | Fill #1 | Status: AC

## 2023-06-16 ENCOUNTER — Ambulatory Visit (INDEPENDENT_AMBULATORY_CARE_PROVIDER_SITE_OTHER): Payer: Medicare Other

## 2023-06-16 DIAGNOSIS — I5022 Chronic systolic (congestive) heart failure: Secondary | ICD-10-CM

## 2023-06-17 ENCOUNTER — Other Ambulatory Visit (HOSPITAL_COMMUNITY): Payer: Self-pay

## 2023-06-17 MED FILL — Amiodarone HCl Tab 200 MG: ORAL | 90 days supply | Qty: 60 | Fill #1 | Status: AC

## 2023-06-20 ENCOUNTER — Telehealth: Payer: Self-pay

## 2023-06-20 LAB — CUP PACEART REMOTE DEVICE CHECK
Battery Remaining Longevity: 84 mo
Battery Remaining Percentage: 70 %
Battery Voltage: 2.98 V
Brady Statistic RV Percent Paced: 1 %
Date Time Interrogation Session: 20241103124210
HighPow Impedance: 65 Ohm
Implantable Lead Connection Status: 753985
Implantable Lead Implant Date: 20210805
Implantable Lead Location: 753860
Implantable Pulse Generator Implant Date: 20210805
Lead Channel Impedance Value: 390 Ohm
Lead Channel Pacing Threshold Amplitude: 0.5 V
Lead Channel Pacing Threshold Pulse Width: 0.5 ms
Lead Channel Sensing Intrinsic Amplitude: 11.7 mV
Lead Channel Setting Pacing Amplitude: 2.5 V
Lead Channel Setting Pacing Pulse Width: 0.5 ms
Lead Channel Setting Sensing Sensitivity: 0.5 mV
Pulse Gen Serial Number: 111025604
Zone Setting Status: 755011

## 2023-06-20 NOTE — Telephone Encounter (Signed)
Open in error

## 2023-07-04 NOTE — Progress Notes (Signed)
Remote ICD transmission.   

## 2023-07-12 ENCOUNTER — Other Ambulatory Visit: Payer: Self-pay

## 2023-07-21 ENCOUNTER — Other Ambulatory Visit (HOSPITAL_COMMUNITY): Payer: Self-pay | Admitting: Cardiology

## 2023-07-21 ENCOUNTER — Other Ambulatory Visit (HOSPITAL_COMMUNITY): Payer: Self-pay

## 2023-07-25 ENCOUNTER — Other Ambulatory Visit (HOSPITAL_COMMUNITY): Payer: Self-pay

## 2023-07-25 MED ORDER — SPIRONOLACTONE 25 MG PO TABS
25.0000 mg | ORAL_TABLET | Freq: Every day | ORAL | 3 refills | Status: DC
  Filled 2023-07-25: qty 90, 90d supply, fill #0
  Filled 2023-10-21: qty 90, 90d supply, fill #1
  Filled 2023-12-26 – 2024-01-02 (×2): qty 90, 90d supply, fill #2

## 2023-08-05 DIAGNOSIS — G4733 Obstructive sleep apnea (adult) (pediatric): Secondary | ICD-10-CM | POA: Diagnosis not present

## 2023-08-09 ENCOUNTER — Other Ambulatory Visit (HOSPITAL_COMMUNITY): Payer: Self-pay | Admitting: Cardiology

## 2023-08-09 ENCOUNTER — Other Ambulatory Visit (HOSPITAL_COMMUNITY): Payer: Self-pay

## 2023-08-11 ENCOUNTER — Other Ambulatory Visit (HOSPITAL_COMMUNITY): Payer: Self-pay

## 2023-08-11 ENCOUNTER — Other Ambulatory Visit: Payer: Self-pay

## 2023-08-11 MED ORDER — ENTRESTO 49-51 MG PO TABS
1.0000 | ORAL_TABLET | Freq: Two times a day (BID) | ORAL | 1 refills | Status: DC
  Filled 2023-08-11 – 2023-09-30 (×3): qty 60, 30d supply, fill #0
  Filled 2023-11-07: qty 60, 30d supply, fill #1

## 2023-08-29 ENCOUNTER — Telehealth: Payer: Self-pay

## 2023-08-29 NOTE — Telephone Encounter (Addendum)
 Alert received from [employer] for VT/VF ICD shock. There were 29 NSVT arrhythmias detected.  These were irregular in nature.   There was one VT/VF arrhythmia that failed ATP and was successfully converted with an ICD shock.    [place] in the cold for 3 hours. States while he was walking outside he felt the shock. Patient denied any symptoms other than feeling extremely cold. States Friday/Saturday he missed his medications. States he is back on track taking them and doing well. Stressed compliance to patient for taking medications daily and not missing doses.  Advised pt of [place] DMV driving restrictions x6 months and shock plan w/ verbal understanding.

## 2023-09-01 NOTE — Telephone Encounter (Signed)
Patient called advised the event was AF w/ RVR which does not give him driving restrictions from this shock. Pt appreciative of call.

## 2023-09-03 ENCOUNTER — Other Ambulatory Visit (HOSPITAL_COMMUNITY): Payer: Self-pay

## 2023-09-05 ENCOUNTER — Other Ambulatory Visit (HOSPITAL_COMMUNITY): Payer: Self-pay

## 2023-09-05 ENCOUNTER — Other Ambulatory Visit (HOSPITAL_COMMUNITY): Payer: Self-pay | Admitting: Cardiology

## 2023-09-05 MED ORDER — FUROSEMIDE 20 MG PO TABS: 60.0000 mg | ORAL_TABLET | Freq: Every day | ORAL | 1 refills | Status: DC

## 2023-09-05 MED FILL — Rosuvastatin Calcium Tab 5 MG: ORAL | 90 days supply | Qty: 90 | Fill #2 | Status: AC

## 2023-09-06 ENCOUNTER — Other Ambulatory Visit (HOSPITAL_COMMUNITY): Payer: Self-pay

## 2023-09-07 ENCOUNTER — Telehealth (HOSPITAL_COMMUNITY): Payer: Self-pay

## 2023-09-07 NOTE — Telephone Encounter (Signed)
Advanced Heart Failure Patient Advocate Encounter  Spoke to patient by phone regarding cost of Eliquis. Discussed deductible for 2025, along with copay information, and 3% out of pocket criteria for BMS assistance. Pt expressed understanding.  Medication Samples have been left at registration desk for patient pick up. Drug name: Eliquis 5 MG Qty: 4x 14 ct packages LOT: [id number] Exp.: 10/2024 SIG: Take 1 tablet by mouth twice daily   The patient has been instructed regarding the correct time, dose, and frequency of taking this medication, including desired effects and most common side effects.   Burnell Blanks, CPhT Rx Patient Advocate Phone: [phone]

## 2023-09-08 ENCOUNTER — Telehealth: Payer: Self-pay

## 2023-09-08 NOTE — Telephone Encounter (Addendum)
Alert received from [employer] for VT arrhythmia, 206 bpm, that required 2 bursts of ATP to convert to a fast irregular rhythm.  Both of these were on 09/07/2023.  Patient has had hx of inappropriate ATP/shocks to AF with RVR. Routing to Dr. Ladona Ridgel to review. Appears irregular although section below appears regular.

## 2023-09-12 NOTE — Telephone Encounter (Signed)
Looks like both atrial flutter and fib wit a RVR. Continue current meds.

## 2023-09-15 ENCOUNTER — Other Ambulatory Visit: Payer: Self-pay | Admitting: Internal Medicine

## 2023-09-15 ENCOUNTER — Other Ambulatory Visit: Payer: Self-pay

## 2023-09-15 ENCOUNTER — Ambulatory Visit (INDEPENDENT_AMBULATORY_CARE_PROVIDER_SITE_OTHER): Payer: BC Managed Care – PPO

## 2023-09-15 ENCOUNTER — Other Ambulatory Visit (HOSPITAL_COMMUNITY): Payer: Self-pay

## 2023-09-15 DIAGNOSIS — I255 Ischemic cardiomyopathy: Secondary | ICD-10-CM | POA: Diagnosis not present

## 2023-09-15 LAB — CUP PACEART REMOTE DEVICE CHECK
Battery Remaining Longevity: 81 mo
Battery Remaining Percentage: 67 %
Battery Voltage: 2.98 V
Brady Statistic RV Percent Paced: 1 %
Date Time Interrogation Session: 20250130020015
HighPow Impedance: 80 Ohm
Implantable Lead Connection Status: 753985
Implantable Lead Implant Date: 20210805
Implantable Lead Location: 753860
Implantable Pulse Generator Implant Date: 20210805
Lead Channel Impedance Value: 390 Ohm
Lead Channel Pacing Threshold Amplitude: 0.5 V
Lead Channel Pacing Threshold Pulse Width: 0.5 ms
Lead Channel Sensing Intrinsic Amplitude: 11.7 mV
Lead Channel Setting Pacing Amplitude: 2.5 V
Lead Channel Setting Pacing Pulse Width: 0.5 ms
Lead Channel Setting Sensing Sensitivity: 0.5 mV
Pulse Gen Serial Number: 111025604
Zone Setting Status: 755011

## 2023-09-15 MED ORDER — AMIODARONE HCL 200 MG PO TABS
ORAL_TABLET | ORAL | 5 refills | Status: DC
  Filled 2023-09-15: qty 20, 30d supply, fill #0
  Filled 2023-10-13: qty 20, 30d supply, fill #1
  Filled 2023-12-02: qty 20, 30d supply, fill #2
  Filled 2023-12-28: qty 20, 30d supply, fill #3

## 2023-09-19 ENCOUNTER — Encounter: Payer: Self-pay | Admitting: Internal Medicine

## 2023-09-23 DIAGNOSIS — I4819 Other persistent atrial fibrillation: Secondary | ICD-10-CM | POA: Diagnosis not present

## 2023-09-30 ENCOUNTER — Other Ambulatory Visit (HOSPITAL_BASED_OUTPATIENT_CLINIC_OR_DEPARTMENT_OTHER): Payer: Self-pay

## 2023-09-30 ENCOUNTER — Other Ambulatory Visit (HOSPITAL_COMMUNITY): Payer: Self-pay

## 2023-10-13 ENCOUNTER — Other Ambulatory Visit: Payer: Self-pay

## 2023-10-13 ENCOUNTER — Encounter (HOSPITAL_COMMUNITY): Payer: Medicare Other | Admitting: Cardiology

## 2023-10-14 ENCOUNTER — Other Ambulatory Visit (HOSPITAL_COMMUNITY): Payer: Self-pay

## 2023-10-20 DIAGNOSIS — Z0000 Encounter for general adult medical examination without abnormal findings: Secondary | ICD-10-CM | POA: Diagnosis not present

## 2023-10-20 DIAGNOSIS — R7301 Impaired fasting glucose: Secondary | ICD-10-CM | POA: Diagnosis not present

## 2023-10-20 DIAGNOSIS — M109 Gout, unspecified: Secondary | ICD-10-CM | POA: Diagnosis not present

## 2023-10-20 DIAGNOSIS — E785 Hyperlipidemia, unspecified: Secondary | ICD-10-CM | POA: Diagnosis not present

## 2023-10-20 DIAGNOSIS — N529 Male erectile dysfunction, unspecified: Secondary | ICD-10-CM | POA: Diagnosis not present

## 2023-10-20 DIAGNOSIS — Z0189 Encounter for other specified special examinations: Secondary | ICD-10-CM | POA: Diagnosis not present

## 2023-10-20 DIAGNOSIS — Z125 Encounter for screening for malignant neoplasm of prostate: Secondary | ICD-10-CM | POA: Diagnosis not present

## 2023-10-20 DIAGNOSIS — E871 Hypo-osmolality and hyponatremia: Secondary | ICD-10-CM | POA: Diagnosis not present

## 2023-10-21 ENCOUNTER — Other Ambulatory Visit (HOSPITAL_COMMUNITY): Payer: Self-pay | Admitting: Cardiology

## 2023-10-21 ENCOUNTER — Other Ambulatory Visit (HOSPITAL_COMMUNITY): Payer: Self-pay

## 2023-10-21 NOTE — Progress Notes (Signed)
 Remote ICD transmission.

## 2023-10-24 ENCOUNTER — Other Ambulatory Visit (HOSPITAL_COMMUNITY): Payer: Self-pay

## 2023-10-24 ENCOUNTER — Other Ambulatory Visit (HOSPITAL_BASED_OUTPATIENT_CLINIC_OR_DEPARTMENT_OTHER): Payer: Self-pay

## 2023-10-24 MED ORDER — APIXABAN 5 MG PO TABS
5.0000 mg | ORAL_TABLET | Freq: Two times a day (BID) | ORAL | 0 refills | Status: DC
  Filled 2023-10-24 – 2023-10-26 (×2): qty 180, 90d supply, fill #0

## 2023-10-25 ENCOUNTER — Other Ambulatory Visit: Payer: Self-pay

## 2023-10-25 ENCOUNTER — Encounter: Payer: Self-pay | Admitting: Pharmacist

## 2023-10-25 ENCOUNTER — Other Ambulatory Visit (HOSPITAL_COMMUNITY): Payer: Self-pay

## 2023-10-26 ENCOUNTER — Other Ambulatory Visit: Payer: Self-pay

## 2023-10-26 ENCOUNTER — Other Ambulatory Visit (HOSPITAL_COMMUNITY): Payer: Self-pay

## 2023-10-27 ENCOUNTER — Other Ambulatory Visit: Payer: Self-pay

## 2023-10-30 ENCOUNTER — Other Ambulatory Visit (HOSPITAL_COMMUNITY): Payer: Self-pay | Admitting: Cardiology

## 2023-11-07 ENCOUNTER — Other Ambulatory Visit (HOSPITAL_COMMUNITY): Payer: Self-pay

## 2023-11-08 ENCOUNTER — Other Ambulatory Visit (HOSPITAL_COMMUNITY): Payer: Self-pay

## 2023-11-08 ENCOUNTER — Encounter: Payer: Self-pay | Admitting: Pharmacist

## 2023-11-08 ENCOUNTER — Other Ambulatory Visit: Payer: Self-pay

## 2023-11-10 DIAGNOSIS — G4733 Obstructive sleep apnea (adult) (pediatric): Secondary | ICD-10-CM | POA: Diagnosis not present

## 2023-11-14 ENCOUNTER — Other Ambulatory Visit (HOSPITAL_COMMUNITY): Payer: Self-pay

## 2023-11-14 ENCOUNTER — Other Ambulatory Visit: Payer: Self-pay

## 2023-11-14 ENCOUNTER — Other Ambulatory Visit (HOSPITAL_COMMUNITY): Payer: Self-pay | Admitting: Cardiology

## 2023-11-15 ENCOUNTER — Other Ambulatory Visit (HOSPITAL_COMMUNITY): Payer: Self-pay

## 2023-11-15 ENCOUNTER — Other Ambulatory Visit: Payer: Self-pay

## 2023-11-15 MED ORDER — EMPAGLIFLOZIN 10 MG PO TABS
10.0000 mg | ORAL_TABLET | Freq: Every day | ORAL | 11 refills | Status: DC
  Filled 2023-11-15: qty 30, 30d supply, fill #0
  Filled 2023-12-16: qty 30, 30d supply, fill #1
  Filled 2024-01-14 (×2): qty 30, 30d supply, fill #2
  Filled 2024-02-13: qty 30, 30d supply, fill #3
  Filled 2024-03-12: qty 30, 30d supply, fill #4
  Filled 2024-04-11: qty 30, 30d supply, fill #5
  Filled 2024-05-09 – 2024-05-10 (×2): qty 30, 30d supply, fill #6
  Filled 2024-06-06 – 2024-06-11 (×2): qty 30, 30d supply, fill #7
  Filled 2024-07-07 – 2024-07-09 (×2): qty 30, 30d supply, fill #8
  Filled 2024-08-06: qty 30, 30d supply, fill #9
  Filled 2024-09-05: qty 30, 30d supply, fill #10

## 2023-11-16 ENCOUNTER — Other Ambulatory Visit (HOSPITAL_COMMUNITY): Payer: Self-pay

## 2023-11-18 ENCOUNTER — Other Ambulatory Visit: Payer: Self-pay

## 2023-11-29 ENCOUNTER — Other Ambulatory Visit (HOSPITAL_COMMUNITY): Payer: Self-pay | Admitting: Cardiology

## 2023-12-02 ENCOUNTER — Other Ambulatory Visit (HOSPITAL_COMMUNITY): Payer: Self-pay | Admitting: Cardiology

## 2023-12-02 ENCOUNTER — Other Ambulatory Visit (HOSPITAL_COMMUNITY): Payer: Self-pay

## 2023-12-02 ENCOUNTER — Other Ambulatory Visit: Payer: Self-pay | Admitting: Cardiology

## 2023-12-05 ENCOUNTER — Other Ambulatory Visit (HOSPITAL_COMMUNITY): Payer: Self-pay

## 2023-12-05 ENCOUNTER — Other Ambulatory Visit: Payer: Self-pay

## 2023-12-05 ENCOUNTER — Encounter (HOSPITAL_COMMUNITY): Admitting: Cardiology

## 2023-12-05 MED ORDER — ROSUVASTATIN CALCIUM 5 MG PO TABS
5.0000 mg | ORAL_TABLET | Freq: Every day | ORAL | 0 refills | Status: DC
  Filled 2023-12-05: qty 90, 90d supply, fill #0

## 2023-12-05 MED ORDER — ENTRESTO 49-51 MG PO TABS
1.0000 | ORAL_TABLET | Freq: Two times a day (BID) | ORAL | 0 refills | Status: DC
  Filled 2023-12-05: qty 60, 30d supply, fill #0

## 2023-12-14 ENCOUNTER — Encounter: Admitting: Cardiology

## 2023-12-15 ENCOUNTER — Ambulatory Visit (INDEPENDENT_AMBULATORY_CARE_PROVIDER_SITE_OTHER): Payer: BC Managed Care – PPO

## 2023-12-15 DIAGNOSIS — I255 Ischemic cardiomyopathy: Secondary | ICD-10-CM

## 2023-12-16 ENCOUNTER — Other Ambulatory Visit (HOSPITAL_COMMUNITY): Payer: Self-pay

## 2023-12-16 LAB — CUP PACEART REMOTE DEVICE CHECK
Battery Remaining Longevity: 76 mo
Battery Remaining Percentage: 65 %
Battery Voltage: 2.96 V
Brady Statistic RV Percent Paced: 1 %
Date Time Interrogation Session: 20250502145401
HighPow Impedance: 77 Ohm
Implantable Lead Connection Status: 753985
Implantable Lead Implant Date: 20210805
Implantable Lead Location: 753860
Implantable Pulse Generator Implant Date: 20210805
Lead Channel Impedance Value: 390 Ohm
Lead Channel Pacing Threshold Amplitude: 0.5 V
Lead Channel Pacing Threshold Pulse Width: 0.5 ms
Lead Channel Sensing Intrinsic Amplitude: 12 mV
Lead Channel Setting Pacing Amplitude: 2.5 V
Lead Channel Setting Pacing Pulse Width: 0.5 ms
Lead Channel Setting Sensing Sensitivity: 0.5 mV
Pulse Gen Serial Number: 111025604
Zone Setting Status: 755011

## 2023-12-18 ENCOUNTER — Encounter: Payer: Self-pay | Admitting: Internal Medicine

## 2023-12-19 ENCOUNTER — Other Ambulatory Visit (HOSPITAL_COMMUNITY): Payer: Self-pay

## 2023-12-19 ENCOUNTER — Telehealth (HOSPITAL_COMMUNITY): Payer: Self-pay

## 2023-12-19 ENCOUNTER — Other Ambulatory Visit: Payer: Self-pay

## 2023-12-19 ENCOUNTER — Encounter: Payer: Self-pay | Admitting: Pharmacist

## 2023-12-19 NOTE — Telephone Encounter (Signed)
 Advanced Heart Failure Patient Advocate Encounter  The patient was approved for a [employer] grant that will help cover the cost of Jardiance , Entresto .  Total amount awarded, $4,500.  Effective: 11/19/2023 - 11/17/2024.  BIN [id number] PCN [id number] Group [id number] ID [id number]  Pharmacy provided with approval and processing information. Confirmed $0 copay for Jardiance . Patient informed via phone.  Kennis Peacock, CPhT Rx Patient Advocate Phone: [phone]

## 2023-12-20 ENCOUNTER — Other Ambulatory Visit (HOSPITAL_COMMUNITY): Payer: Self-pay

## 2023-12-20 ENCOUNTER — Other Ambulatory Visit: Payer: Self-pay

## 2023-12-26 ENCOUNTER — Other Ambulatory Visit (HOSPITAL_COMMUNITY): Payer: Self-pay

## 2023-12-27 ENCOUNTER — Other Ambulatory Visit (HOSPITAL_COMMUNITY): Payer: Self-pay | Admitting: Cardiology

## 2023-12-28 ENCOUNTER — Other Ambulatory Visit (HOSPITAL_COMMUNITY): Payer: Self-pay

## 2023-12-28 ENCOUNTER — Other Ambulatory Visit (HOSPITAL_COMMUNITY): Payer: Self-pay | Admitting: Cardiology

## 2023-12-29 ENCOUNTER — Other Ambulatory Visit (HOSPITAL_COMMUNITY): Payer: Self-pay

## 2023-12-29 ENCOUNTER — Telehealth (HOSPITAL_COMMUNITY): Payer: Self-pay | Admitting: Cardiology

## 2023-12-29 MED ORDER — ENTRESTO 49-51 MG PO TABS
1.0000 | ORAL_TABLET | Freq: Two times a day (BID) | ORAL | 0 refills | Status: DC
  Filled 2023-12-29 – 2024-01-02 (×2): qty 60, 30d supply, fill #0

## 2023-12-29 NOTE — Telephone Encounter (Signed)
 Patient just called front office and stated that someone called him a few minutes ago from Dr. Charline Contras office. Pt states the call rang three times and then dropped.   Patient would like for someone to fins out what is going on and to give him a call back from staff.   Patient telephone number is [phone].   Please advise.   Thank you!

## 2023-12-29 NOTE — Telephone Encounter (Signed)
 Spoke w/pt.

## 2024-01-02 ENCOUNTER — Other Ambulatory Visit: Payer: Self-pay

## 2024-01-03 ENCOUNTER — Other Ambulatory Visit: Payer: Self-pay

## 2024-01-11 ENCOUNTER — Telehealth: Payer: Self-pay | Admitting: Cardiology

## 2024-01-11 NOTE — Telephone Encounter (Signed)
 Called to confirm/remind patient of their appointment at the [facility] on 01/12/24.   Appointment:   [] Confirmed  [x] Left mess   [] No answer/No voice mail  [] VM Full/unable to leave message  [] Phone not in service  Patient reminded to bring all medications and/or complete list.  Confirmed patient has transportation. Gave directions, instructed to utilize valet parking.

## 2024-01-12 ENCOUNTER — Other Ambulatory Visit (HOSPITAL_COMMUNITY): Payer: Self-pay

## 2024-01-12 ENCOUNTER — Other Ambulatory Visit: Payer: Self-pay

## 2024-01-12 ENCOUNTER — Ambulatory Visit: Attending: Cardiology | Admitting: Cardiology

## 2024-01-12 ENCOUNTER — Other Ambulatory Visit (HOSPITAL_COMMUNITY): Payer: Self-pay | Admitting: Cardiology

## 2024-01-12 VITALS — BP 92/73 | HR 128 | Ht 73.0 in | Wt 278.8 lb

## 2024-01-12 DIAGNOSIS — F419 Anxiety disorder, unspecified: Secondary | ICD-10-CM | POA: Insufficient documentation

## 2024-01-12 DIAGNOSIS — I491 Atrial premature depolarization: Secondary | ICD-10-CM | POA: Diagnosis not present

## 2024-01-12 DIAGNOSIS — Z95810 Presence of automatic (implantable) cardiac defibrillator: Secondary | ICD-10-CM | POA: Insufficient documentation

## 2024-01-12 DIAGNOSIS — I2510 Atherosclerotic heart disease of native coronary artery without angina pectoris: Secondary | ICD-10-CM | POA: Diagnosis not present

## 2024-01-12 DIAGNOSIS — I255 Ischemic cardiomyopathy: Secondary | ICD-10-CM | POA: Diagnosis not present

## 2024-01-12 DIAGNOSIS — R519 Headache, unspecified: Secondary | ICD-10-CM | POA: Diagnosis not present

## 2024-01-12 DIAGNOSIS — I4510 Unspecified right bundle-branch block: Secondary | ICD-10-CM | POA: Diagnosis not present

## 2024-01-12 DIAGNOSIS — Z9889 Other specified postprocedural states: Secondary | ICD-10-CM | POA: Insufficient documentation

## 2024-01-12 DIAGNOSIS — I4819 Other persistent atrial fibrillation: Secondary | ICD-10-CM

## 2024-01-12 DIAGNOSIS — E669 Obesity, unspecified: Secondary | ICD-10-CM | POA: Insufficient documentation

## 2024-01-12 DIAGNOSIS — I480 Paroxysmal atrial fibrillation: Secondary | ICD-10-CM | POA: Insufficient documentation

## 2024-01-12 DIAGNOSIS — Z87891 Personal history of nicotine dependence: Secondary | ICD-10-CM | POA: Diagnosis not present

## 2024-01-12 DIAGNOSIS — I4892 Unspecified atrial flutter: Secondary | ICD-10-CM | POA: Insufficient documentation

## 2024-01-12 DIAGNOSIS — I5022 Chronic systolic (congestive) heart failure: Secondary | ICD-10-CM | POA: Insufficient documentation

## 2024-01-12 DIAGNOSIS — Z79899 Other long term (current) drug therapy: Secondary | ICD-10-CM | POA: Insufficient documentation

## 2024-01-12 DIAGNOSIS — I671 Cerebral aneurysm, nonruptured: Secondary | ICD-10-CM | POA: Diagnosis not present

## 2024-01-12 DIAGNOSIS — Z7901 Long term (current) use of anticoagulants: Secondary | ICD-10-CM | POA: Diagnosis not present

## 2024-01-12 DIAGNOSIS — G4733 Obstructive sleep apnea (adult) (pediatric): Secondary | ICD-10-CM | POA: Diagnosis not present

## 2024-01-12 DIAGNOSIS — Z7984 Long term (current) use of oral hypoglycemic drugs: Secondary | ICD-10-CM | POA: Insufficient documentation

## 2024-01-12 DIAGNOSIS — F32A Depression, unspecified: Secondary | ICD-10-CM | POA: Diagnosis not present

## 2024-01-12 MED ORDER — AMIODARONE HCL 200 MG PO TABS
ORAL_TABLET | ORAL | 3 refills | Status: DC
  Filled 2024-01-12: qty 60, 50d supply, fill #0
  Filled 2024-01-12: qty 60, 30d supply, fill #0

## 2024-01-12 MED ORDER — ENTRESTO 49-51 MG PO TABS
1.0000 | ORAL_TABLET | Freq: Two times a day (BID) | ORAL | 5 refills | Status: DC
  Filled 2024-01-12 – 2024-02-01 (×2): qty 60, 30d supply, fill #0
  Filled 2024-02-27 – 2024-03-01 (×2): qty 60, 30d supply, fill #1
  Filled 2024-03-28 – 2024-04-02 (×3): qty 60, 30d supply, fill #2
  Filled 2024-04-23 – 2024-04-28 (×3): qty 60, 30d supply, fill #3
  Filled 2024-05-23 – 2024-05-28 (×2): qty 60, 30d supply, fill #4
  Filled 2024-06-22 – 2024-06-25 (×2): qty 60, 30d supply, fill #5
  Filled ????-??-??: fill #2

## 2024-01-12 MED ORDER — DOXYCYCLINE HYCLATE 100 MG PO TABS
100.0000 mg | ORAL_TABLET | Freq: Two times a day (BID) | ORAL | 0 refills | Status: DC
  Filled 2024-01-12 (×2): qty 14, 7d supply, fill #0

## 2024-01-12 MED ORDER — APIXABAN 5 MG PO TABS
5.0000 mg | ORAL_TABLET | Freq: Two times a day (BID) | ORAL | 3 refills | Status: AC
  Filled 2024-01-12 (×2): qty 180, 90d supply, fill #0
  Filled 2024-04-22: qty 180, 90d supply, fill #1
  Filled 2024-07-17: qty 180, 90d supply, fill #2

## 2024-01-12 NOTE — Patient Instructions (Addendum)
 Medication Changes:  START: DOXYCYCLINE  100MG  TWICE DAILY FOR 7 DAYS   INCREASE AMIODARONE  TO 200MG  TWICE DAILY UNTIL CARDIOVERSION THEN START 200MG  ONCE DAILY THEREAFTER   Lab Work:  Go DOWN to LOWER LEVEL (LL) to have your blood work completed inside of [employer] office.  We will only call you if the results are abnormal or if the provider would like to make medication changes.  Testing/Procedures:  Your physician has recommended that you have a Cardioversion (DCCV). Electrical Cardioversion uses a jolt of electricity to your heart either through paddles or wired patches attached to your chest. This is a controlled, usually prescheduled, procedure. Defibrillation is done under light anesthesia in the hospital, and you usually go home the day of the procedure. This is done to get your heart back into a normal rhythm. You are not awake for the procedure. Please see the instruction sheet given to you today.  ECHOCARDIOGRAM AS SCHEDULED IN [place]   Special Instructions // Education:  STOP DRINKING ALCOHOL    Dear Richard Graham  You are scheduled for a TEE (Transesophageal Echocardiogram) Guided Cardioversion on Tuesday, June 3 with Dr. [name] .  Please arrive at the [facility] (Main Entrance A) at [facility]: [address] [place], [place] [address] at 7:30 AM (This time is 1 hour(s) before your procedure to ensure your preparation).   Free valet parking service is available. You will check in at ADMITTING.   *Please Note: You will receive a call the day before your procedure to confirm the appointment time. That time may have changed from the original time based on the schedule for that day.*    DIET:  Nothing to eat or drink after midnight except a sip of water with medications (see medication instructions below)  MEDICATION INSTRUCTIONS: !!IF ANY NEW MEDICATIONS ARE STARTED AFTER TODAY, PLEASE NOTIFY YOUR PROVIDER AS SOON AS POSSIBLE!!  FYI: Medications such as  Semaglutide (Ozempic, [place]), Tirzepatide (Mounjaro, Zepbound), Dulaglutide (Trulicity), etc ("GLP1 agonists") AND Canagliflozin (Invokana), Dapagliflozin  (Farxiga ), Empagliflozin  (Jardiance ), Ertugliflozin (Steglatro), Bexagliflozin [employer]) or any combination with one of these drugs such as Invokamet (Canagliflozin/Metformin), Synjardy (Empagliflozin /Metformin), etc ("SGLT2 inhibitors") must be held around the time of a procedure. This is not a comprehensive list of all of these drugs. Please review all of your medications and talk to your provider if you take any one of these. If you are not sure, ask your provider.          :1}HOLD: Empagliflozin  (Jardiance ) for 3 days prior to the procedure. Last dose on Friday, May   DO NOT TAKE FUROSEMIDE  THE MORNING OF PROCEDURE   DO NOT TAKE SPIRONOLACTONE  THE MORNING OF PROCEDURE      :1}Continue taking your anticoagulant (blood thinner): Apixaban  (Eliquis ).  You will need to continue this after your procedure until you are told by your provider that it is safe to stop.    LABS:  TODAY   FYI:  For your safety, and to allow us  to monitor your vital signs accurately during the surgery/procedure we request: If you have artificial nails, gel coating, SNS etc, please have those removed prior to your surgery/procedure. Not having the nail coverings /polish removed may result in cancellation or delay of your surgery/procedure.  Your support person will be asked to wait in the waiting room during your procedure.  It is OK to have someone drop you off and come back when you are ready to be discharged.  You cannot drive after the procedure and  will need someone to drive you home.  Bring your insurance cards.  *Special Note: Every effort is made to have your procedure done on time. Occasionally there are emergencies that occur at the hospital that may cause delays. Please be patient if a delay does occur.    Follow-Up in: 3 WEEKS AS SCHEDULED IN [place]    At the [facility], you and your health needs are our priority. We have a designated team specialized in the treatment of Heart Failure. This Care Team includes your primary Heart Failure Specialized Cardiologist (physician), Advanced Practice Providers (APPs- Physician Assistants and Nurse Practitioners), and Pharmacist who all work together to provide you with the care you need, when you need it.   You may see any of the following providers on your designated Care Team at your next follow up:  Dr. Jules Oar Dr. Peder Bourdon Dr. Alwin Baars Dr. Judyth Nunnery Nieves Bars, NP Ruddy Corral, [place] [facility] [place], [place] Dennise Fitz, NP [place] Lee, NP Luster Salters, PharmD   Please be sure to bring in all your medications bottles to every appointment.   Need to Contact Us :  If you have any questions or concerns before your next appointment please send us  a message through [place] or call our office at [phone].    TO LEAVE A MESSAGE FOR THE NURSE SELECT OPTION 2, PLEASE LEAVE A MESSAGE INCLUDING: YOUR NAME DATE OF BIRTH CALL BACK NUMBER REASON FOR CALL**this is important as we prioritize the call backs  YOU WILL RECEIVE A CALL BACK THE SAME DAY AS LONG AS YOU CALL BEFORE 4:00 PM

## 2024-01-12 NOTE — H&P (View-Only) (Signed)
 PCP: [name], DO Cardiology: Dr. [name] HF Cardiology: Dr. [name]  Chief complaint: CHF, atrial fibrillation.   68 y.o. with history of CAD, PAF, and ischemic cardiomyopathy was referred by Dr. [name] for evaluation of CHF.  Patent was admitted to [facility] in 3/21 with CHF and atrial fibrillation/RVR. Echo showed EF 20-25%.  LHC was done, showing occluded mid LAD with collaterals.  Cardiac MRI showed EF 22%, apical aneurysm, no significant LAD territory viability; therefore, patient did not have CABG or PCI. Patient was additionally found to have right MCA aneurysm.  This was treated with embolization in 4/21.  In 5/21, he had DCCV back to NSR.  He has remained in NSR on amiodarone .  Repeat echo in 6/21 showed EF 20-25% with moderately decreased RV systolic function.  St Jude ICD was placed.  Patient has also been diagnosed with OSA and has started CPAP.   CPX 11/21 with no clear HF limitation.   Echo in 12/22 showed EF 35-40%, septal/apical akinesis, inferoapical dyskinesis, mild LV enlargement, RV normal, IVC normal.   He had an ICD shock in 1/25 due to AF/RVR.   Patient returns for followup of CHF.  I have not seen him in over a year but he follows with Dr. [name] for his ICD.  He is in atrial flutter with RVR in 120s today.  He does not feel palpitations.  He has been taking all his meds.  Weight has trended up and he is drinking about 3 [employer] Lemonades/day.  He missed a few days of Eliquis  last week when he went out of town.  No dyspnea walking on flat ground.  No chest pain.  No orthopnea/PND.  No lightheadedness or syncope.  He reports headaches on and off, had 2 ticks taken off him in the last week, 1 was engorged with blood. No rash.   ECG (personally reviewed): Atrial flutter rate 127 with RBBB, old ASMI.   Labs (9/22): LDL 59 Labs (12/22): K 4.2, creatinine 1.09, hgb 16.5.  Labs (11/23): K 4.7, creatinine 1.11  PMH: 1. Cerebral aneurysm: Right MCA aneurysm, embolized in 4/21  by IR.  2. Atrial fibrillation: Paroxysmal.  - DCCV 5/21 - Inappropriate ICD shock in 1/25 for AF/RVR.  3. CAD: LHC (3/21) with totally occluded mid LAD with collaterals.  4. Chronic systolic CHF: Ischemic cardiomyopathy.   - Cardiac MRI (5/21): EF 22%, apical aneurysm, LAD territory scarring without evidence for significant viability. RV EF 31%.  - Echo (6/21): EF 20-25%, moderately decreased RV systolic function.  - St Jude ICD.  - CPX (11/21): peak VO2 17.9 (81% predicted), VE/VCO2 slope 25, RER 1.17 => no clear HF limitation.  - Echo (9/22): EF 35-40% with peri-apical akinesis and apical inferior aneurysm, no LV thrombus noted, mildly decreased RV systolic function, normal IVC.  - Echo (12/22): EF 35-40%, septal/apical akinesis, inferoapical dyskinesis, mild LV enlargement, RV normal, IVC normal 5. Gout 6. OSA: Using CPAP.  7. Osteoarthritis 8. Depression/anxiety  Social History   Socioeconomic History   Marital status: Married    Spouse name: Not on file   Number of children: 0   Years of education: Not on file   Highest education level: Some college, no degree  Occupational History   Not on file  Tobacco Use   Smoking status: Former    Current packs/day: 0.00    Average packs/day: 0.3 packs/day for 3.0 years (0.8 ttl pk-yrs)    Types: Cigarettes    Start date: 08/16/2008  Quit date: 08/17/2011    Years since quitting: 12.4   Smokeless tobacco: Never  Vaping Use   Vaping status: Never Used  Substance and Sexual Activity   Alcohol  use: Yes    Alcohol /week: 2.0 standard drinks of alcohol     Types: 2 Glasses of wine per week   Drug use: No   Sexual activity: Yes  Other Topics Concern   Not on file  Social History Narrative   Not on file   Social Drivers of Health   Financial Resource Strain: Not on file  Food Insecurity: Not on file  Transportation Needs: Not on file  Physical Activity: Not on file  Stress: Not on file  Social Connections: Not on file   Intimate Partner Violence: Not on file   FH: No premature CAD or CHF.   ROS: All systems reviewed and negative except as per HPI.   Current Outpatient Medications  Medication Sig Dispense Refill   Ascorbic Acid (VITAMIN C) 1000 MG tablet Take 1,000 mg by mouth daily. Vitafusion     cephALEXin  (KEFLEX ) 500 MG capsule Take 1 capsule (500 mg total) by mouth 4 (four) times daily for 7 days 28 capsule 0   colchicine 0.6 MG tablet Take 0.6 mg by mouth 2 (two) times daily as needed (for gout flares).      Cyanocobalamin (VITAMIN B-12 PO) Take 1 tablet by mouth daily. Vitafusion     diclofenac sodium (VOLTAREN) 1 % GEL Apply 2 g topically 2 (two) times daily as needed (to affected sites- for arthritis pain).      doxycycline (VIBRA-TABS) 100 MG tablet Take 1 tablet (100 mg total) by mouth 2 (two) times daily. 14 tablet 0   empagliflozin  (JARDIANCE ) 10 MG TABS tablet Take 1 tablet (10 mg total) by mouth daily. 30 tablet 11   furosemide  (LASIX ) 20 MG tablet TAKE 3 TABLETS(60 MG) BY MOUTH DAILY 90 tablet 0   metoprolol  succinate (TOPROL -XL) 100 MG 24 hr tablet Take 1 tablet (100 mg total) by mouth in the morning AND 0.5 tablets (50 mg total) every evening. Take with or immediately following a meal. 180 tablet 2   Multiple Vitamins-Minerals (MULTIVITAMIN WITH MINERALS) tablet Take 1 tablet by mouth daily. Vitafusion     Multiple Vitamins-Minerals (ZINC PO) Take 1 tablet by mouth daily.     oxyCODONE -acetaminophen  (PERCOCET/ROXICET) 5-325 MG tablet Take 1 tablet by mouth 4 (four) times daily.     Pyridoxine HCl (B-6 PO) Take 1 tablet by mouth daily.     rosuvastatin  (CRESTOR ) 5 MG tablet Take 1 tablet (5 mg total) by mouth daily. 90 tablet 0   sertraline  (ZOLOFT ) 25 MG tablet Take 1 tablet (25 mg total) by mouth daily. 30 tablet 3   spironolactone  (ALDACTONE ) 25 MG tablet Take 1 tablet (25 mg total) by mouth daily. 90 tablet 3   tadalafil (CIALIS) 20 MG tablet Take 20 mg by mouth daily as needed for  erectile dysfunction.     amiodarone  (PACERONE ) 200 MG tablet TAKE 1 TABLET (200MG ) TWICE DAILY UNTIL 01/17/24- THEN REDUCE TO 1 TABLET ONCE DAILY THEREAFTER 60 tablet 3   apixaban  (ELIQUIS ) 5 MG TABS tablet Take 1 tablet (5 mg total) by mouth 2 (two) times daily. 180 tablet 3   sacubitril -valsartan  (ENTRESTO ) 49-51 MG Take 1 tablet by mouth 2 (two) times daily. 60 tablet 5   No current facility-administered medications for this visit.   BP 92/73 (BP Location: Left Arm, Patient Position: Sitting, Cuff Size: Normal)  Pulse (!) 128   Ht 6\' 1"  (1.854 m)   Wt 278 lb 12.8 oz (126.5 kg)   SpO2 96%   BMI 36.78 kg/m  General: NAD Neck: Thick. No JVD, no thyromegaly or thyroid  nodule.  Lungs: Clear to auscultation bilaterally with normal respiratory effort. CV: Nondisplaced PMI.  Heart tachy, regular S1/S2, no S3/S4, no murmur.  No peripheral edema.  No carotid bruit.  Normal pedal pulses.  Abdomen: Soft, nontender, no hepatosplenomegaly, no distention.  Skin: Intact without lesions or rashes.  Neurologic: Alert and oriented x 3.  Psych: Normal affect. Extremities: No clubbing or cyanosis.  HEENT: Normal.   Assessment/Plan: 1. CAD: LHC in 3/21 with occluded mid LAD with collaterals.  Cardiac MRI was not suggestive of LAD territory viability, there was an apical aneurysm but no thrombus.  Patient was managed medically. No chest pain.  - He is on Eliquis  for atrial fibrillation so not on ASA.  - Continue Crestor , check lipids today.  2. Atrial fibrillation/flutter: Paroxysmal, s/p DCCV to NSR in 5/21.  Inappropriate ICD shock for AF/RVR in 1/25.  He wears his CPAP.  Today, he is in rapid atrial flutter.  I am unsure how long this has been present.  - I will arrange for TEE-DCCV next week (he has missed Eliquis  doses).  We discussed risks/benefits and he agrees to procedure.  - Continue Eliquis .  CBC today.  - Increase amiodarone  to 200 mg bid until DCCV, drop to 200 mg daily after DCCV. Check  LFTs and TSH today, he will need a regular eye exam.  - I will discuss atrial fibrillation ablation with Dr. [name].  - He needs to cut back on ETOH.  He says that he will quit completely.  3. Chronic systolic CHF: Ischemic cardiomyopathy.  Echo in 6/21 with EF 20-25%, moderately decreased RV systolic function.  St Jude ICD, not CRT candidate.  CPX in 11/21 with minimal HF limitation. Echo in 12/22 showed EF 35-40%, apical inferior aneurysm.  He is not obviously volume overloaded on exam despite AFL/RVR for uncertain period of time.  NYHA class II.  - Continue Entresto  49/51 bid.  - Continue Lasix  60 mg daily. BMET/BNP today.  - Continue spironolactone  25 mg daily.  - Continue Farxiga  10 mg daily.   - Continue Toprol  XL 100 qam, 50 qpm.    - I will arrange for echo to be done after his DCCV when he is in NSR.  4. Right MCA aneurysm: S/p embolization.  5. OSA: Using CPAP.  6. Depression/anxiety:   - Continue sertraline .  7. Obesity: Will discuss GLP-1 agonist at next appt.  8. Tick bite: Patient had 2 ticks on him recently, 1 was engorged.  He reports on and off headaches.  No rash.  - I will give him doxycycline course.   Followup in 3 wks with APP.    I spent 42 minutes reviewing records, interviewing/examining patient, and managing orders.   Richard Graham 01/12/2024

## 2024-01-12 NOTE — Progress Notes (Signed)
 PCP: [name], DO Cardiology: Dr. [name] HF Cardiology: Dr. [name]  Chief complaint: CHF, atrial fibrillation.   68 y.o. with history of CAD, PAF, and ischemic cardiomyopathy was referred by Dr. [name] for evaluation of CHF.  Patent was admitted to [facility] in 3/21 with CHF and atrial fibrillation/RVR. Echo showed EF 20-25%.  LHC was done, showing occluded mid LAD with collaterals.  Cardiac MRI showed EF 22%, apical aneurysm, no significant LAD territory viability; therefore, patient did not have CABG or PCI. Patient was additionally found to have right MCA aneurysm.  This was treated with embolization in 4/21.  In 5/21, he had DCCV back to NSR.  He has remained in NSR on amiodarone .  Repeat echo in 6/21 showed EF 20-25% with moderately decreased RV systolic function.  St Jude ICD was placed.  Patient has also been diagnosed with OSA and has started CPAP.   CPX 11/21 with no clear HF limitation.   Echo in 12/22 showed EF 35-40%, septal/apical akinesis, inferoapical dyskinesis, mild LV enlargement, RV normal, IVC normal.   He had an ICD shock in 1/25 due to AF/RVR.   Patient returns for followup of CHF.  I have not seen him in over a year but he follows with Dr. [name] for his ICD.  He is in atrial flutter with RVR in 120s today.  He does not feel palpitations.  He has been taking all his meds.  Weight has trended up and he is drinking about 3 [employer] Lemonades/day.  He missed a few days of Eliquis  last week when he went out of town.  No dyspnea walking on flat ground.  No chest pain.  No orthopnea/PND.  No lightheadedness or syncope.  He reports headaches on and off, had 2 ticks taken off him in the last week, 1 was engorged with blood. No rash.   ECG (personally reviewed): Atrial flutter rate 127 with RBBB, old ASMI.   Labs (9/22): LDL 59 Labs (12/22): K 4.2, creatinine 1.09, hgb 16.5.  Labs (11/23): K 4.7, creatinine 1.11  PMH: 1. Cerebral aneurysm: Right MCA aneurysm, embolized in 4/21  by IR.  2. Atrial fibrillation: Paroxysmal.  - DCCV 5/21 - Inappropriate ICD shock in 1/25 for AF/RVR.  3. CAD: LHC (3/21) with totally occluded mid LAD with collaterals.  4. Chronic systolic CHF: Ischemic cardiomyopathy.   - Cardiac MRI (5/21): EF 22%, apical aneurysm, LAD territory scarring without evidence for significant viability. RV EF 31%.  - Echo (6/21): EF 20-25%, moderately decreased RV systolic function.  - St Jude ICD.  - CPX (11/21): peak VO2 17.9 (81% predicted), VE/VCO2 slope 25, RER 1.17 => no clear HF limitation.  - Echo (9/22): EF 35-40% with peri-apical akinesis and apical inferior aneurysm, no LV thrombus noted, mildly decreased RV systolic function, normal IVC.  - Echo (12/22): EF 35-40%, septal/apical akinesis, inferoapical dyskinesis, mild LV enlargement, RV normal, IVC normal 5. Gout 6. OSA: Using CPAP.  7. Osteoarthritis 8. Depression/anxiety  Social History   Socioeconomic History   Marital status: Married    Spouse name: Not on file   Number of children: 0   Years of education: Not on file   Highest education level: Some college, no degree  Occupational History   Not on file  Tobacco Use   Smoking status: Former    Current packs/day: 0.00    Average packs/day: 0.3 packs/day for 3.0 years (0.8 ttl pk-yrs)    Types: Cigarettes    Start date: 08/16/2008  Quit date: 08/17/2011    Years since quitting: 12.4   Smokeless tobacco: Never  Vaping Use   Vaping status: Never Used  Substance and Sexual Activity   Alcohol  use: Yes    Alcohol /week: 2.0 standard drinks of alcohol     Types: 2 Glasses of wine per week   Drug use: No   Sexual activity: Yes  Other Topics Concern   Not on file  Social History Narrative   Not on file   Social Drivers of Health   Financial Resource Strain: Not on file  Food Insecurity: Not on file  Transportation Needs: Not on file  Physical Activity: Not on file  Stress: Not on file  Social Connections: Not on file   Intimate Partner Violence: Not on file   FH: No premature CAD or CHF.   ROS: All systems reviewed and negative except as per HPI.   Current Outpatient Medications  Medication Sig Dispense Refill   Ascorbic Acid (VITAMIN C) 1000 MG tablet Take 1,000 mg by mouth daily. Vitafusion     cephALEXin  (KEFLEX ) 500 MG capsule Take 1 capsule (500 mg total) by mouth 4 (four) times daily for 7 days 28 capsule 0   colchicine 0.6 MG tablet Take 0.6 mg by mouth 2 (two) times daily as needed (for gout flares).      Cyanocobalamin (VITAMIN B-12 PO) Take 1 tablet by mouth daily. Vitafusion     diclofenac sodium (VOLTAREN) 1 % GEL Apply 2 g topically 2 (two) times daily as needed (to affected sites- for arthritis pain).      doxycycline (VIBRA-TABS) 100 MG tablet Take 1 tablet (100 mg total) by mouth 2 (two) times daily. 14 tablet 0   empagliflozin  (JARDIANCE ) 10 MG TABS tablet Take 1 tablet (10 mg total) by mouth daily. 30 tablet 11   furosemide  (LASIX ) 20 MG tablet TAKE 3 TABLETS(60 MG) BY MOUTH DAILY 90 tablet 0   metoprolol  succinate (TOPROL -XL) 100 MG 24 hr tablet Take 1 tablet (100 mg total) by mouth in the morning AND 0.5 tablets (50 mg total) every evening. Take with or immediately following a meal. 180 tablet 2   Multiple Vitamins-Minerals (MULTIVITAMIN WITH MINERALS) tablet Take 1 tablet by mouth daily. Vitafusion     Multiple Vitamins-Minerals (ZINC PO) Take 1 tablet by mouth daily.     oxyCODONE -acetaminophen  (PERCOCET/ROXICET) 5-325 MG tablet Take 1 tablet by mouth 4 (four) times daily.     Pyridoxine HCl (B-6 PO) Take 1 tablet by mouth daily.     rosuvastatin  (CRESTOR ) 5 MG tablet Take 1 tablet (5 mg total) by mouth daily. 90 tablet 0   sertraline  (ZOLOFT ) 25 MG tablet Take 1 tablet (25 mg total) by mouth daily. 30 tablet 3   spironolactone  (ALDACTONE ) 25 MG tablet Take 1 tablet (25 mg total) by mouth daily. 90 tablet 3   tadalafil (CIALIS) 20 MG tablet Take 20 mg by mouth daily as needed for  erectile dysfunction.     amiodarone  (PACERONE ) 200 MG tablet TAKE 1 TABLET (200MG ) TWICE DAILY UNTIL 01/17/24- THEN REDUCE TO 1 TABLET ONCE DAILY THEREAFTER 60 tablet 3   apixaban  (ELIQUIS ) 5 MG TABS tablet Take 1 tablet (5 mg total) by mouth 2 (two) times daily. 180 tablet 3   sacubitril -valsartan  (ENTRESTO ) 49-51 MG Take 1 tablet by mouth 2 (two) times daily. 60 tablet 5   No current facility-administered medications for this visit.   BP 92/73 (BP Location: Left Arm, Patient Position: Sitting, Cuff Size: Normal)  Pulse (!) 128   Ht 6\' 1"  (1.854 m)   Wt 278 lb 12.8 oz (126.5 kg)   SpO2 96%   BMI 36.78 kg/m  General: NAD Neck: Thick. No JVD, no thyromegaly or thyroid  nodule.  Lungs: Clear to auscultation bilaterally with normal respiratory effort. CV: Nondisplaced PMI.  Heart tachy, regular S1/S2, no S3/S4, no murmur.  No peripheral edema.  No carotid bruit.  Normal pedal pulses.  Abdomen: Soft, nontender, no hepatosplenomegaly, no distention.  Skin: Intact without lesions or rashes.  Neurologic: Alert and oriented x 3.  Psych: Normal affect. Extremities: No clubbing or cyanosis.  HEENT: Normal.   Assessment/Plan: 1. CAD: LHC in 3/21 with occluded mid LAD with collaterals.  Cardiac MRI was not suggestive of LAD territory viability, there was an apical aneurysm but no thrombus.  Patient was managed medically. No chest pain.  - He is on Eliquis  for atrial fibrillation so not on ASA.  - Continue Crestor , check lipids today.  2. Atrial fibrillation/flutter: Paroxysmal, s/p DCCV to NSR in 5/21.  Inappropriate ICD shock for AF/RVR in 1/25.  He wears his CPAP.  Today, he is in rapid atrial flutter.  I am unsure how long this has been present.  - I will arrange for TEE-DCCV next week (he has missed Eliquis  doses).  We discussed risks/benefits and he agrees to procedure.  - Continue Eliquis .  CBC today.  - Increase amiodarone  to 200 mg bid until DCCV, drop to 200 mg daily after DCCV. Check  LFTs and TSH today, he will need a regular eye exam.  - I will discuss atrial fibrillation ablation with Dr. [name].  - He needs to cut back on ETOH.  He says that he will quit completely.  3. Chronic systolic CHF: Ischemic cardiomyopathy.  Echo in 6/21 with EF 20-25%, moderately decreased RV systolic function.  St Jude ICD, not CRT candidate.  CPX in 11/21 with minimal HF limitation. Echo in 12/22 showed EF 35-40%, apical inferior aneurysm.  He is not obviously volume overloaded on exam despite AFL/RVR for uncertain period of time.  NYHA class II.  - Continue Entresto  49/51 bid.  - Continue Lasix  60 mg daily. BMET/BNP today.  - Continue spironolactone  25 mg daily.  - Continue Farxiga  10 mg daily.   - Continue Toprol  XL 100 qam, 50 qpm.    - I will arrange for echo to be done after his DCCV when he is in NSR.  4. Right MCA aneurysm: S/p embolization.  5. OSA: Using CPAP.  6. Depression/anxiety:   - Continue sertraline .  7. Obesity: Will discuss GLP-1 agonist at next appt.  8. Tick bite: Patient had 2 ticks on him recently, 1 was engorged.  He reports on and off headaches.  No rash.  - I will give him doxycycline course.   Followup in 3 wks with APP.    I spent 42 minutes reviewing records, interviewing/examining patient, and managing orders.   Richard Graham 01/12/2024

## 2024-01-13 ENCOUNTER — Other Ambulatory Visit: Payer: Self-pay

## 2024-01-13 ENCOUNTER — Ambulatory Visit (HOSPITAL_COMMUNITY): Payer: Self-pay | Admitting: Cardiology

## 2024-01-13 ENCOUNTER — Other Ambulatory Visit (HOSPITAL_COMMUNITY): Payer: Self-pay

## 2024-01-13 DIAGNOSIS — I4892 Unspecified atrial flutter: Secondary | ICD-10-CM

## 2024-01-13 LAB — COMPREHENSIVE METABOLIC PANEL WITH GFR
ALT: 17 IU/L (ref 0–44)
AST: 18 IU/L (ref 0–40)
Albumin: 4.8 g/dL (ref 3.9–4.9)
Alkaline Phosphatase: 70 IU/L (ref 44–121)
BUN/Creatinine Ratio: 22 (ref 10–24)
BUN: 26 mg/dL (ref 8–27)
Bilirubin Total: 0.6 mg/dL (ref 0.0–1.2)
CO2: 22 mmol/L (ref 20–29)
Calcium: 10 mg/dL (ref 8.6–10.2)
Chloride: 94 mmol/L — ABNORMAL LOW (ref 96–106)
Creatinine, Ser: 1.16 mg/dL (ref 0.76–1.27)
Globulin, Total: 2.5 g/dL (ref 1.5–4.5)
Glucose: 105 mg/dL — ABNORMAL HIGH (ref 70–99)
Potassium: 5.3 mmol/L — ABNORMAL HIGH (ref 3.5–5.2)
Sodium: 135 mmol/L (ref 134–144)
Total Protein: 7.3 g/dL (ref 6.0–8.5)
eGFR: 70 mL/min/{1.73_m2} (ref >59)

## 2024-01-13 LAB — LIPID PANEL
Chol/HDL Ratio: 3.3 ratio (ref 0.0–5.0)
Cholesterol, Total: 137 mg/dL (ref 100–199)
HDL: 41 mg/dL (ref >39)
LDL Chol Calc (NIH): 67 mg/dL (ref 0–99)
Triglycerides: 174 mg/dL — ABNORMAL HIGH (ref 0–149)
VLDL Cholesterol Cal: 29 mg/dL (ref 5–40)

## 2024-01-13 LAB — CBC
Hematocrit: 53.3 % — ABNORMAL HIGH (ref 37.5–51.0)
Hemoglobin: 17.2 g/dL (ref 13.0–17.7)
MCH: 31 pg (ref 26.6–33.0)
MCHC: 32.3 g/dL (ref 31.5–35.7)
MCV: 96 fL (ref 79–97)
Platelets: 237 10*3/uL (ref 150–450)
RBC: 5.54 x10E6/uL (ref 4.14–5.80)
RDW: 14.1 % (ref 11.6–15.4)
WBC: 10.3 10*3/uL (ref 3.4–10.8)

## 2024-01-13 LAB — BRAIN NATRIURETIC PEPTIDE: BNP: 90.2 pg/mL (ref 0.0–100.0)

## 2024-01-13 LAB — TSH: TSH: 1.99 u[IU]/mL (ref 0.450–4.500)

## 2024-01-13 MED ORDER — FUROSEMIDE 20 MG PO TABS
60.0000 mg | ORAL_TABLET | Freq: Every day | ORAL | 3 refills | Status: DC
  Filled 2024-01-13 – 2024-01-26 (×4): qty 90, 30d supply, fill #0

## 2024-01-13 NOTE — Addendum Note (Signed)
 Addended by: Tyra Galley B on: 01/13/2024 12:34 PM   Modules accepted: Orders

## 2024-01-14 ENCOUNTER — Other Ambulatory Visit (HOSPITAL_COMMUNITY): Payer: Self-pay

## 2024-01-16 ENCOUNTER — Other Ambulatory Visit (HOSPITAL_COMMUNITY): Payer: Self-pay

## 2024-01-17 ENCOUNTER — Ambulatory Visit (HOSPITAL_COMMUNITY)
Admission: RE | Admit: 2024-01-17 | Discharge: 2024-01-17 | Disposition: A | Discharge status: Home / Self Care | Attending: Cardiology | Admitting: Cardiology

## 2024-01-17 ENCOUNTER — Ambulatory Visit (HOSPITAL_COMMUNITY): Admitting: Anesthesiology

## 2024-01-17 ENCOUNTER — Encounter (HOSPITAL_COMMUNITY)
Admission: RE | Disposition: A | Discharge status: Home / Self Care | Payer: Self-pay | Source: Home / Self Care | Attending: Cardiology

## 2024-01-17 ENCOUNTER — Other Ambulatory Visit (HOSPITAL_COMMUNITY): Payer: Self-pay | Admitting: Cardiology

## 2024-01-17 ENCOUNTER — Other Ambulatory Visit: Payer: Self-pay

## 2024-01-17 ENCOUNTER — Encounter (HOSPITAL_COMMUNITY): Payer: Self-pay | Admitting: Cardiology

## 2024-01-17 ENCOUNTER — Ambulatory Visit (HOSPITAL_COMMUNITY)
Admission: RE | Admit: 2024-01-17 | Discharge: 2024-01-17 | Disposition: A | Discharge status: Home / Self Care | Source: Ambulatory Visit | Attending: Cardiology | Admitting: Cardiology

## 2024-01-17 ENCOUNTER — Other Ambulatory Visit (HOSPITAL_COMMUNITY): Payer: Self-pay

## 2024-01-17 DIAGNOSIS — I110 Hypertensive heart disease with heart failure: Secondary | ICD-10-CM | POA: Insufficient documentation

## 2024-01-17 DIAGNOSIS — Z95810 Presence of automatic (implantable) cardiac defibrillator: Secondary | ICD-10-CM | POA: Insufficient documentation

## 2024-01-17 DIAGNOSIS — I5022 Chronic systolic (congestive) heart failure: Secondary | ICD-10-CM

## 2024-01-17 DIAGNOSIS — G4733 Obstructive sleep apnea (adult) (pediatric): Secondary | ICD-10-CM | POA: Insufficient documentation

## 2024-01-17 DIAGNOSIS — I5023 Acute on chronic systolic (congestive) heart failure: Secondary | ICD-10-CM | POA: Diagnosis not present

## 2024-01-17 DIAGNOSIS — F419 Anxiety disorder, unspecified: Secondary | ICD-10-CM | POA: Insufficient documentation

## 2024-01-17 DIAGNOSIS — Z7984 Long term (current) use of oral hypoglycemic drugs: Secondary | ICD-10-CM | POA: Insufficient documentation

## 2024-01-17 DIAGNOSIS — I2510 Atherosclerotic heart disease of native coronary artery without angina pectoris: Secondary | ICD-10-CM | POA: Insufficient documentation

## 2024-01-17 DIAGNOSIS — I361 Nonrheumatic tricuspid (valve) insufficiency: Secondary | ICD-10-CM

## 2024-01-17 DIAGNOSIS — I4892 Unspecified atrial flutter: Secondary | ICD-10-CM

## 2024-01-17 DIAGNOSIS — I480 Paroxysmal atrial fibrillation: Secondary | ICD-10-CM | POA: Diagnosis not present

## 2024-01-17 DIAGNOSIS — I071 Rheumatic tricuspid insufficiency: Secondary | ICD-10-CM | POA: Insufficient documentation

## 2024-01-17 DIAGNOSIS — E785 Hyperlipidemia, unspecified: Secondary | ICD-10-CM | POA: Insufficient documentation

## 2024-01-17 DIAGNOSIS — Z87891 Personal history of nicotine dependence: Secondary | ICD-10-CM | POA: Insufficient documentation

## 2024-01-17 DIAGNOSIS — I441 Atrioventricular block, second degree: Secondary | ICD-10-CM | POA: Insufficient documentation

## 2024-01-17 DIAGNOSIS — Z7982 Long term (current) use of aspirin: Secondary | ICD-10-CM | POA: Insufficient documentation

## 2024-01-17 DIAGNOSIS — I671 Cerebral aneurysm, nonruptured: Secondary | ICD-10-CM | POA: Diagnosis not present

## 2024-01-17 DIAGNOSIS — Z7901 Long term (current) use of anticoagulants: Secondary | ICD-10-CM | POA: Diagnosis not present

## 2024-01-17 DIAGNOSIS — Z79899 Other long term (current) drug therapy: Secondary | ICD-10-CM | POA: Diagnosis not present

## 2024-01-17 DIAGNOSIS — F32A Depression, unspecified: Secondary | ICD-10-CM | POA: Insufficient documentation

## 2024-01-17 DIAGNOSIS — J449 Chronic obstructive pulmonary disease, unspecified: Secondary | ICD-10-CM | POA: Insufficient documentation

## 2024-01-17 HISTORY — PX: TRANSESOPHAGEAL ECHOCARDIOGRAM (CATH LAB): EP1270

## 2024-01-17 HISTORY — PX: CARDIOVERSION: EP1203

## 2024-01-17 LAB — ECHO TEE

## 2024-01-17 SURGERY — TRANSESOPHAGEAL ECHOCARDIOGRAM (TEE) (CATHLAB)
Anesthesia: General

## 2024-01-17 MED ORDER — SODIUM CHLORIDE 0.9 % IV SOLN: INTRAVENOUS | Status: DC

## 2024-01-17 MED ORDER — LIDOCAINE 2% (20 MG/ML) 5 ML SYRINGE
INTRAMUSCULAR | Status: DC | PRN
  Administered 2024-01-17: 50 mg via INTRAVENOUS

## 2024-01-17 MED ORDER — PHENYLEPHRINE HCL-NACL 20-0.9 MG/250ML-% IV SOLN
INTRAVENOUS | Status: DC | PRN
Start: 2024-01-17 — End: 2024-01-17
  Administered 2024-01-17: 20 ug/min via INTRAVENOUS

## 2024-01-17 MED ORDER — PHENYLEPHRINE HCL (PRESSORS) 10 MG/ML IV SOLN
INTRAVENOUS | Status: DC | PRN
  Administered 2024-01-17 (×2): 80 ug via INTRAVENOUS
  Administered 2024-01-17: 160 ug via INTRAVENOUS

## 2024-01-17 MED ORDER — ETOMIDATE 2 MG/ML IV SOLN
INTRAVENOUS | Status: DC | PRN
  Administered 2024-01-17: 4 mg via INTRAVENOUS

## 2024-01-17 MED ORDER — PROPOFOL 10 MG/ML IV BOLUS
INTRAVENOUS | Status: DC | PRN
  Administered 2024-01-17: 100 ug/kg/min via INTRAVENOUS

## 2024-01-17 SURGICAL SUPPLY — 1 items: PAD DEFIB RADIO PHYSIO CONN (PAD) ×1 IMPLANT

## 2024-01-17 NOTE — Anesthesia Preprocedure Evaluation (Signed)
 Anesthesia Evaluation  Patient identified by MRN, date of birth, ID band Patient awake    Reviewed: Allergy & Precautions, NPO status , Patient's Chart, lab work & pertinent test results  Airway Mallampati: II  TM Distance: >3 FB Neck ROM: Full    Dental  (+) Teeth Intact, Dental Advisory Given   Pulmonary sleep apnea , former smoker   Pulmonary exam normal breath sounds clear to auscultation       Cardiovascular hypertension, + CAD and +CHF  Normal cardiovascular exam+ pacemaker + Cardiac Defibrillator  Rhythm:Regular Rate:Normal     Neuro/Psych Cerebral aneurysm s/p embolization    GI/Hepatic negative GI ROS, Neg liver ROS,,,  Endo/Other  negative endocrine ROSdiabetes    Renal/GU negative Renal ROS     Musculoskeletal negative musculoskeletal ROS (+)    Abdominal   Peds  Hematology negative hematology ROS (+)   Anesthesia Other Findings Day of surgery medications reviewed with the patient.  Reproductive/Obstetrics                              Anesthesia Physical Anesthesia Plan  ASA: 4  Anesthesia Plan: General   Post-op Pain Management: Minimal or no pain anticipated   Induction: Intravenous  PONV Risk Score and Plan: 2 and TIVA  Airway Management Planned: Natural Airway and Simple Face Mask  Additional Equipment:   Intra-op Plan:   Post-operative Plan:   Informed Consent: I have reviewed the patients History and Physical, chart, labs and discussed the procedure including the risks, benefits and alternatives for the proposed anesthesia with the patient or authorized representative who has indicated his/her understanding and acceptance.     Dental advisory given  Plan Discussed with: CRNA  Anesthesia Plan Comments:          Anesthesia Quick Evaluation

## 2024-01-17 NOTE — Interval H&P Note (Signed)
 History and Physical Interval Note:  01/17/2024 8:38 AM  Richard Graham  has presented today for surgery, with the diagnosis of AFLUTTER.  The various methods of treatment have been discussed with the patient and family. After consideration of risks, benefits and other options for treatment, the patient has consented to  Procedure(s): TRANSESOPHAGEAL ECHOCARDIOGRAM (N/A) CARDIOVERSION (N/A) as a surgical intervention.  The patient's history has been reviewed, patient examined, no change in status, stable for surgery.  I have reviewed the patient's chart and labs.  Questions were answered to the patient's satisfaction.     [name] [employer]

## 2024-01-17 NOTE — Discharge Instructions (Addendum)
 Electrical Cardioversion Electrical cardioversion is the delivery of a jolt of electricity to restore a normal rhythm to the heart. A rhythm that is too fast or is not regular keeps the heart from pumping well. In this procedure, sticky patches or metal paddles are placed on the chest to deliver electricity to the heart from a device. This procedure may be done in an emergency if: There is low or no blood pressure as a result of the heart rhythm. Normal rhythm must be restored as fast as possible to protect the brain and heart from further damage. It may save a life. This may also be a scheduled procedure for irregular or fast heart rhythms that are not immediately life-threatening.  What can I expect after the procedure? Your blood pressure, heart rate, breathing rate, and blood oxygen level will be monitored until you leave the hospital or clinic. Your heart rhythm will be watched to make sure it does not change. You may have some redness on the skin where the shocks were given. Over the counter cortizone cream may be helpful.  Follow these instructions at home: Do not drive for 24 hours if you were given a sedative during your procedure. Take over-the-counter and prescription medicines only as told by your health care provider. Ask your health care provider how to check your pulse. Check it often. Rest for 48 hours after the procedure or as told by your health care provider. Avoid or limit your caffeine use as told by your health care provider. Keep all follow-up visits as told by your health care provider. This is important. Contact a health care provider if: You feel like your heart is beating too quickly or your pulse is not regular. You have a serious muscle cramp that does not go away. Get help right away if: You have discomfort in your chest. You are dizzy or you feel faint. You have trouble breathing or you are short of breath. Your speech is slurred. You have trouble moving an  arm or leg on one side of your body. Your fingers or toes turn cold or blue. Summary Electrical cardioversion is the delivery of a jolt of electricity to restore a normal rhythm to the heart. This procedure may be done right away in an emergency or may be a scheduled procedure if the condition is not an emergency. Generally, this is a safe procedure. After the procedure, check your pulse often as told by your health care provider. This information is not intended to replace advice given to you by your health care provider. Make sure you discuss any questions you have with your health care provider. Document Revised: 03/05/2019 Document Reviewed: 03/05/2019 Elsevier Patient Education  2020 [employer]. Writer cardioversion is the delivery of a jolt of electricity to restore a normal rhythm to the heart. A rhythm that is too fast or is not regular keeps the heart from pumping well. In this procedure, sticky patches or metal paddles are placed on the chest to deliver electricity to the heart from a device. This procedure may be done in an emergency if: There is low or no blood pressure as a result of the heart rhythm. Normal rhythm must be restored as fast as possible to protect the brain and heart from further damage. It may save a life. This may also be a scheduled procedure for irregular or fast heart rhythms that are not immediately life-threatening.  What can I expect after the procedure? Your blood pressure, heart rate,  breathing rate, and blood oxygen level will be monitored until you leave the hospital or clinic. Your heart rhythm will be watched to make sure it does not change. You may have some redness on the skin where the shocks were given. Over the counter cortizone cream may be helpful.  Follow these instructions at home: Do not drive for 24 hours if you were given a sedative during your procedure. Take over-the-counter and prescription medicines only  as told by your health care provider. Ask your health care provider how to check your pulse. Check it often. Rest for 48 hours after the procedure or as told by your health care provider. Avoid or limit your caffeine use as told by your health care provider. Keep all follow-up visits as told by your health care provider. This is important. Contact a health care provider if: You feel like your heart is beating too quickly or your pulse is not regular. You have a serious muscle cramp that does not go away. Get help right away if: You have discomfort in your chest. You are dizzy or you feel faint. You have trouble breathing or you are short of breath. Your speech is slurred. You have trouble moving an arm or leg on one side of your body. Your fingers or toes turn cold or blue. Summary Electrical cardioversion is the delivery of a jolt of electricity to restore a normal rhythm to the heart. This procedure may be done right away in an emergency or may be a scheduled procedure if the condition is not an emergency. Generally, this is a safe procedure. After the procedure, check your pulse often as told by your health care provider. This information is not intended to replace advice given to you by your health care provider. Make sure you discuss any questions you have with your health care provider. Document Revised: 03/05/2019 Document Reviewed: 03/05/2019 Elsevier Patient Education  2020 [employer] TEE  YOU HAD AN CARDIAC PROCEDURE TODAY: Refer to the procedure report and other information in the discharge instructions given to you for any specific questions about what was found during the examination. If this information does not answer your questions, please call [facility] office at [phone] to clarify.   DIET: Your first meal following the procedure should be a light meal and then it is ok to progress to your normal diet. A half-sandwich or bowl of soup is an example of a good  first meal. Heavy or fried foods are harder to digest and may make you feel nauseous or bloated. Drink plenty of fluids but you should avoid alcoholic beverages for 24 hours. If you had a esophageal dilation, please see attached instructions for diet.   ACTIVITY: Your care partner should take you home directly after the procedure. You should plan to take it easy, moving slowly for the rest of the day. You can resume normal activity the day after the procedure however YOU SHOULD NOT DRIVE, use power tools, machinery or perform tasks that involve climbing or major physical exertion for 24 hours (because of the sedation medicines used during the test).   SYMPTOMS TO REPORT IMMEDIATELY: A cardiologist can be reached at any hour. Please call [phone] for any of the following symptoms:  Vomiting of blood or coffee ground material  New, significant abdominal pain  New, significant chest pain or pain under the shoulder blades  Painful or persistently difficult swallowing  New shortness of breath  Black, tarry-looking or red, bloody stools  FOLLOW UP:  Please also call with any specific questions about appointments or follow up tests.

## 2024-01-17 NOTE — Transfer of Care (Signed)
 Immediate Anesthesia Transfer of Care Note  Patient: Richard Graham  Procedure(s) Performed: TRANSESOPHAGEAL ECHOCARDIOGRAM CARDIOVERSION  Patient Location: Cath Lab  Anesthesia Type:MAC  Level of Consciousness: awake, alert , oriented, and patient cooperative  Airway & Oxygen Therapy: Patient Spontanous Breathing and Patient connected to face mask oxygen  Post-op Assessment: Report given to RN, Post -op Vital signs reviewed and stable, Patient moving all extremities, Patient moving all extremities X 4, and Patient able to stick tongue midline  Post vital signs: Reviewed and stable  Last Vitals:  Vitals Value Taken Time  BP 85/56 01/17/24 0916  Temp 36.5 C 01/17/24 0916  Pulse 66 01/17/24 0920  Resp 16 01/17/24 0920  SpO2 98 % 01/17/24 0920  Vitals shown include unfiled device data.  Last Pain:  Vitals:   01/17/24 0916  TempSrc: Tympanic  PainSc: 0-No pain         Complications: No notable events documented.

## 2024-01-17 NOTE — CV Procedure (Signed)
 Procedure: TEE  Sedation: Per anesthesiology  Indication: atrial flutter  Findings: Please see echo section for full report.  Normal LV size with peri-apical akinesis, LV EF 30-35%.  Normal RV size and systolic function.  ICD in RV.  Mild-moderate tricuspid regurgitation, peak RV-RA gradient 16 mmHg.  Normal right atrial size.  Mild left atrial enlargement, no LA appendage thrombus.  No ASD or PFO by color doppler.  Trivial MR.  Trileaflet aortic valve, no significant stenosis or regurgitation.  Normal caliber thoracic aorta without significant plaque noted.  May proceed to DCCV.   Peder Bourdon 01/17/2024 9:08 AM

## 2024-01-17 NOTE — Procedures (Signed)
 Electrical Cardioversion Procedure Note Richard Graham [id number] [birth year]  Procedure: Electrical Cardioversion Indications:  Atrial Flutter  Procedure Details Consent: Risks of procedure as well as the alternatives and risks of each were explained to the (patient/caregiver).  Consent for procedure obtained. Time Out: Verified patient identification, verified procedure, site/side was marked, verified correct patient position, special equipment/implants available, medications/allergies/relevent history reviewed, required imaging and test results available.  Performed  Patient placed on cardiac monitor, pulse oximetry, supplemental oxygen as necessary.  Sedation given: Propofol  per anesthesiology Pacer pads placed anterior and posterior chest.  Cardioverted 1 time(s).  Cardioverted at 200J.  Evaluation Findings: Post procedure EKG shows: NSR Complications: None Patient did tolerate procedure well.   Peder Bourdon 01/17/2024, 9:08 AM

## 2024-01-17 NOTE — Anesthesia Postprocedure Evaluation (Signed)
 Anesthesia Post Note  Patient: Richard Graham  Procedure(s) Performed: TRANSESOPHAGEAL ECHOCARDIOGRAM CARDIOVERSION     Patient location during evaluation: Cath Lab Anesthesia Type: General Level of consciousness: awake and alert Pain management: pain level controlled Vital Signs Assessment: post-procedure vital signs reviewed and stable Respiratory status: spontaneous breathing, nonlabored ventilation and respiratory function stable Cardiovascular status: blood pressure returned to baseline and stable Postop Assessment: no apparent nausea or vomiting Anesthetic complications: no   No notable events documented.  Last Vitals:  Vitals:   01/17/24 0936 01/17/24 0946  BP: 96/69 (!) 89/66  Pulse: 67 70  Resp: 18 17  Temp:    SpO2: 96% 98%    Last Pain:  Vitals:   01/17/24 0946  TempSrc:   PainSc: 0-No pain                 Erin Havers

## 2024-01-19 ENCOUNTER — Other Ambulatory Visit (HOSPITAL_COMMUNITY): Payer: Self-pay

## 2024-01-19 ENCOUNTER — Other Ambulatory Visit: Payer: Self-pay

## 2024-01-19 MED ORDER — METOPROLOL SUCCINATE ER 100 MG PO TB24
ORAL_TABLET | ORAL | 2 refills | Status: DC
  Filled 2024-01-19: qty 135, 90d supply, fill #0

## 2024-01-26 ENCOUNTER — Other Ambulatory Visit (HOSPITAL_COMMUNITY): Payer: Self-pay | Admitting: Cardiology

## 2024-01-26 ENCOUNTER — Other Ambulatory Visit (HOSPITAL_COMMUNITY): Payer: Self-pay

## 2024-01-26 ENCOUNTER — Other Ambulatory Visit: Payer: Self-pay

## 2024-01-26 NOTE — Progress Notes (Signed)
 Remote ICD transmission.

## 2024-01-26 NOTE — Addendum Note (Signed)
 Addended by: Lott Rouleau A on: 01/26/2024 10:02 AM   Modules accepted: Orders

## 2024-01-30 ENCOUNTER — Telehealth (HOSPITAL_COMMUNITY): Payer: Self-pay

## 2024-01-30 NOTE — Telephone Encounter (Signed)
 Called to confirm/remind patient of their appointment at the [facility] on 01/31/24.   Appointment:   [x] Confirmed  [] Left mess   [] No answer/No voice mail  [] VM Full/unable to leave message  [] Phone not in service  Patient reminded to bring all medications and/or complete list.  Confirmed patient has transportation. Gave directions, instructed to utilize valet parking.

## 2024-01-30 NOTE — Progress Notes (Signed)
 [facility] Progress Note   PCP: [name], DO Cardiology: Dr. [name] HF Cardiology: Dr. [name]  Chief complaint: f/u for Persistent Afib s/p DCCV; f/u for heart failure   68 y.o. with history of CAD, PAF, and ischemic cardiomyopathy was referred by Dr. [name] for evaluation of CHF.  Patent was admitted to [facility] in 3/21 with CHF and atrial fibrillation/RVR. Echo showed EF 20-25%.  LHC was done, showing occluded mid LAD with collaterals.  Cardiac MRI showed EF 22%, apical aneurysm, no significant LAD territory viability; therefore, patient did not have CABG or PCI. Patient was additionally found to have right MCA aneurysm.  This was treated with embolization in 4/21.  In 5/21, he had DCCV back to NSR.  He has remained in NSR on amiodarone .  Repeat echo in 6/21 showed EF 20-25% with moderately decreased RV systolic function.  St Jude ICD was placed.  Patient has also been diagnosed with OSA and has started CPAP.   CPX 11/21 with no clear HF limitation.   Echo in 12/22 showed EF 35-40%, septal/apical akinesis, inferoapical dyskinesis, mild LV enlargement, RV normal, IVC normal.   He had an ICD shock in 1/25 due to AF/RVR.   Pt seen recently for r/u on 01/12/24. He was in atrial flutter with RVR in 120s but asymptomatic. He has been taking all his meds but did miss a few doses of Eliquis .  Labs showed normal BNP (90) but mildly elevated K at 5.3. He was instructed to stop KCl and limit K rich foods. He was also found to have 2 ticks on him. No rash.They were removed. He was started on course of doxycyline.   He was set up for outpatient TEE/DCCV. This was done on 6/3. TEE LV EF 30-35%. Normal RV size and systolic function, Mild-moderate tricuspid regurgitation, normal right atrial size. Mild left atrial enlargement, no LA appendage thrombus. No ASD or PFO by color doppler. Trivial MR. Trileaflet aortic valve, no significant stenosis or regurgitation. Normal caliber thoracic aorta  without significant plaque noted. Underwent successful DCCV x 1 back to NSR.   He presents today for post procedural f/u. EKG today shows he is back in AFL w/ RVR 122 bpm. Asymptomatic. Unaware he reverted back. SBPs upper 90s but denies dizziness. No resting dyspnea. Reports stable NYHA class II-early III symptoms. Wt up 2 lb since previous visit. No LEE. Compliant w/ meds. Currently on amio 200 mg once daily. Has cut out ETOH and energy drinks but still w/ high caffeine intake. Drinks espresso daily, + several glasses of tea and consumes dark chocolate daily.  Compliant w/ CPAP.   ECG (personally reviewed): AFL 122 bpm   Labs (9/22): LDL 59 Labs (12/22): K 4.2, creatinine 1.09, hgb 16.5.  Labs (11/23): K 4.7, creatinine 1.11 Labs (5/25): K 5.3, creatinine 1.16, Hgb 17, TSH nl, BNP 90, TG 174, LDL 67   PMH: 1. Cerebral aneurysm: Right MCA aneurysm, embolized in 4/21 by IR.  2. Atrial fibrillation: Paroxysmal.  - DCCV 5/21 - Inappropriate ICD shock in 1/25 for AF/RVR.  3. CAD: LHC (3/21) with totally occluded mid LAD with collaterals.  4. Chronic systolic CHF: Ischemic cardiomyopathy.   - Cardiac MRI (5/21): EF 22%, apical aneurysm, LAD territory scarring without evidence for significant viability. RV EF 31%.  - Echo (6/21): EF 20-25%, moderately decreased RV systolic function.  - St Jude ICD.  - CPX (11/21): peak VO2 17.9 (81% predicted), VE/VCO2 slope 25, RER 1.17 => no clear HF  limitation.  - Echo (9/22): EF 35-40% with peri-apical akinesis and apical inferior aneurysm, no LV thrombus noted, mildly decreased RV systolic function, normal IVC.  - Echo (12/22): EF 35-40%, septal/apical akinesis, inferoapical dyskinesis, mild LV enlargement, RV normal, IVC normal 5. Gout 6. OSA: Using CPAP.  7. Osteoarthritis 8. Depression/anxiety  Social History   Socioeconomic History   Marital status: Married    Spouse name: Not on file   Number of children: 0   Years of education: Not on file    Highest education level: Some college, no degree  Occupational History   Not on file  Tobacco Use   Smoking status: Former    Current packs/day: 0.00    Average packs/day: 0.3 packs/day for 3.0 years (0.8 ttl pk-yrs)    Types: Cigarettes    Start date: 08/16/2008    Quit date: 08/17/2011    Years since quitting: 12.4   Smokeless tobacco: Never  Vaping Use   Vaping status: Never Used  Substance and Sexual Activity   Alcohol  use: Yes    Alcohol /week: 2.0 standard drinks of alcohol     Types: 2 Glasses of wine per week   Drug use: No   Sexual activity: Yes  Other Topics Concern   Not on file  Social History Narrative   Not on file   Social Drivers of Health   Financial Resource Strain: Not on file  Food Insecurity: Not on file  Transportation Needs: Not on file  Physical Activity: Not on file  Stress: Not on file  Social Connections: Not on file  Intimate Partner Violence: Not on file   FH: No premature CAD or CHF.   ROS: All systems reviewed and negative except as per HPI.   Current Outpatient Medications  Medication Sig Dispense Refill   amiodarone  (PACERONE ) 200 MG tablet TAKE 1 TABLET (200MG ) TWICE DAILY UNTIL 01/17/24- THEN REDUCE TO 1 TABLET ONCE DAILY THEREAFTER 60 tablet 3   apixaban  (ELIQUIS ) 5 MG TABS tablet Take 1 tablet (5 mg total) by mouth 2 (two) times daily. 180 tablet 3   ascorbic acid (VITAMIN C) 500 MG tablet Take 500 mg by mouth daily.     colchicine 0.6 MG tablet Take 0.6 mg by mouth 2 (two) times daily as needed (for gout flares).      Cyanocobalamin (VITAMIN B-12 PO) Take 1 tablet by mouth daily. Vitafusion     diclofenac sodium (VOLTAREN) 1 % GEL Apply 2 g topically 2 (two) times daily as needed (to affected sites- for arthritis pain).      doxycycline  (VIBRA -TABS) 100 MG tablet Take 1 tablet (100 mg total) by mouth 2 (two) times daily. 14 tablet 0   empagliflozin  (JARDIANCE ) 10 MG TABS tablet Take 1 tablet (10 mg total) by mouth daily. 30 tablet 11    furosemide  (LASIX ) 20 MG tablet TAKE 3 TABLETS(60 MG) BY MOUTH DAILY 90 tablet 3   guaiFENesin (MUCINEX) 600 MG 12 hr tablet Take 600-1,200 mg by mouth 2 (two) times daily as needed (congestion).     metoprolol  succinate (TOPROL -XL) 100 MG 24 hr tablet Take 1 tablet (100 mg total) by mouth in the morning AND 0.5 tablets (50 mg total) every evening. Take with or immediately following a meal. 180 tablet 2   Multiple Vitamins-Minerals (MULTIVITAMIN WITH MINERALS) tablet Take 1 tablet by mouth daily. Vitafusion     Multiple Vitamins-Minerals (ZINC PO) Take 1 tablet by mouth daily.     mupirocin ointment (BACTROBAN) 2 % Apply 1  Application topically 2 (two) times daily as needed (wound care).     oxyCODONE -acetaminophen  (PERCOCET/ROXICET) 5-325 MG tablet Take 1 tablet by mouth 4 (four) times daily.     rosuvastatin  (CRESTOR ) 5 MG tablet Take 1 tablet (5 mg total) by mouth daily. 90 tablet 0   sacubitril -valsartan  (ENTRESTO ) 49-51 MG Take 1 tablet by mouth 2 (two) times daily. 60 tablet 5   spironolactone  (ALDACTONE ) 25 MG tablet Take 1 tablet (25 mg total) by mouth daily. 90 tablet 3   tadalafil (CIALIS) 20 MG tablet Take 20 mg by mouth daily as needed for erectile dysfunction.     No current facility-administered medications for this encounter.   BP 96/74   Pulse (!) 122   Ht 6' 1 (1.854 m)   Wt 127.4 kg (280 lb 12.8 oz)   SpO2 96%   BMI 37.05 kg/m  PHYSICAL EXAM: General:  Well appearing, obese. No respiratory difficulty HEENT: normal Neck: supple. JVD not elevated. Carotids 2+ bilat; no bruits. No lymphadenopathy or thyromegaly appreciated. Cor: PMI nondisplaced. Irregularly irregular rhythm and rate  Abdomen: obese, soft, nontender, nondistended. Extremities: no cyanosis, clubbing, rash, edema Neuro: alert & oriented x 3, cranial nerves grossly intact. moves all 4 extremities w/o difficulty. Affect pleasant.  Wt Readings from Last 3 Encounters:  01/31/24 127.4 kg (280 lb 12.8 oz)   01/12/24 126.5 kg (278 lb 12.8 oz)  02/28/23 123.8 kg (273 lb)      Assessment/Plan: 1. CAD: LHC in 3/21 with occluded mid LAD with collaterals.  Cardiac MRI was not suggestive of LAD territory viability, there was an apical aneurysm but no thrombus.  Patient was managed medically. Stable w/o CP   - continue medical therapy  - he is not on ASA given need for Eliquis   - Continue Crestor  5 mg daily. Lipid panel 5/25 w/ LDL at goal at 67 mg/dL. HFT nl  2. Atrial fibrillation/flutter: Paroxysmal, s/p DCCV to NSR in 5/21.  Inappropriate ICD shock for AF/RVR in 1/25.  Was in AFL of unknown duration last visit 5/25. S/p DCCV on 01/17/24.  EKG today shows he is back in AFL w/ RVR 122 bpm. Asymptomatic  - increase Amio to 400 mg bid x 3 days>>200 mg bid thereafter - work into Afib clinic later this next wk. If still in afib, may need to consider another AAD option. ? Tikosyn  - he is not thrilled about possible AFL ablation. He would like to avoid if possible. He has been referred back to Dr. [name] to further discuss but appt not made yet  - Continue Eliquis  5 mg bid.   - Amio labs recently checked. TFTs and HFTs checked 5/25 and WNL. He will need a regular eye exams.  - continue to refrain from ETOH  - he needs to significantly cut down on caffeine. Discussed in detail today  - Continue CPAP for OSA  3. Chronic systolic CHF: Ischemic cardiomyopathy.  Echo in 6/21 with EF 20-25%, moderately decreased RV systolic function.  St Jude ICD, not CRT candidate.  CPX in 11/21 with minimal HF limitation. Echo in 12/22 showed EF 35-40%, apical inferior aneurysm.  TEE 6/25 EF 30-35%, RV normal. NYHA Class II-early III. Volume ok, no peripheral edema on exam or significant JVD visible but full assessment difficult due to body habitus.  He is at risk for volume accumulation w/ rapid afib. Imperative to get back in NSR (see above).  - Will check BNP level today. Pt advised to monitor closely for  development of  peripheral edema/ wt gain and will notify us  if any worsening HF symptoms   - Continue Entresto  49/51 bid. No room for up titration  - Continue Lasix  40 mg daily  - Continue spironolactone  25 mg daily.  - Continue Farxiga  10 mg daily.   - Continue Toprol  XL 100 qam, 50 qpm.    - Check BMP today  4. Right MCA aneurysm: S/p embolization.  5. OSA: reports compliance w/ CPAP.  - Continue nightly CPAP use.  - refer for GLP1  6. Depression/anxiety:   - Continue sertraline . Managed by PCP  7. Obesity: Body mass index is 37.05 kg/m. - discussed GLP1. He is interested. Will refer to pharmD clinic   8. Tick bite: completed doxycycline  course.   F/u in Afib clinic later this wk. F/u [facility] in 2 wks. Pt instructed to notify if any worsening HF symptoms in the interim.      Ruddy Corral, PA-C  01/31/2024

## 2024-01-31 ENCOUNTER — Encounter (HOSPITAL_COMMUNITY): Payer: Self-pay

## 2024-01-31 ENCOUNTER — Ambulatory Visit (HOSPITAL_COMMUNITY): Payer: Self-pay | Admitting: Cardiology

## 2024-01-31 ENCOUNTER — Ambulatory Visit (HOSPITAL_COMMUNITY)
Admission: RE | Admit: 2024-01-31 | Discharge: 2024-01-31 | Disposition: A | Discharge status: Home / Self Care | Source: Ambulatory Visit | Attending: Cardiology | Admitting: Cardiology

## 2024-01-31 VITALS — BP 96/74 | HR 122 | Ht 73.0 in | Wt 280.8 lb

## 2024-01-31 DIAGNOSIS — I255 Ischemic cardiomyopathy: Secondary | ICD-10-CM | POA: Diagnosis not present

## 2024-01-31 DIAGNOSIS — I4892 Unspecified atrial flutter: Secondary | ICD-10-CM | POA: Insufficient documentation

## 2024-01-31 DIAGNOSIS — I4819 Other persistent atrial fibrillation: Secondary | ICD-10-CM | POA: Diagnosis not present

## 2024-01-31 DIAGNOSIS — I071 Rheumatic tricuspid insufficiency: Secondary | ICD-10-CM | POA: Diagnosis not present

## 2024-01-31 DIAGNOSIS — Z6837 Body mass index (BMI) 37.0-37.9, adult: Secondary | ICD-10-CM | POA: Insufficient documentation

## 2024-01-31 DIAGNOSIS — Z79899 Other long term (current) drug therapy: Secondary | ICD-10-CM | POA: Diagnosis not present

## 2024-01-31 DIAGNOSIS — I5022 Chronic systolic (congestive) heart failure: Secondary | ICD-10-CM | POA: Insufficient documentation

## 2024-01-31 DIAGNOSIS — G4733 Obstructive sleep apnea (adult) (pediatric): Secondary | ICD-10-CM | POA: Diagnosis not present

## 2024-01-31 DIAGNOSIS — I2510 Atherosclerotic heart disease of native coronary artery without angina pectoris: Secondary | ICD-10-CM | POA: Insufficient documentation

## 2024-01-31 DIAGNOSIS — I480 Paroxysmal atrial fibrillation: Secondary | ICD-10-CM | POA: Insufficient documentation

## 2024-01-31 DIAGNOSIS — Z87891 Personal history of nicotine dependence: Secondary | ICD-10-CM | POA: Diagnosis not present

## 2024-01-31 DIAGNOSIS — F32A Depression, unspecified: Secondary | ICD-10-CM | POA: Diagnosis not present

## 2024-01-31 DIAGNOSIS — F419 Anxiety disorder, unspecified: Secondary | ICD-10-CM | POA: Insufficient documentation

## 2024-01-31 DIAGNOSIS — Z7984 Long term (current) use of oral hypoglycemic drugs: Secondary | ICD-10-CM | POA: Insufficient documentation

## 2024-01-31 DIAGNOSIS — E669 Obesity, unspecified: Secondary | ICD-10-CM | POA: Insufficient documentation

## 2024-01-31 DIAGNOSIS — Z7901 Long term (current) use of anticoagulants: Secondary | ICD-10-CM | POA: Insufficient documentation

## 2024-01-31 DIAGNOSIS — Z95810 Presence of automatic (implantable) cardiac defibrillator: Secondary | ICD-10-CM | POA: Insufficient documentation

## 2024-01-31 LAB — BASIC METABOLIC PANEL WITH GFR
Anion gap: 11 (ref 5–15)
BUN: 18 mg/dL (ref 8–23)
CO2: 27 mmol/L (ref 22–32)
Calcium: 9.3 mg/dL (ref 8.9–10.3)
Chloride: 97 mmol/L — ABNORMAL LOW (ref 98–111)
Creatinine, Ser: 1.47 mg/dL — ABNORMAL HIGH (ref 0.61–1.24)
GFR, Estimated: 53 mL/min — ABNORMAL LOW (ref >60)
Glucose, Bld: 121 mg/dL — ABNORMAL HIGH (ref 70–99)
Potassium: 4.8 mmol/L (ref 3.5–5.1)
Sodium: 135 mmol/L (ref 135–145)

## 2024-01-31 LAB — BRAIN NATRIURETIC PEPTIDE: B Natriuretic Peptide: 603.8 pg/mL — ABNORMAL HIGH (ref 0.0–100.0)

## 2024-01-31 MED ORDER — AMIODARONE HCL 200 MG PO TABS: 400.0000 mg | ORAL_TABLET | Freq: Two times a day (BID) | ORAL | 3 refills | Status: DC

## 2024-01-31 MED ORDER — FUROSEMIDE 20 MG PO TABS: ORAL_TABLET | ORAL | Status: DC

## 2024-01-31 NOTE — Patient Instructions (Addendum)
 Good to see you today!  INCREASE amiodarone  to 400 mg(2 tablet) Twice daily x 3 days then to 200 mg Twice daily  You have been referred to a fib  clinic they will call to schedule an appointment  You have been referred to pharmacy they will call to schedule an appointment  Labs done today, your results will be available in MyChart, we will contact you for abnormal readings.  Your physician recommends that you schedule a follow-up appointment 2 weeks as scheduled  If you have any questions or concerns before your next appointment please send us  a message through Frank or call our office at [phone].    TO LEAVE A MESSAGE FOR THE NURSE SELECT OPTION 2, PLEASE LEAVE A MESSAGE INCLUDING: YOUR NAME DATE OF BIRTH CALL BACK NUMBER REASON FOR CALL**this is important as we prioritize the call backs  YOU WILL RECEIVE A CALL BACK THE SAME DAY AS LONG AS YOU CALL BEFORE 4:00 PM At the [facility], you and your health needs are our priority. As part of our continuing mission to provide you with exceptional heart care, we have created designated Provider Care Teams. These Care Teams include your primary Cardiologist (physician) and Advanced Practice Providers (APPs- Physician Assistants and Nurse Practitioners) who all work together to provide you with the care you need, when you need it.   You may see any of the following providers on your designated Care Team at your next follow up: Dr Jules Oar Dr Peder Bourdon Dr. Alwin Baars Dr. Arta Lark Amy Marijane Shoulders, NP Ruddy Corral, [place] [facility] [place], [place] Dennise Fitz, NP [place] Lee, NP Shawnee Dellen, NP Luster Salters, PharmD Bevely Brush, PharmD   Please be sure to bring in all your medications bottles to every appointment.    Thank you for choosing [facility]

## 2024-02-01 ENCOUNTER — Other Ambulatory Visit: Payer: Self-pay | Admitting: Cardiology

## 2024-02-01 ENCOUNTER — Other Ambulatory Visit (HOSPITAL_COMMUNITY): Payer: Self-pay

## 2024-02-03 ENCOUNTER — Other Ambulatory Visit (HOSPITAL_COMMUNITY): Payer: Self-pay

## 2024-02-04 ENCOUNTER — Other Ambulatory Visit (HOSPITAL_COMMUNITY): Payer: Self-pay

## 2024-02-07 ENCOUNTER — Ambulatory Visit (HOSPITAL_COMMUNITY): Admitting: Physician Assistant

## 2024-02-09 ENCOUNTER — Other Ambulatory Visit (HOSPITAL_COMMUNITY): Payer: Self-pay

## 2024-02-10 ENCOUNTER — Encounter (HOSPITAL_COMMUNITY): Payer: Self-pay | Admitting: Interventional Radiology

## 2024-02-13 ENCOUNTER — Encounter: Payer: Self-pay | Admitting: Pharmacist

## 2024-02-13 ENCOUNTER — Telehealth: Payer: Self-pay

## 2024-02-13 ENCOUNTER — Ambulatory Visit: Attending: Cardiology | Admitting: Pharmacist

## 2024-02-13 ENCOUNTER — Other Ambulatory Visit (HOSPITAL_COMMUNITY): Payer: Self-pay

## 2024-02-13 DIAGNOSIS — G4733 Obstructive sleep apnea (adult) (pediatric): Secondary | ICD-10-CM | POA: Insufficient documentation

## 2024-02-13 DIAGNOSIS — I5022 Chronic systolic (congestive) heart failure: Secondary | ICD-10-CM

## 2024-02-13 NOTE — Telephone Encounter (Signed)
-----   Message from Eleanor JONETTA Crews sent at 02/13/2024 11:55 AM EDT ----- Please do prior authorization for Zepbound-OSA

## 2024-02-13 NOTE — Telephone Encounter (Signed)
 Pharmacy Patient Advocate Encounter   Received notification from Physician's Office that prior authorization for ZEPBOUND is required/requested.   Insurance verification completed.   The patient is insured through [facility] .   Per test claim: PA required; PA submitted to above mentioned insurance via CoverMyMeds Key/confirmation #/EOC [id number] Status is pending

## 2024-02-13 NOTE — Patient Instructions (Signed)

## 2024-02-13 NOTE — Progress Notes (Signed)
 Patient ID: Richard Graham                 DOB: 1955/08/19                    MRN: [id number]     HPI: Richard Graham is a 68 y.o. male patient of Dr. McLean/Turner's referred to pharmacy clinic by Caffie Shed, PA to initiate GLP1-RA therapy. PMH is significant for afib, CHF, CAD occluded mid LAD with collaterals, Right MCA aneurysm, OSA (AHI 32.8), depression/anxiety and obesity. Most recent BMI 35 kg/m .  Patient presents today to clinic accompanied by his wife.  He reports that he has been working on his portions and lost about 10 pounds in the last few weeks.  Of note he does also look like his fluid pill was increased recently.  He has cut back on his caffeine intake.  He never drank much alcohol  but he has stopped completely.  Does not smoke but has been around secondhand smoke.  About a year ago his wife fell and broke both her ankles.  She was in rehab and [place] and patient was at home by himself.  He stopped going to the gym and taking good care of himself which led to weight gain.  Recently he has been having issues with A-fib and was told not to exert himself.  [facility] but is waiting for [facility].  He has several questions about GLP-1 therapy.  The difference between Ozempic, Durand, Mounjaro, Zepbound were explained to him.  We talked about the potential for weight gain once medication stopped.  We talked about strategies to avoid or decrease this including dietary changes and resistance training.  Discussed the importance of protein in the meal.  Diet:  Breakfast: peppers carrots Lunch: celery, pimento cheese Dinner: salmon, chicken, tuna Drink: water, coffee  Exercise:  [facility] but has not done any exercise for about a year  Family History: No family history on file.   Social History: no ETOH, no tobacco  Labs: Lab Results  Component Value Date   HGBA1C 5.8 (H) 07/12/2022    Wt Readings from Last 1  Encounters:  01/31/24 280 lb 12.8 oz (127.4 kg)    BP Readings from Last 1 Encounters:  01/31/24 96/74   Pulse Readings from Last 1 Encounters:  01/31/24 (!) 122       Component Value Date/Time   CHOL 137 01/12/2024 1046   TRIG 174 (H) 01/12/2024 1046   HDL 41 01/12/2024 1046   CHOLHDL 3.3 01/12/2024 1046   CHOLHDL 3.1 04/22/2021 1527   VLDL 31 04/22/2021 1527   LDLCALC 67 01/12/2024 1046    Past Medical History:  Diagnosis Date   AICD (automatic cardioverter/defibrillator) present    CAD (coronary artery disease), native coronary artery    chronically occluded mid LAD with left to left collaterals.    Cerebral aneurysm    s/p embolization   Chronic systolic (congestive) heart failure (HCC)    biventricular HF with moderate RV dysfunction and severe LV dysfunction by Cardiac MRI with EF 22% no viability in the apical septal/apical/inferior,mid anterior and true apex.     Diabetes mellitus without complication (HCC)    on Jardiance    Gout    HLD (hyperlipidemia)    PAF (paroxysmal atrial fibrillation) (HCC)    s/p DCCV 12/2019   Pre-diabetes    diet controlled, no meds  Presence of permanent cardiac pacemaker    Gallant/Abbott Pacemaker, last remote check 10/19/21   Sleep apnea    use cpap nightly    Current Outpatient Medications on File Prior to Visit  Medication Sig Dispense Refill   amiodarone  (PACERONE ) 200 MG tablet Take 2 tablets (400 mg total) by mouth 2 (two) times daily. 400 mg Twice daily  X 3 days  then 200 mg Twice daily 360 tablet 3   apixaban  (ELIQUIS ) 5 MG TABS tablet Take 1 tablet (5 mg total) by mouth 2 (two) times daily. 180 tablet 3   ascorbic acid (VITAMIN C) 500 MG tablet Take 500 mg by mouth daily.     colchicine 0.6 MG tablet Take 0.6 mg by mouth 2 (two) times daily as needed (for gout flares).      Cyanocobalamin (VITAMIN B-12 PO) Take 1 tablet by mouth daily. Vitafusion     diclofenac sodium (VOLTAREN) 1 % GEL Apply 2 g topically 2 (two)  times daily as needed (to affected sites- for arthritis pain).      doxycycline  (VIBRA -TABS) 100 MG tablet Take 1 tablet (100 mg total) by mouth 2 (two) times daily. 14 tablet 0   empagliflozin  (JARDIANCE ) 10 MG TABS tablet Take 1 tablet (10 mg total) by mouth daily. 30 tablet 11   furosemide  (LASIX ) 20 MG tablet Take 60mg  in the morning, and 40mg  in the evening     guaiFENesin (MUCINEX) 600 MG 12 hr tablet Take 600-1,200 mg by mouth 2 (two) times daily as needed (congestion).     metoprolol  succinate (TOPROL -XL) 100 MG 24 hr tablet Take 1 tablet (100 mg total) by mouth in the morning AND 0.5 tablets (50 mg total) every evening. Take with or immediately following a meal. 180 tablet 2   Multiple Vitamins-Minerals (MULTIVITAMIN WITH MINERALS) tablet Take 1 tablet by mouth daily. Vitafusion     Multiple Vitamins-Minerals (ZINC PO) Take 1 tablet by mouth daily.     mupirocin ointment (BACTROBAN) 2 % Apply 1 Application topically 2 (two) times daily as needed (wound care).     oxyCODONE -acetaminophen  (PERCOCET/ROXICET) 5-325 MG tablet Take 1 tablet by mouth 4 (four) times daily.     rosuvastatin  (CRESTOR ) 5 MG tablet Take 1 tablet (5 mg total) by mouth daily. 90 tablet 0   sacubitril -valsartan  (ENTRESTO ) 49-51 MG Take 1 tablet by mouth 2 (two) times daily. 60 tablet 5   spironolactone  (ALDACTONE ) 25 MG tablet Take 1 tablet (25 mg total) by mouth daily. 90 tablet 3   tadalafil (CIALIS) 20 MG tablet Take 20 mg by mouth daily as needed for erectile dysfunction.     No current facility-administered medications on file prior to visit.    Allergies  Allergen Reactions   Influenza Virus Vaccine Other (See Comments)    Got really Sick     Assessment/Plan:  1. Weight loss - Patient has not met goal of at least 5% of body weight loss with comprehensive lifestyle modifications alone in the past 3-6 months. Pharmacotherapy is appropriate to pursue as augmentation. Will start Zepbound 2.5 mg weekly.  Confirmed patient  no personal or family history of medullary thyroid  carcinoma (MTC) or Multiple Endocrine Neoplasia syndrome type 2 (MEN 2). No hx of pancreatitis or gallstones.  Injection technique reviewed at today's visit.  Advised patient on common side effects including nausea, diarrhea, dyspepsia, decreased appetite, and fatigue. Counseled patient on reducing meal size and how to titrate medication to minimize side effects. Counseled patient to call if  intolerable side effects or if experiencing dehydration, abdominal pain, or dizziness. Along with pharmacotherapy patient will adhere to dietary modifications and will target at least 150 minutes of moderate intensity exercise weekly.  Patient will resume exercise once cleared from an A-fib perspective.  Handout on nutrition given to patient.  Follow up in 1-2 days regarding coverage of Zepbound. If therapy is initiated, phone follow-ups will be conducted every 4 weeks for dose titration until the patient reaches the effective therapeutic dose and target weight.  Reganne Messerschmidt D Motty Borin, Pharm.JONETTA SARAN, CPP [facility] [address], [place], [place] [address]  Phone: [phone]; Fax: [phone]

## 2024-02-14 ENCOUNTER — Other Ambulatory Visit (HOSPITAL_COMMUNITY): Payer: Self-pay

## 2024-02-14 MED ORDER — ZEPBOUND 2.5 MG/0.5ML ~~LOC~~ SOAJ
2.5000 mg | SUBCUTANEOUS | 0 refills | Status: DC
  Filled 2024-02-14: qty 2, 28d supply, fill #0

## 2024-02-14 NOTE — Telephone Encounter (Signed)
 Pharmacy Patient Advocate Encounter  Received notification from [facility] that Prior Authorization for ZEPBOUND has been APPROVED from 02/13/24 to 02/12/25. Ran test claim, Copay is $0. This test claim was processed through [facility]- copay amounts may vary at other pharmacies due to pharmacy/plan contracts, or as the patient moves through the different stages of their insurance plan.

## 2024-02-15 ENCOUNTER — Other Ambulatory Visit (HOSPITAL_COMMUNITY): Payer: Self-pay

## 2024-02-15 ENCOUNTER — Other Ambulatory Visit (HOSPITAL_COMMUNITY): Payer: Self-pay | Admitting: Cardiology

## 2024-02-15 DIAGNOSIS — G4733 Obstructive sleep apnea (adult) (pediatric): Secondary | ICD-10-CM | POA: Diagnosis not present

## 2024-02-16 ENCOUNTER — Other Ambulatory Visit (HOSPITAL_COMMUNITY): Payer: Self-pay

## 2024-02-16 ENCOUNTER — Encounter (HOSPITAL_COMMUNITY)

## 2024-02-16 MED ORDER — FUROSEMIDE 20 MG PO TABS
ORAL_TABLET | ORAL | 3 refills | Status: DC
Start: 2024-02-16 — End: 2024-02-24
  Filled 2024-02-16: qty 150, 30d supply, fill #0

## 2024-02-20 ENCOUNTER — Encounter: Payer: Self-pay | Admitting: Internal Medicine

## 2024-02-20 ENCOUNTER — Other Ambulatory Visit (HOSPITAL_COMMUNITY): Payer: Self-pay

## 2024-02-20 ENCOUNTER — Telehealth (HOSPITAL_COMMUNITY): Payer: Self-pay

## 2024-02-20 NOTE — Progress Notes (Signed)
 ADVANCED HF CLINIC NOTE PCP: Valentin Skates, DO Cardiology: Dr. Shlomo HF Cardiology: Dr. Rolan Reason for Visit: Heart Failure and AFL Follow-up  HPI: 68 y.o. with history of CAD, PAF, and ischemic cardiomyopathy. Patent was admitted to [facility] in 3/21 with CHF and atrial fibrillation/RVR. Echo showed EF 20-25%.  LHC showing occluded mid LAD with collaterals.  CMRI showed EF 22%, apical aneurysm, no significant LAD territory viability; therefore, patient did not have CABG or PCI. Patient was additionally found to have right MCA aneurysm. This was treated with embolization in 4/21. In 5/21, he had DCCV back to NSR. Had remained in NSR on amiodarone . Repeat echo in 6/21 showed EF 20-25% with moderately decreased RV systolic function. St Jude ICD was placed. Patient has also been diagnosed with OSA and has started CPAP.   CPX 11/21 with no clear HF limitation.   Echo in 12/22 showed EF 35-40%, septal/apical akinesis, inferoapical dyskinesis, mild LV enlargement, RV normal, IVC normal.   He had an inappropriate ICD shock in 1/25 due to AF/RVR.   Seen for f/u 01/12/24 and was in AF/RVR again, asymptomatic. K was 5.3, KCL stopped. He was also found to have 2 ticks on him and was started on doxycycline .   S/p successful TEE/DCCV on 6/3. TEE LV EF 30-35%. RV nl. Mild-moderate TR, no LA appendage thrombus. No ASD or PFO by color doppler. Trivial MR. Normal caliber thoracic aorta without significant plaque noted. Seen for post-procedure follow up 01/31/24 with ERAA AFL/RVR 122 bpm, asymptomatic. Referred to Afib Clinic and seen 02/21/24, he was referred to EP for ablation consultation.   He returns today for heart failure follow up. Overall feeling well, has been losing weight. NYHA II. Reports dyspnea with lots of exertion. Remains in ALF, asymptomatic Denies dizziness, dyspnea at rest. Able to perform ADLs. Appetite okay. Has significantly changed his diet. No ETOH, limiting caffeine. Has lots of questions  about supplements that he is not taking. Compliant with all medications. Compliant with CPAP.   PMH: 1. Cerebral aneurysm: Right MCA aneurysm, embolized in 4/21 by IR.  2. Atrial fibrillation: Paroxysmal.  - DCCV 5/21 - Inappropriate ICD shock in 1/25 for AF/RVR.  3. CAD: LHC (3/21) with totally occluded mid LAD with collaterals.  4. Chronic systolic CHF: Ischemic cardiomyopathy.   - Cardiac MRI (5/21): EF 22%, apical aneurysm, LAD territory scarring without evidence for significant viability. RV EF 31%.  - Echo (6/21): EF 20-25%, moderately decreased RV systolic function.  - St Jude ICD.  - CPX (11/21): peak VO2 17.9 (81% predicted), VE/VCO2 slope 25, RER 1.17 => no clear HF limitation.  - Echo (9/22): EF 35-40% with peri-apical akinesis and apical inferior aneurysm, no LV thrombus noted, mildly decreased RV systolic function, normal IVC.  - Echo (12/22): EF 35-40%, septal/apical akinesis, inferoapical dyskinesis, mild LV enlargement, RV normal, IVC normal 5. Gout 6. OSA: Using CPAP.  7. Osteoarthritis 8. Depression/anxiety  Social History   Socioeconomic History   Marital status: Married    Spouse name: Not on file   Number of children: 0   Years of education: Not on file   Highest education level: Some college, no degree  Occupational History   Not on file  Tobacco Use   Smoking status: Former    Current packs/day: 0.00    Average packs/day: 0.3 packs/day for 3.0 years (0.8 ttl pk-yrs)    Types: Cigarettes    Start date: 08/16/2008    Quit date: 08/17/2011    Years  since quitting: 12.5   Smokeless tobacco: Never   Tobacco comments:    Former smoker 02/21/24  Vaping Use   Vaping status: Never Used  Substance and Sexual Activity   Alcohol  use: Yes    Alcohol /week: 2.0 standard drinks of alcohol     Types: 2 Glasses of wine per week   Drug use: No   Sexual activity: Yes  Other Topics Concern   Not on file  Social History Narrative   Not on file   Social Drivers of  Health   Financial Resource Strain: Not on file  Food Insecurity: Not on file  Transportation Needs: Not on file  Physical Activity: Not on file  Stress: Not on file  Social Connections: Not on file  Intimate Partner Violence: Not on file   FH: No premature CAD or CHF.   ROS: All systems reviewed and negative except as per HPI.   Current Outpatient Medications  Medication Sig Dispense Refill   amiodarone  (PACERONE ) 200 MG tablet Take 2 tablets (400 mg total) by mouth 2 (two) times daily. 400 mg Twice daily  X 3 days  then 200 mg Twice daily (Patient taking differently: Take 400 mg by mouth 2 (two) times daily. 200 mg twice daily) 360 tablet 3   apixaban  (ELIQUIS ) 5 MG TABS tablet Take 1 tablet (5 mg total) by mouth 2 (two) times daily. 180 tablet 3   ascorbic acid (VITAMIN C) 500 MG tablet Take 500 mg by mouth daily.     colchicine 0.6 MG tablet Take 0.6 mg by mouth 2 (two) times daily as needed (for gout flares).      diclofenac sodium (VOLTAREN) 1 % GEL Apply 2 g topically 2 (two) times daily as needed (to affected sites- for arthritis pain).      empagliflozin  (JARDIANCE ) 10 MG TABS tablet Take 1 tablet (10 mg total) by mouth daily. 30 tablet 11   furosemide  (LASIX ) 20 MG tablet Take 3 tablets (60 mg total) by mouth every morning AND 2 tablets (40 mg total) every evening. 150 tablet 3   metoprolol  succinate (TOPROL -XL) 100 MG 24 hr tablet Take 1 tablet (100 mg total) by mouth in the morning AND 0.5 tablets (50 mg total) every evening. Take with or immediately following a meal. 180 tablet 2   Multiple Vitamins-Minerals (MULTIVITAMIN WITH MINERALS) tablet Take 1 tablet by mouth daily. Vitafusion     mupirocin ointment (BACTROBAN) 2 % Apply 1 Application topically 2 (two) times daily as needed (wound care).     oxyCODONE -acetaminophen  (PERCOCET/ROXICET) 5-325 MG tablet Take 1 tablet by mouth 4 (four) times daily.     rosuvastatin  (CRESTOR ) 5 MG tablet Take 1 tablet (5 mg total) by mouth  daily. 90 tablet 0   sacubitril -valsartan  (ENTRESTO ) 49-51 MG Take 1 tablet by mouth 2 (two) times daily. 60 tablet 5   spironolactone  (ALDACTONE ) 25 MG tablet Take 1 tablet (25 mg total) by mouth daily. 90 tablet 3   tadalafil (CIALIS) 20 MG tablet Take 20 mg by mouth daily as needed for erectile dysfunction.     tirzepatide  (ZEPBOUND ) 2.5 MG/0.5ML Pen Inject 2.5 mg into the skin once a week. 2 mL 0   doxycycline  (VIBRA -TABS) 100 MG tablet Take 1 tablet (100 mg total) by mouth 2 (two) times daily. 14 tablet 0   No current facility-administered medications for this encounter.   BP 128/78   Pulse (!) 125   Ht 6' 1 (1.854 m)   Wt 121.9 kg (268 lb  12.8 oz)   SpO2 96%   BMI 35.46 kg/m   Wt Readings from Last 3 Encounters:  02/24/24 121.9 kg (268 lb 12.8 oz)  02/21/24 121.6 kg (268 lb)  02/13/24 122.5 kg (270 lb)    PHYSICAL EXAM: General: Well appearing. No distress on RA Cardiac: JVP flat. S1 and S2 present. No murmurs or rub. Tachycardiac and regular on auscultation Extremities: Warm and dry.  No peripheral edema.  Neuro: Alert and oriented x3. Affect pleasant. Moves all extremities without difficulty.  ECG from 02/21/24 reviewed: 2:1 AFL 117 bpm  Device Interrogation (personally reviewed): 0 VT/VF, <1% VP, no new episodes  Assessment/Plan: 1. CAD: LHC in 3/21 with occluded mid LAD with collaterals. CMRI was not suggestive of LAD territory viability, there was an apical aneurysm but no thrombus. Medical management. Stable w/o CP   - continue medical therapy  - he is not on ASA with Eliquis   - Continue Crestor  5 mg daily. Lipid panel 5/25 w/ LDL at goal at 67 mg/dL.   2. Atrial fibrillation/flutter: Paroxysmal, s/p DCCV to NSR in 5/21.  Inappropriate ICD shock for AF/RVR in 1/25.  Was in AFL of unknown duration last visit 5/25. S/p DCCV on 01/17/24.  EKG 6/25 shows he is back in AFL w/ RVR 122 bpm. HR 125 bpm at clinic visit today, regular on auscultation. Asymptomatic  - continue  amio 200 mg bid. LFTs, TFT 5/25 WNL. Needs annual eye exam - continue Eliquis  5 mg bid.   - continue to refrain from ETOH and caffeine - compliant with CPAP - seen in AF Clinic 02/21/24 and referred to EP to discuss ablation  3. Chronic systolic CHF: Ischemic cardiomyopathy.  Echo in 6/21 with EF 20-25%, moderately decreased RV systolic function.  St Jude ICD, not CRT candidate. CPX in 11/21 with minimal HF limitation. Echo in 12/22 showed EF 35-40%, apical inferior aneurysm. TEE 6/25 EF 30-35%, RV normal.  - NYHA Class II. Euvolemic by exam. Labs reviewed from 02/21/24. Cr up to 1.53. BNP 83.5. At risk for fluid accumulation with AFL RVR. Discussed with patient and wife to call for signs of fluid with decreasing Lasix . - Repeat BMET in 10 days - Continue Entresto  49/51 bid.  - Decrease Lasix  to 40 qam/20 qpm  - Continue spironolactone  25 mg daily.  - Continue Farxiga  10 mg daily.   - Continue Toprol  XL 100 qam, 50 qpm.     4. Right MCA aneurysm: S/p embolization.   5. OSA: reports compliance w/ CPAP. Continue  6. Depression/anxiety:   - Continue sertraline . Managed by PCP   7. Obesity: Body mass index is 35.46 kg/m. - Now on Zepbound . Losing weight. Congratulated  8. Tick bite: Completed doxycycline  course.   Follow up in 6 weeks with APP. Instructed to call with any change in HF symptoms.   [place] Lavonta Tillis, NP 02/24/2024

## 2024-02-20 NOTE — Telephone Encounter (Signed)
 Received call from patient reporting that he may not still be Atrial fib- reports that he has had readings on his BP  cuff with HR's in the 60's, and intermittently in the low 100's- patient reports that his blood pressure has been in the low 90's to 85 systolic, reports that this is normal for him and he denies any symptoms or issues. Reports that he has lost 10 pounds, and is following his diet after starting zepbound . Advised patient to keep his appointment with Atrial Fib clinic tomorrow- patient aware and verbalized understanding.

## 2024-02-21 ENCOUNTER — Ambulatory Visit (HOSPITAL_COMMUNITY)
Admission: RE | Admit: 2024-02-21 | Discharge: 2024-02-21 | Disposition: A | Discharge status: Home / Self Care | Source: Ambulatory Visit | Attending: Physician Assistant | Admitting: Physician Assistant

## 2024-02-21 ENCOUNTER — Encounter (HOSPITAL_COMMUNITY): Payer: Self-pay | Admitting: Physician Assistant

## 2024-02-21 VITALS — BP 98/70 | HR 117 | Ht 73.0 in | Wt 268.0 lb

## 2024-02-21 DIAGNOSIS — Z5181 Encounter for therapeutic drug level monitoring: Secondary | ICD-10-CM

## 2024-02-21 DIAGNOSIS — I484 Atypical atrial flutter: Secondary | ICD-10-CM

## 2024-02-21 DIAGNOSIS — I4892 Unspecified atrial flutter: Secondary | ICD-10-CM

## 2024-02-21 DIAGNOSIS — D6869 Other thrombophilia: Secondary | ICD-10-CM

## 2024-02-21 DIAGNOSIS — Z79899 Other long term (current) drug therapy: Secondary | ICD-10-CM

## 2024-02-21 DIAGNOSIS — I4819 Other persistent atrial fibrillation: Secondary | ICD-10-CM

## 2024-02-21 NOTE — Progress Notes (Signed)
 Primary Care Physician: Valentin Skates, DO Primary Cardiologist: Wilbert Bihari, MD Electrophysiologist: Danelle Birmingham, MD  [facility]: Dr Rolan Referring Physician: Laymon Pour PA   Richard Graham is a 68 y.o. male with a history of CAD, CHF s/p ICD, cerebral aneurysm s/p embolization, OSA, atrial flutter, atrial fibrillation who presents for follow up in the [facility].  The patient has been maintained on amiodarone . He was found to be in atrial flutter at his visit 01/12/24 and underwent DCCV on 01/17/24. However, he was back in atrial flutter by his visit on 01/31/24. Patient is on Eliquis  for stroke prevention.    Patient presents today for follow up for atrial fibrillation and atrial flutter. He remains in atrial flutter with sub optimal rate control. He is unaware of his arrhythmia. No bleeding issues on anticoagulation. He has stopped drinking alcohol  and uses his CPAP consistently.   Today, he denies symptoms of palpitations, chest pain, shortness of breath, orthopnea, PND, lower extremity edema, dizziness, presyncope, syncope, bleeding, or neurologic sequela. The patient is tolerating medications without difficulties and is otherwise without complaint today.    Atrial Fibrillation Risk Factors:  he does have symptoms or diagnosis of sleep apnea. he does not have a history of rheumatic fever. he does not have a history of alcohol  use. The patient does not have a history of early familial atrial fibrillation or other arrhythmias.  Atrial Fibrillation Management history:  Previous antiarrhythmic drugs: amiodarone   Previous cardioversions: 12/28/19, 01/17/24 Previous ablations: none Anticoagulation history: Eliquis   ROS- All systems are reviewed and negative except as per the HPI above.  Past Medical History:  Diagnosis Date   AICD (automatic cardioverter/defibrillator) present    CAD (coronary artery disease), native coronary artery    chronically  occluded mid LAD with left to left collaterals.    Cerebral aneurysm    s/p embolization   Chronic systolic (congestive) heart failure (HCC)    biventricular HF with moderate RV dysfunction and severe LV dysfunction by Cardiac MRI with EF 22% no viability in the apical septal/apical/inferior,mid anterior and true apex.     Diabetes mellitus without complication (HCC)    on Jardiance    Gout    HLD (hyperlipidemia)    PAF (paroxysmal atrial fibrillation) (HCC)    s/p DCCV 12/2019   Pre-diabetes    diet controlled, no meds   Presence of permanent cardiac pacemaker    Gallant/Abbott Pacemaker, last remote check 10/19/21   Sleep apnea    use cpap nightly    Current Outpatient Medications  Medication Sig Dispense Refill   amiodarone  (PACERONE ) 200 MG tablet Take 2 tablets (400 mg total) by mouth 2 (two) times daily. 400 mg Twice daily  X 3 days  then 200 mg Twice daily 360 tablet 3   apixaban  (ELIQUIS ) 5 MG TABS tablet Take 1 tablet (5 mg total) by mouth 2 (two) times daily. 180 tablet 3   ascorbic acid (VITAMIN C) 500 MG tablet Take 500 mg by mouth daily.     colchicine 0.6 MG tablet Take 0.6 mg by mouth 2 (two) times daily as needed (for gout flares).      diclofenac sodium (VOLTAREN) 1 % GEL Apply 2 g topically 2 (two) times daily as needed (to affected sites- for arthritis pain).      doxycycline  (VIBRA -TABS) 100 MG tablet Take 1 tablet (100 mg total) by mouth 2 (two) times daily. 14 tablet 0   empagliflozin  (JARDIANCE ) 10 MG TABS tablet  Take 1 tablet (10 mg total) by mouth daily. 30 tablet 11   furosemide  (LASIX ) 20 MG tablet Take 3 tablets (60 mg total) by mouth every morning AND 2 tablets (40 mg total) every evening. 150 tablet 3   metoprolol  succinate (TOPROL -XL) 100 MG 24 hr tablet Take 1 tablet (100 mg total) by mouth in the morning AND 0.5 tablets (50 mg total) every evening. Take with or immediately following a meal. 180 tablet 2   Multiple Vitamins-Minerals (MULTIVITAMIN WITH  MINERALS) tablet Take 1 tablet by mouth daily. Vitafusion     mupirocin ointment (BACTROBAN) 2 % Apply 1 Application topically 2 (two) times daily as needed (wound care).     oxyCODONE -acetaminophen  (PERCOCET/ROXICET) 5-325 MG tablet Take 1 tablet by mouth 4 (four) times daily.     rosuvastatin  (CRESTOR ) 5 MG tablet Take 1 tablet (5 mg total) by mouth daily. 90 tablet 0   sacubitril -valsartan  (ENTRESTO ) 49-51 MG Take 1 tablet by mouth 2 (two) times daily. 60 tablet 5   spironolactone  (ALDACTONE ) 25 MG tablet Take 1 tablet (25 mg total) by mouth daily. 90 tablet 3   tadalafil (CIALIS) 20 MG tablet Take 20 mg by mouth daily as needed for erectile dysfunction.     tirzepatide  (ZEPBOUND ) 2.5 MG/0.5ML Pen Inject 2.5 mg into the skin once a week. 2 mL 0   No current facility-administered medications for this encounter.    Physical Exam: BP 98/70   Pulse (!) 117   Ht 6' 1 (1.854 m)   Wt 121.6 kg   BMI 35.36 kg/m   GEN: Well nourished, well developed in no acute distress NECK: No JVD CARDIAC: Irregularly irregular rate and rhythm, no murmurs, rubs, gallops RESPIRATORY:  Clear to auscultation without rales, wheezing or rhonchi  ABDOMEN: Soft, non-tender, non-distended EXTREMITIES:  No edema; No deformity   Wt Readings from Last 3 Encounters:  02/21/24 121.6 kg  02/13/24 122.5 kg  01/31/24 127.4 kg     EKG today demonstrates  Atrial flutter with 2:1 block, RBBB Vent. rate 117 BPM PR interval 96 ms QRS duration 176 ms QT/QTcB 410/571 ms   Echo 07/23/21 demonstrated   1. Septal and apical akinesis no mural apical thrombus Inferior apical  dyskinesis EF and RWMAls similar to echo done 04/22/21. Left ventricular  ejection fraction, by estimation, is 35 to 40%. The left ventricle has  moderately decreased function. The left ventricle demonstrates regional wall motion abnormalities (see scoring diagram/findings for description). The left ventricular internal cavity size was mildly dilated.  There is mild asymmetric left ventricular hypertrophy of the basal and septal segments. Left ventricular diastolic parameters are consistent with Grade I diastolic dysfunction (impaired relaxation).   2. AICD wires in RA/RV. Right ventricular systolic function is normal.  The right ventricular size is normal. There is normal pulmonary artery  systolic pressure.   3. The mitral valve is normal in structure. No evidence of mitral valve  regurgitation. No evidence of mitral stenosis.   4. The aortic valve is normal in structure. Aortic valve regurgitation is  not visualized. No aortic stenosis is present.   5. The inferior vena cava is normal in size with greater than 50%  respiratory variability, suggesting right atrial pressure of 3 mmHg.     CHA2DS2-VASc Score = 3  The patient's score is based upon: CHF History: 1 HTN History: 0 Diabetes History: 0 Stroke History: 0 Vascular Disease History: 1 Age Score: 1 Gender Score: 0       ASSESSMENT  AND PLAN: Persistent Atrial Fibrillation/atrial flutter The patient's CHA2DS2-VASc score is 3, indicating a 3.2% annual risk of stroke.   S/p DCCV 01/17/24 with quick return of atrial flutter despite higher doses of amiodarone .  We discussed rhythm control options including changing AAD to dofetilide vs ablation. He is agreeable to consultation with EP for ablation, will refer. Continue Toprol  100 mg AM and 50 mg PM Continue amiodarone  200 mg BID for now Continue Eliquis  5 mg BID  Secondary Hypercoagulable State (ICD10:  D68.69) The patient is at significant risk for stroke/thromboembolism based upon his CHA2DS2-VASc Score of 3.  Continue Apixaban  (Eliquis ). No bleeding issues.   High Risk Medication Monitoring (ICD 10: Z79.899) Intervals on ECG acceptable for amiodarone  monitoring. Recent cmet and TSH reviewed.   Chronic HFrEF ICM EF 35-40%, s/p ICD GDMT per [facility] team Fluid status appears stable today  CAD No anginal symptoms Followed  by Dr Rolan  Obesity Body mass index is 35.36 kg/m.  Encouraged lifestyle modification Currently on Zepbound  and has lost ~12 lbs.     Follow up with EP to discuss ablation.        [facility] [address] [place], [place] [address] [phone]

## 2024-02-22 LAB — BASIC METABOLIC PANEL WITH GFR
BUN/Creatinine Ratio: 20 (ref 10–24)
BUN: 30 mg/dL — ABNORMAL HIGH (ref 8–27)
CO2: 21 mmol/L (ref 20–29)
Calcium: 9.5 mg/dL (ref 8.6–10.2)
Chloride: 98 mmol/L (ref 96–106)
Creatinine, Ser: 1.53 mg/dL — ABNORMAL HIGH (ref 0.76–1.27)
Glucose: 90 mg/dL (ref 70–99)
Potassium: 5.1 mmol/L (ref 3.5–5.2)
Sodium: 137 mmol/L (ref 134–144)
eGFR: 50 mL/min/1.73 — ABNORMAL LOW (ref >59)

## 2024-02-22 LAB — BRAIN NATRIURETIC PEPTIDE: BNP: 83.5 pg/mL (ref 0.0–100.0)

## 2024-02-23 ENCOUNTER — Telehealth (HOSPITAL_COMMUNITY): Payer: Self-pay

## 2024-02-23 NOTE — Telephone Encounter (Signed)
 Called to confirm/remind patient of their appointment at the [facility] on 02/24/24.   Appointment:   [x] Confirmed  [] Left mess   [] No answer/No voice mail  [] VM Full/unable to leave message  [] Phone not in service  Patient reminded to bring all medications and/or complete list.  Confirmed patient has transportation. Gave directions, instructed to utilize valet parking.

## 2024-02-24 ENCOUNTER — Encounter (HOSPITAL_COMMUNITY): Payer: Self-pay

## 2024-02-24 ENCOUNTER — Other Ambulatory Visit (HOSPITAL_COMMUNITY): Payer: Self-pay

## 2024-02-24 ENCOUNTER — Ambulatory Visit (HOSPITAL_COMMUNITY)
Admission: RE | Admit: 2024-02-24 | Discharge: 2024-02-24 | Disposition: A | Discharge status: Home / Self Care | Source: Ambulatory Visit | Attending: Cardiology

## 2024-02-24 VITALS — BP 128/78 | HR 125 | Ht 73.0 in | Wt 268.8 lb

## 2024-02-24 DIAGNOSIS — I480 Paroxysmal atrial fibrillation: Secondary | ICD-10-CM | POA: Diagnosis not present

## 2024-02-24 DIAGNOSIS — I2510 Atherosclerotic heart disease of native coronary artery without angina pectoris: Secondary | ICD-10-CM | POA: Insufficient documentation

## 2024-02-24 DIAGNOSIS — I671 Cerebral aneurysm, nonruptured: Secondary | ICD-10-CM | POA: Diagnosis not present

## 2024-02-24 DIAGNOSIS — I255 Ischemic cardiomyopathy: Secondary | ICD-10-CM | POA: Diagnosis not present

## 2024-02-24 DIAGNOSIS — Z6835 Body mass index (BMI) 35.0-35.9, adult: Secondary | ICD-10-CM | POA: Insufficient documentation

## 2024-02-24 DIAGNOSIS — Z79899 Other long term (current) drug therapy: Secondary | ICD-10-CM | POA: Diagnosis not present

## 2024-02-24 DIAGNOSIS — F419 Anxiety disorder, unspecified: Secondary | ICD-10-CM | POA: Diagnosis not present

## 2024-02-24 DIAGNOSIS — Z87891 Personal history of nicotine dependence: Secondary | ICD-10-CM | POA: Diagnosis not present

## 2024-02-24 DIAGNOSIS — Z7901 Long term (current) use of anticoagulants: Secondary | ICD-10-CM | POA: Insufficient documentation

## 2024-02-24 DIAGNOSIS — I4892 Unspecified atrial flutter: Secondary | ICD-10-CM

## 2024-02-24 DIAGNOSIS — Z9889 Other specified postprocedural states: Secondary | ICD-10-CM | POA: Diagnosis not present

## 2024-02-24 DIAGNOSIS — I5022 Chronic systolic (congestive) heart failure: Secondary | ICD-10-CM | POA: Diagnosis not present

## 2024-02-24 DIAGNOSIS — Z95810 Presence of automatic (implantable) cardiac defibrillator: Secondary | ICD-10-CM | POA: Insufficient documentation

## 2024-02-24 DIAGNOSIS — G4733 Obstructive sleep apnea (adult) (pediatric): Secondary | ICD-10-CM | POA: Diagnosis not present

## 2024-02-24 DIAGNOSIS — F32A Depression, unspecified: Secondary | ICD-10-CM | POA: Insufficient documentation

## 2024-02-24 DIAGNOSIS — E66812 Obesity, class 2: Secondary | ICD-10-CM

## 2024-02-24 DIAGNOSIS — E669 Obesity, unspecified: Secondary | ICD-10-CM | POA: Diagnosis not present

## 2024-02-24 MED ORDER — FUROSEMIDE 20 MG PO TABS
ORAL_TABLET | ORAL | 3 refills | Status: DC
  Filled 2024-02-24: qty 270, fill #0

## 2024-02-24 NOTE — Patient Instructions (Signed)
 Decrease Lasix  to 40 mg in am and 20 mg in pm. Updated Rx sent. Labs in 10 days - see below. Return to Heart Failure APP Clinic in one month - see below. Please call us  at [phone] if any questions or concerns prior to next visit.

## 2024-02-27 ENCOUNTER — Ambulatory Visit (HOSPITAL_COMMUNITY)
Admission: RE | Admit: 2024-02-27 | Discharge: 2024-02-27 | Discharge status: Home / Self Care | Source: Ambulatory Visit | Attending: Cardiology | Admitting: Cardiology

## 2024-02-27 ENCOUNTER — Other Ambulatory Visit: Payer: Self-pay | Admitting: Cardiology

## 2024-02-27 ENCOUNTER — Other Ambulatory Visit: Payer: Self-pay

## 2024-02-27 ENCOUNTER — Other Ambulatory Visit (HOSPITAL_COMMUNITY): Payer: Self-pay

## 2024-02-27 DIAGNOSIS — I4819 Other persistent atrial fibrillation: Secondary | ICD-10-CM | POA: Diagnosis not present

## 2024-02-27 DIAGNOSIS — I5022 Chronic systolic (congestive) heart failure: Secondary | ICD-10-CM | POA: Insufficient documentation

## 2024-02-27 DIAGNOSIS — I2510 Atherosclerotic heart disease of native coronary artery without angina pectoris: Secondary | ICD-10-CM | POA: Insufficient documentation

## 2024-02-27 DIAGNOSIS — E119 Type 2 diabetes mellitus without complications: Secondary | ICD-10-CM | POA: Insufficient documentation

## 2024-02-27 LAB — ECHOCARDIOGRAM COMPLETE
AR max vel: 2.75 cm2
AV Area VTI: 2.52 cm2
AV Area mean vel: 2.39 cm2
AV Mean grad: 2 mmHg
AV Peak grad: 2.8 mmHg
Ao pk vel: 0.83 m/s
Area-P 1/2: 4.49 cm2
S' Lateral: 3.6 cm

## 2024-02-27 MED ORDER — PERFLUTREN LIPID MICROSPHERE
1.0000 mL | INTRAVENOUS | Status: AC | PRN
  Administered 2024-02-27: 2 mL via INTRAVENOUS

## 2024-02-27 MED ORDER — ROSUVASTATIN CALCIUM 5 MG PO TABS
5.0000 mg | ORAL_TABLET | Freq: Every day | ORAL | 3 refills | Status: DC
  Filled 2024-02-27: qty 90, 90d supply, fill #0

## 2024-02-29 ENCOUNTER — Other Ambulatory Visit (HOSPITAL_COMMUNITY): Payer: Self-pay

## 2024-03-01 ENCOUNTER — Other Ambulatory Visit: Payer: Self-pay

## 2024-03-05 ENCOUNTER — Ambulatory Visit (HOSPITAL_COMMUNITY)
Admission: RE | Admit: 2024-03-05 | Discharge: 2024-03-05 | Disposition: A | Discharge status: Home / Self Care | Source: Ambulatory Visit | Attending: Cardiology | Admitting: Cardiology

## 2024-03-05 ENCOUNTER — Other Ambulatory Visit (HOSPITAL_COMMUNITY): Payer: Self-pay

## 2024-03-05 ENCOUNTER — Ambulatory Visit (HOSPITAL_COMMUNITY): Payer: Self-pay | Admitting: Cardiology

## 2024-03-05 DIAGNOSIS — I5022 Chronic systolic (congestive) heart failure: Secondary | ICD-10-CM

## 2024-03-05 LAB — BASIC METABOLIC PANEL WITH GFR
Anion gap: 13 (ref 5–15)
BUN: 40 mg/dL — ABNORMAL HIGH (ref 8–23)
CO2: 22 mmol/L (ref 22–32)
Calcium: 9.7 mg/dL (ref 8.9–10.3)
Chloride: 100 mmol/L (ref 98–111)
Creatinine, Ser: 2.11 mg/dL — ABNORMAL HIGH (ref 0.61–1.24)
GFR, Estimated: 34 mL/min — ABNORMAL LOW (ref >60)
Glucose, Bld: 91 mg/dL (ref 70–99)
Potassium: 5.2 mmol/L — ABNORMAL HIGH (ref 3.5–5.1)
Sodium: 135 mmol/L (ref 135–145)

## 2024-03-05 MED ORDER — SPIRONOLACTONE 25 MG PO TABS: 12.5000 mg | ORAL_TABLET | Freq: Every day | ORAL | Status: DC

## 2024-03-05 MED ORDER — FUROSEMIDE 20 MG PO TABS: 40.0000 mg | ORAL_TABLET | Freq: Every day | ORAL | Status: DC

## 2024-03-06 DIAGNOSIS — Z113 Encounter for screening for infections with a predominantly sexual mode of transmission: Secondary | ICD-10-CM | POA: Diagnosis not present

## 2024-03-06 DIAGNOSIS — I4819 Other persistent atrial fibrillation: Secondary | ICD-10-CM | POA: Diagnosis not present

## 2024-03-06 DIAGNOSIS — I5022 Chronic systolic (congestive) heart failure: Secondary | ICD-10-CM | POA: Diagnosis not present

## 2024-03-09 ENCOUNTER — Other Ambulatory Visit: Payer: Self-pay | Admitting: Cardiology

## 2024-03-09 ENCOUNTER — Other Ambulatory Visit (HOSPITAL_COMMUNITY): Payer: Self-pay

## 2024-03-10 ENCOUNTER — Other Ambulatory Visit (HOSPITAL_COMMUNITY): Payer: Self-pay

## 2024-03-12 ENCOUNTER — Other Ambulatory Visit: Payer: Self-pay

## 2024-03-12 ENCOUNTER — Other Ambulatory Visit (HOSPITAL_COMMUNITY): Payer: Self-pay

## 2024-03-12 ENCOUNTER — Other Ambulatory Visit: Payer: Self-pay | Admitting: Cardiology

## 2024-03-12 MED ORDER — ZEPBOUND 5 MG/0.5ML ~~LOC~~ SOAJ
5.0000 mg | SUBCUTANEOUS | 0 refills | Status: DC
  Filled 2024-03-12: qty 2, 28d supply, fill #0

## 2024-03-12 NOTE — Addendum Note (Signed)
 Addended by: Merci Walthers D on: 03/12/2024 02:55 PM   Modules accepted: Orders

## 2024-03-15 ENCOUNTER — Ambulatory Visit: Payer: BC Managed Care – PPO

## 2024-03-15 DIAGNOSIS — I255 Ischemic cardiomyopathy: Secondary | ICD-10-CM

## 2024-03-15 LAB — CUP PACEART REMOTE DEVICE CHECK
Battery Remaining Longevity: 74 mo
Battery Remaining Percentage: 63 %
Battery Voltage: 2.96 V
Brady Statistic RV Percent Paced: 1 %
Date Time Interrogation Session: 20250731020043
HighPow Impedance: 81 Ohm
Implantable Lead Connection Status: 753985
Implantable Lead Implant Date: 20210805
Implantable Lead Location: 753860
Implantable Pulse Generator Implant Date: 20210805
Lead Channel Impedance Value: 390 Ohm
Lead Channel Pacing Threshold Amplitude: 0.75 V
Lead Channel Pacing Threshold Pulse Width: 0.5 ms
Lead Channel Sensing Intrinsic Amplitude: 11.7 mV
Lead Channel Setting Pacing Amplitude: 2.5 V
Lead Channel Setting Pacing Pulse Width: 0.5 ms
Lead Channel Setting Sensing Sensitivity: 0.5 mV
Pulse Gen Serial Number: 111025604
Zone Setting Status: 755011

## 2024-03-16 ENCOUNTER — Ambulatory Visit: Payer: Self-pay | Admitting: Internal Medicine

## 2024-03-19 NOTE — Progress Notes (Unsigned)
 Electrophysiology Office Note:   Date:  03/20/2024  ID:  Richard Graham, DOB [birth year], MRN [id number]  Primary Cardiologist: Wilbert Bihari, MD Primary Heart Failure: None Electrophysiologist: Danelle Birmingham, MD      History of Present Illness:   Richard Graham is a 68 y.o. male with h/o coronary artery disease, atrial fibrillation, chronic systolic heart failure seen today for routine electrophysiology followup.   Atrial fibrillation heart failure diagnosed in 2021.  Echo showed an ejection fraction of 2025%.  Left heart catheterization showed occluded mid LAD with collaterals.  Cardiac MRI showed an ejection fraction of 22%.  He is on amiodarone .  He had an ICD shock January 2025 for rapid atrial fibrillation.  He presented to heart failure clinic 01/12/2024 in atrial flutter with rapid response.  Since last being seen in our clinic the patient reports doing for the most part well.  He does understand that he is in atrial fibrillation.  He feels weak, fatigued with mild shortness of breath.  Despite this, he continues to exercise.  he denies chest pain, palpitations, PND, orthopnea, nausea, vomiting, dizziness, syncope, edema, weight gain, or early satiety.   Review of systems complete and found to be negative unless listed in HPI.      EP Information / Studies Reviewed:    EKG is ordered today. Personal review as below.  EKG Interpretation Date/Time:  Tuesday March 20 2024 11:06:21 EDT Ventricular Rate:  111 PR Interval:    QRS Duration:  176 QT Interval:  426 QTC Calculation: 579 R Axis:   -73  Text Interpretation: Atrial fibrillation Right bundle branch block Left anterior fasicular block Septal infarct , age undetermined When compared with ECG of 21-Feb-2024 10:12, No significant change since last tracing Confirmed by Ashla Murph ([id number]) on 03/20/2024 12:11:20 PM   ICD Interrogation-  reviewed in detail today,  See PACEART report.  Device History: Abbott Single Chamber  ICD implanted  for chronic systolic heart failure History of appropriate therapy: No History of AAD therapy: Yes; currently on amiodarone    Risk Assessment/Calculations:    CHA2DS2-VASc Score = 3   This indicates a 3.2% annual risk of stroke. The patient's score is based upon: CHF History: 1 HTN History: 0 Diabetes History: 0 Stroke History: 0 Vascular Disease History: 1 Age Score: 1 Gender Score: 0            Physical Exam:   VS:  BP 116/68 (BP Location: Left Arm, Patient Position: Sitting, Cuff Size: Large)   Pulse (!) 111   Ht 6' 1 (1.854 m)   Wt 255 lb (115.7 kg)   SpO2 97%   BMI 33.64 kg/m    Wt Readings from Last 3 Encounters:  03/20/24 255 lb (115.7 kg)  02/24/24 268 lb 12.8 oz (121.9 kg)  02/21/24 268 lb (121.6 kg)     GEN: Well nourished, well developed in no acute distress NECK: No JVD; No carotid bruits CARDIAC: Irregularly irregular rate and rhythm, no murmurs, rubs, gallops RESPIRATORY:  Clear to auscultation without rales, wheezing or rhonchi  ABDOMEN: Soft, non-tender, non-distended EXTREMITIES:  No edema; No deformity   ASSESSMENT AND PLAN:    Chronic systolic dysfunction s/p Abbott single chamber ICD  euvolemic today Stable on an appropriate medical regimen Normal ICD function Sensing, threshold, impedance within normal limits Programming appropriate See Pace Art report No changes today  2.  Persistent atrial fibrillation/flutter: Post TEE cardioversion 01/17/2024.  On amiodarone .  He is continue to have episodes of atrial  fibrillation and flutter despite amiodarone .  He would prefer rhythm control.  Alizabeth Antonio plan for ablation.  Risk, benefits, and alternatives to EP study and radiofrequency/pulse field ablation for afib were also discussed in detail today. These risks include but are not limited to stroke, bleeding, vascular damage, tamponade, perforation, damage to the esophagus, lungs, and other structures, pulmonary vein stenosis, worsening renal  function, and death. The patient understands these risk and wishes to proceed.  We Devyon Keator therefore proceed with catheter ablation at the next available time.  Carto, ICE, anesthesia are requested for the procedure.  This patient Whitman Meinhardt require CT prior to ablation. To be scheduled.   3.  Secondary hypercoagulable state: Continue Eliquis   4.  High-risk medication monitoring: On amiodarone .  TSH and LFTs normal.  5.  Coronary artery disease: Occluded mid LAD with collaterals.  Continue management per primary cardiology  Disposition:   Follow up with Afib Clinic as usual post procedure   Signed, Calise Dunckel Gladis Norton, MD

## 2024-03-20 ENCOUNTER — Encounter: Payer: Self-pay | Admitting: Cardiology

## 2024-03-20 ENCOUNTER — Ambulatory Visit
Admission: RE | Admit: 2024-03-20 | Discharge: 2024-03-20 | Disposition: A | Discharge status: Home / Self Care | Source: Ambulatory Visit | Attending: Cardiology

## 2024-03-20 ENCOUNTER — Encounter: Payer: Self-pay | Admitting: *Deleted

## 2024-03-20 ENCOUNTER — Ambulatory Visit (INDEPENDENT_AMBULATORY_CARE_PROVIDER_SITE_OTHER): Admitting: Cardiology

## 2024-03-20 ENCOUNTER — Other Ambulatory Visit: Payer: Self-pay | Admitting: *Deleted

## 2024-03-20 ENCOUNTER — Ambulatory Visit (HOSPITAL_COMMUNITY): Payer: Self-pay | Admitting: Family Medicine

## 2024-03-20 VITALS — BP 116/68 | HR 111 | Ht 73.0 in | Wt 255.0 lb

## 2024-03-20 DIAGNOSIS — I110 Hypertensive heart disease with heart failure: Secondary | ICD-10-CM | POA: Insufficient documentation

## 2024-03-20 DIAGNOSIS — D6869 Other thrombophilia: Secondary | ICD-10-CM | POA: Insufficient documentation

## 2024-03-20 DIAGNOSIS — I4892 Unspecified atrial flutter: Secondary | ICD-10-CM | POA: Insufficient documentation

## 2024-03-20 DIAGNOSIS — Z5181 Encounter for therapeutic drug level monitoring: Secondary | ICD-10-CM | POA: Diagnosis not present

## 2024-03-20 DIAGNOSIS — Z7901 Long term (current) use of anticoagulants: Secondary | ICD-10-CM | POA: Diagnosis not present

## 2024-03-20 DIAGNOSIS — Z79899 Other long term (current) drug therapy: Secondary | ICD-10-CM | POA: Insufficient documentation

## 2024-03-20 DIAGNOSIS — I4819 Other persistent atrial fibrillation: Secondary | ICD-10-CM

## 2024-03-20 DIAGNOSIS — I2510 Atherosclerotic heart disease of native coronary artery without angina pectoris: Secondary | ICD-10-CM | POA: Diagnosis not present

## 2024-03-20 DIAGNOSIS — I5022 Chronic systolic (congestive) heart failure: Secondary | ICD-10-CM | POA: Diagnosis not present

## 2024-03-20 DIAGNOSIS — I5021 Acute systolic (congestive) heart failure: Secondary | ICD-10-CM

## 2024-03-20 DIAGNOSIS — Z95810 Presence of automatic (implantable) cardiac defibrillator: Secondary | ICD-10-CM | POA: Insufficient documentation

## 2024-03-20 DIAGNOSIS — I255 Ischemic cardiomyopathy: Secondary | ICD-10-CM

## 2024-03-20 DIAGNOSIS — Z01812 Encounter for preprocedural laboratory examination: Secondary | ICD-10-CM | POA: Diagnosis not present

## 2024-03-20 LAB — CUP PACEART INCLINIC DEVICE CHECK
Battery Remaining Longevity: 76 mo
Brady Statistic RV Percent Paced: 0.02 %
Date Time Interrogation Session: 20250805134218
HighPow Impedance: 78.75 Ohm
Implantable Lead Connection Status: 753985
Implantable Lead Implant Date: 20210805
Implantable Lead Location: 753860
Implantable Pulse Generator Implant Date: 20210805
Lead Channel Impedance Value: 450 Ohm
Lead Channel Pacing Threshold Amplitude: 1 V
Lead Channel Pacing Threshold Amplitude: 1 V
Lead Channel Pacing Threshold Pulse Width: 0.5 ms
Lead Channel Pacing Threshold Pulse Width: 0.5 ms
Lead Channel Sensing Intrinsic Amplitude: 11.7 mV
Lead Channel Setting Pacing Amplitude: 2.5 V
Lead Channel Setting Pacing Pulse Width: 0.5 ms
Lead Channel Setting Sensing Sensitivity: 0.5 mV
Pulse Gen Serial Number: 111025604
Zone Setting Status: 755011

## 2024-03-20 LAB — CBC
Hematocrit: 46.6 % (ref 37.5–51.0)
Hemoglobin: 15 g/dL (ref 13.0–17.7)
MCH: 30.9 pg (ref 26.6–33.0)
MCHC: 32.2 g/dL (ref 31.5–35.7)
MCV: 96 fL (ref 79–97)
Platelets: 197 x10E3/uL (ref 150–450)
RBC: 4.85 x10E6/uL (ref 4.14–5.80)
RDW: 13.6 % (ref 11.6–15.4)
WBC: 6.8 x10E3/uL (ref 3.4–10.8)

## 2024-03-20 LAB — BASIC METABOLIC PANEL WITH GFR
Anion gap: 10 (ref 5–15)
BUN: 19 mg/dL (ref 8–23)
CO2: 24 mmol/L (ref 22–32)
Calcium: 9.7 mg/dL (ref 8.9–10.3)
Chloride: 101 mmol/L (ref 98–111)
Creatinine, Ser: 1.24 mg/dL (ref 0.61–1.24)
GFR, Estimated: >60 mL/min (ref >60)
Glucose, Bld: 96 mg/dL (ref 70–99)
Potassium: 4.7 mmol/L (ref 3.5–5.1)
Sodium: 135 mmol/L (ref 135–145)

## 2024-03-20 LAB — BRAIN NATRIURETIC PEPTIDE: B Natriuretic Peptide: 156.1 pg/mL — ABNORMAL HIGH (ref 0.0–100.0)

## 2024-03-20 NOTE — Patient Instructions (Addendum)
 Medication Instructions:  Your physician recommends that you continue on your current medications as directed. Please refer to the Current Medication list given to you today.  *If you need a refill on your cardiac medications before your next appointment, please call your pharmacy*   Lab Work: Pre procedure labs today:  CBC  If you have a lab test that is abnormal and we need to change your treatment, we will call you to review the results -- otherwise no news is good news.    Testing/Procedures: Your physician has requested that you have cardiac CT 3 weeks PRIOR to your ablation. Cardiac computed tomography (CT) is a painless test that uses an x-ray machine to take clear, detailed pictures of your heart. We will contact you if the result is abnormal. See instruction letter  Your physician has recommended that you have an ablation. Catheter ablation is a medical procedure used to treat some cardiac arrhythmias (irregular heartbeats). During catheter ablation, a long, thin, flexible tube is put into a blood vessel in your groin (upper thigh), or neck. This tube is called an ablation catheter. It is then guided to your heart through the blood vessel. Radio frequency waves destroy small areas of heart tissue where abnormal heartbeats may cause an arrhythmia to start.   Your ablation is scheduled for 04/20/2024. Please arrive at [facility] at 5:30 am.  We will call/send instructions at a later date.   Follow-Up: At [facility], you and your health needs are our priority.  As part of our continuing mission to provide you with exceptional heart care, we have created designated Provider Care Teams.  These Care Teams include your primary Cardiologist (physician) and Advanced Practice Providers (APPs -  Physician Assistants and Nurse Practitioners) who all work together to provide you with the care you need, when you need it.  Your next appointment:   1 month(s) after your ablation  The  format for your next appointment:   In Person  Provider:   AFib clinic   Thank you for choosing [facility]!!   Maeola Domino, RN [phone]    Other Instructions   Cardiac Ablation Cardiac ablation is a procedure to destroy (ablate) some heart tissue that is sending bad signals. These bad signals cause problems in heart rhythm. The heart has many areas that make these signals. If there are problems in these areas, they can make the heart beat in a way that is not normal. Destroying some tissues can help make the heart rhythm normal. Tell your doctor about: Any allergies you have. All medicines you are taking. These include vitamins, herbs, eye drops, creams, and over-the-counter medicines. Any problems you or family members have had with medicines that make you fall asleep (anesthetics). Any blood disorders you have. Any surgeries you have had. Any medical conditions you have, such as kidney failure. Whether you are pregnant or may be pregnant. What are the risks? This is a safe procedure. But problems may occur, including: Infection. Bruising and bleeding. Bleeding into the chest. Stroke or blood clots. Damage to nearby areas of your body. Allergies to medicines or dyes. The need for a pacemaker if the normal system is damaged. Failure of the procedure to treat the problem. What happens before the procedure? Medicines Ask your doctor about: Changing or stopping your normal medicines. This is important. Taking aspirin  and ibuprofen. Do not take these medicines unless your doctor tells you to take them. Taking other medicines, vitamins, herbs, and supplements. General  instructions Follow instructions from your doctor about what you cannot eat or drink. Plan to have someone take you home from the hospital or clinic. If you will be going home right after the procedure, plan to have someone with you for 24 hours. Ask your doctor what steps will be taken to prevent  infection. What happens during the procedure?  An IV tube will be put into one of your veins. You will be given a medicine to help you relax. The skin on your neck or groin will be numbed. A cut (incision) will be made in your neck or groin. A needle will be put through your cut and into a large vein. A tube (catheter) will be put into the needle. The tube will be moved to your heart. Dye may be put through the tube. This helps your doctor see your heart. Small devices (electrodes) on the tube will send out signals. A type of energy will be used to destroy some heart tissue. The tube will be taken out. Pressure will be held on your cut. This helps stop bleeding. A bandage will be put over your cut. The exact procedure may vary among doctors and hospitals. What happens after the procedure? You will be watched until you leave the hospital or clinic. This includes checking your heart rate, breathing rate, oxygen, and blood pressure. Your cut will be watched for bleeding. You will need to lie still for a few hours. Do not drive for 24 hours or as long as your doctor tells you. Summary Cardiac ablation is a procedure to destroy some heart tissue. This is done to treat heart rhythm problems. Tell your doctor about any medical conditions you may have. Tell him or her about all medicines you are taking to treat them. This is a safe procedure. But problems may occur. These include infection, bruising, bleeding, and damage to nearby areas of your body. Follow what your doctor tells you about food and drink. You may also be told to change or stop some of your medicines. After the procedure, do not drive for 24 hours or as long as your doctor tells you. This information is not intended to replace advice given to you by your health care provider. Make sure you discuss any questions you have with your health care provider. Document Revised: 10/23/2021 Document Reviewed: 07/05/2019 Elsevier Patient  Education  2023 Else[employer] Inc[employer]   Cardiac Ablation, Care After  This sheet gives you information about how to care for yourself after your procedure. Your health care provider may also give you more specific instructions. If you have problems or questions, contact your health care provider. What can I expect after the procedure? After the procedure, it is common to have: Bruising around your puncture site. Tenderness around your puncture site. Skipped heartbeats. If you had an atrial fibrillation ablation, you may have atrial fibrillation during the first several months after your procedure.  Tiredness (fatigue).  Follow these instructions at home: Puncture site care  Follow instructions from your health care provider about how to take care of your puncture site. Make sure you: If present, leave stitches (sutures), skin glue, or adhesive strips in place. These skin closures may need to stay in place for up to 2 weeks. If adhesive strip edges start to loosen and curl up, you may trim the loose edges. Do not remove adhesive strips completely unless your health care provider tells you to do that. If a large square bandage is present, this may be  removed 24 hours after surgery.  Check your puncture site every day for signs of infection. Check for: Redness, swelling, or pain. Fluid or blood. If your puncture site starts to bleed, lie down on your back, apply firm pressure to the area, and contact your health care provider. Warmth. Pus or a bad smell. A pea or small marble sized lump at the site is normal and can take up to three months to resolve.  Driving Do not drive for at least 4 days after your procedure or however long your health care provider recommends. (Do not resume driving if you have previously been instructed not to drive for other health reasons.) Do not drive or use heavy machinery while taking prescription pain medicine. Activity Avoid activities that take a lot of effort for  at least 7 days after your procedure. Do not lift anything that is heavier than 5 lb (4.5 kg) for one week.  No sexual activity for 1 week.  Return to your normal activities as told by your health care provider. Ask your health care provider what activities are safe for you. General instructions Take over-the-counter and prescription medicines only as told by your health care provider. Do not use any products that contain nicotine or tobacco, such as cigarettes and e-cigarettes. If you need help quitting, ask your health care provider. You may shower after 24 hours, but Do not take baths, swim, or use a hot tub for 1 week.  Do not drink alcohol  for 24 hours after your procedure. Keep all follow-up visits as told by your health care provider. This is important. Contact a health care provider if: You have redness, mild swelling, or pain around your puncture site. You have fluid or blood coming from your puncture site that stops after applying firm pressure to the area. Your puncture site feels warm to the touch. You have pus or a bad smell coming from your puncture site. You have a fever. You have chest pain or discomfort that spreads to your neck, jaw, or arm. You have chest pain that is worse with lying on your back or taking a deep breath. You are sweating a lot. You feel nauseous. You have a fast or irregular heartbeat. You have shortness of breath. You are dizzy or light-headed and feel the need to lie down. You have pain or numbness in the arm or leg closest to your puncture site. Get help right away if: Your puncture site suddenly swells. Your puncture site is bleeding and the bleeding does not stop after applying firm pressure to the area. These symptoms may represent a serious problem that is an emergency. Do not wait to see if the symptoms will go away. Get medical help right away. Call your local emergency services (911 in the [place] Do not drive yourself to the  hospital. Summary After the procedure, it is normal to have bruising and tenderness at the puncture site in your groin, neck, or forearm. Check your puncture site every day for signs of infection. Get help right away if your puncture site is bleeding and the bleeding does not stop after applying firm pressure to the area. This is a medical emergency. This information is not intended to replace advice given to you by your health care provider. Make sure you discuss any questions you have with your health care provider.

## 2024-03-26 ENCOUNTER — Ambulatory Visit (HOSPITAL_COMMUNITY)
Admission: RE | Admit: 2024-03-26 | Discharge: 2024-03-26 | Disposition: A | Discharge status: Home / Self Care | Source: Ambulatory Visit | Attending: Cardiology | Admitting: Cardiology

## 2024-03-26 ENCOUNTER — Encounter (HOSPITAL_COMMUNITY)

## 2024-03-26 ENCOUNTER — Other Ambulatory Visit (HOSPITAL_COMMUNITY): Payer: Self-pay

## 2024-03-26 DIAGNOSIS — I4892 Unspecified atrial flutter: Secondary | ICD-10-CM | POA: Insufficient documentation

## 2024-03-26 DIAGNOSIS — I4819 Other persistent atrial fibrillation: Secondary | ICD-10-CM | POA: Diagnosis not present

## 2024-03-26 MED ORDER — IOHEXOL 350 MG/ML SOLN
80.0000 mL | Freq: Once | INTRAVENOUS | Status: AC | PRN
  Administered 2024-03-26 (×2): 80 mL via INTRAVENOUS

## 2024-03-28 ENCOUNTER — Other Ambulatory Visit (HOSPITAL_COMMUNITY): Payer: Self-pay

## 2024-03-29 ENCOUNTER — Ambulatory Visit (HOSPITAL_COMMUNITY): Payer: Self-pay | Admitting: Cardiology

## 2024-03-29 ENCOUNTER — Other Ambulatory Visit: Payer: Self-pay

## 2024-03-29 ENCOUNTER — Encounter (HOSPITAL_COMMUNITY): Payer: Self-pay | Admitting: Cardiology

## 2024-03-29 ENCOUNTER — Ambulatory Visit (HOSPITAL_COMMUNITY)
Admission: RE | Admit: 2024-03-29 | Discharge: 2024-03-29 | Disposition: A | Discharge status: Home / Self Care | Source: Ambulatory Visit | Attending: Cardiology | Admitting: Cardiology

## 2024-03-29 ENCOUNTER — Other Ambulatory Visit (HOSPITAL_COMMUNITY): Payer: Self-pay

## 2024-03-29 VITALS — BP 102/68 | HR 82 | Ht 73.0 in | Wt 255.2 lb

## 2024-03-29 DIAGNOSIS — I255 Ischemic cardiomyopathy: Secondary | ICD-10-CM | POA: Diagnosis not present

## 2024-03-29 DIAGNOSIS — Z7984 Long term (current) use of oral hypoglycemic drugs: Secondary | ICD-10-CM | POA: Diagnosis not present

## 2024-03-29 DIAGNOSIS — Z79899 Other long term (current) drug therapy: Secondary | ICD-10-CM | POA: Insufficient documentation

## 2024-03-29 DIAGNOSIS — G4733 Obstructive sleep apnea (adult) (pediatric): Secondary | ICD-10-CM | POA: Diagnosis not present

## 2024-03-29 DIAGNOSIS — I4819 Other persistent atrial fibrillation: Secondary | ICD-10-CM | POA: Diagnosis not present

## 2024-03-29 DIAGNOSIS — F32A Depression, unspecified: Secondary | ICD-10-CM | POA: Diagnosis not present

## 2024-03-29 DIAGNOSIS — I4510 Unspecified right bundle-branch block: Secondary | ICD-10-CM | POA: Insufficient documentation

## 2024-03-29 DIAGNOSIS — Z6833 Body mass index (BMI) 33.0-33.9, adult: Secondary | ICD-10-CM | POA: Diagnosis not present

## 2024-03-29 DIAGNOSIS — E669 Obesity, unspecified: Secondary | ICD-10-CM | POA: Insufficient documentation

## 2024-03-29 DIAGNOSIS — Z7901 Long term (current) use of anticoagulants: Secondary | ICD-10-CM | POA: Diagnosis not present

## 2024-03-29 DIAGNOSIS — I480 Paroxysmal atrial fibrillation: Secondary | ICD-10-CM | POA: Insufficient documentation

## 2024-03-29 DIAGNOSIS — I2510 Atherosclerotic heart disease of native coronary artery without angina pectoris: Secondary | ICD-10-CM | POA: Diagnosis not present

## 2024-03-29 DIAGNOSIS — Z87891 Personal history of nicotine dependence: Secondary | ICD-10-CM | POA: Diagnosis not present

## 2024-03-29 DIAGNOSIS — I5022 Chronic systolic (congestive) heart failure: Secondary | ICD-10-CM | POA: Insufficient documentation

## 2024-03-29 DIAGNOSIS — F419 Anxiety disorder, unspecified: Secondary | ICD-10-CM | POA: Insufficient documentation

## 2024-03-29 LAB — COMPREHENSIVE METABOLIC PANEL WITH GFR
ALT: 14 U/L (ref 0–44)
AST: 18 U/L (ref 15–41)
Albumin: 4.3 g/dL (ref 3.5–5.0)
Alkaline Phosphatase: 43 U/L (ref 38–126)
Anion gap: 9 (ref 5–15)
BUN: 18 mg/dL (ref 8–23)
CO2: 27 mmol/L (ref 22–32)
Calcium: 9.3 mg/dL (ref 8.9–10.3)
Chloride: 97 mmol/L — ABNORMAL LOW (ref 98–111)
Creatinine, Ser: 1.28 mg/dL — ABNORMAL HIGH (ref 0.61–1.24)
GFR, Estimated: >60 mL/min (ref >60)
Glucose, Bld: 104 mg/dL — ABNORMAL HIGH (ref 70–99)
Potassium: 4.8 mmol/L (ref 3.5–5.1)
Sodium: 133 mmol/L — ABNORMAL LOW (ref 135–145)
Total Bilirubin: 1 mg/dL (ref 0.0–1.2)
Total Protein: 7 g/dL (ref 6.5–8.1)

## 2024-03-29 LAB — TSH: TSH: 2.509 u[IU]/mL (ref 0.350–4.500)

## 2024-03-29 LAB — BRAIN NATRIURETIC PEPTIDE: B Natriuretic Peptide: 83.4 pg/mL (ref 0.0–100.0)

## 2024-03-29 MED ORDER — ROSUVASTATIN CALCIUM 20 MG PO TABS
20.0000 mg | ORAL_TABLET | Freq: Every day | ORAL | 6 refills | Status: DC
  Filled 2024-03-29: qty 30, 30d supply, fill #0
  Filled 2024-04-23: qty 30, 30d supply, fill #1
  Filled 2024-05-23: qty 30, 30d supply, fill #2
  Filled 2024-06-22: qty 30, 30d supply, fill #3
  Filled 2024-07-17: qty 30, 30d supply, fill #4
  Filled 2024-08-17: qty 30, 30d supply, fill #5

## 2024-03-29 MED ORDER — SPIRONOLACTONE 25 MG PO TABS
25.0000 mg | ORAL_TABLET | Freq: Every day | ORAL | 6 refills | Status: DC
  Filled 2024-03-29: qty 30, 30d supply, fill #0
  Filled 2024-05-09 – 2024-05-11 (×2): qty 30, 30d supply, fill #1
  Filled 2024-06-06 – 2024-06-11 (×2): qty 30, 30d supply, fill #2
  Filled 2024-07-09 – 2024-07-10 (×2): qty 30, 30d supply, fill #3
  Filled 2024-08-06: qty 30, 30d supply, fill #4
  Filled 2024-09-05: qty 30, 30d supply, fill #5

## 2024-03-29 MED ORDER — METOPROLOL SUCCINATE ER 100 MG PO TB24
100.0000 mg | ORAL_TABLET | Freq: Two times a day (BID) | ORAL | 6 refills | Status: DC
  Filled 2024-03-29: qty 60, 30d supply, fill #0
  Filled 2024-04-23: qty 60, 30d supply, fill #1
  Filled 2024-05-23: qty 60, 30d supply, fill #2
  Filled 2024-06-22: qty 60, 30d supply, fill #3
  Filled 2024-07-17 – 2024-07-20 (×2): qty 60, 30d supply, fill #4
  Filled 2024-08-17 – 2024-08-19 (×2): qty 60, 30d supply, fill #5

## 2024-03-29 MED ORDER — FUROSEMIDE 20 MG PO TABS
20.0000 mg | ORAL_TABLET | Freq: Two times a day (BID) | ORAL | 6 refills | Status: DC
  Filled 2024-03-29: qty 60, 30d supply, fill #0
  Filled 2024-04-23: qty 60, 30d supply, fill #1
  Filled 2024-05-23: qty 60, 30d supply, fill #2
  Filled 2024-06-22: qty 60, 30d supply, fill #3
  Filled 2024-07-17 – 2024-07-20 (×2): qty 60, 30d supply, fill #4
  Filled 2024-08-17 – 2024-08-19 (×2): qty 60, 30d supply, fill #5

## 2024-03-29 NOTE — Patient Instructions (Signed)
 Medication Changes:  INCREASE Metoprolol  XL to 100 mg Twice daily   INCREASE Spironolactone  to 25 mg (1 tab) Daily  INCREASE Crestor  to 20 mg Daily  Lab Work:  Labs done today, your results will be available in MyChart, we will contact you for abnormal readings.  Your physician recommends that you return for lab work in: 1-2 weeks  Special Instructions // Education:  Do the following things EVERYDAY: Weigh yourself in the morning before breakfast. Write it down and keep it in a log. Take your medicines as prescribed Eat low salt foods--Limit salt (sodium) to 2000 mg per day.  Stay as active as you can everyday Limit all fluids for the day to less than 2 liters   Follow-Up in: 1 month    At the [facility], you and your health needs are our priority. We have a designated team specialized in the treatment of Heart Failure. This Care Team includes your primary Heart Failure Specialized Cardiologist (physician), Advanced Practice Providers (APPs- Physician Assistants and Nurse Practitioners), and Pharmacist who all work together to provide you with the care you need, when you need it.   You may see any of the following providers on your designated Care Team at your next follow up:  Dr. Toribio Fuel Dr. Ezra Shuck Dr. Ria Commander Dr. Odis Brownie Greig Mosses, NP Caffie Shed, [place] [facility] [place], [place] Beckey Coe, NP [place] Lee, NP Tinnie Redman, PharmD   Please be sure to bring in all your medications bottles to every appointment.   Need to Contact Us :  If you have any questions or concerns before your next appointment please send us  a message through [place] or call our office at [phone].    TO LEAVE A MESSAGE FOR THE NURSE SELECT OPTION 2, PLEASE LEAVE A MESSAGE INCLUDING: YOUR NAME DATE OF BIRTH CALL BACK NUMBER REASON FOR CALL**this is important as we prioritize the call backs  YOU WILL RECEIVE A CALL BACK THE SAME  DAY AS LONG AS YOU CALL BEFORE 4:00 PM

## 2024-03-29 NOTE — Progress Notes (Signed)
 [facility] NOTE PCP: Valentin Skates, DO Cardiology: Dr. Shlomo HF Cardiology: Dr. Rolan Reason for Visit: Heart Failure and AFL Follow-up  HPI: 68 y.o. with history of CAD, PAF, and ischemic cardiomyopathy. Patent was admitted to [facility] in 3/21 with CHF and atrial fibrillation/RVR. Echo showed EF 20-25%.  LHC showing occluded mid LAD with collaterals.  CMRI showed EF 22%, apical aneurysm, no significant LAD territory viability; therefore, patient did not have CABG or PCI. Patient was additionally found to have right MCA aneurysm. This was treated with embolization in 4/21. In 5/21, he had DCCV back to NSR. Had remained in NSR on amiodarone . Repeat echo in 6/21 showed EF 20-25% with moderately decreased RV systolic function. St Jude ICD was placed. Patient has also been diagnosed with OSA and has started CPAP.   CPX 11/21 with no clear HF limitation.   Echo in 12/22 showed EF 35-40%, septal/apical akinesis, inferoapical dyskinesis, mild LV enlargement, RV normal, IVC normal.   He had an inappropriate ICD shock in 1/25 due to AF/RVR.   S/p successful TEE/DCCV on 01/17/24. TEE LV EF 30-35%, normal RV size/systolic function, mild-moderate TR, no LA appendage thrombus, trivial MR. Seen for post-procedure follow up 01/31/24 with recurrent AFL/RVR 122 bpm, asymptomatic. Referred to Afib Clinic and seen 02/21/24, he was referred to EP for ablation consultation.    Echo in 7/25 showed EF 30-35%, mild LVH, normal RV size/systolic function, IVC dilated.   He returns today for heart failure follow up. He is using CPAP.  He remains in atrial fibrillation today, rate is controlled. He does not feel palpitations. He is short of breath after walking about 1/2 mile. He is more limited by his knee pain.  No chest pain.  No orthopnea/PND.  Trying to avoid sodium.  Weight is down 13 lbs, now on tirzepatide . He goes to the [facility] and uses the elliptical.  Lasix  decreased to 20 mg bid due to AKI.   ECG (personally  reviewed): atrial fibrillation, RBBB, old ASMI  Labs (5/25): LDL 67 Labs (8/25): hgb 15, K 4.7, creatinine 1.24, BNP 156  Abbott ICD interrogation: no VT, stable thoracic impedance  PMH: 1. Cerebral aneurysm: Right MCA aneurysm, embolized in 4/21 by IR.  2. Atrial fibrillation: Paroxysmal.  - DCCV 5/21 - Inappropriate ICD shock in 1/25 for AF/RVR.  - DCCV 6/25 with ERAF 3. CAD: LHC (3/21) with totally occluded mid LAD with collaterals.  4. Chronic systolic CHF: Ischemic cardiomyopathy.   - Cardiac MRI (5/21): EF 22%, apical aneurysm, LAD territory scarring without evidence for significant viability. RV EF 31%.  - Echo (6/21): EF 20-25%, moderately decreased RV systolic function.  - St Jude ICD.  - CPX (11/21): peak VO2 17.9 (81% predicted), VE/VCO2 slope 25, RER 1.17 => no clear HF limitation.  - Echo (9/22): EF 35-40% with peri-apical akinesis and apical inferior aneurysm, no LV thrombus noted, mildly decreased RV systolic function, normal IVC.  - Echo (12/22): EF 35-40%, septal/apical akinesis, inferoapical dyskinesis, mild LV enlargement, RV normal, IVC normal - Echo (7/25): EF 30-35%, mild LVH, normal RV size/systolic function, IVC dilated.  5. Gout 6. OSA: Using CPAP.  7. Osteoarthritis 8. Depression/anxiety  Social History   Socioeconomic History   Marital status: Married    Spouse name: Not on file   Number of children: 0   Years of education: Not on file   Highest education level: Some college, no degree  Occupational History   Not on file  Tobacco Use  Smoking status: Former    Current packs/day: 0.00    Average packs/day: 0.3 packs/day for 3.0 years (0.8 ttl pk-yrs)    Types: Cigarettes    Start date: 08/16/2008    Quit date: 08/17/2011    Years since quitting: 12.6   Smokeless tobacco: Never   Tobacco comments:    Former smoker 02/21/24  Vaping Use   Vaping status: Never Used  Substance and Sexual Activity   Alcohol  use: Yes    Alcohol /week: 2.0 standard  drinks of alcohol     Types: 2 Glasses of wine per week   Drug use: No   Sexual activity: Yes  Other Topics Concern   Not on file  Social History Narrative   Not on file   Social Drivers of Health   Financial Resource Strain: Not on file  Food Insecurity: Not on file  Transportation Needs: Not on file  Physical Activity: Not on file  Stress: Not on file  Social Connections: Not on file  Intimate Partner Violence: Not on file   FH: No premature CAD or CHF.   ROS: All systems reviewed and negative except as per HPI.   Current Outpatient Medications  Medication Sig Dispense Refill   amiodarone  (PACERONE ) 200 MG tablet Take 200 mg by mouth 2 (two) times daily.     apixaban  (ELIQUIS ) 5 MG TABS tablet Take 1 tablet (5 mg total) by mouth 2 (two) times daily. 180 tablet 3   colchicine 0.6 MG tablet Take 0.6 mg by mouth 2 (two) times daily as needed (for gout flares).      empagliflozin  (JARDIANCE ) 10 MG TABS tablet Take 1 tablet (10 mg total) by mouth daily. 30 tablet 11   mupirocin ointment (BACTROBAN) 2 % Apply 1 Application topically 2 (two) times daily as needed (wound care).     oxyCODONE -acetaminophen  (PERCOCET/ROXICET) 5-325 MG tablet Take 1 tablet by mouth 4 (four) times daily.     sacubitril -valsartan  (ENTRESTO ) 49-51 MG Take 1 tablet by mouth 2 (two) times daily. 60 tablet 5   tadalafil (CIALIS) 20 MG tablet Take 20 mg by mouth daily as needed for erectile dysfunction.     tirzepatide  (ZEPBOUND ) 5 MG/0.5ML Pen Inject 5 mg into the skin once a week. 2 mL 0   furosemide  (LASIX ) 20 MG tablet Take 1 tablet (20 mg total) by mouth 2 (two) times daily. 60 tablet 6   metoprolol  succinate (TOPROL -XL) 100 MG 24 hr tablet Take 1 tablet (100 mg total) by mouth 2 (two) times daily. Take with or immediately following a meal 60 tablet 6   rosuvastatin  (CRESTOR ) 20 MG tablet Take 1 tablet (20 mg total) by mouth daily. 30 tablet 6   spironolactone  (ALDACTONE ) 25 MG tablet Take 1 tablet (25 mg  total) by mouth daily. 30 tablet 6   No current facility-administered medications for this encounter.   BP 102/68   Pulse 82   Ht 6' 1 (1.854 m)   Wt 115.8 kg (255 lb 3.2 oz)   SpO2 95%   BMI 33.67 kg/m   Wt Readings from Last 3 Encounters:  03/29/24 115.8 kg (255 lb 3.2 oz)  03/20/24 115.7 kg (255 lb)  02/24/24 121.9 kg (268 lb 12.8 oz)    PHYSICAL EXAM: General: NAD Neck: JVP 8 cm, no thyromegaly or thyroid  nodule.  Lungs: Clear to auscultation bilaterally with normal respiratory effort. CV: Nondisplaced PMI.  Heart irregular S1/S2, no S3/S4, no murmur.  No peripheral edema.  No carotid bruit.  Normal  pedal pulses.  Abdomen: Soft, nontender, no hepatosplenomegaly, no distention.  Skin: Intact without lesions or rashes.  Neurologic: Alert and oriented x 3.  Psych: Normal affect. Extremities: No clubbing or cyanosis.  HEENT: Normal.   Assessment/Plan: 1. CAD: LHC in 3/21 with occluded mid LAD with collaterals. CMRI was not suggestive of LAD territory viability, there was an apical aneurysm but no thrombus. Medical management. No chest pain.  - He is not on ASA with Eliquis   - Goal LDL < 55, increase rosuvastatin  to 20 mg daily. Lipids/LFTs in 2 months.   2. Atrial fibrillation/flutter: Persistent.  S/p DCCV to NSR in 5/21.  Inappropriate ICD shock for AF/RVR in 1/25.  S/p DCCV on 01/17/24 with ERAF.  He remains in atrial fibrillation today.  - Continue amio 200 mg bid until AFablation, check LFTs and TSH. Needs annual eye exam - Continue Eliquis  5 mg bid.   - Compliant with CPAP - Scheduled for AF ablation in September.  I will not cardiovert him again before that time.  3. Chronic systolic CHF: Ischemic cardiomyopathy.  Echo in 6/21 with EF 20-25%, moderately decreased RV systolic function.  St Jude ICD, not CRT candidate. CPX in 11/21 with minimal HF limitation. Echo in 12/22 showed EF 35-40%, apical inferior aneurysm. Echo in 7/25 showed EF 30-35%, mild LVH, normal RV  size/systolic function, IVC dilated. NYHA class II, not significantly volume overloaded by exam or [place].  - Continue Lasix  20 mg bid.  BMET/BNP today.  - Continue Entresto  49/51 bid.  - Increase spironolactone  back to 25 mg daily.  BMET in 10 days.  - Continue Farxiga  10 mg daily.   - Increase Toprol  XL to 100 mg bid.   4. Right MCA aneurysm: S/p embolization.  5. OSA: reports compliance w/ CPAP. Continue 6. Depression/anxiety:   - Continue sertraline . Managed by PCP  7. Obesity: Body mass index is 33.67 kg/m. - Now on Zepbound . Losing weight. Congratulated  Follow up in 1 month with APP.   I spent 31 minutes reviewing records, interviewing/examining patient, and managing orders.   Ezra Shuck, MD 03/29/2024

## 2024-04-02 ENCOUNTER — Other Ambulatory Visit: Payer: Self-pay

## 2024-04-02 ENCOUNTER — Other Ambulatory Visit (HOSPITAL_COMMUNITY): Payer: Self-pay

## 2024-04-03 ENCOUNTER — Other Ambulatory Visit (HOSPITAL_COMMUNITY): Payer: Self-pay

## 2024-04-04 ENCOUNTER — Other Ambulatory Visit (HOSPITAL_COMMUNITY): Payer: Self-pay

## 2024-04-04 DIAGNOSIS — M170 Bilateral primary osteoarthritis of knee: Secondary | ICD-10-CM | POA: Diagnosis not present

## 2024-04-05 ENCOUNTER — Other Ambulatory Visit: Payer: Self-pay

## 2024-04-05 ENCOUNTER — Other Ambulatory Visit (HOSPITAL_COMMUNITY): Payer: Self-pay

## 2024-04-05 ENCOUNTER — Encounter: Payer: Self-pay | Admitting: Pharmacist

## 2024-04-05 MED ORDER — ZEPBOUND 7.5 MG/0.5ML ~~LOC~~ SOAJ
7.5000 mg | SUBCUTANEOUS | 0 refills | Status: DC
  Filled 2024-04-05: qty 2, 28d supply, fill #0

## 2024-04-11 ENCOUNTER — Other Ambulatory Visit (HOSPITAL_COMMUNITY): Payer: Self-pay

## 2024-04-11 ENCOUNTER — Encounter (HOSPITAL_COMMUNITY): Payer: Self-pay | Admitting: Cardiology

## 2024-04-11 DIAGNOSIS — M170 Bilateral primary osteoarthritis of knee: Secondary | ICD-10-CM | POA: Diagnosis not present

## 2024-04-12 ENCOUNTER — Other Ambulatory Visit (HOSPITAL_COMMUNITY)

## 2024-04-13 ENCOUNTER — Encounter (HOSPITAL_COMMUNITY): Payer: Self-pay

## 2024-04-18 ENCOUNTER — Ambulatory Visit (HOSPITAL_COMMUNITY)
Admission: RE | Admit: 2024-04-18 | Discharge: 2024-04-18 | Disposition: A | Discharge status: Home / Self Care | Source: Ambulatory Visit | Attending: Cardiology | Admitting: Cardiology

## 2024-04-18 DIAGNOSIS — I5022 Chronic systolic (congestive) heart failure: Secondary | ICD-10-CM | POA: Diagnosis not present

## 2024-04-18 DIAGNOSIS — M1711 Unilateral primary osteoarthritis, right knee: Secondary | ICD-10-CM | POA: Diagnosis not present

## 2024-04-18 DIAGNOSIS — M1712 Unilateral primary osteoarthritis, left knee: Secondary | ICD-10-CM | POA: Diagnosis not present

## 2024-04-18 DIAGNOSIS — M170 Bilateral primary osteoarthritis of knee: Secondary | ICD-10-CM | POA: Diagnosis not present

## 2024-04-18 LAB — BASIC METABOLIC PANEL WITH GFR
Anion gap: 12 (ref 5–15)
BUN: 23 mg/dL (ref 8–23)
CO2: 22 mmol/L (ref 22–32)
Calcium: 9.2 mg/dL (ref 8.9–10.3)
Chloride: 99 mmol/L (ref 98–111)
Creatinine, Ser: 1.4 mg/dL — ABNORMAL HIGH (ref 0.61–1.24)
GFR, Estimated: 55 mL/min — ABNORMAL LOW (ref >60)
Glucose, Bld: 99 mg/dL (ref 70–99)
Potassium: 5.1 mmol/L (ref 3.5–5.1)
Sodium: 133 mmol/L — ABNORMAL LOW (ref 135–145)

## 2024-04-19 NOTE — Pre-Procedure Instructions (Signed)
 Instructed patient on the following items: Arrival time 0515 Nothing to eat or drink after midnight No meds AM of procedure Responsible person to drive you home and stay with you for 24 hrs  Have you missed any doses of anti-coagulant Eliquis- takes twice a day, hasn't missed any doses in last 4 weeks.  Don't take dose morning of procedure.

## 2024-04-20 ENCOUNTER — Ambulatory Visit (HOSPITAL_COMMUNITY): Admitting: Anesthesiology

## 2024-04-20 ENCOUNTER — Encounter (HOSPITAL_COMMUNITY)
Admission: RE | Disposition: A | Discharge status: Home / Self Care | Payer: Self-pay | Source: Home / Self Care | Attending: Cardiology

## 2024-04-20 ENCOUNTER — Ambulatory Visit (HOSPITAL_COMMUNITY)
Admission: RE | Admit: 2024-04-20 | Discharge: 2024-04-20 | Disposition: A | Discharge status: Home / Self Care | Attending: Cardiology | Admitting: Cardiology

## 2024-04-20 ENCOUNTER — Other Ambulatory Visit: Payer: Self-pay

## 2024-04-20 DIAGNOSIS — I4891 Unspecified atrial fibrillation: Secondary | ICD-10-CM | POA: Diagnosis not present

## 2024-04-20 DIAGNOSIS — I5022 Chronic systolic (congestive) heart failure: Secondary | ICD-10-CM

## 2024-04-20 DIAGNOSIS — G4730 Sleep apnea, unspecified: Secondary | ICD-10-CM | POA: Diagnosis not present

## 2024-04-20 DIAGNOSIS — I483 Typical atrial flutter: Secondary | ICD-10-CM

## 2024-04-20 DIAGNOSIS — I2510 Atherosclerotic heart disease of native coronary artery without angina pectoris: Secondary | ICD-10-CM | POA: Insufficient documentation

## 2024-04-20 DIAGNOSIS — E119 Type 2 diabetes mellitus without complications: Secondary | ICD-10-CM | POA: Diagnosis not present

## 2024-04-20 DIAGNOSIS — I110 Hypertensive heart disease with heart failure: Secondary | ICD-10-CM

## 2024-04-20 DIAGNOSIS — I4819 Other persistent atrial fibrillation: Secondary | ICD-10-CM

## 2024-04-20 DIAGNOSIS — Z87891 Personal history of nicotine dependence: Secondary | ICD-10-CM | POA: Insufficient documentation

## 2024-04-20 DIAGNOSIS — G4733 Obstructive sleep apnea (adult) (pediatric): Secondary | ICD-10-CM

## 2024-04-20 HISTORY — PX: ATRIAL FIBRILLATION ABLATION: EP1191

## 2024-04-20 LAB — CBC
HCT: 43.3 % (ref 39.0–52.0)
Hemoglobin: 14.2 g/dL (ref 13.0–17.0)
MCH: 31.4 pg (ref 26.0–34.0)
MCHC: 32.8 g/dL (ref 30.0–36.0)
MCV: 95.8 fL (ref 80.0–100.0)
Platelets: 179 K/uL (ref 150–400)
RBC: 4.52 MIL/uL (ref 4.22–5.81)
RDW: 14.4 % (ref 11.5–15.5)
WBC: 10 K/uL (ref 4.0–10.5)
nRBC: 0 % (ref 0.0–0.2)

## 2024-04-20 LAB — GLUCOSE, CAPILLARY
Glucose-Capillary: 108 mg/dL — ABNORMAL HIGH (ref 70–99)
Glucose-Capillary: 106 mg/dL — ABNORMAL HIGH (ref 70–99)

## 2024-04-20 LAB — POCT ACTIVATED CLOTTING TIME: Activated Clotting Time: 228 s

## 2024-04-20 MED ORDER — SODIUM CHLORIDE 0.9% FLUSH: 3.0000 mL | INTRAVENOUS | Status: DC | PRN

## 2024-04-20 MED ORDER — ROCURONIUM BROMIDE 10 MG/ML (PF) SYRINGE
PREFILLED_SYRINGE | INTRAVENOUS | Status: DC | PRN
  Administered 2024-04-20: 60 mg via INTRAVENOUS
  Administered 2024-04-20: 20 mg via INTRAVENOUS

## 2024-04-20 MED ORDER — FENTANYL CITRATE (PF) 100 MCG/2ML IJ SOLN
INTRAMUSCULAR | Status: AC
  Filled 2024-04-20: qty 2

## 2024-04-20 MED ORDER — ONDANSETRON HCL 4 MG/2ML IJ SOLN
INTRAMUSCULAR | Status: DC | PRN
Start: 2024-04-20 — End: 2024-04-20
  Administered 2024-04-20: 4 mg via INTRAVENOUS

## 2024-04-20 MED ORDER — VASOPRESSIN 20 UNIT/ML IV SOLN
INTRAVENOUS | Status: DC | PRN
  Administered 2024-04-20 (×3): 1 [IU] via INTRAVENOUS

## 2024-04-20 MED ORDER — ALBUMIN HUMAN 5 % IV SOLN: INTRAVENOUS | Status: DC | PRN

## 2024-04-20 MED ORDER — ACETAMINOPHEN 325 MG PO TABS: 650.0000 mg | ORAL_TABLET | ORAL | Status: DC | PRN

## 2024-04-20 MED ORDER — CEFAZOLIN SODIUM-DEXTROSE 2-3 GM-%(50ML) IV SOLR
INTRAVENOUS | Status: DC | PRN
  Administered 2024-04-20: 2 g via INTRAVENOUS

## 2024-04-20 MED ORDER — LIDOCAINE 2% (20 MG/ML) 5 ML SYRINGE
INTRAMUSCULAR | Status: DC | PRN
  Administered 2024-04-20: 100 mg via INTRAVENOUS

## 2024-04-20 MED ORDER — FENTANYL CITRATE (PF) 100 MCG/2ML IJ SOLN
INTRAMUSCULAR | Status: AC
Start: 2024-04-20 — End: 2024-04-20
  Filled 2024-04-20: qty 2

## 2024-04-20 MED ORDER — ATROPINE SULFATE 1 MG/10ML IJ SOSY
PREFILLED_SYRINGE | INTRAMUSCULAR | Status: DC | PRN
  Administered 2024-04-20: 1 mg via INTRAVENOUS

## 2024-04-20 MED ORDER — ATROPINE SULFATE 1 MG/10ML IJ SOSY
PREFILLED_SYRINGE | INTRAMUSCULAR | Status: AC
  Filled 2024-04-20: qty 10

## 2024-04-20 MED ORDER — CEFAZOLIN SODIUM-DEXTROSE 2-4 GM/100ML-% IV SOLN
INTRAVENOUS | Status: AC
  Filled 2024-04-20: qty 100

## 2024-04-20 MED ORDER — PROPOFOL 10 MG/ML IV BOLUS
INTRAVENOUS | Status: DC | PRN
  Administered 2024-04-20: 40 mg via INTRAVENOUS
  Administered 2024-04-20: 60 mg via INTRAVENOUS

## 2024-04-20 MED ORDER — SODIUM CHLORIDE 0.9 % IV SOLN: 250.0000 mL | INTRAVENOUS | Status: DC | PRN

## 2024-04-20 MED ORDER — OXYCODONE-ACETAMINOPHEN 5-325 MG PO TABS
1.0000 | ORAL_TABLET | Freq: Once | ORAL | Status: AC
  Administered 2024-04-20: 1 via ORAL
  Filled 2024-04-20: qty 1

## 2024-04-20 MED ORDER — FENTANYL CITRATE (PF) 100 MCG/2ML IJ SOLN
25.0000 ug | INTRAMUSCULAR | Status: DC | PRN
  Administered 2024-04-20: 50 ug via INTRAVENOUS

## 2024-04-20 MED ORDER — NOREPINEPHRINE 4 MG/250ML-% IV SOLN
INTRAVENOUS | Status: DC | PRN
  Administered 2024-04-20: 4 ug/min via INTRAVENOUS

## 2024-04-20 MED ORDER — SUGAMMADEX SODIUM 200 MG/2ML IV SOLN
INTRAVENOUS | Status: DC | PRN
  Administered 2024-04-20: 200 mg via INTRAVENOUS

## 2024-04-20 MED ORDER — FENTANYL CITRATE (PF) 100 MCG/2ML IJ SOLN
INTRAMUSCULAR | Status: DC | PRN
  Administered 2024-04-20 (×2): 50 ug via INTRAVENOUS

## 2024-04-20 MED ORDER — HEPARIN SODIUM (PORCINE) 1000 UNIT/ML IJ SOLN
INTRAMUSCULAR | Status: DC | PRN
  Administered 2024-04-20: 9000 [IU] via INTRAVENOUS
  Administered 2024-04-20: 14000 [IU] via INTRAVENOUS

## 2024-04-20 MED ORDER — MIDAZOLAM HCL 2 MG/2ML IJ SOLN
INTRAMUSCULAR | Status: DC | PRN
Start: 2024-04-20 — End: 2024-04-20
  Administered 2024-04-20: 2 mg via INTRAVENOUS

## 2024-04-20 MED ORDER — DEXAMETHASONE SODIUM PHOSPHATE 10 MG/ML IJ SOLN
INTRAMUSCULAR | Status: DC | PRN
  Administered 2024-04-20: 10 mg via INTRAVENOUS

## 2024-04-20 MED ORDER — ALBUMIN HUMAN 5 % IV SOLN
INTRAVENOUS | Status: AC
  Filled 2024-04-20: qty 250

## 2024-04-20 MED ORDER — HEPARIN SODIUM (PORCINE) 1000 UNIT/ML IJ SOLN
INTRAMUSCULAR | Status: AC
  Filled 2024-04-20: qty 20

## 2024-04-20 MED ORDER — PROTAMINE SULFATE 10 MG/ML IV SOLN
INTRAVENOUS | Status: DC | PRN
Start: 2024-04-20 — End: 2024-04-20
  Administered 2024-04-20: 40 mg via INTRAVENOUS

## 2024-04-20 MED ORDER — MIDAZOLAM HCL 2 MG/2ML IJ SOLN
INTRAMUSCULAR | Status: AC
Start: 2024-04-20 — End: 2024-04-20
  Filled 2024-04-20: qty 2

## 2024-04-20 MED ORDER — SODIUM CHLORIDE 0.9 % IV SOLN: INTRAVENOUS | Status: DC

## 2024-04-20 MED ORDER — ONDANSETRON HCL 4 MG/2ML IJ SOLN
4.0000 mg | Freq: Four times a day (QID) | INTRAMUSCULAR | Status: DC | PRN
Start: 2024-04-20 — End: 2024-04-20

## 2024-04-20 MED ORDER — HEPARIN (PORCINE) IN NACL 1000-0.9 UT/500ML-% IV SOLN
INTRAVENOUS | Status: DC | PRN
  Administered 2024-04-20 (×3): 500 mL

## 2024-04-20 NOTE — Discharge Instructions (Signed)

## 2024-04-20 NOTE — H&P (Signed)
  Electrophysiology Office Note:   Date:  04/20/2024  ID:  Richard Graham, DOB [birth year], MRN [id number]  Primary Cardiologist: Wilbert Bihari, MD Primary Heart Failure: None Electrophysiologist: Danelle Birmingham, MD      History of Present Illness:   Richard Graham is a 68 y.o. male with h/o coronary artery disease, atrial fibrillation, chronic systolic heart failure seen today for routine electrophysiology followup.   Atrial fibrillation heart failure diagnosed in 2021.  Echo showed an ejection fraction of 2025%.  Left heart catheterization showed occluded mid LAD with collaterals.  Cardiac MRI showed an ejection fraction of 22%.  He is on amiodarone .  He had an ICD shock January 2025 for rapid atrial fibrillation.  He presented to heart failure clinic 01/12/2024 in atrial flutter with rapid response.  Today, denies symptoms of palpitations, chest pain, dyspnea, orthopnea, PND, lower extremity edema, claudication, dizziness, presyncope, syncope, bleeding, or neurologic sequela. The patient is tolerating medications without difficulties. Plan ablation today.      EP Information / Studies Reviewed:    EKG is ordered today. Personal review as below.      ICD Interrogation-  reviewed in detail today,  See PACEART report.  Device History: Abbott Single Chamber ICD implanted  for chronic systolic heart failure History of appropriate therapy: No History of AAD therapy: Yes; currently on amiodarone    Risk Assessment/Calculations:    CHA2DS2-VASc Score = 3   This indicates a 3.2% annual risk of stroke. The patient's score is based upon: CHF History: 1 HTN History: 0 Diabetes History: 0 Stroke History: 0 Vascular Disease History: 1 Age Score: 1 Gender Score: 0            Physical Exam:   VS:  BP 97/67   Pulse 75   Temp 98.2 F (36.8 C)   Resp 18   Ht 6' 1 (1.854 m)   Wt 120.2 kg   SpO2 97%   BMI 34.96 kg/m    Wt Readings from Last 3 Encounters:  04/20/24 120.2 kg   03/29/24 115.8 kg  03/20/24 115.7 kg    GEN: Well nourished, well developed in no acute distress NECK: No JVD; No carotid bruits CARDIAC: Regular rate and rhythm, no murmurs, rubs, gallops RESPIRATORY:  Clear to auscultation without rales, wheezing or rhonchi  ABDOMEN: Soft, non-tender, non-distended EXTREMITIES:  No edema; No deformity    ASSESSMENT AND PLAN:    Chronic systolic dysfunction s/p Abbott single chamber ICD  euvolemic today Stable on an appropriate medical regimen Normal ICD function Sensing, threshold, impedance within normal limits Programming appropriate See Pace Art report No changes today  2.  Persistent atrial fibrillation/flutter: Richard Graham has presented today for surgery, with the diagnosis of AF.  The various methods of treatment have been discussed with the patient and family. After consideration of risks, benefits and other options for treatment, the patient has consented to  Procedure(s): Catheter ablation as a surgical intervention .  Risks include but not limited to complete heart block, stroke, esophageal damage, nerve damage, bleeding, vascular damage, tamponade, perforation, MI, and death. The patient's history has been reviewed, patient examined, no change in status, stable for surgery.  I have reviewed the patient's chart and labs.  Questions were answered to the patient's satisfaction.    Matti Minney Inocencio, MD 04/20/2024 7:05 AM

## 2024-04-20 NOTE — Anesthesia Preprocedure Evaluation (Addendum)
 Anesthesia Evaluation  Patient identified by MRN, date of birth, ID band Patient awake    Reviewed: Allergy & Precautions, NPO status , Patient's Chart, lab work & pertinent test results  Airway Mallampati: II  TM Distance: >3 FB Neck ROM: Full    Dental  (+) Dental Advisory Given   Pulmonary sleep apnea , former smoker   breath sounds clear to auscultation       Cardiovascular hypertension, Pt. on medications and Pt. on home beta blockers + CAD and +CHF  + dysrhythmias Atrial Fibrillation + pacemaker + Cardiac Defibrillator  Rhythm:Regular Rate:Normal  1. Left ventricular ejection fraction, by estimation, is 30 to 35%. The  left ventricle has moderately decreased function. The left ventricle  demonstrates regional wall motion abnormalities (see scoring  diagram/findings for description). There is mild  left ventricular hypertrophy. Left ventricular diastolic parameters are  indeterminate.   2. Right ventricular systolic function is normal. The right ventricular  size is normal. Tricuspid regurgitation signal is inadequate for assessing  PA pressure.   3. Left atrial size was mildly dilated.   4. The mitral valve is grossly normal. Trivial mitral valve  regurgitation. No evidence of mitral stenosis.   5. The aortic valve was not well visualized. Aortic valve regurgitation  is not visualized. No aortic stenosis is present.   6. The inferior vena cava is dilated in size with <50% respiratory  variability, suggesting right atrial pressure of 15 mmHg.      Neuro/Psych Aneurysm s/p coiling    GI/Hepatic negative GI ROS, Neg liver ROS,,,  Endo/Other  diabetes, Type 2    Renal/GU Renal InsufficiencyRenal disease     Musculoskeletal   Abdominal   Peds  Hematology negative hematology ROS (+)   Anesthesia Other Findings   Reproductive/Obstetrics                              Anesthesia  Physical Anesthesia Plan  ASA: 3  Anesthesia Plan: General   Post-op Pain Management: Ofirmev  IV (intra-op)*   Induction: Intravenous  PONV Risk Score and Plan: 2 and Dexamethasone , Ondansetron , Midazolam  and Treatment may vary due to age or medical condition  Airway Management Planned: Oral ETT  Additional Equipment:   Intra-op Plan:   Post-operative Plan: Extubation in OR  Informed Consent: I have reviewed the patients History and Physical, chart, labs and discussed the procedure including the risks, benefits and alternatives for the proposed anesthesia with the patient or authorized representative who has indicated his/her understanding and acceptance.     Dental advisory given  Plan Discussed with: CRNA  Anesthesia Plan Comments:          Anesthesia Quick Evaluation

## 2024-04-20 NOTE — Progress Notes (Signed)
 Patient and wife was given discharge instructions. Both verbalized understanding.

## 2024-04-20 NOTE — Transfer of Care (Signed)
 Immediate Anesthesia Transfer of Care Note  Patient: Richard Graham  Procedure(s) Performed: ATRIAL FIBRILLATION ABLATION  Patient Location: Cath Lab  Anesthesia Type:General  Level of Consciousness: awake, alert , oriented, and patient cooperative  Airway & Oxygen Therapy: Patient Spontanous Breathing and Patient connected to nasal cannula oxygen  Post-op Assessment: Report given to RN, Post -op Vital signs reviewed and stable, and Post -op Vital signs reviewed and unstable, Anesthesiologist notified--patient remains hypotensive. Albumin  given by CRNA in PACU. MDA called to make aware. Patient awake and mentating appropriately. Patient reports to having a chronic low BP. MDA will consult.  Post vital signs: Reviewed and stable  Last Vitals:  Vitals Value Taken Time  BP 76/58 04/20/24 09:35  Temp    Pulse 63 04/20/24 09:39  Resp 13 04/20/24 09:39  SpO2 96 % 04/20/24 09:39  Vitals shown include unfiled device data.  Last Pain:  Vitals:   04/20/24 0546  PainSc: 0-No pain      Patients Stated Pain Goal: 5 (04/20/24 0546)  Complications: There were no known notable events for this encounter.

## 2024-04-20 NOTE — Anesthesia Procedure Notes (Signed)
 Procedure Name: Intubation Date/Time: 04/20/2024 7:50 AM  Performed by: Cindie Donald CROME, CRNAPre-anesthesia Checklist: Patient identified, Emergency Drugs available, Suction available and Patient being monitored Patient Re-evaluated:Patient Re-evaluated prior to induction Oxygen Delivery Method: Circle System Utilized Preoxygenation: Pre-oxygenation with 100% oxygen Induction Type: IV induction Ventilation: Mask ventilation without difficulty and Two handed mask ventilation required Laryngoscope Size: Mac and 4 Grade View: Grade I Tube type: Oral Tube size: 7.5 mm Number of attempts: 1 Airway Equipment and Method: Stylet Placement Confirmation: ETT inserted through vocal cords under direct vision, positive ETCO2 and breath sounds checked- equal and bilateral Secured at: 23 cm Tube secured with: Tape Dental Injury: Teeth and Oropharynx as per pre-operative assessment

## 2024-04-21 ENCOUNTER — Telehealth: Payer: Self-pay | Admitting: Physician Assistant

## 2024-04-21 NOTE — Telephone Encounter (Signed)
 Patient contacted after-hours answering service.  He was just discharged yesterday after A-fib ablation.  He complains of groin pain.  He denies any bulge in the groin area.  He took a total of 4 tablet of 500 mg Tylenol  yesterday.  I instructed the patient to take at 1000 mg of Tylenol  up to 3 times a day for pain control.  He has been instructed to contact us  if there is a significant bulge in the groin area or his pain suddenly worsens

## 2024-04-22 ENCOUNTER — Encounter (HOSPITAL_COMMUNITY): Payer: Self-pay | Admitting: Cardiology

## 2024-04-23 ENCOUNTER — Other Ambulatory Visit (HOSPITAL_COMMUNITY): Payer: Self-pay

## 2024-04-23 ENCOUNTER — Telehealth (HOSPITAL_COMMUNITY): Payer: Self-pay

## 2024-04-23 ENCOUNTER — Other Ambulatory Visit: Payer: Self-pay | Admitting: Cardiology

## 2024-04-23 ENCOUNTER — Other Ambulatory Visit: Payer: Self-pay

## 2024-04-23 MED ORDER — ZEPBOUND 7.5 MG/0.5ML ~~LOC~~ SOAJ
7.5000 mg | SUBCUTANEOUS | 0 refills | Status: DC
  Filled 2024-04-23 – 2024-04-26 (×2): qty 2, 28d supply, fill #0

## 2024-04-23 MED FILL — Cefazolin Sodium-Dextrose IV Solution 2 GM/100ML-4%: INTRAVENOUS | Qty: 100 | Status: AC

## 2024-04-23 NOTE — Telephone Encounter (Signed)
 Spoke with patient to complete post procedure follow up call.  Patient reports no complications with groin sites, but did call the after-hours line for groin pain and expressed disappointment with the call and that he was not given a Rx for pain medication because he has a low pain tolerance. He denied any additional symptoms. Advised patient that is normal to have some tenderness that should gradually improve and it is typically recommended to take Tylenol , no more than 3000 mg daily. He can take 1000 mg up to 3 times daily as previously recommended. Suggested patient to also alternate applying a cool and warm compress to groin area to help with discomfort as well. He voiced understanding and stated the pain is slightly better today.   Instructions reviewed with patient:  It is normal to have bruising, tenderness, mild swelling, and a pea or marble sized lump/knot at the groin site which can take up to three months to resolve.  Get help right away if you notice sudden swelling at the puncture site.  Check your puncture site every day for signs of infection: fever, redness, swelling, pus drainage, warmth, foul odor or excessive pain. If this occurs, please call [phone], to speak with the RN Navigator. Get help right away if your puncture site is bleeding and the bleeding does not stop after applying firm pressure to the area.  You may continue to have skipped beats/ atrial fibrillation during the first several months after your procedure.  It is very important not to miss any doses of your blood thinner Eliquis .    You will follow up with the Afib clinic on 05/18/24 and follow up with Dr.Will Camnitz on 07/20/24.   Patient verbalized understanding to all instructions provided and appreciated the call.

## 2024-04-24 ENCOUNTER — Other Ambulatory Visit (HOSPITAL_COMMUNITY): Payer: Self-pay

## 2024-04-25 DIAGNOSIS — I4819 Other persistent atrial fibrillation: Secondary | ICD-10-CM | POA: Diagnosis not present

## 2024-04-26 ENCOUNTER — Other Ambulatory Visit: Payer: Self-pay

## 2024-04-26 ENCOUNTER — Other Ambulatory Visit (HOSPITAL_COMMUNITY): Payer: Self-pay

## 2024-04-26 NOTE — Progress Notes (Addendum)
 ADVANCED HF CLINIC NOTE PCP: Valentin Skates, DO Cardiology: Dr. Shlomo HF Cardiology: Dr. Rolan Reason for Visit: Heart Failure and AFL Follow-up  HPI: 68 y.o. with history of CAD, PAF, and ischemic cardiomyopathy. Patent was admitted to [facility] in 3/21 with CHF and atrial fibrillation/RVR. Echo showed EF 20-25%.  LHC showing occluded mid LAD with collaterals.  CMRI showed EF 22%, apical aneurysm, no significant LAD territory viability; therefore, patient did not have CABG or PCI. Patient was additionally found to have right MCA aneurysm. This was treated with embolization in 4/21. In 5/21, he had DCCV back to NSR. Repeat echo in 6/21 showed EF 20-25% with moderately decreased RV systolic function. St Jude ICD was placed. Patient has also been diagnosed with OSA and has started CPAP.   CPX 11/21 with no clear HF limitation.   Echo in 12/22 showed EF 35-40%, septal/apical akinesis, inferoapical dyskinesis, mild LV enlargement, RV normal, IVC normal.   He had an inappropriate ICD shock in 1/25 due to AF/RVR.   S/p successful TEE/DCCV on 01/17/24. TEE LV EF 30-35%, normal RV size/systolic function, mild-moderate TR, no LA appendage thrombus, trivial MR. Seen for post-procedure follow up 01/31/24 with recurrent AFL/RVR 122 bpm, asymptomatic. Referred to Afib Clinic and seen 02/21/24, he was referred to EP for ablation consultation.    Echo in 7/25 showed EF 30-35%, mild LVH, normal RV size/systolic function, IVC dilated.   He underwent Afib ablation by Dr. Inocencio 04/20/24.  He is here today for CHF follow-up. He has been doing well from cardiac perspective. No dyspnea, orthopnea, PND or lower extremity edema. He has lost 30 lb since July with dietary changes and use of Zepbound . He is planning to resume working out at [employer] using the [employer] and [employer]. Denies chest pain. Using CPAP nightly.   Drinks a glass of red wine on occasion. Occasionally has 1 cigarette when playing  cards with a group. No regular tobacco.  PMH: 1. Cerebral aneurysm: Right MCA aneurysm, embolized in 4/21 by IR.  2. Atrial fibrillation: Paroxysmal.  - DCCV 5/21 - Inappropriate ICD shock in 1/25 for AF/RVR.  - DCCV 6/25 with ERAF - Afib ablation 09/25 3. CAD: LHC (3/21) with totally occluded mid LAD with collaterals.  4. Chronic systolic CHF: Ischemic cardiomyopathy.   - Cardiac MRI (5/21): EF 22%, apical aneurysm, LAD territory scarring without evidence for significant viability. RV EF 31%.  - Echo (6/21): EF 20-25%, moderately decreased RV systolic function.  - St Jude ICD.  - CPX (11/21): peak VO2 17.9 (81% predicted), VE/VCO2 slope 25, RER 1.17 => no clear HF limitation.  - Echo (9/22): EF 35-40% with peri-apical akinesis and apical inferior aneurysm, no LV thrombus noted, mildly decreased RV systolic function, normal IVC.  - Echo (12/22): EF 35-40%, septal/apical akinesis, inferoapical dyskinesis, mild LV enlargement, RV normal, IVC normal - Echo (7/25): EF 30-35%, mild LVH, normal RV size/systolic function, IVC dilated.  5. Gout 6. OSA: Using CPAP.  7. Osteoarthritis 8. Depression/anxiety  Social History   Socioeconomic History   Marital status: Married    Spouse name: Not on file   Number of children: 0   Years of education: Not on file   Highest education level: Some college, no degree  Occupational History   Not on file  Tobacco Use   Smoking status: Former    Current packs/day: 0.00    Average packs/day: 0.3 packs/day for 3.0 years (0.8 ttl pk-yrs)    Types: Cigarettes  Start date: 08/16/2008    Quit date: 08/17/2011    Years since quitting: 12.7   Smokeless tobacco: Never   Tobacco comments:    Former smoker 02/21/24  Vaping Use   Vaping status: Never Used  Substance and Sexual Activity   Alcohol  use: Yes    Alcohol /week: 2.0 standard drinks of alcohol     Types: 2 Glasses of wine per week   Drug use: No   Sexual activity: Yes  Other Topics Concern   Not  on file  Social History Narrative   Not on file   Social Drivers of Health   Financial Resource Strain: Not on file  Food Insecurity: Not on file  Transportation Needs: Not on file  Physical Activity: Not on file  Stress: Not on file  Social Connections: Not on file  Intimate Partner Violence: Not on file   FH: No premature CAD or CHF.   ROS: All systems reviewed and negative except as per HPI.   Current Outpatient Medications  Medication Sig Dispense Refill   amiodarone  (PACERONE ) 200 MG tablet Take 200 mg by mouth 2 (two) times daily.     apixaban  (ELIQUIS ) 5 MG TABS tablet Take 1 tablet (5 mg total) by mouth 2 (two) times daily. 180 tablet 3   colchicine 0.6 MG tablet Take 0.6 mg by mouth 2 (two) times daily as needed (for gout flares).      empagliflozin  (JARDIANCE ) 10 MG TABS tablet Take 1 tablet (10 mg total) by mouth daily. 30 tablet 11   furosemide  (LASIX ) 20 MG tablet Take 1 tablet (20 mg total) by mouth 2 (two) times daily. 60 tablet 6   metoprolol  succinate (TOPROL -XL) 100 MG 24 hr tablet Take 1 tablet (100 mg total) by mouth 2 (two) times daily. Take with or immediately following a meal 60 tablet 6   mupirocin ointment (BACTROBAN) 2 % Apply 1 Application topically 2 (two) times daily as needed (wound care).     oxyCODONE -acetaminophen  (PERCOCET/ROXICET) 5-325 MG tablet Take 1 tablet by mouth every 8 (eight) hours as needed for moderate pain (pain score 4-6) or severe pain (pain score 7-10).     rosuvastatin  (CRESTOR ) 20 MG tablet Take 1 tablet (20 mg total) by mouth daily. 30 tablet 6   sacubitril -valsartan  (ENTRESTO ) 49-51 MG Take 1 tablet by mouth 2 (two) times daily. 60 tablet 5   spironolactone  (ALDACTONE ) 25 MG tablet Take 1 tablet (25 mg total) by mouth daily. 30 tablet 6   tadalafil (CIALIS) 20 MG tablet Take 20 mg by mouth daily as needed for erectile dysfunction.     tirzepatide  (ZEPBOUND ) 7.5 MG/0.5ML Pen Inject 7.5 mg into the skin once a week. 2 mL 0   No  current facility-administered medications for this encounter.   BP (!) 100/58   Pulse 71   Ht 6' 1 (1.854 m)   Wt 110.9 kg (244 lb 6.4 oz)   SpO2 95%   BMI 32.24 kg/m   Wt Readings from Last 3 Encounters:  04/30/24 110.9 kg (244 lb 6.4 oz)  04/20/24 120.2 kg (265 lb)  03/29/24 115.8 kg (255 lb 3.2 oz)    PHYSICAL EXAM: General:  Well appearing Neck: JVP flat Rnm:Mzhlojm rate & rhythm. No rubs, gallops or murmurs. Lungs: clear Abdomen: soft, nontender, nondistended.  Extremities: no edema Neuro: alert & orientedx3. Affect pleasant   Abbott device interrogation. No VT/VF, thoracic impedance at threshold  ECG today: SR 1st degree AVB 64 bpm, RBBB  Assessment/Plan: 1. CAD:  LHC in 3/21 with occluded mid LAD with collaterals. CMRI was not suggestive of LAD territory viability, there was an apical aneurysm but no thrombus. Medical management. No chest pain. - He is not on ASA with Eliquis   - Goal LDL < 55, increased rosuvastatin  to 20 mg daily last visit. Lipid panel at f/u visit. 2. Atrial fibrillation/flutter: Paroxysmal.  S/p DCCV to NSR in 5/21.  Inappropriate ICD shock for AF/RVR in 1/25.  S/p DCCV on 01/17/24 with ERAF.  S/p AF ablation in 09/25. - Continue amio 200 mg bid, TSH and LFTs okay in August. Needs annual eye exam. Hopefully he can come off Amiodarone  in the coming months. He is in SR today. Has follow-up with EP. - Continue Eliquis  5 mg bid.   - Compliant with CPAP 3. Chronic systolic CHF: Ischemic cardiomyopathy.  Echo in 6/21 with EF 20-25%, moderately decreased RV systolic function.  St Jude ICD. CPX in 11/21 with minimal HF limitation. Echo in 12/22 showed EF 35-40%, apical inferior aneurysm. Echo in 7/25 showed EF 30-35%, mild LVH, normal RV size/systolic function, IVC dilated. NYHA class II, volume looks good today - Continue Lasix  20 mg bid.  BNP down on labs last month. BMET 09/03 with stable Scr at 1.4, K 5.1.  - Continue Entresto  49/51 bid.  - Continue  spironolactone  25 mg daily.   - Continue Farxiga  10 mg daily.   - Continue Toprol  XL 100 mg bid.   - Repeat echo at fu/u in 3 months to see if EF improves with maintenance of SR. If EF remains reduced does he qualify for CRT? Does not have LBBB, but does have RBBB with wide QRS. 4. Right MCA aneurysm: S/p embolization.  5. OSA: Reports compliance with CPAP. Continue. 6. Depression/anxiety:   - Continue sertraline . Managed by PCP  7. Obesity: Body mass index is 32.24 kg/m. - Now on Zepbound . Congratuled him on weight loss. Continue lifestyle measures w/ diet and regular exercise.   Follow-up Dr. Rolan 3 months with echo   [facility], MANUELITA SAILOR, PA-C 04/30/2024

## 2024-04-27 ENCOUNTER — Other Ambulatory Visit (HOSPITAL_COMMUNITY): Payer: Self-pay

## 2024-04-30 ENCOUNTER — Encounter (HOSPITAL_COMMUNITY): Payer: Self-pay

## 2024-04-30 ENCOUNTER — Telehealth: Payer: Self-pay | Admitting: Cardiology

## 2024-04-30 ENCOUNTER — Ambulatory Visit (HOSPITAL_COMMUNITY)
Admission: RE | Admit: 2024-04-30 | Discharge: 2024-04-30 | Disposition: A | Discharge status: Home / Self Care | Source: Ambulatory Visit | Attending: Physician Assistant | Admitting: Physician Assistant

## 2024-04-30 VITALS — BP 100/58 | HR 71 | Ht 73.0 in | Wt 244.4 lb

## 2024-04-30 DIAGNOSIS — Z95810 Presence of automatic (implantable) cardiac defibrillator: Secondary | ICD-10-CM | POA: Insufficient documentation

## 2024-04-30 DIAGNOSIS — I4892 Unspecified atrial flutter: Secondary | ICD-10-CM | POA: Insufficient documentation

## 2024-04-30 DIAGNOSIS — I480 Paroxysmal atrial fibrillation: Secondary | ICD-10-CM | POA: Diagnosis not present

## 2024-04-30 DIAGNOSIS — Z7901 Long term (current) use of anticoagulants: Secondary | ICD-10-CM | POA: Insufficient documentation

## 2024-04-30 DIAGNOSIS — I5022 Chronic systolic (congestive) heart failure: Secondary | ICD-10-CM | POA: Insufficient documentation

## 2024-04-30 DIAGNOSIS — I2510 Atherosclerotic heart disease of native coronary artery without angina pectoris: Secondary | ICD-10-CM | POA: Insufficient documentation

## 2024-04-30 DIAGNOSIS — F32A Depression, unspecified: Secondary | ICD-10-CM | POA: Insufficient documentation

## 2024-04-30 DIAGNOSIS — F419 Anxiety disorder, unspecified: Secondary | ICD-10-CM | POA: Diagnosis not present

## 2024-04-30 DIAGNOSIS — I255 Ischemic cardiomyopathy: Secondary | ICD-10-CM | POA: Insufficient documentation

## 2024-04-30 DIAGNOSIS — Z87891 Personal history of nicotine dependence: Secondary | ICD-10-CM | POA: Insufficient documentation

## 2024-04-30 DIAGNOSIS — Z79899 Other long term (current) drug therapy: Secondary | ICD-10-CM | POA: Diagnosis not present

## 2024-04-30 DIAGNOSIS — E669 Obesity, unspecified: Secondary | ICD-10-CM | POA: Insufficient documentation

## 2024-04-30 DIAGNOSIS — G4733 Obstructive sleep apnea (adult) (pediatric): Secondary | ICD-10-CM | POA: Insufficient documentation

## 2024-04-30 DIAGNOSIS — Z6832 Body mass index (BMI) 32.0-32.9, adult: Secondary | ICD-10-CM | POA: Diagnosis not present

## 2024-04-30 DIAGNOSIS — I4510 Unspecified right bundle-branch block: Secondary | ICD-10-CM | POA: Diagnosis not present

## 2024-04-30 DIAGNOSIS — Z7984 Long term (current) use of oral hypoglycemic drugs: Secondary | ICD-10-CM | POA: Insufficient documentation

## 2024-04-30 NOTE — Addendum Note (Signed)
 Encounter addended by: Colletta Manuelita Garre, PA-C on: 04/30/2024 1:40 PM  Actions taken: Clinical Note Signed

## 2024-04-30 NOTE — Telephone Encounter (Signed)
 Left detailed message apologizing for the conflicting information.  Advised pt he does NOT have any restrictions and to return to normal activities.  Advised the does NOT have weight limit restrictions either.  Advised to call back to office if we need to further talk about this.

## 2024-04-30 NOTE — Anesthesia Postprocedure Evaluation (Signed)
 Anesthesia Post Note  Patient: Richard Graham  Procedure(s) Performed: ATRIAL FIBRILLATION ABLATION     Patient location during evaluation: PACU Anesthesia Type: General Level of consciousness: awake and alert Pain management: pain level controlled Vital Signs Assessment: post-procedure vital signs reviewed and stable Respiratory status: spontaneous breathing, nonlabored ventilation, respiratory function stable and patient connected to nasal cannula oxygen Cardiovascular status: blood pressure returned to baseline and stable Postop Assessment: no apparent nausea or vomiting Anesthetic complications: no   There were no known notable events for this encounter.  Last Vitals:  Vitals:   04/20/24 1200 04/20/24 1230  BP: (!) 83/60 (!) 77/57  Pulse: 66 68  Resp: 19 (!) 21  Temp:    SpO2: 95% 92%    Last Pain:  Vitals:   04/20/24 1116  TempSrc:   PainSc: 7    Pain Goal: Patients Stated Pain Goal: 3 (04/20/24 1025)                 Epifanio Lamar BRAVO

## 2024-04-30 NOTE — Telephone Encounter (Signed)
Patient was calling back. Please advise  

## 2024-04-30 NOTE — Patient Instructions (Addendum)
 No medication changes today. Return to see Dr. Rolan in 3 months with echo. See below. Please call us  at [phone] if any questions or concerns prior to your next appointment.    Dr. Shelton Address: [address]  [place], [place] [address] Phone: [phone]

## 2024-04-30 NOTE — Telephone Encounter (Signed)
 Patient had an ablation last Friday and wants to speak with the nurse. States he has some questions but does not have any issues that he needs addressed.

## 2024-04-30 NOTE — Telephone Encounter (Signed)
 Patient would like to know today if [facility] and [facility] the elliptical machine and keep up his mobility.  Ablation performed on 04/20/24. He reports ablation site looks good and is doing well.  Spoke with EP nurse in triage and it was advised he take it easy but could use the elliptical machine just be careful not to go too hard/get his heart too excited. She also advised patient stick to 5 lb weight limit for now until his F/U appt with Dr. Inocencio. It was recommended he try the treadmill rather than the elliptical. Patient states it's more exertion for him to use the treadmill, with the elliptical he can sit and pedal/move his arms. He states he will not use the resistance and keep activity light.  Patient verbalized understanding and expressed appreciation.

## 2024-05-02 ENCOUNTER — Telehealth (HOSPITAL_COMMUNITY): Payer: Self-pay | Admitting: Cardiology

## 2024-05-02 NOTE — Telephone Encounter (Signed)
 Patient called to question when RX for viagra would be sent to [facility] pharmacy. Reports this was discussed at most recent OV however pharmacy does not have meds    Please advise

## 2024-05-02 NOTE — Telephone Encounter (Signed)
 Disregard message Pt reports he did not leave VM and does not need meds at this time

## 2024-05-03 ENCOUNTER — Other Ambulatory Visit (HOSPITAL_COMMUNITY): Payer: Self-pay

## 2024-05-03 DIAGNOSIS — Z8739 Personal history of other diseases of the musculoskeletal system and connective tissue: Secondary | ICD-10-CM | POA: Diagnosis not present

## 2024-05-03 DIAGNOSIS — Z7901 Long term (current) use of anticoagulants: Secondary | ICD-10-CM | POA: Diagnosis not present

## 2024-05-03 DIAGNOSIS — Z4789 Encounter for other orthopedic aftercare: Secondary | ICD-10-CM | POA: Diagnosis not present

## 2024-05-03 DIAGNOSIS — M7021 Olecranon bursitis, right elbow: Secondary | ICD-10-CM | POA: Diagnosis not present

## 2024-05-08 ENCOUNTER — Other Ambulatory Visit: Payer: Self-pay

## 2024-05-09 ENCOUNTER — Other Ambulatory Visit (HOSPITAL_COMMUNITY): Payer: Self-pay

## 2024-05-11 ENCOUNTER — Other Ambulatory Visit: Payer: Self-pay

## 2024-05-16 NOTE — Progress Notes (Signed)
 Remote ICD Transmission

## 2024-05-18 ENCOUNTER — Ambulatory Visit (HOSPITAL_COMMUNITY): Admitting: Physician Assistant

## 2024-05-23 ENCOUNTER — Other Ambulatory Visit (HOSPITAL_COMMUNITY): Payer: Self-pay

## 2024-05-23 ENCOUNTER — Ambulatory Visit (HOSPITAL_COMMUNITY): Admitting: Physician Assistant

## 2024-05-23 ENCOUNTER — Other Ambulatory Visit: Payer: Self-pay

## 2024-05-28 ENCOUNTER — Other Ambulatory Visit: Payer: Self-pay

## 2024-06-05 ENCOUNTER — Ambulatory Visit (HOSPITAL_COMMUNITY): Admitting: Physician Assistant

## 2024-06-06 ENCOUNTER — Other Ambulatory Visit: Payer: Self-pay | Admitting: Cardiology

## 2024-06-06 ENCOUNTER — Other Ambulatory Visit (HOSPITAL_COMMUNITY): Payer: Self-pay

## 2024-06-07 ENCOUNTER — Other Ambulatory Visit: Payer: Self-pay

## 2024-06-07 ENCOUNTER — Other Ambulatory Visit (HOSPITAL_COMMUNITY): Payer: Self-pay

## 2024-06-07 ENCOUNTER — Other Ambulatory Visit: Payer: Self-pay | Admitting: Cardiology

## 2024-06-07 ENCOUNTER — Encounter (HOSPITAL_COMMUNITY): Payer: Self-pay

## 2024-06-07 MED ORDER — ZEPBOUND 10 MG/0.5ML ~~LOC~~ SOAJ
10.0000 mg | SUBCUTANEOUS | 0 refills | Status: DC
  Filled 2024-06-07: qty 2, 28d supply, fill #0

## 2024-06-07 MED ORDER — AMIODARONE HCL 200 MG PO TABS
200.0000 mg | ORAL_TABLET | Freq: Every day | ORAL | 3 refills | Status: AC
  Filled 2024-06-07: qty 90, 90d supply, fill #0
  Filled 2024-09-02: qty 90, 90d supply, fill #1

## 2024-06-08 ENCOUNTER — Other Ambulatory Visit: Payer: Self-pay

## 2024-06-11 ENCOUNTER — Other Ambulatory Visit: Payer: Self-pay

## 2024-06-14 ENCOUNTER — Ambulatory Visit (HOSPITAL_COMMUNITY): Admitting: Physician Assistant

## 2024-06-14 ENCOUNTER — Ambulatory Visit: Payer: BC Managed Care – PPO

## 2024-06-14 DIAGNOSIS — I480 Paroxysmal atrial fibrillation: Secondary | ICD-10-CM | POA: Diagnosis not present

## 2024-06-18 LAB — CUP PACEART REMOTE DEVICE CHECK
Battery Remaining Longevity: 74 mo
Battery Remaining Percentage: 61 %
Battery Voltage: 2.96 V
Brady Statistic RV Percent Paced: 1 %
Date Time Interrogation Session: 20251101152631
HighPow Impedance: 80 Ohm
Implantable Lead Connection Status: 753985
Implantable Lead Implant Date: 20210805
Implantable Lead Location: 753860
Implantable Pulse Generator Implant Date: 20210805
Lead Channel Impedance Value: 410 Ohm
Lead Channel Pacing Threshold Amplitude: 1 V
Lead Channel Pacing Threshold Pulse Width: 0.5 ms
Lead Channel Sensing Intrinsic Amplitude: 11.7 mV
Lead Channel Setting Pacing Amplitude: 2.5 V
Lead Channel Setting Pacing Pulse Width: 0.5 ms
Lead Channel Setting Sensing Sensitivity: 0.5 mV
Pulse Gen Serial Number: 111025604
Zone Setting Status: 755011

## 2024-06-20 NOTE — Progress Notes (Signed)
 Remote ICD Transmission

## 2024-06-21 ENCOUNTER — Ambulatory Visit (HOSPITAL_COMMUNITY): Admitting: Physician Assistant

## 2024-06-21 ENCOUNTER — Ambulatory Visit: Payer: Self-pay | Admitting: Internal Medicine

## 2024-06-22 ENCOUNTER — Other Ambulatory Visit: Payer: Self-pay

## 2024-06-22 ENCOUNTER — Other Ambulatory Visit (HOSPITAL_COMMUNITY): Payer: Self-pay

## 2024-06-25 ENCOUNTER — Other Ambulatory Visit: Payer: Self-pay

## 2024-07-05 ENCOUNTER — Ambulatory Visit (HOSPITAL_COMMUNITY): Admitting: Physician Assistant

## 2024-07-05 NOTE — Progress Notes (Signed)
 MANUS WEEDMAN                                          MRN: [id number]   07/05/2024   The VBCI Quality Team Specialist reviewed this patient medical record for the purposes of chart review for care gap closure. The following were reviewed: chart review for care gap closure-controlling blood pressure.    VBCI Quality Team

## 2024-07-07 ENCOUNTER — Other Ambulatory Visit (HOSPITAL_COMMUNITY): Payer: Self-pay

## 2024-07-09 ENCOUNTER — Other Ambulatory Visit: Payer: Self-pay

## 2024-07-09 ENCOUNTER — Other Ambulatory Visit: Payer: Self-pay | Admitting: Pharmacist

## 2024-07-09 ENCOUNTER — Telehealth: Payer: Self-pay | Admitting: Pharmacist

## 2024-07-09 ENCOUNTER — Other Ambulatory Visit (HOSPITAL_COMMUNITY): Payer: Self-pay

## 2024-07-09 NOTE — Telephone Encounter (Signed)
 Patient is asking if he can up to the next dosage of tirzepatide  (ZEPBOUND ) 10 MG/0.5ML Pen

## 2024-07-10 ENCOUNTER — Other Ambulatory Visit: Payer: Self-pay

## 2024-07-11 ENCOUNTER — Other Ambulatory Visit (HOSPITAL_COMMUNITY): Payer: Self-pay

## 2024-07-11 ENCOUNTER — Other Ambulatory Visit: Payer: Self-pay

## 2024-07-11 ENCOUNTER — Ambulatory Visit (HOSPITAL_COMMUNITY)
Admission: RE | Admit: 2024-07-11 | Discharge: 2024-07-11 | Disposition: A | Discharge status: Home / Self Care | Source: Ambulatory Visit | Attending: Physician Assistant | Admitting: Physician Assistant

## 2024-07-11 VITALS — BP 92/62 | HR 71 | Ht 73.0 in | Wt 223.8 lb

## 2024-07-11 DIAGNOSIS — Z5181 Encounter for therapeutic drug level monitoring: Secondary | ICD-10-CM | POA: Diagnosis not present

## 2024-07-11 DIAGNOSIS — D6869 Other thrombophilia: Secondary | ICD-10-CM

## 2024-07-11 DIAGNOSIS — I483 Typical atrial flutter: Secondary | ICD-10-CM | POA: Diagnosis not present

## 2024-07-11 DIAGNOSIS — I4891 Unspecified atrial fibrillation: Secondary | ICD-10-CM

## 2024-07-11 DIAGNOSIS — Z79899 Other long term (current) drug therapy: Secondary | ICD-10-CM

## 2024-07-11 DIAGNOSIS — I4819 Other persistent atrial fibrillation: Secondary | ICD-10-CM

## 2024-07-11 MED ORDER — ZEPBOUND 12.5 MG/0.5ML ~~LOC~~ SOAJ
12.5000 mg | SUBCUTANEOUS | 0 refills | Status: DC
  Filled 2024-07-11: qty 2, 28d supply, fill #0

## 2024-07-11 NOTE — Progress Notes (Signed)
 Primary Care Physician: Valentin Skates, DO Primary Cardiologist: Wilbert Bihari, MD Electrophysiologist: Danelle Birmingham, MD  [facility]: Dr Rolan Referring Physician: Laymon Pour PA   Richard Graham is a 68 y.o. male with a history of CAD, CHF s/p ICD, cerebral aneurysm s/p embolization, OSA, atrial flutter, atrial fibrillation who presents for follow up in the [facility].  The patient has been maintained on amiodarone . He was found to be in atrial flutter at his visit 01/12/24 and underwent DCCV on 01/17/24. However, he was back in atrial flutter by his visit on 01/31/24. Patient is on Eliquis  for stroke prevention.    Patient returns for follow up for atrial fibrillation and amiodarone  monitoring. He remains in SR today and feels great. He denies any interim symptoms of afib. He denies chest pain or groin issues. No bleeding issues on anticoagulation.   Today, he  denies symptoms of palpitations, chest pain, shortness of breath, orthopnea, PND, lower extremity edema, dizziness, presyncope, syncope, snoring, daytime somnolence, bleeding, or neurologic sequela. The patient is tolerating medications without difficulties and is otherwise without complaint today.    Atrial Fibrillation Risk Factors:  he does have symptoms or diagnosis of sleep apnea. he does not have a history of rheumatic fever. he does not have a history of alcohol  use. The patient does not have a history of early familial atrial fibrillation or other arrhythmias.  Atrial Fibrillation Management history:  Previous antiarrhythmic drugs: amiodarone   Previous cardioversions: 12/28/19, 01/17/24 Previous ablations: 04/20/24 Anticoagulation history: Eliquis   ROS- All systems are reviewed and negative except as per the HPI above.  Past Medical History:  Diagnosis Date   AICD (automatic cardioverter/defibrillator) present    CAD (coronary artery disease), native coronary artery    chronically  occluded mid LAD with left to left collaterals.    Cerebral aneurysm    s/p embolization   Chronic systolic (congestive) heart failure (HCC)    biventricular HF with moderate RV dysfunction and severe LV dysfunction by Cardiac MRI with EF 22% no viability in the apical septal/apical/inferior,mid anterior and true apex.     Diabetes mellitus without complication (HCC)    on Jardiance    Gout    HLD (hyperlipidemia)    PAF (paroxysmal atrial fibrillation) (HCC)    s/p DCCV 12/2019   Pre-diabetes    diet controlled, no meds   Presence of permanent cardiac pacemaker    Gallant/Abbott Pacemaker, last remote check 10/19/21   Sleep apnea    use cpap nightly    Current Outpatient Medications  Medication Sig Dispense Refill   amiodarone  (PACERONE ) 200 MG tablet Take 1 tablet (200 mg total) by mouth daily. 90 tablet 3   apixaban  (ELIQUIS ) 5 MG TABS tablet Take 1 tablet (5 mg total) by mouth 2 (two) times daily. 180 tablet 3   colchicine 0.6 MG tablet Take 0.6 mg by mouth 2 (two) times daily as needed (for gout flares).      empagliflozin  (JARDIANCE ) 10 MG TABS tablet Take 1 tablet (10 mg total) by mouth daily. 30 tablet 11   furosemide  (LASIX ) 20 MG tablet Take 1 tablet (20 mg total) by mouth 2 (two) times daily. 60 tablet 6   Guaifenesin (MUCINEX MAXIMUM STRENGTH) 1200 MG TB12 Take 1 tablet by mouth every morning.     metoprolol  succinate (TOPROL -XL) 100 MG 24 hr tablet Take 1 tablet (100 mg total) by mouth 2 (two) times daily. Take with or immediately following a meal 60 tablet 6  mupirocin ointment (BACTROBAN) 2 % Apply 1 Application topically 2 (two) times daily as needed (wound care).     oxyCODONE -acetaminophen  (PERCOCET/ROXICET) 5-325 MG tablet Take 1 tablet by mouth every 8 (eight) hours as needed for moderate pain (pain score 4-6) or severe pain (pain score 7-10).     rosuvastatin  (CRESTOR ) 20 MG tablet Take 1 tablet (20 mg total) by mouth daily. 30 tablet 6   sacubitril -valsartan   (ENTRESTO ) 49-51 MG Take 1 tablet by mouth 2 (two) times daily. 60 tablet 5   spironolactone  (ALDACTONE ) 25 MG tablet Take 1 tablet (25 mg total) by mouth daily. 30 tablet 6   tadalafil (CIALIS) 20 MG tablet Take 20 mg by mouth daily as needed for erectile dysfunction. (Patient taking differently: Take 20 mg by mouth as needed for erectile dysfunction.)     tirzepatide  (ZEPBOUND ) 12.5 MG/0.5ML Pen Inject 12.5 mg into the skin once a week. 2 mL 0   No current facility-administered medications for this encounter.    Physical Exam: BP 92/62   Pulse 71   Ht 6' 1 (1.854 m)   Wt 101.5 kg   BMI 29.53 kg/m   GEN: Well nourished, well developed in no acute distress CARDIAC: Regular rate and rhythm, no murmurs, rubs, gallops RESPIRATORY:  Clear to auscultation without rales, wheezing or rhonchi  ABDOMEN: Soft, non-tender, non-distended EXTREMITIES:  No edema; No deformity    Wt Readings from Last 3 Encounters:  07/11/24 101.5 kg  04/30/24 110.9 kg  04/20/24 120.2 kg     EKG Interpretation Date/Time:  Wednesday July 11 2024 15:07:06 EST Ventricular Rate:  71 PR Interval:  212 QRS Duration:  172 QT Interval:  468 QTC Calculation: 508 R Axis:   -77  Text Interpretation: Sinus rhythm with 1st degree A-V block Right bundle branch block Left anterior fascicular block Bifascicular block Septal infarct (cited on or before 20-Dec-2019) Abnormal ECG When compared with ECG of 30-Apr-2024 09:56, No significant change was found Confirmed by Jonpaul Lumm ([id number]) on 07/11/2024 3:29:58 PM    Echo 07/23/21 demonstrated   1. Septal and apical akinesis no mural apical thrombus Inferior apical  dyskinesis EF and RWMAls similar to echo done 04/22/21. Left ventricular  ejection fraction, by estimation, is 35 to 40%. The left ventricle has  moderately decreased function. The left ventricle demonstrates regional wall motion abnormalities (see scoring diagram/findings for description). The left  ventricular internal cavity size was mildly dilated. There is mild asymmetric left ventricular hypertrophy of the basal and septal segments. Left ventricular diastolic parameters are consistent with Grade I diastolic dysfunction (impaired relaxation).   2. AICD wires in RA/RV. Right ventricular systolic function is normal.  The right ventricular size is normal. There is normal pulmonary artery  systolic pressure.   3. The mitral valve is normal in structure. No evidence of mitral valve  regurgitation. No evidence of mitral stenosis.   4. The aortic valve is normal in structure. Aortic valve regurgitation is  not visualized. No aortic stenosis is present.   5. The inferior vena cava is normal in size with greater than 50%  respiratory variability, suggesting right atrial pressure of 3 mmHg.     CHA2DS2-VASc Score = 3  The patient's score is based upon: CHF History: 1 HTN History: 0 Diabetes History: 0 Stroke History: 0 Vascular Disease History: 1 Age Score: 1 Gender Score: 0       ASSESSMENT AND PLAN: Persistent Atrial Fibrillation/atrial flutter (ICD10:  I48.19) The patient's CHA2DS2-VASc score is 3,  indicating a 3.2% annual risk of stroke.   S/p afib and flutter ablation 04/20/24 Patient appears to be maintaining SR Continue amiodarone  200 mg daily for now. Continue Eliquis  5 mg BID Continue Toprol  100 mg BID  Secondary Hypercoagulable State (ICD10:  D68.69) The patient is at significant risk for stroke/thromboembolism based upon his CHA2DS2-VASc Score of 3.  Continue Apixaban  (Eliquis ). No bleeding issues.   High Risk Medication Monitoring (ICD 10: J342684) Patient requires ongoing monitoring for anti-arrhythmic medication which has the potential to cause life threatening arrhythmias. Intervals on ECG acceptable for amiodarone  monitoring.   Chronic HFrEF ICM EF 35-40% GDMT per [facility] team Fluid status appears stable today  CAD No anginal symptoms Followed by Dr  Rolan   Follow up with Daphne Barrack as scheduled.     [facility] [address] [place], [place] [address] [phone]

## 2024-07-14 ENCOUNTER — Other Ambulatory Visit (HOSPITAL_COMMUNITY): Payer: Self-pay

## 2024-07-16 ENCOUNTER — Other Ambulatory Visit (HOSPITAL_COMMUNITY): Payer: Self-pay

## 2024-07-17 ENCOUNTER — Other Ambulatory Visit: Payer: Self-pay

## 2024-07-17 ENCOUNTER — Ambulatory Visit (HOSPITAL_COMMUNITY)

## 2024-07-17 ENCOUNTER — Other Ambulatory Visit: Payer: Self-pay | Admitting: Cardiology

## 2024-07-17 ENCOUNTER — Other Ambulatory Visit (HOSPITAL_COMMUNITY): Payer: Self-pay

## 2024-07-17 ENCOUNTER — Encounter (HOSPITAL_COMMUNITY): Admitting: Cardiology

## 2024-07-17 MED ORDER — SACUBITRIL-VALSARTAN 49-51 MG PO TABS
1.0000 | ORAL_TABLET | Freq: Two times a day (BID) | ORAL | 5 refills | Status: DC
  Filled 2024-07-17 – 2024-07-25 (×2): qty 60, 30d supply, fill #0
  Filled 2024-08-17 – 2024-08-24 (×3): qty 60, 30d supply, fill #1

## 2024-07-19 ENCOUNTER — Ambulatory Visit (HOSPITAL_COMMUNITY): Admission: RE | Admit: 2024-07-19 | Discharge: 2024-07-19 | Attending: Cardiology | Admitting: Cardiology

## 2024-07-19 ENCOUNTER — Ambulatory Visit (HOSPITAL_COMMUNITY)
Admission: RE | Admit: 2024-07-19 | Discharge: 2024-07-19 | Attending: Physician Assistant | Admitting: Physician Assistant

## 2024-07-19 VITALS — BP 114/76 | HR 78 | Wt 225.0 lb

## 2024-07-19 DIAGNOSIS — I5022 Chronic systolic (congestive) heart failure: Secondary | ICD-10-CM

## 2024-07-19 LAB — LIPID PANEL
Cholesterol: 93 mg/dL (ref 0–200)
HDL: 27 mg/dL — ABNORMAL LOW (ref >40)
LDL Cholesterol: 41 mg/dL (ref 0–99)
Total CHOL/HDL Ratio: 3.4 ratio
Triglycerides: 123 mg/dL (ref <150)
VLDL: 25 mg/dL (ref 0–40)

## 2024-07-19 LAB — COMPREHENSIVE METABOLIC PANEL WITH GFR
ALT: 21 U/L (ref 0–44)
AST: 23 U/L (ref 15–41)
Albumin: 4 g/dL (ref 3.5–5.0)
Alkaline Phosphatase: 51 U/L (ref 38–126)
Anion gap: 8 (ref 5–15)
BUN: 9 mg/dL (ref 8–23)
CO2: 31 mmol/L (ref 22–32)
Calcium: 9.4 mg/dL (ref 8.9–10.3)
Chloride: 97 mmol/L — ABNORMAL LOW (ref 98–111)
Creatinine, Ser: 0.88 mg/dL (ref 0.61–1.24)
GFR, Estimated: >60 mL/min (ref >60)
Glucose, Bld: 84 mg/dL (ref 70–99)
Potassium: 3.9 mmol/L (ref 3.5–5.1)
Sodium: 136 mmol/L (ref 135–145)
Total Bilirubin: 0.7 mg/dL (ref 0.0–1.2)
Total Protein: 6.7 g/dL (ref 6.5–8.1)

## 2024-07-19 LAB — TSH: TSH: 2.276 u[IU]/mL (ref 0.350–4.500)

## 2024-07-19 LAB — ECHOCARDIOGRAM COMPLETE
AR max vel: 3.56 cm2
AV Area VTI: 3.38 cm2
AV Area mean vel: 3.44 cm2
AV Mean grad: 3 mmHg
AV Peak grad: 4.5 mmHg
Ao pk vel: 1.06 m/s
Area-P 1/2: 5.5 cm2
Calc EF: 46.2 %
S' Lateral: 3.7 cm
Single Plane A2C EF: 48.4 %
Single Plane A4C EF: 39.6 %

## 2024-07-19 MED ORDER — PERFLUTREN LIPID MICROSPHERE
1.0000 mL | INTRAVENOUS | Status: DC | PRN
  Administered 2024-07-19: 2 mL via INTRAVENOUS

## 2024-07-19 NOTE — Patient Instructions (Signed)
 There has been no changes to your medications.   Labs done today, your results will be available in MyChart, we will contact you for abnormal readings.  Your physician recommends that you schedule a follow-up appointment in: 3 months.  If you have any questions or concerns before your next appointment please send us  a message through [place] or call our office at [phone].    TO LEAVE A MESSAGE FOR THE NURSE SELECT OPTION 2, PLEASE LEAVE A MESSAGE INCLUDING: YOUR NAME DATE OF BIRTH CALL BACK NUMBER REASON FOR CALL**this is important as we prioritize the call backs  YOU WILL RECEIVE A CALL BACK THE SAME DAY AS LONG AS YOU CALL BEFORE 4:00 PM  At the [facility], you and your health needs are our priority. As part of our continuing mission to provide you with exceptional heart care, we have created designated Provider Care Teams. These Care Teams include your primary Cardiologist (physician) and Advanced Practice Providers (APPs- Physician Assistants and Nurse Practitioners) who all work together to provide you with the care you need, when you need it.   You may see any of the following providers on your designated Care Team at your next follow up: Dr Toribio Fuel Dr Ezra Shuck Dr. Morene Brownie Greig Mosses, NP Caffie Shed, [place] [facility] [place], [place] Beckey Coe, NP Jordan Lee, NP Ellouise Class, NP Tinnie Redman, PharmD Jaun Bash, PharmD   Please be sure to bring in all your medications bottles to every appointment.    Thank you for choosing [facility]

## 2024-07-20 ENCOUNTER — Ambulatory Visit (HOSPITAL_COMMUNITY): Payer: Self-pay | Admitting: Cardiology

## 2024-07-20 ENCOUNTER — Ambulatory Visit (HOSPITAL_COMMUNITY): Payer: Self-pay | Admitting: Physician Assistant

## 2024-07-20 ENCOUNTER — Encounter: Admitting: Cardiology

## 2024-07-20 NOTE — Progress Notes (Signed)
 [facility] CLINIC NOTE PCP: Valentin Skates, DO Cardiology: Dr. Shlomo HF Cardiology: Dr. Rolan Reason for Visit: Heart Failure and AFL Follow-up  Chief complaint: CHF  HPI: 68 y.o. with history of CAD, PAF, and ischemic cardiomyopathy. Patent was admitted to [facility] in 3/21 with CHF and atrial fibrillation/RVR. Echo showed EF 20-25%.  LHC showing occluded mid LAD with collaterals.  CMRI showed EF 22%, apical aneurysm, no significant LAD territory viability; therefore, patient did not have CABG or PCI. Patient was additionally found to have right MCA aneurysm. This was treated with embolization in 4/21. In 5/21, he had DCCV back to NSR. Repeat echo in 6/21 showed EF 20-25% with moderately decreased RV systolic function. St Jude ICD was placed. Patient has also been diagnosed with OSA and has started CPAP.   CPX 11/21 with no clear HF limitation.   Echo in 12/22 showed EF 35-40%, septal/apical akinesis, inferoapical dyskinesis, mild LV enlargement, RV normal, IVC normal.   He had an inappropriate ICD shock in 1/25 due to AF/RVR.   S/p successful TEE/DCCV on 01/17/24. TEE LV EF 30-35%, normal RV size/systolic function, mild-moderate TR, no LA appendage thrombus, trivial MR. Seen for post-procedure follow up 01/31/24 with recurrent AFL/RVR 122 bpm, asymptomatic. Referred to Afib Clinic and seen 02/21/24, he was referred to EP for ablation consultation.    Echo in 7/25 showed EF 30-35%, mild LVH, normal RV size/systolic function, IVC dilated.   He underwent Afib ablation by Dr. Inocencio 04/20/24.  Echo was done today and I reviewed, EF 45% with akinesis of the apical anterior/septal/inferior walls, normal RV size and systolic function, normal IVC.   He is here today for CHF follow-up. He is in NSR. Weight down 19 lbs.  He feels good, denies significant exertional dyspnea.  No chest pain.  No lightheadedness.  No orthopnea/PND.  Only complaint is recent gout flare.   Drinks a glass of red wine on  occasion. Occasionally has 1 cigarette when playing cards with a group. No regular tobacco.  ECG (personally reviewed): NSR, RBBB, LAFB  Labs (8/25): BNP 83, TSH normal, LFTs normal Labs (9/25): Hgb 14.2, K 5.1, creatinine 1.4  PMH: 1. Cerebral aneurysm: Right MCA aneurysm, embolized in 4/21 by IR.  2. Atrial fibrillation: Paroxysmal.  - DCCV 5/21 - Inappropriate ICD shock in 1/25 for AF/RVR.  - DCCV 6/25 with ERAF - Afib ablation 09/25 3. CAD: LHC (3/21) with totally occluded mid LAD with collaterals.  4. Chronic systolic CHF: Ischemic cardiomyopathy.   - Cardiac MRI (5/21): EF 22%, apical aneurysm, LAD territory scarring without evidence for significant viability. RV EF 31%.  - Echo (6/21): EF 20-25%, moderately decreased RV systolic function.  - St Jude ICD.  - CPX (11/21): peak VO2 17.9 (81% predicted), VE/VCO2 slope 25, RER 1.17 => no clear HF limitation.  - Echo (9/22): EF 35-40% with peri-apical akinesis and apical inferior aneurysm, no LV thrombus noted, mildly decreased RV systolic function, normal IVC.  - Echo (12/22): EF 35-40%, septal/apical akinesis, inferoapical dyskinesis, mild LV enlargement, RV normal, IVC normal - Echo (7/25): EF 30-35%, mild LVH, normal RV size/systolic function, IVC dilated.  - Echo (12/25): EF 45% with akinesis of the apical anterior/septal/inferior walls, normal RV size and systolic function, normal IVC.  5. Gout 6. OSA: Using CPAP.  7. Osteoarthritis 8. Depression/anxiety 9. CKD stage 3  Social History   Socioeconomic History   Marital status: Married    Spouse name: Not on file   Number of children:  0   Years of education: Not on file   Highest education level: Some college, no degree  Occupational History   Not on file  Tobacco Use   Smoking status: Former    Current packs/day: 0.00    Average packs/day: 0.3 packs/day for 3.0 years (0.8 ttl pk-yrs)    Types: Cigarettes    Start date: 08/16/2008    Quit date: 08/17/2011    Years  since quitting: 12.9   Smokeless tobacco: Never   Tobacco comments:    Former smoker 02/21/24  Vaping Use   Vaping status: Never Used  Substance and Sexual Activity   Alcohol  use: Yes    Alcohol /week: 2.0 standard drinks of alcohol     Types: 2 Glasses of wine per week   Drug use: No   Sexual activity: Yes  Other Topics Concern   Not on file  Social History Narrative   Not on file   Social Drivers of Health   Financial Resource Strain: Not on file  Food Insecurity: Not on file  Transportation Needs: Not on file  Physical Activity: Not on file  Stress: Not on file  Social Connections: Not on file  Intimate Partner Violence: Not on file   FH: No premature CAD or CHF.   ROS: All systems reviewed and negative except as per HPI.   Current Outpatient Medications  Medication Sig Dispense Refill   amiodarone  (PACERONE ) 200 MG tablet Take 1 tablet (200 mg total) by mouth daily. 90 tablet 3   apixaban  (ELIQUIS ) 5 MG TABS tablet Take 1 tablet (5 mg total) by mouth 2 (two) times daily. 180 tablet 3   colchicine 0.6 MG tablet Take 0.6 mg by mouth 2 (two) times daily as needed (for gout flares).      empagliflozin  (JARDIANCE ) 10 MG TABS tablet Take 1 tablet (10 mg total) by mouth daily. 30 tablet 11   furosemide  (LASIX ) 20 MG tablet Take 1 tablet (20 mg total) by mouth 2 (two) times daily. 60 tablet 6   Guaifenesin (MUCINEX MAXIMUM STRENGTH) 1200 MG TB12 Take 1 tablet by mouth every morning.     metoprolol  succinate (TOPROL -XL) 100 MG 24 hr tablet Take 1 tablet (100 mg total) by mouth 2 (two) times daily. Take with or immediately following a meal 60 tablet 6   mupirocin ointment (BACTROBAN) 2 % Apply 1 Application topically 2 (two) times daily as needed (wound care).     oxyCODONE -acetaminophen  (PERCOCET/ROXICET) 5-325 MG tablet Take 1 tablet by mouth every 8 (eight) hours as needed for moderate pain (pain score 4-6) or severe pain (pain score 7-10).     rosuvastatin  (CRESTOR ) 20 MG tablet  Take 1 tablet (20 mg total) by mouth daily. 30 tablet 6   sacubitril -valsartan  (ENTRESTO ) 49-51 MG Take 1 tablet by mouth 2 (two) times daily. 60 tablet 5   spironolactone  (ALDACTONE ) 25 MG tablet Take 1 tablet (25 mg total) by mouth daily. 30 tablet 6   tadalafil (CIALIS) 20 MG tablet Take 20 mg by mouth daily as needed for erectile dysfunction.     tirzepatide  (ZEPBOUND ) 12.5 MG/0.5ML Pen Inject 12.5 mg into the skin once a week. 2 mL 0   No current facility-administered medications for this encounter.   BP 114/76   Pulse 78   Wt 102.1 kg (225 lb)   SpO2 100%   BMI 29.69 kg/m   Wt Readings from Last 3 Encounters:  07/19/24 102.1 kg (225 lb)  07/11/24 101.5 kg (223 lb 12.8 oz)  04/30/24 110.9 kg (244 lb 6.4 oz)    PHYSICAL EXAM: General: NAD Neck: No JVD, no thyromegaly or thyroid  nodule.  Lungs: Clear to auscultation bilaterally with normal respiratory effort. CV: Nondisplaced PMI.  Heart regular S1/S2, no S3/S4, no murmur.  No peripheral edema.  No carotid bruit.  Normal pedal pulses.  Abdomen: Soft, nontender, no hepatosplenomegaly, no distention.  Skin: Intact without lesions or rashes.  Neurologic: Alert and oriented x 3.  Psych: Normal affect. Extremities: No clubbing or cyanosis.  HEENT: Normal.   Assessment/Plan: 1. CAD: LHC in 3/21 with occluded mid LAD with collaterals. CMRI was not suggestive of LAD territory viability, there was an apical aneurysm but no thrombus. Medical management. No chest pain. - He is not on ASA with Eliquis   - Goal LDL < 55, on Crestor  20 mg daily.  Check lipids today.  2. Atrial fibrillation/flutter: Paroxysmal.  S/p DCCV to NSR in 5/21.  Inappropriate ICD shock for AF/RVR in 1/25.  S/p DCCV on 01/17/24 with ERAF.  S/p AF ablation in 09/25.  In NSR today.  - Continue amiodarone  200 mg daily.  If he remains in NSR about 6 months post-ablation, think it would be reasonable to stop amiodarone  (3/26).  Check LFTs and TSH today, he will need a  regular eye exam.  - Continue Eliquis  5 mg bid.   - Compliant with CPAP 3. Chronic systolic CHF: Ischemic cardiomyopathy.  Echo in 6/21 with EF 20-25%, moderately decreased RV systolic function.  St Jude ICD. CPX in 11/21 with minimal HF limitation. Echo in 12/22 showed EF 35-40%, apical inferior aneurysm. Echo in 7/25 showed EF 30-35%, mild LVH, normal RV size/systolic function, IVC dilated.  Echo today showed EF up to 45% with apical akinesis and normal RV size/systolic function.  NYHA class I-II, volume looks good today - Continue Lasix  20 mg bid.    - Continue Entresto  49/51 bid. Last BMET showed upper normal K and creatinine 1.4, will not increase today.  Check BMET today.  - Continue spironolactone  25 mg daily.   - Continue Farxiga  10 mg daily.   - Continue Toprol  XL 100 mg bid.   4. Right MCA aneurysm: S/p embolization.  5. OSA: Reports compliance with CPAP. Continue. 6. Depression/anxiety:   - Continue sertraline . Managed by PCP  7. Obesity: Body mass index is 29.69 kg/m.  Significant weight loss with tirzepatide .  Continue.   Follow-up in 4 months with APP.   I spent 32 minutes reviewing chart, interacting with patient, reviewed LVAD parameters, and managing orders.   Ezra Shuck, MD 07/20/2024

## 2024-07-25 ENCOUNTER — Other Ambulatory Visit: Payer: Self-pay

## 2024-07-25 ENCOUNTER — Other Ambulatory Visit (HOSPITAL_COMMUNITY): Payer: Self-pay

## 2024-08-06 ENCOUNTER — Other Ambulatory Visit: Payer: Self-pay | Admitting: Pharmacist

## 2024-08-06 ENCOUNTER — Other Ambulatory Visit (HOSPITAL_COMMUNITY): Payer: Self-pay

## 2024-08-06 ENCOUNTER — Ambulatory Visit: Admitting: Pulmonary Disease

## 2024-08-07 ENCOUNTER — Other Ambulatory Visit: Payer: Self-pay

## 2024-08-07 MED ORDER — ZEPBOUND 15 MG/0.5ML ~~LOC~~ SOAJ
15.0000 mg | SUBCUTANEOUS | 11 refills | Status: AC
  Filled 2024-08-07: qty 2, 28d supply, fill #0
  Filled 2024-09-05 – 2024-09-10 (×2): qty 2, 28d supply, fill #1

## 2024-08-17 ENCOUNTER — Other Ambulatory Visit (HOSPITAL_COMMUNITY): Payer: Self-pay

## 2024-08-23 ENCOUNTER — Other Ambulatory Visit (HOSPITAL_COMMUNITY): Payer: Self-pay

## 2024-08-26 ENCOUNTER — Other Ambulatory Visit: Payer: Self-pay

## 2024-08-27 ENCOUNTER — Other Ambulatory Visit: Payer: Self-pay

## 2024-08-28 ENCOUNTER — Other Ambulatory Visit (HOSPITAL_COMMUNITY): Payer: Self-pay

## 2024-08-29 ENCOUNTER — Other Ambulatory Visit: Payer: Self-pay

## 2024-09-02 ENCOUNTER — Other Ambulatory Visit (HOSPITAL_COMMUNITY): Payer: Self-pay

## 2024-09-05 ENCOUNTER — Other Ambulatory Visit (HOSPITAL_COMMUNITY): Payer: Self-pay

## 2024-09-05 NOTE — Telephone Encounter (Signed)
 Patient called to say his Zepbound  is 500 dollars but you were able to get no charge for him last year?

## 2024-09-07 ENCOUNTER — Other Ambulatory Visit (HOSPITAL_COMMUNITY): Payer: Self-pay | Admitting: Cardiology

## 2024-09-07 ENCOUNTER — Other Ambulatory Visit: Payer: Self-pay

## 2024-09-07 ENCOUNTER — Other Ambulatory Visit (HOSPITAL_COMMUNITY): Payer: Self-pay

## 2024-09-07 ENCOUNTER — Other Ambulatory Visit: Payer: Self-pay | Admitting: Cardiology

## 2024-09-07 DIAGNOSIS — I5022 Chronic systolic (congestive) heart failure: Secondary | ICD-10-CM

## 2024-09-07 MED ORDER — ROSUVASTATIN CALCIUM 20 MG PO TABS
20.0000 mg | ORAL_TABLET | Freq: Every day | ORAL | 6 refills | Status: AC
  Filled 2024-09-07: qty 30, 30d supply, fill #0

## 2024-09-07 MED ORDER — EMPAGLIFLOZIN 10 MG PO TABS
10.0000 mg | ORAL_TABLET | Freq: Every day | ORAL | 11 refills | Status: AC
  Filled 2024-09-07: qty 30, 30d supply, fill #0

## 2024-09-07 MED ORDER — SACUBITRIL-VALSARTAN 49-51 MG PO TABS
1.0000 | ORAL_TABLET | Freq: Two times a day (BID) | ORAL | 5 refills | Status: AC
  Filled 2024-09-07: qty 60, 30d supply, fill #0

## 2024-09-07 MED ORDER — FUROSEMIDE 20 MG PO TABS
20.0000 mg | ORAL_TABLET | Freq: Two times a day (BID) | ORAL | 6 refills | Status: AC
  Filled 2024-09-07: qty 60, 30d supply, fill #0

## 2024-09-07 MED ORDER — SPIRONOLACTONE 25 MG PO TABS
25.0000 mg | ORAL_TABLET | Freq: Every day | ORAL | 6 refills | Status: AC
  Filled 2024-09-07: qty 30, 30d supply, fill #0

## 2024-09-07 MED ORDER — METOPROLOL SUCCINATE ER 100 MG PO TB24
100.0000 mg | ORAL_TABLET | Freq: Two times a day (BID) | ORAL | 6 refills | Status: AC
  Filled 2024-09-07: qty 60, 30d supply, fill #0

## 2024-09-10 ENCOUNTER — Other Ambulatory Visit (HOSPITAL_COMMUNITY): Payer: Self-pay

## 2024-09-10 ENCOUNTER — Other Ambulatory Visit: Payer: Self-pay

## 2024-09-10 ENCOUNTER — Encounter: Payer: Self-pay | Admitting: Pharmacy Technician

## 2024-09-10 NOTE — Telephone Encounter (Signed)
 I explained to patient the message below. He has a grant for his entresto  and jardiance . These both were filled. Will need to see if cost was high due to deductible or bc of copay with med. I have reached out to [facility]. Patient also educated about the medicare payment plan.

## 2024-09-11 ENCOUNTER — Other Ambulatory Visit (HOSPITAL_COMMUNITY): Payer: Self-pay

## 2024-09-11 NOTE — Telephone Encounter (Signed)
 Patient made aware NOT to stop his medications. A good portion of the 540 is going to his deductible. We talked about the medicare payment plan., but then realized with his grants that wouldn't be a good idea because he couldn't use them.  He will call [facility] and see if he can make payments on the copay.

## 2024-09-13 ENCOUNTER — Ambulatory Visit

## 2024-09-13 DIAGNOSIS — I5022 Chronic systolic (congestive) heart failure: Secondary | ICD-10-CM

## 2024-09-18 LAB — CUP PACEART REMOTE DEVICE CHECK
Battery Remaining Longevity: 71 mo
Battery Remaining Percentage: 58 %
Battery Voltage: 2.96 V
Brady Statistic RV Percent Paced: 1 %
Date Time Interrogation Session: 20260130220957
HighPow Impedance: 72 Ohm
Implantable Lead Connection Status: 753985
Implantable Lead Implant Date: 20210805
Implantable Lead Location: 753860
Implantable Pulse Generator Implant Date: 20210805
Lead Channel Impedance Value: 390 Ohm
Lead Channel Pacing Threshold Amplitude: 1 V
Lead Channel Pacing Threshold Pulse Width: 0.5 ms
Lead Channel Sensing Intrinsic Amplitude: 11.7 mV
Lead Channel Setting Pacing Amplitude: 2.5 V
Lead Channel Setting Pacing Pulse Width: 0.5 ms
Lead Channel Setting Sensing Sensitivity: 0.5 mV
Pulse Gen Serial Number: 111025604
Zone Setting Status: 755011

## 2024-09-20 NOTE — Progress Notes (Signed)
 Remote ICD Transmission

## 2024-09-24 ENCOUNTER — Ambulatory Visit: Admitting: Pulmonary Disease

## 2024-10-17 ENCOUNTER — Ambulatory Visit (HOSPITAL_COMMUNITY)

## 2024-12-13 ENCOUNTER — Ambulatory Visit

## 2025-03-14 ENCOUNTER — Ambulatory Visit

## 2025-06-13 ENCOUNTER — Ambulatory Visit

## 2025-09-12 ENCOUNTER — Ambulatory Visit

## 2025-12-12 ENCOUNTER — Ambulatory Visit

## 2026-03-13 ENCOUNTER — Ambulatory Visit

## 2026-06-12 ENCOUNTER — Ambulatory Visit
